# Patient Record
Sex: Male | Born: 1950 | Race: Black or African American | Hispanic: No | Smoking: Former smoker
Health system: Southern US, Community
[De-identification: ages and names within clinical notes are randomized; demographics above are authoritative.]

## PROBLEM LIST (undated history)

## (undated) DIAGNOSIS — I5040 Unspecified combined systolic (congestive) and diastolic (congestive) heart failure: Secondary | ICD-10-CM

## (undated) DIAGNOSIS — I472 Ventricular tachycardia: Secondary | ICD-10-CM

## (undated) DIAGNOSIS — J449 Chronic obstructive pulmonary disease, unspecified: Secondary | ICD-10-CM

## (undated) DIAGNOSIS — I4729 Other ventricular tachycardia: Secondary | ICD-10-CM

## (undated) DIAGNOSIS — Z8709 Personal history of other diseases of the respiratory system: Secondary | ICD-10-CM

## (undated) DIAGNOSIS — I420 Dilated cardiomyopathy: Secondary | ICD-10-CM

## (undated) DIAGNOSIS — C3490 Malignant neoplasm of unspecified part of unspecified bronchus or lung: Secondary | ICD-10-CM

## (undated) DIAGNOSIS — I5021 Acute systolic (congestive) heart failure: Secondary | ICD-10-CM

## (undated) DIAGNOSIS — E7800 Pure hypercholesterolemia, unspecified: Secondary | ICD-10-CM

## (undated) DIAGNOSIS — I2510 Atherosclerotic heart disease of native coronary artery without angina pectoris: Secondary | ICD-10-CM

## (undated) HISTORY — PX: HERNIA REPAIR: SHX51

## (undated) HISTORY — DX: Atherosclerotic heart disease of native coronary artery without angina pectoris: I25.10

## (undated) HISTORY — DX: Dilated cardiomyopathy: I42.0

## (undated) HISTORY — DX: Malignant neoplasm of unspecified part of unspecified bronchus or lung: C34.90

## (undated) HISTORY — DX: Acute systolic (congestive) heart failure: I50.21

## (undated) HISTORY — PX: OTHER SURGICAL HISTORY: SHX169

## (undated) HISTORY — PX: REPLANTATION THUMB: SUR1233

## (undated) HISTORY — DX: Ventricular tachycardia: I47.2

## (undated) HISTORY — DX: Personal history of other diseases of the respiratory system: Z87.09

## (undated) HISTORY — DX: Other ventricular tachycardia: I47.29

---

## 2016-03-29 DIAGNOSIS — C3490 Malignant neoplasm of unspecified part of unspecified bronchus or lung: Secondary | ICD-10-CM

## 2016-03-29 HISTORY — DX: Malignant neoplasm of unspecified part of unspecified bronchus or lung: C34.90

## 2017-11-13 ENCOUNTER — Emergency Department (HOSPITAL_COMMUNITY): Payer: Medicare HMO

## 2017-11-13 ENCOUNTER — Other Ambulatory Visit: Payer: Self-pay

## 2017-11-13 ENCOUNTER — Emergency Department (HOSPITAL_COMMUNITY)
Admission: EM | Admit: 2017-11-13 | Discharge: 2017-11-13 | Disposition: A | Discharge status: Home / Self Care | Payer: Medicare HMO | Attending: Emergency Medicine | Admitting: Emergency Medicine

## 2017-11-13 ENCOUNTER — Encounter (HOSPITAL_COMMUNITY): Payer: Self-pay | Admitting: *Deleted

## 2017-11-13 DIAGNOSIS — F17210 Nicotine dependence, cigarettes, uncomplicated: Secondary | ICD-10-CM | POA: Diagnosis not present

## 2017-11-13 DIAGNOSIS — Y9389 Activity, other specified: Secondary | ICD-10-CM | POA: Insufficient documentation

## 2017-11-13 DIAGNOSIS — Y929 Unspecified place or not applicable: Secondary | ICD-10-CM | POA: Insufficient documentation

## 2017-11-13 DIAGNOSIS — Y998 Other external cause status: Secondary | ICD-10-CM | POA: Diagnosis not present

## 2017-11-13 DIAGNOSIS — S4992XA Unspecified injury of left shoulder and upper arm, initial encounter: Secondary | ICD-10-CM | POA: Diagnosis present

## 2017-11-13 DIAGNOSIS — S42035A Nondisplaced fracture of lateral end of left clavicle, initial encounter for closed fracture: Secondary | ICD-10-CM | POA: Insufficient documentation

## 2017-11-13 DIAGNOSIS — W19XXXA Unspecified fall, initial encounter: Secondary | ICD-10-CM

## 2017-11-13 DIAGNOSIS — R55 Syncope and collapse: Secondary | ICD-10-CM | POA: Diagnosis not present

## 2017-11-13 MED ORDER — OXYCODONE-ACETAMINOPHEN 5-325 MG PO TABS
1.0000 | ORAL_TABLET | Freq: Once | ORAL | Status: AC
  Administered 2017-11-13: 1 via ORAL
  Filled 2017-11-13: qty 1

## 2017-11-13 MED ORDER — IBUPROFEN 600 MG PO TABS: 600.0000 mg | ORAL_TABLET | Freq: Four times a day (QID) | ORAL | 0 refills | Status: DC | PRN

## 2017-11-13 MED ORDER — HYDROCODONE-ACETAMINOPHEN 5-325 MG PO TABS: 1.0000 | ORAL_TABLET | ORAL | 0 refills | Status: DC | PRN

## 2017-11-13 NOTE — Discharge Instructions (Signed)
Follow up with your primary care physician for recheck in 1-2 weeks. Take ibuprofen for pain and Norco as needed for severe pain.

## 2017-11-13 NOTE — ED Notes (Signed)
Pt given [place] sandwich x2 and ginger ale
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
## 2017-11-13 NOTE — ED Notes (Signed)
E-signature not available, pt verbalized understanding of DC instructions and prescriptions 
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
## 2018-03-25 ENCOUNTER — Ambulatory Visit (HOSPITAL_COMMUNITY)
Admission: RE | Admit: 2018-03-25 | Discharge: 2018-03-25 | Disposition: A | Discharge status: Home / Self Care | Payer: Medicare HMO | Source: Ambulatory Visit | Attending: Internal Medicine | Admitting: Internal Medicine

## 2018-03-25 ENCOUNTER — Other Ambulatory Visit (HOSPITAL_COMMUNITY): Payer: Self-pay | Admitting: Internal Medicine

## 2018-03-25 DIAGNOSIS — C3491 Malignant neoplasm of unspecified part of right bronchus or lung: Secondary | ICD-10-CM

## 2018-04-01 ENCOUNTER — Other Ambulatory Visit: Payer: Self-pay | Admitting: Internal Medicine

## 2018-04-01 DIAGNOSIS — C3491 Malignant neoplasm of unspecified part of right bronchus or lung: Secondary | ICD-10-CM

## 2018-04-05 ENCOUNTER — Ambulatory Visit
Admission: RE | Admit: 2018-04-05 | Discharge: 2018-04-05 | Disposition: A | Discharge status: Home / Self Care | Payer: Medicare HMO | Source: Ambulatory Visit | Attending: Internal Medicine | Admitting: Internal Medicine

## 2018-04-05 DIAGNOSIS — C3491 Malignant neoplasm of unspecified part of right bronchus or lung: Secondary | ICD-10-CM

## 2018-04-05 MED ORDER — IOPAMIDOL (ISOVUE-300) INJECTION 61%
75.0000 mL | Freq: Once | INTRAVENOUS | Status: AC | PRN
  Administered 2018-04-05: 75 mL via INTRAVENOUS

## 2018-04-18 ENCOUNTER — Encounter: Payer: Self-pay | Admitting: Internal Medicine

## 2018-04-26 ENCOUNTER — Other Ambulatory Visit: Payer: Self-pay | Admitting: Medical Oncology

## 2018-04-26 DIAGNOSIS — R590 Localized enlarged lymph nodes: Secondary | ICD-10-CM

## 2018-04-29 ENCOUNTER — Inpatient Hospital Stay: Payer: Medicare HMO | Attending: Internal Medicine | Admitting: Internal Medicine

## 2018-04-29 ENCOUNTER — Encounter: Payer: Self-pay | Admitting: Internal Medicine

## 2018-04-29 ENCOUNTER — Encounter: Payer: Self-pay | Admitting: *Deleted

## 2018-04-29 ENCOUNTER — Inpatient Hospital Stay: Payer: Medicare HMO

## 2018-04-29 ENCOUNTER — Other Ambulatory Visit: Payer: Self-pay

## 2018-04-29 DIAGNOSIS — F17200 Nicotine dependence, unspecified, uncomplicated: Secondary | ICD-10-CM | POA: Insufficient documentation

## 2018-04-29 DIAGNOSIS — R918 Other nonspecific abnormal finding of lung field: Secondary | ICD-10-CM | POA: Insufficient documentation

## 2018-04-29 DIAGNOSIS — Z79899 Other long term (current) drug therapy: Secondary | ICD-10-CM | POA: Diagnosis not present

## 2018-04-29 DIAGNOSIS — C3490 Malignant neoplasm of unspecified part of unspecified bronchus or lung: Secondary | ICD-10-CM | POA: Insufficient documentation

## 2018-04-29 DIAGNOSIS — N400 Enlarged prostate without lower urinary tract symptoms: Secondary | ICD-10-CM | POA: Diagnosis not present

## 2018-04-29 DIAGNOSIS — Z923 Personal history of irradiation: Secondary | ICD-10-CM | POA: Diagnosis not present

## 2018-04-29 DIAGNOSIS — R590 Localized enlarged lymph nodes: Secondary | ICD-10-CM

## 2018-04-29 DIAGNOSIS — Z9221 Personal history of antineoplastic chemotherapy: Secondary | ICD-10-CM | POA: Insufficient documentation

## 2018-04-29 LAB — CMP (CANCER CENTER ONLY)
ALT: 13 U/L (ref 0–44)
AST: 18 U/L (ref 15–41)
Albumin: 3.7 g/dL (ref 3.5–5.0)
Alkaline Phosphatase: 80 U/L (ref 38–126)
Anion gap: 8 (ref 5–15)
BUN: 15 mg/dL (ref 8–23)
CO2: 30 mmol/L (ref 22–32)
Calcium: 9.5 mg/dL (ref 8.9–10.3)
Chloride: 106 mmol/L (ref 98–111)
Creatinine: 1.04 mg/dL (ref 0.61–1.24)
GFR, Est AFR Am: >60 mL/min (ref >60)
GFR, Estimated: >60 mL/min (ref >60)
Glucose, Bld: 102 mg/dL — ABNORMAL HIGH (ref 70–99)
Potassium: 4.3 mmol/L (ref 3.5–5.1)
Sodium: 144 mmol/L (ref 135–145)
Total Bilirubin: 0.7 mg/dL (ref 0.3–1.2)
Total Protein: 7.3 g/dL (ref 6.5–8.1)

## 2018-04-29 LAB — CBC WITH DIFFERENTIAL (CANCER CENTER ONLY)
Abs Immature Granulocytes: 0.03 10*3/uL (ref 0.00–0.07)
Basophils Absolute: 0.1 10*3/uL (ref 0.0–0.1)
Basophils Relative: 1 %
Eosinophils Absolute: 0.2 10*3/uL (ref 0.0–0.5)
Eosinophils Relative: 2 %
HCT: 46 % (ref 39.0–52.0)
Hemoglobin: 15.2 g/dL (ref 13.0–17.0)
Immature Granulocytes: 1 %
Lymphocytes Relative: 24 %
Lymphs Abs: 1.6 10*3/uL (ref 0.7–4.0)
MCH: 29.5 pg (ref 26.0–34.0)
MCHC: 33 g/dL (ref 30.0–36.0)
MCV: 89.1 fL (ref 80.0–100.0)
Monocytes Absolute: 0.5 10*3/uL (ref 0.1–1.0)
Monocytes Relative: 7 %
Neutro Abs: 4.2 10*3/uL (ref 1.7–7.7)
Neutrophils Relative %: 65 %
Platelet Count: 336 10*3/uL (ref 150–400)
RBC: 5.16 MIL/uL (ref 4.22–5.81)
RDW: 15.3 % (ref 11.5–15.5)
WBC Count: 6.5 10*3/uL (ref 4.0–10.5)
nRBC: 0 % (ref 0.0–0.2)

## 2018-04-29 NOTE — Progress Notes (Signed)
Oncology Nurse Navigator Documentation  Oncology Nurse Navigator Flowsheets 04/29/2018  Navigator Location [facility]  Navigator Encounter Type Clinic/MDC/Patient came to see Dr. Julien Nordmann for a new patient appt today.  He does not have any records with him but states he was treated with lung cancer in [place] at [facility]. I was able to get patient to sign a release of information form today.  I called referring office to get more information on patients past treatment.  They did not have any records.  I called [facility] and was unable to reach cancer center offices. I will update Dr. Julien Nordmann.   Treatment Phase Abnormal Scans  Barriers/Navigation Needs Education;Coordination of Care  Education Other  Interventions Coordination of Care;Education  Coordination of Care Other  Acuity Level 3  Time Spent with Patient 60

## 2018-04-29 NOTE — Progress Notes (Signed)
[facility] [phone]   [phone]  CONSULT NOTE  REFERRING PHYSICIAN: Dr. Mila Palmer  REASON FOR CONSULTATION:  67 years old African-American male with lung cancer.  HPI Jared Stevens is a 67 y.o. male with past medical history significant for hernia repair as well as or surgery and long history of smoking.  The patient mention that he was diagnosed with a stage III non-small cell lung cancer, adenocarcinoma in [place].  He had work-up including CT scan of the chest as well as PET scan and bronchoscopy that confirmed the diagnosis of a stage III non-small cell lung cancer.  He had MRI of the brain performed at that time that showed no evidence of metastatic disease to the brain.  Unfortunately the patient came with no records of his previous diagnosis and treatment.  He underwent a course of concurrent chemoradiation with weekly carboplatin and paclitaxel for a total of 35 fractions of radiation and 7 weeks of chemotherapy.  Repeat imaging studies after his treatment showed response to the treatment but the patient was not a surgical candidate for resection.  He did not have any further staging work-up since that time.  He moved to [facility] in October 2018.  The patient mentioned that he has a fall in June 2019.  Imaging studies including CT scan of the head as well as the cervical spine performed on 11/13/2017 showed no acute intracranial process and there was moderate left frontal scalp hematoma.  There was acute versus subacute left zygomatic arch fracture and old right zygomatic arch fracture and right medial orbital blowout fractures.  In October 2019 the patient was seen by primary care physician Dr. Marvel Plan.  Because of the previous history of lung cancer chest x-ray was performed on March 25, 2018 and that showed right infrahilar/lower lobe opacity with volume loss likely related to the patient is known lung cancer  with suspected posttreatment changes.  This was followed by CT scan of the chest on April 05, 2018 and that showed borderline mediastinal and right hilar lymphadenopathy with apparent posttreatment changes in the perihilar right lung.  There was 3.5 x 4.5 cm central masslike opacity in the right lung. The patient was referred to me today for evaluation and recommendation regarding his condition.  When seen today he continues to have numbness and aching pain in the chest, legs as well as ankles.  He also has shortness of breath with exertion and mild cough with no hemoptysis.  He lost around 30 pounds in the last year.  The patient denied having any headache or visual changes. Family history significant for mother died from aneurysm and father died from cancer at age 17. The patient is a widow and has 21 children.  Many of them live in [place] but one daughter lives in [place].  He is currently retired and used to work as Cabin crew.  He has a history of smoking 1 pack/day for around 53 years and unfortunately he continues to smoke.  The patient also has a history of alcohol and drug abuse in the past but not recently. HPI  No past medical history on file.  Past Surgical History:  Procedure Laterality Date  . Arm Surgery    . HERNIA REPAIR    . REPLANTATION THUMB      No family history on file.  Social History Social History   Tobacco Use  . Smoking status: Current Every Day Smoker  .  Smokeless tobacco: Never Used  Substance Use Topics  . Alcohol use: Not Currently  . Drug use: Not Currently    No Known Allergies  Current Outpatient Medications  Medication Sig Dispense Refill  . atorvastatin (LIPITOR) 20 MG tablet     . diclofenac sodium (VOLTAREN) 1 % GEL APP 4 GRAMS TOPICALLY AA QID FOR 30 DAYS  1  . HYDROcodone-acetaminophen (NORCO/VICODIN) 5-325 MG tablet Take 1 tablet by mouth every 4 (four) hours as needed. 8 tablet 0  . ibuprofen (ADVIL,MOTRIN) 600 MG tablet Take  1 tablet (600 mg total) by mouth every 6 (six) hours as needed. 30 tablet 0  . latanoprost (XALATAN) 0.005 % ophthalmic solution INT 1 GTT AEY BID  11  . naproxen (NAPROSYN) 500 MG tablet TK 1 T PO IN THE MORNING WF AND 1 T IN THE EVE WF  1  . SPIRIVA HANDIHALER 18 MCG inhalation capsule SPC  3  . VENTOLIN HFA 108 (90 Base) MCG/ACT inhaler INHALE 1 PUFF PO Q 6 H PRN. FOR 30 DAYS.  3  . WIXELA INHUB 250-50 MCG/DOSE AEPB      No current facility-administered medications for this visit.     Review of Systems  Constitutional: positive for fatigue and weight loss Eyes: negative Ears, nose, mouth, throat, and face: negative Respiratory: positive for cough and dyspnea on exertion Cardiovascular: negative Gastrointestinal: negative Genitourinary:negative Integument/breast: negative Hematologic/lymphatic: negative Musculoskeletal:positive for arthralgias Neurological: negative Behavioral/Psych: negative Endocrine: negative Allergic/Immunologic: negative  Physical Exam  TFT:DDUKG, healthy, no distress, well nourished and well developed SKIN: skin color, texture, turgor are normal, no rashes or significant lesions HEAD: Normocephalic, No masses, lesions, tenderness or abnormalities EYES: normal, PERRLA, Conjunctiva are pink and non-injected EARS: External ears normal, Canals clear OROPHARYNX:no exudate, no erythema and lips, buccal mucosa, and tongue normal  NECK: supple, no adenopathy, no JVD LYMPH:  no palpable lymphadenopathy, no hepatosplenomegaly LUNGS: clear to auscultation , and palpation HEART: regular rate & rhythm, no murmurs and no gallops ABDOMEN:abdomen soft, non-tender, normal bowel sounds and no masses or organomegaly BACK: Back symmetric, no curvature., No CVA tenderness EXTREMITIES:no joint deformities, effusion, or inflammation, no edema  NEURO: alert & oriented x 3 with fluent speech, no focal motor/sensory deficits  PERFORMANCE STATUS: ECOG 1  LABORATORY  DATA: Lab Results  Component Value Date   WBC 6.5 04/29/2018   HGB 15.2 04/29/2018   HCT 46.0 04/29/2018   MCV 89.1 04/29/2018   PLT 336 04/29/2018      Chemistry      Component Value Date/Time   NA 144 04/29/2018 1348   K 4.3 04/29/2018 1348   CL 106 04/29/2018 1348   CO2 30 04/29/2018 1348   BUN 15 04/29/2018 1348   CREATININE 1.04 04/29/2018 1348      Component Value Date/Time   CALCIUM 9.5 04/29/2018 1348   ALKPHOS 80 04/29/2018 1348   AST 18 04/29/2018 1348   ALT 13 04/29/2018 1348   BILITOT 0.7 04/29/2018 1348       RADIOGRAPHIC STUDIES: Ct Chest W Contrast  Result Date: 04/05/2018 CLINICAL DATA:  Cough and shortness of breath.  Lung cancer. EXAM: CT CHEST WITH CONTRAST TECHNIQUE: Multidetector CT imaging of the chest was performed during intravenous contrast administration. CONTRAST:  96mL ISOVUE-300 IOPAMIDOL (ISOVUE-300) INJECTION 61% COMPARISON:  Chest x-ray 03/25/2018. No previous CT imaging of the chest available. FINDINGS: Cardiovascular: Heart size within normal limits. No substantial pericardial effusion. There is abdominal aortic atherosclerosis without aneurysm. Mediastinum/Nodes: 9 mm short axis  AP window lymph node associated with small subcarinal lymph node. 12 mm short axis lymph node identified in the right hilum. Lungs/Pleura: There is abnormal soft tissue in the right hilum with an apparent 3.5 x 4.4 cm central right lung mass. Bronchiectasis with bandlike airspace disease in the right parahilar lung may be related to prior radiation fibrosis. Moderate right pleural effusion evident. Centrilobular emphysema noted. Upper Abdomen: 11 mm well-defined low-density lesion in the posterior right liver is likely a cyst. Other scattered tiny hypodensities in the liver parenchyma are too small to characterize. 17 mm low-density lesion in the interpolar right kidney is incompletely visualized but may be a cyst. Musculoskeletal: No worrisome lytic or sclerotic osseous  abnormality. IMPRESSION: 1. Borderline mediastinal and right hilar lymphadenopathy with apparent post treatment changes in the parahilar right lung. 3.5 x 4.5 cm central masslike opacity in the right lung. Direct comparison to prior imaging studies recommended as recurrent disease a concern. 2.  Emphysema. ([id number]) Electronically Signed   By: Misty Stanley M.D.   On: 04/05/2018 17:10    ASSESSMENT: This is a very pleasant 67 years old African-American male with history of a stage IIIa non-small cell lung cancer, adenocarcinoma status post a course of concurrent chemoradiation with weekly carboplatin and paclitaxel completed in early 2018.  The patient has been in observation since that time. Recent imaging studies showed right perihilar right lung treatment changes as well as borderline mediastinal and right hilar lymphadenopathy as well as central masslike opacity in the right lung.   PLAN: I had a lengthy discussion with the patient today about his current condition and treatment options. Unfortunately I do not have any records of his previous treatment and diagnosis from [place].  I will request his records to be available for [place] for future management of his condition. The current CT scan of the chest showed suspicious mass in the right lung as well as right hilar and mediastinal lymphadenopathy concerning for disease recurrence or residual posttreatment changes. I recommended for the patient to have a PET scan performed for further evaluation of this lesion and to rule out any disease recurrence. I will arrange for the patient to come back for follow-up visit in 3 weeks for evaluation and more detailed discussion of his treatment options based on the PET scan results and the previous records from [place]. For smoking cessation, I strongly encouraged the patient to quit smoking. The patient was advised to call immediately if he has any concerning symptoms in the interval. The patient  voices understanding of current disease status and treatment options and is in agreement with the current care plan.  All questions were answered. The patient knows to call the clinic with any problems, questions or concerns. We can certainly see the patient much sooner if necessary.  Thank you so much for allowing me to participate in the care of Jared Stevens. I will continue to follow up the patient with you and assist in his care.  I spent 40 minutes counseling the patient face to face. The total time spent in the appointment was 60 minutes.  Disclaimer: This note was dictated with voice recognition software. Similar sounding words can inadvertently be transcribed and may not be corrected upon review.   Eilleen Kempf April 29, 2018, 3:00 PM

## 2018-04-30 ENCOUNTER — Telehealth: Payer: Self-pay | Admitting: Internal Medicine

## 2018-04-30 NOTE — Telephone Encounter (Signed)
Scheduled appt per 12/2 los - pt is aware of appt date and time .

## 2018-05-06 ENCOUNTER — Encounter: Payer: Medicare HMO | Attending: Internal Medicine | Admitting: Dietician

## 2018-05-06 ENCOUNTER — Encounter: Payer: Self-pay | Admitting: Dietician

## 2018-05-06 DIAGNOSIS — Z713 Dietary counseling and surveillance: Secondary | ICD-10-CM | POA: Diagnosis present

## 2018-05-06 DIAGNOSIS — E46 Unspecified protein-calorie malnutrition: Secondary | ICD-10-CM | POA: Diagnosis not present

## 2018-05-06 DIAGNOSIS — Z681 Body mass index (BMI) 19 or less, adult: Secondary | ICD-10-CM | POA: Insufficient documentation

## 2018-05-06 DIAGNOSIS — R634 Abnormal weight loss: Secondary | ICD-10-CM | POA: Insufficient documentation

## 2018-05-06 NOTE — Patient Instructions (Addendum)
Drink 1-2 Ensure or Boost or other supplement per day [employer] mixed in while milk is the least expensive. You can put the shake in the blender with a banana if you wish. Add peanut butter to increase the calories.  Aim for 3 meals and 2 snacks daily  [facility] [employer] [facility]):  [phone]

## 2018-05-06 NOTE — Progress Notes (Signed)
Medical Nutrition Therapy:  Appt start time: 0810 end time:  0915.   Assessment:  Primary concerns today: Patient is here today alone.  His brother brought him but does not want to be part of the appointment.  He was referred for nutritional deficiency, abnormal weight loss and protein calorie malnutrition.  He was diagnosed with non-small cell lung cancer in [place] in November 2017 and underwent chemo and radiation.  He is to have a PET scan to determine his current status.  6 months ago he fell and broke his collar bone.  Weight hx: 146 lbs today- reports that he is eating well now but money for food is an issue at times.  He lost 15% of his UBW in the past year and has since regained about 9 lbs.  BMI currently 19. Lost from 160 to 137 lbs since moving to [place].  States that he wasn't eating. 168 lbs in [place]  160-165 lbs in normal weight. 210 lbs (2004-2006) is his highest adult weight but did not feel comfortable at this weight.  He lost with "stopping eating". He states that he did not lose weight with chemo and radiation.  Nutrition Focused Physical Exam performed: Mild wasting at the temporal region Severe muscle loss at the scapula and top of the shoulder. 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  Patient lives alone and moved from [place] 1 year ago to be closer to his daughter. He does his own shopping and cooking and does drive. His food stamps were cut and only receives $43 per month.  He is a retired [employer] and does some on the side now for extra money.  He has 9 children.  He is a widow.  Preferred Learning Style:   No preference indicated   Learning Readiness:   MEDICATIONS: see list   DIETARY INTAKE:  Usual eating pattern includes 3 meals and 1-2 snacks per day.  Everyday foods include.  Avoided foods include pork chops.    24-hr recall:  B ( AM): SKIPS OR sausage, eggs, pancakes OR oatmeal OR  raisin bran with whole milk  Snk ( AM): none L ( PM): 1 sandwich (ham, [place], with mayo), chips, fruit Snk ( PM): Hershey bar D ( PM): pinto or lima beans with [place] wings, necks, cornbread, greens or other vegetables Snk ( PM): occasional pie and whole milk Beverages: coffee with sugar, water, whole milk, gatorade, sweet tea  Usual physical activity: at work or walking  Estimated energy needs: 2100-2200 calories 75-85 g protein  Progress Towards Goal(s):  In progress.   Nutritional Diagnosis:  [place]-3.2 Unintentional weight loss As related to stress, chronic illness, food insecurity.  As evidenced by patient report and patient history.    Intervention:  Nutrition counseling/education on tips to increase calories and nutrition.  Discussed importance of a regular meal and snack schedule and supplements as needed.  Discussed food banks, local free meals and senior resources.  Provided supplement samples and coupons.  Drink 1-2 Ensure or Boost or other supplement per day [employer] mixed in while milk is the least expensive. You can put the shake in the blender with a banana if you wish. Add peanut butter to increase the calories. Aim for 3 meals and 2 snacks daily  Senior Resource Line [facility]):  [phone]  Teaching Method Utilized:  Auditory  Barriers to learning/adherence to lifestyle change: finances  Demonstrated degree of understanding via:  Teach Back   Monitoring/Evaluation:  Dietary intake, exercise, and body weight prn.

## 2018-05-10 ENCOUNTER — Telehealth: Payer: Self-pay | Admitting: *Deleted

## 2018-05-10 ENCOUNTER — Encounter: Payer: Self-pay | Admitting: *Deleted

## 2018-05-10 DIAGNOSIS — R590 Localized enlarged lymph nodes: Secondary | ICD-10-CM

## 2018-05-10 NOTE — Progress Notes (Signed)
I received a call back from [place] in [place] and was given a fax number to send record request and release of information.  Fax completed

## 2018-05-10 NOTE — Telephone Encounter (Signed)
Oncology Nurse Navigator Documentation  Oncology Nurse Navigator Flowsheets 05/10/2018  Navigator Location [facility]  Navigator Encounter Type Telephone/I followed up on Jared Stevens schedule and noted his PET scan is not scheduled yet.  I called patient to clarify and he is not aware of radiology calling him to schedule.  I called central scheduling and was able to get him an appt for PET before he sees Dr. Julien Nordmann on 05/27/18.  I needs help with transportation so I contacted the cancer centers transportation coordinator for help.    Telephone Outgoing Call  Treatment Phase Abnormal Scans  Barriers/Navigation Needs Education;Coordination of Care  Education Other  Interventions Coordination of Care;Education  Coordination of Care Appts  Education Method Verbal  Acuity Level 2  Time Spent with Patient 30

## 2018-05-10 NOTE — Progress Notes (Signed)
Oncology Nurse Navigator Documentation  Oncology Nurse Navigator Flowsheets 05/10/2018  Navigator Location [facility]  Navigator Encounter Type Other/I called [facility] in [place] to get recent medical records.  I was unable to reach anyone but did leave a vm message for them to call me with my name and phone number.   Treatment Phase Abnormal Scans  Barriers/Navigation Needs Coordination of Care  Interventions Coordination of Care  Coordination of Care Other  Acuity Level 1  Time Spent with Patient 15

## 2018-05-13 ENCOUNTER — Telehealth: Payer: Self-pay | Admitting: Internal Medicine

## 2018-05-13 NOTE — Telephone Encounter (Signed)
Set patient up with rides to his appointments here at the cancer center. Will have him sign a waiver on 12/26.

## 2018-05-23 ENCOUNTER — Inpatient Hospital Stay: Payer: Medicare HMO

## 2018-05-23 ENCOUNTER — Ambulatory Visit (HOSPITAL_COMMUNITY)
Admission: RE | Admit: 2018-05-23 | Discharge: 2018-05-23 | Disposition: A | Discharge status: Home / Self Care | Payer: Medicare HMO | Source: Ambulatory Visit | Attending: Internal Medicine | Admitting: Internal Medicine

## 2018-05-23 DIAGNOSIS — C3490 Malignant neoplasm of unspecified part of unspecified bronchus or lung: Secondary | ICD-10-CM

## 2018-05-23 DIAGNOSIS — R590 Localized enlarged lymph nodes: Secondary | ICD-10-CM

## 2018-05-23 LAB — CMP (CANCER CENTER ONLY)
ALT: 36 U/L (ref 0–44)
AST: 23 U/L (ref 15–41)
Albumin: 3.5 g/dL (ref 3.5–5.0)
Alkaline Phosphatase: 98 U/L (ref 38–126)
Anion gap: 8 (ref 5–15)
BUN: 17 mg/dL (ref 8–23)
CO2: 27 mmol/L (ref 22–32)
Calcium: 9.1 mg/dL (ref 8.9–10.3)
Chloride: 109 mmol/L (ref 98–111)
Creatinine: 1.03 mg/dL (ref 0.61–1.24)
GFR, Est AFR Am: >60 mL/min (ref >60)
GFR, Estimated: >60 mL/min (ref >60)
Glucose, Bld: 78 mg/dL (ref 70–99)
Potassium: 3.4 mmol/L — ABNORMAL LOW (ref 3.5–5.1)
Sodium: 144 mmol/L (ref 135–145)
Total Bilirubin: 0.3 mg/dL (ref 0.3–1.2)
Total Protein: 7 g/dL (ref 6.5–8.1)

## 2018-05-23 LAB — GLUCOSE, CAPILLARY: Glucose-Capillary: 78 mg/dL (ref 70–99)

## 2018-05-23 LAB — CBC WITH DIFFERENTIAL (CANCER CENTER ONLY)
Abs Immature Granulocytes: 0.02 10*3/uL (ref 0.00–0.07)
Basophils Absolute: 0.1 10*3/uL (ref 0.0–0.1)
Basophils Relative: 1 %
Eosinophils Absolute: 0.2 10*3/uL (ref 0.0–0.5)
Eosinophils Relative: 3 %
HCT: 45.4 % (ref 39.0–52.0)
Hemoglobin: 15.1 g/dL (ref 13.0–17.0)
Immature Granulocytes: 0 %
Lymphocytes Relative: 26 %
Lymphs Abs: 1.9 10*3/uL (ref 0.7–4.0)
MCH: 29.4 pg (ref 26.0–34.0)
MCHC: 33.3 g/dL (ref 30.0–36.0)
MCV: 88.5 fL (ref 80.0–100.0)
Monocytes Absolute: 0.6 10*3/uL (ref 0.1–1.0)
Monocytes Relative: 8 %
Neutro Abs: 4.6 10*3/uL (ref 1.7–7.7)
Neutrophils Relative %: 62 %
Platelet Count: 318 10*3/uL (ref 150–400)
RBC: 5.13 MIL/uL (ref 4.22–5.81)
RDW: 14.8 % (ref 11.5–15.5)
WBC Count: 7.4 10*3/uL (ref 4.0–10.5)
nRBC: 0 % (ref 0.0–0.2)

## 2018-05-23 MED ORDER — FLUDEOXYGLUCOSE F - 18 (FDG) INJECTION
7.2600 | Freq: Once | INTRAVENOUS | Status: AC
  Administered 2018-05-23: 7.26 via INTRAVENOUS

## 2018-05-27 ENCOUNTER — Encounter: Payer: Self-pay | Admitting: Internal Medicine

## 2018-05-27 ENCOUNTER — Inpatient Hospital Stay (HOSPITAL_BASED_OUTPATIENT_CLINIC_OR_DEPARTMENT_OTHER): Payer: Medicare HMO | Admitting: Internal Medicine

## 2018-05-27 ENCOUNTER — Telehealth: Payer: Self-pay | Admitting: Internal Medicine

## 2018-05-27 DIAGNOSIS — R918 Other nonspecific abnormal finding of lung field: Secondary | ICD-10-CM

## 2018-05-27 DIAGNOSIS — N400 Enlarged prostate without lower urinary tract symptoms: Secondary | ICD-10-CM | POA: Diagnosis not present

## 2018-05-27 DIAGNOSIS — C3490 Malignant neoplasm of unspecified part of unspecified bronchus or lung: Secondary | ICD-10-CM

## 2018-05-27 DIAGNOSIS — F17200 Nicotine dependence, unspecified, uncomplicated: Secondary | ICD-10-CM | POA: Diagnosis not present

## 2018-05-27 DIAGNOSIS — Z79899 Other long term (current) drug therapy: Secondary | ICD-10-CM

## 2018-05-27 DIAGNOSIS — Z9221 Personal history of antineoplastic chemotherapy: Secondary | ICD-10-CM

## 2018-05-27 DIAGNOSIS — Z923 Personal history of irradiation: Secondary | ICD-10-CM

## 2018-05-27 NOTE — Progress Notes (Signed)
[facility] [phone]   [phone]  OFFICE PROGRESS NOTE  Patient, No Pcp Per No address on file  DIAGNOSIS: Stage IIIA non-small cell lung cancer, adenocarcinoma   PRIOR THERAPY: status post a course of concurrent chemoradiation with weekly carboplatin and paclitaxel completed in early 2018.  The patient has been in observation since that time.  CURRENT THERAPY: Observation.  INTERVAL HISTORY: Jared Stevens 67 y.o. male returns to the clinic today for follow-up visit.  The patient is feeling fine today with no concerning complaints except for intermittent low back pain.  He denied having any chest pain but has shortness of breath with exertion with no cough or hemoptysis.  He has no recent weight loss or night sweats.  He has no nausea, vomiting, diarrhea or constipation.  He was found on previous CT scan of the chest to have suspicious lesion and the right lung.  I ordered a PET scan which was performed recently and the patient is here today for evaluation and discussion of his PET scan results and recommendation regarding his condition.  MEDICAL HISTORY: Past Medical History:  Diagnosis Date  . Lung cancer ([place]) 03/2016    ALLERGIES:  has No Known Allergies.  MEDICATIONS:  Current Outpatient Medications  Medication Sig Dispense Refill  . atorvastatin (LIPITOR) 20 MG tablet     . diclofenac sodium (VOLTAREN) 1 % GEL APP 4 GRAMS TOPICALLY AA QID FOR 30 DAYS  1  . HYDROcodone-acetaminophen (NORCO/VICODIN) 5-325 MG tablet Take 1 tablet by mouth every 4 (four) hours as needed. (Patient not taking: Reported on 05/06/2018) 8 tablet 0  . ibuprofen (ADVIL,MOTRIN) 600 MG tablet Take 1 tablet (600 mg total) by mouth every 6 (six) hours as needed. (Patient not taking: Reported on 05/06/2018) 30 tablet 0  . latanoprost (XALATAN) 0.005 % ophthalmic solution INT 1 GTT AEY BID  11  . naproxen (NAPROSYN) 500 MG tablet TK 1 T PO IN THE MORNING WF AND 1 T IN  THE EVE WF  1  . SPIRIVA HANDIHALER 18 MCG inhalation capsule SPC  3  . VENTOLIN HFA 108 (90 Base) MCG/ACT inhaler INHALE 1 PUFF PO Q 6 H PRN. FOR 30 DAYS.  3  . WIXELA INHUB 250-50 MCG/DOSE AEPB      No current facility-administered medications for this visit.     SURGICAL HISTORY:  Past Surgical History:  Procedure Laterality Date  . Arm Surgery    . HERNIA REPAIR    . REPLANTATION THUMB      REVIEW OF SYSTEMS:  A comprehensive review of systems was negative except for: Constitutional: positive for fatigue Respiratory: positive for dyspnea on exertion Musculoskeletal: positive for back pain   PHYSICAL EXAMINATION: General appearance: alert, cooperative, fatigued and no distress Head: Normocephalic, without obvious abnormality, atraumatic Neck: no adenopathy, no JVD, supple, symmetrical, trachea midline and thyroid not enlarged, symmetric, no tenderness/mass/nodules Lymph nodes: Cervical, supraclavicular, and axillary nodes normal. Resp: clear to auscultation bilaterally Back: symmetric, no curvature. ROM normal. No CVA tenderness. Cardio: regular rate and rhythm, S1, S2 normal, no murmur, click, rub or gallop GI: soft, non-tender; bowel sounds normal; no masses,  no organomegaly Extremities: extremities normal, atraumatic, no cyanosis or edema  ECOG PERFORMANCE STATUS: 1 - Symptomatic but completely ambulatory  Blood pressure 134/89, pulse (!) 101, temperature 97.7 F (36.5 C), temperature source Oral, resp. rate 17, height 6\' 1"  (1.854 m), weight 148 lb 8 oz (67.4 kg), SpO2 98 %.  LABORATORY  DATA: Lab Results  Component Value Date   WBC 7.4 05/23/2018   HGB 15.1 05/23/2018   HCT 45.4 05/23/2018   MCV 88.5 05/23/2018   PLT 318 05/23/2018      Chemistry      Component Value Date/Time   NA 144 05/23/2018 0822   K 3.4 (L) 05/23/2018 0822   CL 109 05/23/2018 0822   CO2 27 05/23/2018 0822   BUN 17 05/23/2018 0822   CREATININE 1.03 05/23/2018 0822      Component  Value Date/Time   CALCIUM 9.1 05/23/2018 0822   ALKPHOS 98 05/23/2018 0822   AST 23 05/23/2018 0822   ALT 36 05/23/2018 0822   BILITOT 0.3 05/23/2018 0822       RADIOGRAPHIC STUDIES: Nm Pet Image Restag (ps) Skull Base To Thigh  Result Date: 05/23/2018 CLINICAL DATA:  Subsequent treatment strategy for lung carcinoma. Non small cell carcinoma EXAM: NUCLEAR MEDICINE PET SKULL BASE TO THIGH TECHNIQUE: 7.26 mCi F-18 FDG was injected intravenously. Full-ring PET imaging was performed from the skull base to thigh after the radiotracer. CT data was obtained and used for attenuation correction and anatomic localization. Fasting blood glucose: 74 mg/dl COMPARISON:  04/05/2018 FINDINGS: Mediastinal blood pool activity: SUV max 1.56 NECK: No hypermetabolic lymph nodes in the neck. Incidental CT findings: none CHEST: Large volume of pleural fluid in the RIGHT hemithorax not changed from prior. There is atelectasis of the RIGHT middle lobe. There is rounded mass along the RIGHT mediastinal border measuring 4.2 by 3.5 cm. This lesion has peripheral high density and no significant metabolic activity most suggestive of rounded atelectasis. There are no hypermetabolic hilar lymph nodes. No hypermetabolic mediastinal lymph nodes. No hypermetabolic supraclavicular nodes Incidental CT findings: none ABDOMEN/PELVIS: There is diffuse hypermetabolic activity through the gastric fundus, body and antrum without focal lesion on the CT portion exam. Activity is intense with SUV max equal 7.7. No abnormal hypermetabolic activity within the liver, pancreas, adrenal glands, or spleen. No hypermetabolic lymph nodes in the abdomen or pelvis. Incidental CT findings: Prostate is enlarged SKELETON: No focal hypermetabolic activity to suggest skeletal metastasis. Incidental CT findings: none IMPRESSION: 1. No evidence lung cancer recurrence. 2. Rounded mass along the RIGHT mediastinum has absent metabolic activity and favored round  atelectasis. No metastatic mediastinal adenopathy. 3. Large layering RIGHT pleural effusion and RIGHT middle lobe atelectasis. 4. Diffuse hypermetabolic activity throughout the stomach without mass lesion on CT. Favor gastritis over malignancy. Electronically Signed   By: Suzy Bouchard M.D.   On: 05/23/2018 11:23    ASSESSMENT AND PLAN: This is a very pleasant 67 years old African-American male with history of a stage IIIa non-small cell lung cancer, adenocarcinoma status post a course of concurrent chemoradiation in [place] in 2017. The patient is currently on observation.  Recent CT scan of the chest showed borderline mediastinal and right hilar lymphadenopathy as well as right lung central masslike opacity. I ordered a PET scan for evaluation of this lesion.  I personally and independently reviewed the scan images and discussed the result with the patient today.  His a scan showed no evidence for lung cancer recurrence and there was rounded mass along the right mediastinum with absent metabolic activity favored round atelectasis.  There was no metastatic mediastinal lymphadenopathy. I recommended for the patient to continue on observation with repeat CT scan of the chest in 3 months for further evaluation of his disease. The patient was advised to call immediately if he has any concerning  symptoms in the interval. The patient voices understanding of current disease status and treatment options and is in agreement with the current care plan.  All questions were answered. The patient knows to call the clinic with any problems, questions or concerns. We can certainly see the patient much sooner if necessary.  I spent 10 minutes counseling the patient face to face. The total time spent in the appointment was 15 minutes.  Disclaimer: This note was dictated with voice recognition software. Similar sounding words can inadvertently be transcribed and may not be corrected upon review.

## 2018-05-27 NOTE — Telephone Encounter (Signed)
Gave patient avs report and appointments for March. Central radiology will call re scan.

## 2018-06-23 ENCOUNTER — Ambulatory Visit (HOSPITAL_COMMUNITY)
Admission: EM | Admit: 2018-06-23 | Discharge: 2018-06-23 | Disposition: A | Discharge status: Home / Self Care | Payer: Medicare HMO | Attending: Family Medicine | Admitting: Family Medicine

## 2018-06-23 ENCOUNTER — Other Ambulatory Visit: Payer: Self-pay

## 2018-06-23 ENCOUNTER — Encounter (HOSPITAL_COMMUNITY): Payer: Self-pay | Admitting: *Deleted

## 2018-06-23 ENCOUNTER — Ambulatory Visit (INDEPENDENT_AMBULATORY_CARE_PROVIDER_SITE_OTHER): Payer: Medicare HMO

## 2018-06-23 DIAGNOSIS — J441 Chronic obstructive pulmonary disease with (acute) exacerbation: Secondary | ICD-10-CM | POA: Diagnosis not present

## 2018-06-23 DIAGNOSIS — Z85118 Personal history of other malignant neoplasm of bronchus and lung: Secondary | ICD-10-CM | POA: Diagnosis not present

## 2018-06-23 HISTORY — DX: Pure hypercholesterolemia, unspecified: E78.00

## 2018-06-23 HISTORY — DX: Chronic obstructive pulmonary disease, unspecified: J44.9

## 2018-06-23 MED ORDER — IPRATROPIUM-ALBUTEROL 0.5-2.5 (3) MG/3ML IN SOLN
3.0000 mL | Freq: Once | RESPIRATORY_TRACT | Status: AC
  Administered 2018-06-23: 3 mL via RESPIRATORY_TRACT

## 2018-06-23 MED ORDER — PREDNISONE 10 MG PO TABS: 20.0000 mg | ORAL_TABLET | Freq: Every day | ORAL | 0 refills | Status: DC

## 2018-06-23 MED ORDER — ACETAMINOPHEN 325 MG PO TABS
ORAL_TABLET | ORAL | Status: AC
  Filled 2018-06-23: qty 2

## 2018-06-23 MED ORDER — IPRATROPIUM-ALBUTEROL 0.5-2.5 (3) MG/3ML IN SOLN
RESPIRATORY_TRACT | Status: AC
  Filled 2018-06-23: qty 3

## 2018-06-23 MED ORDER — DOXYCYCLINE HYCLATE 100 MG PO CAPS: 100.0000 mg | ORAL_CAPSULE | Freq: Two times a day (BID) | ORAL | 0 refills | Status: DC

## 2018-06-23 MED ORDER — ACETAMINOPHEN 325 MG PO TABS
650.0000 mg | ORAL_TABLET | Freq: Once | ORAL | Status: AC
  Administered 2018-06-23: 650 mg via ORAL

## 2018-06-23 NOTE — ED Triage Notes (Signed)
C/O cough, congestion, SOB with exertion x 3 days.  Unsure if fevers.  Hx COPD without O2 use.  Audible wheezing noted.

## 2018-06-23 NOTE — ED Notes (Signed)
Providers notified of pt.

## 2018-06-23 NOTE — ED Provider Notes (Signed)
[facility]    CSN: [id number] Arrival date & time: 06/23/18  1111     History   Chief Complaint Chief Complaint  Patient presents with  . Cough  . Nasal Congestion    HPI Jared Stevens is a 68 y.o. male.   Patient has a history of COPD as well as lung cancer.  Had been on albuterol as well as a lab.  Cough is productive of sputum is white.  Denies fever or chills.  Chief complaint is just increasing shortness of breath.  HPI  Past Medical History:  Diagnosis Date  . COPD (chronic obstructive pulmonary disease) ([place])   . Hypercholesteremia   . Lung cancer ([place]) 03/2016    There are no active problems to display for this patient.   Past Surgical History:  Procedure Laterality Date  . Arm Surgery    . HERNIA REPAIR    . REPLANTATION THUMB         Home Medications    Prior to Admission medications   Medication Sig Start Date End Date Taking? Authorizing Provider  atorvastatin (LIPITOR) 20 MG tablet  04/22/18  Yes [provider]  latanoprost (XALATAN) 0.005 % ophthalmic solution INT 1 GTT AEY BID 03/12/18  Yes [provider]  SPIRIVA HANDIHALER 18 MCG inhalation capsule [facility] 04/10/18  Yes [provider]  VENTOLIN HFA 108 (90 Base) MCG/ACT inhaler INHALE 1 PUFF PO Q 6 H PRN. FOR 30 DAYS. 04/11/18  Yes [provider]  diclofenac sodium (VOLTAREN) 1 % GEL APP 4 GRAMS TOPICALLY AA QID FOR 30 DAYS 04/01/18   [provider]  HYDROcodone-acetaminophen (NORCO/VICODIN) 5-325 MG tablet Take 1 tablet by mouth every 4 (four) hours as needed. Patient not taking: Reported on 05/06/2018 11/13/17   Charlann Lange, PA-C  ibuprofen (ADVIL,MOTRIN) 600 MG tablet Take 1 tablet (600 mg total) by mouth every 6 (six) hours as needed. Patient not taking: Reported on 05/06/2018 11/13/17   Charlann Lange, PA-C  naproxen (NAPROSYN) 500 MG tablet TK 1 T PO IN THE MORNING WF AND 1 T IN THE EVE WF 04/11/18   [provider]  Delfin Edis 250-50 MCG/DOSE AEPB  04/22/18   [provider]    Family History History reviewed. No pertinent family history.  Social History Social History   Tobacco Use  . Smoking status: Current Some Day Smoker  . Smokeless tobacco: Never Used  Substance Use Topics  . Alcohol use: Not Currently  . Drug use: Not Currently     Allergies   Patient has no known allergies.   Review of Systems Review of Systems  Constitutional: Negative.   Respiratory: Positive for cough and shortness of breath.      Physical Exam Triage Vital Signs ED Triage Vitals  Enc Vitals Group     BP 06/23/18 1205 134/88     Pulse Rate 06/23/18 1205 (!) 114     Resp 06/23/18 1205 (!) 22     Temp 06/23/18 1205 (!) 101.9 F (38.8 C)     Temp Source 06/23/18 1205 Temporal     SpO2 06/23/18 1205 91 %     Weight --      Height --      Head Circumference --      Peak Flow --      Pain Score 06/23/18 1206 8     Pain Loc --      Pain Edu? --      Excl. in [place]? --  No data found.  Updated Vital Signs BP 134/88   Pulse (!) 114   Temp (!) 101.9 F (38.8 C) (Temporal)   Resp (!) 22   SpO2 91%   Visual Acuity Right Eye Distance:   Left Eye Distance:   Bilateral Distance:    Right Eye Near:   Left Eye Near:    Bilateral Near:     Physical Exam Constitutional:      Appearance: Normal appearance. He is ill-appearing.  HENT:     Nose: Nose normal.     Mouth/Throat:     Pharynx: Oropharynx is clear.  Cardiovascular:     Rate and Rhythm: Normal rate and regular rhythm.  Pulmonary:     Effort: Pulmonary effort is normal.     Breath sounds: Wheezing and rhonchi present.  Abdominal:     General: Bowel sounds are normal.     Palpations: Abdomen is soft.  Neurological:     Mental Status: He is alert.      UC Treatments / Results  Labs (all labs ordered are listed, but only abnormal results are displayed) Labs Reviewed - No data to display  EKG None  Radiology No  results found.  Procedures Procedures (including critical care time)  Medications Ordered in UC Medications  acetaminophen (TYLENOL) tablet 650 mg (has no administration in time range)  ipratropium-albuterol (DUONEB) 0.5-2.5 (3) MG/3ML nebulizer solution 3 mL (has no administration in time range)    Initial Impression / Assessment and Plan / UC Course  I have reviewed the triage vital signs and the nursing notes.  Pertinent labs & imaging results that were available during my care of the patient were reviewed by me and considered in my medical decision making (see chart for details).     COPD exacerbation.  With fever was worried about pneumonia but x-ray fails to demonstrate.  We will go ahead and cover with antibiotic and steroids.  Discussed with patient possibility of hospitalization and he prefers not to go that route Final Clinical Impressions(s) / UC Diagnoses   Final diagnoses:  None   Discharge Instructions   None    ED Prescriptions    None     Controlled Substance Prescriptions [place] Controlled Substance Registry consulted? No   Wardell Honour, MD 06/23/18 1314

## 2018-08-20 ENCOUNTER — Other Ambulatory Visit: Payer: Self-pay

## 2018-08-20 ENCOUNTER — Ambulatory Visit (HOSPITAL_COMMUNITY)
Admission: EM | Admit: 2018-08-20 | Discharge: 2018-08-20 | Disposition: A | Discharge status: Home / Self Care | Payer: Medicare HMO | Attending: Family Medicine | Admitting: Family Medicine

## 2018-08-20 ENCOUNTER — Encounter (HOSPITAL_COMMUNITY): Payer: Self-pay

## 2018-08-20 DIAGNOSIS — T451X5A Adverse effect of antineoplastic and immunosuppressive drugs, initial encounter: Secondary | ICD-10-CM

## 2018-08-20 DIAGNOSIS — J441 Chronic obstructive pulmonary disease with (acute) exacerbation: Secondary | ICD-10-CM

## 2018-08-20 DIAGNOSIS — F17210 Nicotine dependence, cigarettes, uncomplicated: Secondary | ICD-10-CM | POA: Diagnosis not present

## 2018-08-20 DIAGNOSIS — G620 Drug-induced polyneuropathy: Secondary | ICD-10-CM

## 2018-08-20 DIAGNOSIS — Z9189 Other specified personal risk factors, not elsewhere classified: Secondary | ICD-10-CM

## 2018-08-20 MED ORDER — VARENICLINE TARTRATE 0.5 MG PO TABS: 0.5000 mg | ORAL_TABLET | Freq: Two times a day (BID) | ORAL | 3 refills | Status: DC

## 2018-08-20 MED ORDER — DEXAMETHASONE 1 MG/ML PO CONC
10.0000 mg | Freq: Once | ORAL | Status: AC
  Administered 2018-08-20: 10 mg via ORAL

## 2018-08-20 MED ORDER — AZITHROMYCIN 250 MG PO TABS
1000.0000 mg | ORAL_TABLET | Freq: Once | ORAL | Status: AC
  Administered 2018-08-20: 1000 mg via ORAL

## 2018-08-20 MED ORDER — AZITHROMYCIN 250 MG PO TABS
ORAL_TABLET | ORAL | Status: AC
  Filled 2018-08-20: qty 4

## 2018-08-20 MED ORDER — DEXAMETHASONE SODIUM PHOSPHATE 10 MG/ML IJ SOLN
INTRAMUSCULAR | Status: AC
  Filled 2018-08-20: qty 1

## 2018-08-20 NOTE — ED Provider Notes (Signed)
[place]    CSN: [id number] Arrival date & time: 08/20/18  1546     History   Chief Complaint Chief Complaint  Patient presents with  . Cough    HPI Jared Stevens is a 68 y.o. male.   68 year old man who is been seen here 2 months ago for an exacerbation of his COPD.  At that time he was treated with steroids and antibiotics.  Presents today with 4 days of progressive cough.  He called his primary care doctor who told him to take Mucinex and if not better, come to urgent care.  He has had no fever or sore throat.  He has had no chest pain.  The cough is productive and he is wheezing more than usual.  Patient has a history of adenocarcinoma of the right middle lobe.  This was diagnosed 3 years ago.  He also notes that he has numbness in both hands since he finished his chemotherapy and broke his collarbone last year.  Patient would like to quit smoking.  He is requesting a prescription for Chantix.      Past Medical History:  Diagnosis Date  . COPD (chronic obstructive pulmonary disease) ([place])   . Hypercholesteremia   . Lung cancer ([place]) 03/2016    There are no active problems to display for this patient.   Past Surgical History:  Procedure Laterality Date  . Arm Surgery    . HERNIA REPAIR    . REPLANTATION THUMB         Home Medications    Prior to Admission medications   Medication Sig Start Date End Date Taking? Authorizing Provider  atorvastatin (LIPITOR) 20 MG tablet  04/22/18   [provider]  diclofenac sodium (VOLTAREN) 1 % GEL APP 4 GRAMS TOPICALLY AA QID FOR 30 DAYS 04/01/18   [provider]  doxycycline (VIBRAMYCIN) 100 MG capsule Take 1 capsule (100 mg total) by mouth 2 (two) times daily. 06/23/18   Wardell Honour, MD  HYDROcodone-acetaminophen (NORCO/VICODIN) 5-325 MG tablet Take 1 tablet by mouth every 4 (four) hours as needed. Patient not taking: Reported on 05/06/2018 11/13/17   Charlann Lange, PA-C  ibuprofen  (ADVIL,MOTRIN) 600 MG tablet Take 1 tablet (600 mg total) by mouth every 6 (six) hours as needed. Patient not taking: Reported on 05/06/2018 11/13/17   Charlann Lange, PA-C  latanoprost (XALATAN) 0.005 % ophthalmic solution INT 1 GTT AEY BID 03/12/18   [provider]  naproxen (NAPROSYN) 500 MG tablet TK 1 T PO IN THE MORNING WF AND 1 T IN THE EVE WF 04/11/18   [provider]  predniSONE (DELTASONE) 10 MG tablet Take 2 tablets (20 mg total) by mouth daily. 06/23/18   Wardell Honour, MD  SPIRIVA HANDIHALER 18 MCG inhalation capsule [facility] 04/10/18   [provider]  varenicline (CHANTIX) 0.5 MG tablet Take 1 tablet (0.5 mg total) by mouth 2 (two) times daily. 08/20/18   Robyn Haber, MD  VENTOLIN HFA 108 (90 Base) MCG/ACT inhaler INHALE 1 PUFF PO Q 6 H PRN. FOR 30 DAYS. 04/11/18   [provider]  Delfin Edis 952-84 MCG/DOSE AEPB  04/22/18   [provider]    Family History History reviewed. No pertinent family history.  Social History Social History   Tobacco Use  . Smoking status: Current Some Day Smoker  . Smokeless tobacco: Never Used  Substance Use Topics  . Alcohol use: Not Currently  . Drug use: Not Currently  Allergies   Patient has no known allergies.   Review of Systems Review of Systems  Constitutional: Positive for appetite change, fatigue and unexpected weight change. Negative for activity change, chills, diaphoresis and fever.  HENT: Negative.   Respiratory: Positive for cough and wheezing.   Cardiovascular: Negative for chest pain.  Gastrointestinal: Negative.   Genitourinary: Negative.   Neurological: Positive for numbness.  All other systems reviewed and are negative.    Physical Exam Triage Vital Signs ED Triage Vitals  Enc Vitals Group     BP 08/20/18 1606 (!) 126/96     Pulse Rate 08/20/18 1606 (!) 109     Resp 08/20/18 1606 18     Temp 08/20/18 1606 97.7 F (36.5 C)     Temp Source 08/20/18  1606 Oral     SpO2 08/20/18 1606 100 %     Weight 08/20/18 1605 150 lb (68 kg)     Height --      Head Circumference --      Peak Flow --      Pain Score 08/20/18 1605 1     Pain Loc --      Pain Edu? --      Excl. in [place]? --    No data found.  Updated Vital Signs BP (!) 126/96 (BP Location: Left Arm)   Pulse (!) 109   Temp 97.7 F (36.5 C) (Oral)   Resp 18   Wt 68 kg   SpO2 100%   BMI 19.79 kg/m    Physical Exam Vitals signs and nursing note reviewed.  Constitutional:      Appearance: Normal appearance.  HENT:     Head: Normocephalic.     Right Ear: External ear normal.     Left Ear: External ear normal.     Nose: Nose normal.     Mouth/Throat:     Pharynx: Oropharynx is clear.     Comments: Almost completely edentulous Eyes:     Conjunctiva/sclera: Conjunctivae normal.  Neck:     Musculoskeletal: Normal range of motion and neck supple.  Cardiovascular:     Rate and Rhythm: Regular rhythm. Tachycardia present.     Pulses: Normal pulses.     Heart sounds: Normal heart sounds.  Pulmonary:     Effort: Pulmonary effort is normal.     Breath sounds: Wheezing and rales present.     Comments: Rales most notably on the right both anterior and posteriorly.  He has inspiratory and expiratory wheezing in the right anterior chest.  Breath sounds are diminished on the right anterior chest. Musculoskeletal: Normal range of motion.  Skin:    General: Skin is warm and dry.  Neurological:     General: No focal deficit present.     Mental Status: He is alert and oriented to person, place, and time.  Psychiatric:        Mood and Affect: Mood normal.      UC Treatments / Results  Labs (all labs ordered are listed, but only abnormal results are displayed) Labs Reviewed - No data to display  EKG None  Radiology No results found.  Procedures Procedures (including critical care time)  Medications Ordered in UC Medications  azithromycin (ZITHROMAX) tablet 1,000 mg  (has no administration in time range)  dexamethasone (DECADRON) 1 MG/ML solution 10 mg (has no administration in time range)    Initial Impression / Assessment and Plan / UC Course  I have reviewed the triage vital signs and  the nursing notes.  Pertinent labs & imaging results that were available during my care of the patient were reviewed by me and considered in my medical decision making (see chart for details).    Final Clinical Impressions(s) / UC Diagnoses   Final diagnoses:  COPD exacerbation ([place])  Continuous dependence on cigarette smoking  Poor dental hygiene  Peripheral neuropathy due to chemotherapy [facility])     Discharge Instructions     Try taking Vitamin B6 (pyridoxine) 50 mg three times a day for a month to help with the numbness.  I called in Chantix to help you quit smoking  Call your doctor if symptoms persist.    ED Prescriptions    Medication Sig Dispense Auth. Provider   varenicline (CHANTIX) 0.5 MG tablet Take 1 tablet (0.5 mg total) by mouth 2 (two) times daily. 60 tablet Robyn Haber, MD     Controlled Substance Prescriptions [place] Controlled Substance Registry consulted? Not Applicable   Robyn Haber, MD 08/20/18 [phone]

## 2018-08-20 NOTE — ED Triage Notes (Signed)
Travel none fever none Pt cc coughing x 4 days. Pt states she uses Simbcort.

## 2018-08-20 NOTE — ED Notes (Signed)
Patient verbalizes understanding of discharge instructions. Opportunity for questioning and answers were provided. Patient discharged from [facility] by RN.

## 2018-08-20 NOTE — Discharge Instructions (Addendum)
Try taking Vitamin B6 (pyridoxine) 50 mg three times a day for a month to help with the numbness.  I called in Chantix to help you quit smoking  Call your doctor if symptoms persist.

## 2018-08-23 ENCOUNTER — Other Ambulatory Visit: Payer: Self-pay

## 2018-08-23 ENCOUNTER — Other Ambulatory Visit: Payer: Medicare HMO

## 2018-08-23 ENCOUNTER — Ambulatory Visit (HOSPITAL_COMMUNITY)
Admission: RE | Admit: 2018-08-23 | Discharge: 2018-08-23 | Disposition: A | Discharge status: Home / Self Care | Payer: Medicare HMO | Source: Ambulatory Visit | Attending: Internal Medicine | Admitting: Internal Medicine

## 2018-08-23 ENCOUNTER — Inpatient Hospital Stay: Payer: Medicare HMO | Attending: Internal Medicine

## 2018-08-23 DIAGNOSIS — J90 Pleural effusion, not elsewhere classified: Secondary | ICD-10-CM | POA: Insufficient documentation

## 2018-08-23 DIAGNOSIS — C3490 Malignant neoplasm of unspecified part of unspecified bronchus or lung: Secondary | ICD-10-CM | POA: Diagnosis not present

## 2018-08-23 DIAGNOSIS — Z85118 Personal history of other malignant neoplasm of bronchus and lung: Secondary | ICD-10-CM | POA: Insufficient documentation

## 2018-08-23 LAB — CMP (CANCER CENTER ONLY)
ALT: 46 U/L — ABNORMAL HIGH (ref 0–44)
AST: 34 U/L (ref 15–41)
Albumin: 3.2 g/dL — ABNORMAL LOW (ref 3.5–5.0)
Alkaline Phosphatase: 95 U/L (ref 38–126)
Anion gap: 11 (ref 5–15)
BUN: 19 mg/dL (ref 8–23)
CO2: 27 mmol/L (ref 22–32)
Calcium: 9.2 mg/dL (ref 8.9–10.3)
Chloride: 107 mmol/L (ref 98–111)
Creatinine: 0.82 mg/dL (ref 0.61–1.24)
GFR, Est AFR Am: >60 mL/min (ref >60)
GFR, Estimated: >60 mL/min (ref >60)
Glucose, Bld: 94 mg/dL (ref 70–99)
Potassium: 4.2 mmol/L (ref 3.5–5.1)
Sodium: 145 mmol/L (ref 135–145)
Total Bilirubin: 0.4 mg/dL (ref 0.3–1.2)
Total Protein: 7 g/dL (ref 6.5–8.1)

## 2018-08-23 LAB — CBC WITH DIFFERENTIAL (CANCER CENTER ONLY)
Abs Immature Granulocytes: 0.03 10*3/uL (ref 0.00–0.07)
Basophils Absolute: 0.1 10*3/uL (ref 0.0–0.1)
Basophils Relative: 1 %
Eosinophils Absolute: 0.1 10*3/uL (ref 0.0–0.5)
Eosinophils Relative: 1 %
HCT: 42.7 % (ref 39.0–52.0)
Hemoglobin: 13.6 g/dL (ref 13.0–17.0)
Immature Granulocytes: 0 %
Lymphocytes Relative: 21 %
Lymphs Abs: 1.6 10*3/uL (ref 0.7–4.0)
MCH: 29 pg (ref 26.0–34.0)
MCHC: 31.9 g/dL (ref 30.0–36.0)
MCV: 91 fL (ref 80.0–100.0)
Monocytes Absolute: 0.5 10*3/uL (ref 0.1–1.0)
Monocytes Relative: 6 %
Neutro Abs: 5.3 10*3/uL (ref 1.7–7.7)
Neutrophils Relative %: 71 %
Platelet Count: 415 10*3/uL — ABNORMAL HIGH (ref 150–400)
RBC: 4.69 MIL/uL (ref 4.22–5.81)
RDW: 16.3 % — ABNORMAL HIGH (ref 11.5–15.5)
WBC Count: 7.5 10*3/uL (ref 4.0–10.5)
nRBC: 0 % (ref 0.0–0.2)

## 2018-08-23 MED ORDER — SODIUM CHLORIDE (PF) 0.9 % IJ SOLN
INTRAMUSCULAR | Status: AC
  Filled 2018-08-23: qty 50

## 2018-08-23 MED ORDER — IOHEXOL 300 MG/ML  SOLN
75.0000 mL | Freq: Once | INTRAMUSCULAR | Status: AC | PRN
  Administered 2018-08-23: 75 mL via INTRAVENOUS

## 2018-08-26 ENCOUNTER — Telehealth: Payer: Self-pay | Admitting: Medical Oncology

## 2018-08-26 ENCOUNTER — Inpatient Hospital Stay (HOSPITAL_BASED_OUTPATIENT_CLINIC_OR_DEPARTMENT_OTHER): Payer: Medicare HMO | Admitting: Internal Medicine

## 2018-08-26 DIAGNOSIS — C3490 Malignant neoplasm of unspecified part of unspecified bronchus or lung: Secondary | ICD-10-CM

## 2018-08-26 DIAGNOSIS — C3491 Malignant neoplasm of unspecified part of right bronchus or lung: Secondary | ICD-10-CM | POA: Insufficient documentation

## 2018-08-26 MED ORDER — DOXYCYCLINE HYCLATE 100 MG PO TABS: 100.0000 mg | ORAL_TABLET | Freq: Two times a day (BID) | ORAL | 0 refills | Status: DC

## 2018-08-26 NOTE — Telephone Encounter (Signed)
Apt changed to virtual visit

## 2018-08-26 NOTE — Progress Notes (Signed)
Virtual Visit via Telephone Note  I connected with Jared Stevens on 08/26/18 at  1:45 PM EDT by telephone and verified that I am speaking with the correct person using two identifiers.   I discussed the limitations, risks, security and privacy concerns of performing an evaluation and management service by telephone and the availability of in person appointments. I also discussed with the patient that there may be a patient responsible charge related to this service. The patient expressed understanding and agreed to proceed.  DIAGNOSIS: Stage IIIA non-small cell lung cancer, adenocarcinoma   PRIOR THERAPY: status post a course of concurrent chemoradiation with weekly carboplatin and paclitaxel completed in early 2018.  The patient has been in observation since that time.  CURRENT THERAPY: Observation.  History of Present Illness: Jared Stevens 68 y.o. male was evaluated by telephone virtual visit today for his routine follow-up visit.  The patient is feeling fine today with no concerning complaints except for shortness of breath increased with exertion.  He was seen recently at the emergency department and treated with a course of antibiotics and steroids.  He still have shortness of breath with cough productive of whitish sputum.  He denied having any fever or chills.  He has no nausea, vomiting, diarrhea or constipation.  He has no headache or visual changes.  He denied having any recent weight loss or night sweats.   MEDICAL HISTORY: Past Medical History:  Diagnosis Date  . COPD (chronic obstructive pulmonary disease) ([place])   . Hypercholesteremia   . Lung cancer ([place]) 03/2016    ALLERGIES:  has No Known Allergies.  MEDICATIONS:  Current Outpatient Medications  Medication Sig Dispense Refill  . atorvastatin (LIPITOR) 20 MG tablet     . diclofenac sodium (VOLTAREN) 1 % GEL APP 4 GRAMS TOPICALLY AA QID FOR 30 DAYS  1  . doxycycline (VIBRAMYCIN) 100 MG capsule Take 1 capsule (100 mg  total) by mouth 2 (two) times daily. 20 capsule 0  . HYDROcodone-acetaminophen (NORCO/VICODIN) 5-325 MG tablet Take 1 tablet by mouth every 4 (four) hours as needed. (Patient not taking: Reported on 05/06/2018) 8 tablet 0  . ibuprofen (ADVIL,MOTRIN) 600 MG tablet Take 1 tablet (600 mg total) by mouth every 6 (six) hours as needed. (Patient not taking: Reported on 05/06/2018) 30 tablet 0  . latanoprost (XALATAN) 0.005 % ophthalmic solution INT 1 GTT AEY BID  11  . naproxen (NAPROSYN) 500 MG tablet TK 1 T PO IN THE MORNING WF AND 1 T IN THE EVE WF  1  . predniSONE (DELTASONE) 10 MG tablet Take 2 tablets (20 mg total) by mouth daily. 15 tablet 0  . SPIRIVA HANDIHALER 18 MCG inhalation capsule SPC  3  . varenicline (CHANTIX) 0.5 MG tablet Take 1 tablet (0.5 mg total) by mouth 2 (two) times daily. 60 tablet 3  . VENTOLIN HFA 108 (90 Base) MCG/ACT inhaler INHALE 1 PUFF PO Q 6 H PRN. FOR 30 DAYS.  3  . WIXELA INHUB 250-50 MCG/DOSE AEPB      No current facility-administered medications for this visit.     SURGICAL HISTORY:  Past Surgical History:  Procedure Laterality Date  . Arm Surgery    . HERNIA REPAIR    . REPLANTATION THUMB      REVIEW OF SYSTEMS:  A comprehensive review of systems was negative except for: Constitutional: positive for fatigue Respiratory: positive for cough, dyspnea on exertion and sputum   LABORATORY DATA: Lab Results  Component Value Date  WBC 7.5 08/23/2018   HGB 13.6 08/23/2018   HCT 42.7 08/23/2018   MCV 91.0 08/23/2018   PLT 415 (H) 08/23/2018      Chemistry      Component Value Date/Time   NA 145 08/23/2018 1205   K 4.2 08/23/2018 1205   CL 107 08/23/2018 1205   CO2 27 08/23/2018 1205   BUN 19 08/23/2018 1205   CREATININE 0.82 08/23/2018 1205      Component Value Date/Time   CALCIUM 9.2 08/23/2018 1205   ALKPHOS 95 08/23/2018 1205   AST 34 08/23/2018 1205   ALT 46 (H) 08/23/2018 1205   BILITOT 0.4 08/23/2018 1205       RADIOGRAPHIC  STUDIES: Ct Chest W Contrast  Result Date: 08/23/2018 CLINICAL DATA:  Stage IIIA right lung adenocarcinoma status post chemotherapy and radiation therapy completed early 2018. Interval observation. Restaging. EXAM: CT CHEST WITH CONTRAST TECHNIQUE: Multidetector CT imaging of the chest was performed during intravenous contrast administration. CONTRAST:  4mL OMNIPAQUE IOHEXOL 300 MG/ML  SOLN COMPARISON:  05/23/2018 PET-CT.  04/05/2018 chest CT. FINDINGS: Cardiovascular: Top-normal heart size. No significant pericardial effusion/thickening. Atherosclerotic nonaneurysmal thoracic aorta. Normal caliber pulmonary arteries. No central pulmonary emboli. Mediastinum/Nodes: No discrete thyroid nodules. Unremarkable esophagus. No pathologically enlarged axillary, mediastinal or hilar lymph nodes. Lungs/Pleura: No pneumothorax. Large dependent right pleural effusion increased from 05/23/2018 PET-CT. No left pleural effusion. Moderate centrilobular emphysema. Nodular subpleural medial right upper lobe 1.1 cm focus of consolidation (series 5/image 95), stable since 04/05/2018 chest CT. Increased compressive atelectasis throughout the dependent right lung. Masslike sharply marginated right perihilar consolidation with associated volume loss and distortion, similar. The previously described lower-attenuation 4.4 x 3.5 cm right middle lobe masslike focus of consolidation measures 4.4 x 3.3 cm (series 2/image 109), not appreciably changed. New 3.3 x 2.1 cm centrally cavitary masslike focus of consolidation in the anterior left lower lobe (series 5/image 130). No additional significant pulmonary nodules. Upper abdomen: Scattered subcentimeter hypodense liver lesions, too small to characterize, not appreciably changed. Subcentimeter hypodense posterior upper left renal cortical lesion, too small to characterize, unchanged. Musculoskeletal: No aggressive appearing focal osseous lesions. Mild thoracic spondylosis. IMPRESSION: 1. New  centrally cavitary 3.3 cm masslike focus of consolidation in the anterior left lower lung lobe. Given the rapid appearance since 05/23/2018 PET-CT, cavitary infection is favored, although neoplasm is not excluded. Recommend attention on short-term follow-up chest CT in 1-2 months. 2. Large right pleural effusion, increased. 3. Masslike post treatment changes in the perihilar right lung are not appreciably changed. 4. No discrete thoracic adenopathy. Aortic Atherosclerosis (ICD10-I70.0) and Emphysema (ICD10-J43.9). Electronically Signed   By: Ilona Sorrel M.D.   On: 08/23/2018 16:10   Assessment and Plan: This is a very pleasant 68 years old African-American male with history of a stage IIIa non-small cell lung cancer, adenocarcinoma status post a course of concurrent chemoradiation in [place] in 2017. The patient is currently on observation and he is feeling fine except for the shortness of breath that started a week ago. Repeat CT scan of the chest performed recently showed new centrally cavitary masslike consolidation in the anterior left lower lobe suspicious for inflammatory process.  The patient also has increase in the size of the large right pleural effusion and no change in the perihilar right lung consolidation after his radiotherapy. I personally and independently reviewed the scan images and discussed the results with the patient today. I recommended for him to continue on observation with repeat CT scan of the  chest in 3 months for restaging of his disease. For the recent inflammatory process in the left lung, I gave the patient prescription for doxycycline 100 mg p.o. twice daily for 10 days. The patient was advised to call immediately if he has any concerning symptoms or worsening of his dyspnea.  Follow Up Instructions: Follow-up visit in 3 months with repeat lab and scan.   I discussed the assessment and treatment plan with the patient. The patient was provided an opportunity to ask  questions and all were answered. The patient agreed with the plan and demonstrated an understanding of the instructions.   The patient was advised to call back or seek an in-person evaluation if the symptoms worsen or if the condition fails to improve as anticipated.  I provided 15 minutes of non-face-to-face time during this encounter.   Eilleen Kempf, MD

## 2018-08-27 ENCOUNTER — Telehealth: Payer: Self-pay | Admitting: Internal Medicine

## 2018-08-27 NOTE — Telephone Encounter (Signed)
Scheduled appt per 3/30 sch message - sent reminder letter in the mail - central radiology to contact patient with ct appt.

## 2018-09-05 ENCOUNTER — Emergency Department (HOSPITAL_COMMUNITY): Payer: Medicare HMO

## 2018-09-05 ENCOUNTER — Emergency Department (HOSPITAL_COMMUNITY)
Admission: EM | Admit: 2018-09-05 | Discharge: 2018-09-05 | Disposition: A | Discharge status: Home / Self Care | Payer: Medicare HMO | Attending: Emergency Medicine | Admitting: Emergency Medicine

## 2018-09-05 ENCOUNTER — Other Ambulatory Visit: Payer: Self-pay

## 2018-09-05 DIAGNOSIS — J449 Chronic obstructive pulmonary disease, unspecified: Secondary | ICD-10-CM | POA: Diagnosis not present

## 2018-09-05 DIAGNOSIS — C3480 Malignant neoplasm of overlapping sites of unspecified bronchus and lung: Secondary | ICD-10-CM | POA: Diagnosis not present

## 2018-09-05 DIAGNOSIS — F17210 Nicotine dependence, cigarettes, uncomplicated: Secondary | ICD-10-CM | POA: Insufficient documentation

## 2018-09-05 DIAGNOSIS — Z79899 Other long term (current) drug therapy: Secondary | ICD-10-CM | POA: Insufficient documentation

## 2018-09-05 DIAGNOSIS — J90 Pleural effusion, not elsewhere classified: Secondary | ICD-10-CM | POA: Insufficient documentation

## 2018-09-05 DIAGNOSIS — R0602 Shortness of breath: Secondary | ICD-10-CM | POA: Diagnosis present

## 2018-09-05 LAB — CBC WITH DIFFERENTIAL/PLATELET
Abs Immature Granulocytes: 0.03 10*3/uL (ref 0.00–0.07)
Basophils Absolute: 0.1 10*3/uL (ref 0.0–0.1)
Basophils Relative: 1 %
Eosinophils Absolute: 0.1 10*3/uL (ref 0.0–0.5)
Eosinophils Relative: 1 %
HCT: 45.5 % (ref 39.0–52.0)
Hemoglobin: 14.7 g/dL (ref 13.0–17.0)
Immature Granulocytes: 0 %
Lymphocytes Relative: 17 %
Lymphs Abs: 1.5 10*3/uL (ref 0.7–4.0)
MCH: 29.1 pg (ref 26.0–34.0)
MCHC: 32.3 g/dL (ref 30.0–36.0)
MCV: 90.1 fL (ref 80.0–100.0)
Monocytes Absolute: 0.6 10*3/uL (ref 0.1–1.0)
Monocytes Relative: 6 %
Neutro Abs: 6.5 10*3/uL (ref 1.7–7.7)
Neutrophils Relative %: 75 %
Platelets: 372 10*3/uL (ref 150–400)
RBC: 5.05 MIL/uL (ref 4.22–5.81)
RDW: 17.1 % — ABNORMAL HIGH (ref 11.5–15.5)
WBC: 8.7 10*3/uL (ref 4.0–10.5)
nRBC: 0 % (ref 0.0–0.2)

## 2018-09-05 LAB — COMPREHENSIVE METABOLIC PANEL
ALT: 41 U/L (ref 0–44)
AST: 45 U/L — ABNORMAL HIGH (ref 15–41)
Albumin: 3.2 g/dL — ABNORMAL LOW (ref 3.5–5.0)
Alkaline Phosphatase: 83 U/L (ref 38–126)
Anion gap: 11 (ref 5–15)
BUN: 13 mg/dL (ref 8–23)
CO2: 25 mmol/L (ref 22–32)
Calcium: 9 mg/dL (ref 8.9–10.3)
Chloride: 105 mmol/L (ref 98–111)
Creatinine, Ser: 0.83 mg/dL (ref 0.61–1.24)
GFR calc Af Amer: >60 mL/min (ref >60)
GFR calc non Af Amer: >60 mL/min (ref >60)
Glucose, Bld: 93 mg/dL (ref 70–99)
Potassium: 4.2 mmol/L (ref 3.5–5.1)
Sodium: 141 mmol/L (ref 135–145)
Total Bilirubin: 0.6 mg/dL (ref 0.3–1.2)
Total Protein: 6.2 g/dL — ABNORMAL LOW (ref 6.5–8.1)

## 2018-09-05 LAB — TROPONIN I: Troponin I: <0.03 ng/mL (ref <0.03)

## 2018-09-05 LAB — URINALYSIS, ROUTINE W REFLEX MICROSCOPIC
Bilirubin Urine: NEGATIVE
Glucose, UA: NEGATIVE mg/dL
Hgb urine dipstick: NEGATIVE
Ketones, ur: NEGATIVE mg/dL
Leukocytes,Ua: NEGATIVE
Nitrite: NEGATIVE
Protein, ur: 30 mg/dL — AB
Specific Gravity, Urine: 1.023 (ref 1.005–1.030)
pH: 5 (ref 5.0–8.0)

## 2018-09-05 LAB — BODY FLUID CELL COUNT WITH DIFFERENTIAL
Eos, Fluid: 0 %
Lymphs, Fluid: 56 %
Monocyte-Macrophage-Serous Fluid: 8 % — ABNORMAL LOW (ref 50–90)
Neutrophil Count, Fluid: 36 % — ABNORMAL HIGH (ref 0–25)
Total Nucleated Cell Count, Fluid: 520 cu mm (ref 0–1000)

## 2018-09-05 LAB — LACTATE DEHYDROGENASE, PLEURAL OR PERITONEAL FLUID: LD, Fluid: 73 U/L — ABNORMAL HIGH (ref 3–23)

## 2018-09-05 LAB — BRAIN NATRIURETIC PEPTIDE: B Natriuretic Peptide: 1010.6 pg/mL — ABNORMAL HIGH (ref 0.0–100.0)

## 2018-09-05 LAB — GLUCOSE, PLEURAL OR PERITONEAL FLUID: Glucose, Fluid: 96 mg/dL

## 2018-09-05 LAB — LACTIC ACID, PLASMA: Lactic Acid, Venous: 1.4 mmol/L (ref 0.5–1.9)

## 2018-09-05 LAB — PROTEIN, PLEURAL OR PERITONEAL FLUID: Total protein, fluid: <3 g/dL

## 2018-09-05 LAB — LACTATE DEHYDROGENASE: LDH: 227 U/L — ABNORMAL HIGH (ref 98–192)

## 2018-09-05 MED ORDER — LIDOCAINE HCL (PF) 1 % IJ SOLN
5.0000 mL | Freq: Once | INTRAMUSCULAR | Status: AC
  Administered 2018-09-05: 17:00:00 5 mL
  Filled 2018-09-05: qty 5

## 2018-09-05 MED ORDER — IOPAMIDOL (ISOVUE-300) INJECTION 61%
75.0000 mL | Freq: Once | INTRAVENOUS | Status: AC | PRN
  Administered 2018-09-05: 75 mL via INTRAVENOUS

## 2018-09-05 NOTE — ED Notes (Signed)
Culture sent with UA 

## 2018-09-05 NOTE — ED Notes (Signed)
Patient verbalizes understanding of discharge instructions. Opportunity for questioning and answers were provided. Armband removed by staff, pt discharged from ED.  

## 2018-09-05 NOTE — ED Notes (Signed)
Pt tolerated procedure well. He is eating a snack.

## 2018-09-05 NOTE — ED Triage Notes (Signed)
Pt reports shortness of breath that started around 0100 this morning. Pt reports that his inhaler mediation was recently switched and he has been having trouble with his COPD since then. Per EMS pt was 95% on room air. Pt was assisted with 8 puffs of his MDI by EMS. Pt initially reports some chest tightness but that was relieved following the albuterol. Pt also reports increased leg swelling. Pt denying any pain at present time. Pt is 98% on room air. Pt denying contact with anyone who was sick or any fevers at home. Pt does report a productive cough.

## 2018-09-05 NOTE — Discharge Instructions (Addendum)
1.  Call your oncologist and your doctor to schedule a recheck as soon as possible. 2.  Return to the emergency department if you have worsening shortness of breath, fever, cough, sudden chest pain or other concerning symptoms.

## 2018-09-05 NOTE — ED Notes (Signed)
Items requested have been placed at bedside. Dr Johnney Killian at bedside.

## 2018-09-05 NOTE — ED Provider Notes (Signed)
[facility] EMERGENCY DEPARTMENT Provider Note   CSN: [id number] Arrival date & time: 09/05/18  1601    History   Chief Complaint Chief Complaint  Patient presents with   Shortness of Breath    HPI Jared Stevens is a 68 y.o. male.     HPI Reports no significant change since being treated with a azithromycin and prednisone at the end of March through urgent care.  Followed up with oncology Dr. Julien Nordmann 3\30 for a follow-up.  Patient's antibiotics extended by 10 days of doxycycline.  Patient reports his main concern is that he had a lesion in the left long on the CT scan and he does not feel comfortable waiting for the 3 months for repeat scanning that was planned on an outpatient basis.  He reports he still has the same cough although he does not feel particularly bad.  He reports he gets really short of breath pretty easily.  He reports at night sometimes he is huffing and puffing until he can catch his breath.  He however denies that it significantly worse by lying flat.  Reports he is also developed some swelling in his lower legs and feet over the past several days.  He has not had any fever at any point with this illness.  Denies myalgias.  He denies that he generally feels poorly.  He does however express significant concern about taking 10 days of antibiotics and then waiting 3 months to do further evaluation.  Patient is trying to quit smoking but does continue to smoke a small amount. Past Medical History:  Diagnosis Date   COPD (chronic obstructive pulmonary disease) ([place])    Hypercholesteremia    Lung cancer ([place]) 03/2016    Patient Active Problem List   Diagnosis Date Noted   Non-small cell lung cancer, right ([place]) 08/26/2018    Past Surgical History:  Procedure Laterality Date   Arm Surgery     HERNIA REPAIR     REPLANTATION THUMB          Home Medications    Prior to Admission medications   Medication Sig Start Date End Date Taking?  Authorizing Provider  atorvastatin (LIPITOR) 20 MG tablet  04/22/18  Yes [provider]  doxycycline (VIBRA-TABS) 100 MG tablet Take 1 tablet (100 mg total) by mouth 2 (two) times daily. 08/26/18  Yes Curt Bears, MD  ibuprofen (ADVIL,MOTRIN) 800 MG tablet Take 800 mg by mouth 3 (three) times daily as needed for pain. 07/19/18  Yes [provider]  latanoprost (XALATAN) 0.005 % ophthalmic solution Place 1 drop into both eyes 2 (two) times daily.  03/12/18  Yes [provider]  SYMBICORT 160-4.5 MCG/ACT inhaler Inhale 2 puffs into the lungs 2 (two) times daily. Morning & evening 08/21/18  Yes [provider]  VENTOLIN HFA 108 (90 Base) MCG/ACT inhaler Inhale 1 puff into the lungs every 6 (six) hours as needed for wheezing or shortness of breath.  04/11/18  Yes [provider]  HYDROcodone-acetaminophen (NORCO/VICODIN) 5-325 MG tablet Take 1 tablet by mouth every 4 (four) hours as needed. Patient not taking: Reported on 05/06/2018 11/13/17   Charlann Lange, PA-C  ibuprofen (ADVIL,MOTRIN) 600 MG tablet Take 1 tablet (600 mg total) by mouth every 6 (six) hours as needed. Patient not taking: Reported on 05/06/2018 11/13/17   Charlann Lange, PA-C  predniSONE (DELTASONE) 10 MG tablet Take 2 tablets (20 mg total) by mouth daily. Patient not taking: Reported on 09/05/2018 06/23/18  Wardell Honour, MD  varenicline (CHANTIX) 0.5 MG tablet Take 1 tablet (0.5 mg total) by mouth 2 (two) times daily. Patient not taking: Reported on 09/05/2018 08/20/18   Robyn Haber, MD    Family History No family history on file.  Social History Social History   Tobacco Use   Smoking status: Current Some Day Smoker   Smokeless tobacco: Never Used  Substance Use Topics   Alcohol use: Not Currently   Drug use: Not Currently     Allergies   Patient has no known allergies.   Review of Systems Review of Systems 10 Systems reviewed and are negative for acute change  except as noted in the HPI.   Physical Exam Updated Vital Signs BP (!) 115/94    Pulse (!) 102    Temp (!) 96.9 F (36.1 C) (Axillary)    Resp (!) 23    Ht 6' 1"  (1.854 m)    Wt 74.8 kg    SpO2 100%    BMI 21.77 kg/m   Physical Exam Constitutional:      Comments: Alert and nontoxic.  Mild tachypnea but no significant respiratory distress at rest.  Patient is thin.  HENT:     Head: Normocephalic and atraumatic.     Comments: Moderately poor dentition but no active facial swelling or dental pain.  Airway widely patent.    Nose: Nose normal.  Eyes:     Extraocular Movements: Extraocular movements intact.  Neck:     Musculoskeletal: Neck supple.  Cardiovascular:     Rate and Rhythm: Normal rate and regular rhythm.  Pulmonary:     Comments: Tachypnea.  Patient had cough paroxysmal to clear lungs when starting auscultation.  Once coughing abated, breath sounds on the right are very soft to the upper lung fields.  Left has a rub coarse expiratory wheeze in the mid and lower lung fields. Abdominal:     General: There is no distension.     Palpations: Abdomen is soft.     Tenderness: There is no abdominal tenderness. There is no guarding.  Musculoskeletal:     Comments: 2+ pitting edema bilateral feet and ankles.  Calves are soft and nontender.  Skin:    General: Skin is warm and dry.  Neurological:     General: No focal deficit present.     Mental Status: He is oriented to person, place, and time.     Coordination: Coordination normal.  Psychiatric:        Mood and Affect: Mood normal.      ED Treatments / Results  Labs (all labs ordered are listed, but only abnormal results are displayed) Labs Reviewed  COMPREHENSIVE METABOLIC PANEL - Abnormal; Notable for the following components:      Result Value   Total Protein 6.2 (*)    Albumin 3.2 (*)    AST 45 (*)    All other components within normal limits  BRAIN NATRIURETIC PEPTIDE - Abnormal; Notable for the following  components:   B Natriuretic Peptide 1,010.6 (*)    All other components within normal limits  CBC WITH DIFFERENTIAL/PLATELET - Abnormal; Notable for the following components:   RDW 17.1 (*)    All other components within normal limits  URINALYSIS, ROUTINE W REFLEX MICROSCOPIC - Abnormal; Notable for the following components:   Protein, ur 30 (*)    Bacteria, UA RARE (*)    All other components within normal limits  LACTATE DEHYDROGENASE - Abnormal; Notable for the following  components:   LDH 227 (*)    All other components within normal limits  CULTURE, BLOOD (ROUTINE X 2)  CULTURE, BLOOD (ROUTINE X 2)  BODY FLUID CULTURE  TROPONIN I  LACTIC ACID, PLASMA  LACTIC ACID, PLASMA  BODY FLUID CELL COUNT WITH DIFFERENTIAL  GLUCOSE, PLEURAL OR PERITONEAL FLUID  LACTATE DEHYDROGENASE, PLEURAL OR PERITONEAL FLUID  PROTEIN, PLEURAL OR PERITONEAL FLUID  CYTOLOGY - NON PAP    EKG EKG Interpretation  Date/Time:  Thursday September 05 2018 07:15:50 EDT Ventricular Rate:  104 PR Interval:    QRS Duration: 102 QT Interval:  373 QTC Calculation: 491 R Axis:   79 Text Interpretation:  Sinus tachycardia Probable left atrial enlargement LVH with secondary repolarization abnormality Borderline prolonged QT interval agree. no old, no STEMI Confirmed by Charlesetta Shanks [phone]) on 09/05/2018 7:22:31 AM   Radiology Ct Chest W Contrast  Result Date: 09/05/2018 CLINICAL DATA:  Shortness of breath EXAM: CT CHEST WITH CONTRAST TECHNIQUE: Multidetector CT imaging of the chest was performed during intravenous contrast administration. CONTRAST:  66m ISOVUE-300 IOPAMIDOL (ISOVUE-300) INJECTION 61% COMPARISON:  08/23/2018, PET-CT, 05/23/2018 FINDINGS: Cardiovascular: No significant vascular findings. Normal heart size. No pericardial effusion. Mediastinum/Nodes: No enlarged mediastinal, hilar, or axillary lymph nodes. Thyroid gland, trachea, and esophagus demonstrate no significant findings. Lungs/Pleura:  Redemonstrated moderate to large right pleural effusion, not significantly changed compared to prior examination. There is resolved cavitation and decreased size of a spiculated, masslike nodule of the anterior left lower lobe (series 3, image 127). No change in other masslike areas of consolidation, most conspicuous about the right hilum and in the anterior right lower lobe. Upper Abdomen: No acute abnormality. Musculoskeletal: No chest wall mass or suspicious bone lesions identified. IMPRESSION: Redemonstrated moderate to large right pleural effusion, not significantly changed compared to prior examination. There is resolved cavitation and decreased size of a spiculated, masslike nodule of the anterior left lower lobe (series 3, image 127). No change in other masslike areas of consolidation, most conspicuous about the right hilum and in the anterior right lower lobe. The appearance of these findings in general remains highly suspicious for malignancy although no PET avidity was appreciated on PET-CT dated 05/23/2018. No new airspace opacity or other findings to explain shortness of breath are noted on current examination. Electronically Signed   By: AEddie CandleM.D.   On: 09/05/2018 10:47   Dg Chest Port 1 View  Result Date: 09/05/2018 CLINICAL DATA:  Status post right thoracentesis. EXAM: PORTABLE CHEST 1 VIEW COMPARISON:  CT chest and single view of the chest earlier today. FINDINGS: Right pleural effusion is decreased after thoracentesis. No pneumothorax. Streaky atelectasis left lung base noted. Small cavitary lesion in the anterior left lower lobe is not visible on this study. Cardiac silhouette is partially obscured. No acute or focal bony abnormality. IMPRESSION: Decreased right effusion after thoracentesis. No new abnormality negative for pneumothorax. Electronically Signed   By: TInge RiseM.D.   On: 09/05/2018 15:47   Dg Chest Port 1 View  Result Date: 09/05/2018 CLINICAL DATA:   Cancer/pneumonia EXAM: PORTABLE CHEST 1 VIEW COMPARISON:  06/23/2018 radiograph.  Chest CT 08/23/2018 FINDINGS: Moderate to large right pleural effusion with opacified right base which is both collapsed/opacified-see prior CT. The left chest is radiographically clear. Mild cardiomegaly. IMPRESSION: Large right pleural effusion obscuring pulmonary collapse at the right base, see chest CT 08/23/2018 Electronically Signed   By: JMonte FantasiaM.D.   On: 09/05/2018 08:49    Procedures THORACENTESIS BEDSIDE  Date/Time: 09/05/2018 4:02 PM Performed by: Charlesetta Shanks, MD Authorized by: Charlesetta Shanks, MD   Consent:    Consent obtained:  Verbal and written   Consent given by:  Patient   Risks discussed:  Bleeding, infection, nerve damage, incomplete drainage, pneumothorax and pain   Alternatives discussed:  No treatment, delayed treatment, alternative treatment, observation and referral Anesthesia (see MAR for exact dosages):    Anesthesia method:  Local infiltration   Local anesthetic:  Lidocaine 1% w/o epi Procedure details:    Patient position:  Sitting   Location:  R midscapular line   Intercostal space:  6th   Puncture method:  Over-the-needle catheter   Ultrasound guidance: no     Indwelling catheter placed: no     Catheter size:  8 Fr   Number of attempts:  1   Drainage characteristics:  Clear Post-procedure details:    Chest x-ray performed: yes     Chest x-ray findings:  Pleural effusion improved   Patient tolerance of procedure:  Tolerated well, no immediate complications Comments:     Thoracentesis with thoracentesis kit.  Return of clear straw-colored fluid without difficulty.  Patient tolerated well.  Much improved post procedure.  750 cc volume returned.   (including critical care time)  Medications Ordered in ED Medications  lidocaine (PF) (XYLOCAINE) 1 % injection 5 mL (has no administration in time range)  iopamidol (ISOVUE-300) 61 % injection 75 mL (75 mLs  Intravenous Contrast Given 09/05/18 0945)     Initial Impression / Assessment and Plan / ED Course  I have reviewed the triage vital signs and the nursing notes.  Pertinent labs & imaging results that were available during my care of the patient were reviewed by me and considered in my medical decision making (see chart for details).  Clinical Course as of Sep 04 1600  Thu Sep 05, 2018  1601 Consult: Reviewed with Dr. Earlie Server.  Advises to proceed with thoracentesis and submit cytology specimen.   [MP]    Clinical Course User Index [MP] Charlesetta Shanks, MD      Presents with known non-small cell lung cancer.  Was recently treated with antibiotic course for a cavitary lesion.  That has improved per review of CT scan.  Patient however has a large right pleural effusion and reports he gets very short of breath with minimal activity and sometimes finds himself gasping at night.  He has not had any fevers he has not had any progressive worsening coughing or myalgia.  Reviewed with Dr. Julien Nordmann and proceeded with bedside thoracentesis.  Patient tolerated this very well and feels improved.  He has been alert and nontoxic throughout his stay in the emergency department.  Stable for discharge with follow-up with oncology and his family doctor.  Final Clinical Impressions(s) / ED Diagnoses   Final diagnoses:  Pleural effusion  Malignant neoplasm of overlapping sites of lung, unspecified laterality [facility])    ED Discharge Orders    None       Charlesetta Shanks, MD 09/05/18 1606

## 2018-09-05 NOTE — ED Notes (Signed)
Patient transported to CT 

## 2018-09-06 LAB — PATHOLOGIST SMEAR REVIEW

## 2018-09-09 ENCOUNTER — Telehealth: Payer: Self-pay | Admitting: Medical Oncology

## 2018-09-09 LAB — BODY FLUID CULTURE: Culture: NO GROWTH

## 2018-09-09 NOTE — Telephone Encounter (Signed)
Pt had thoracentesis over the weekend and is breathing better. He has 1 Doxycycline left.

## 2018-09-10 LAB — CULTURE, BLOOD (ROUTINE X 2)
Culture: NO GROWTH
Culture: NO GROWTH
Special Requests: ADEQUATE

## 2018-09-27 ENCOUNTER — Inpatient Hospital Stay (HOSPITAL_COMMUNITY): Payer: Medicare HMO

## 2018-09-27 ENCOUNTER — Emergency Department (HOSPITAL_COMMUNITY): Payer: Medicare HMO

## 2018-09-27 ENCOUNTER — Inpatient Hospital Stay (HOSPITAL_COMMUNITY)
Admission: EM | Admit: 2018-09-27 | Discharge: 2018-10-02 | DRG: 286 | Disposition: A | Discharge status: Home / Self Care | Payer: Medicare HMO | Attending: Internal Medicine | Admitting: Internal Medicine

## 2018-09-27 ENCOUNTER — Other Ambulatory Visit: Payer: Self-pay

## 2018-09-27 ENCOUNTER — Encounter (HOSPITAL_COMMUNITY): Payer: Self-pay | Admitting: *Deleted

## 2018-09-27 DIAGNOSIS — F1490 Cocaine use, unspecified, uncomplicated: Secondary | ICD-10-CM | POA: Diagnosis present

## 2018-09-27 DIAGNOSIS — F17210 Nicotine dependence, cigarettes, uncomplicated: Secondary | ICD-10-CM | POA: Diagnosis present

## 2018-09-27 DIAGNOSIS — Z923 Personal history of irradiation: Secondary | ICD-10-CM | POA: Diagnosis not present

## 2018-09-27 DIAGNOSIS — I361 Nonrheumatic tricuspid (valve) insufficiency: Secondary | ICD-10-CM

## 2018-09-27 DIAGNOSIS — I5023 Acute on chronic systolic (congestive) heart failure: Secondary | ICD-10-CM | POA: Diagnosis not present

## 2018-09-27 DIAGNOSIS — J438 Other emphysema: Secondary | ICD-10-CM | POA: Diagnosis not present

## 2018-09-27 DIAGNOSIS — I420 Dilated cardiomyopathy: Secondary | ICD-10-CM | POA: Diagnosis not present

## 2018-09-27 DIAGNOSIS — Z9221 Personal history of antineoplastic chemotherapy: Secondary | ICD-10-CM | POA: Diagnosis not present

## 2018-09-27 DIAGNOSIS — I639 Cerebral infarction, unspecified: Secondary | ICD-10-CM

## 2018-09-27 DIAGNOSIS — Z79890 Hormone replacement therapy (postmenopausal): Secondary | ICD-10-CM

## 2018-09-27 DIAGNOSIS — Z7952 Long term (current) use of systemic steroids: Secondary | ICD-10-CM | POA: Diagnosis not present

## 2018-09-27 DIAGNOSIS — I252 Old myocardial infarction: Secondary | ICD-10-CM | POA: Diagnosis not present

## 2018-09-27 DIAGNOSIS — K761 Chronic passive congestion of liver: Secondary | ICD-10-CM | POA: Diagnosis present

## 2018-09-27 DIAGNOSIS — J90 Pleural effusion, not elsewhere classified: Secondary | ICD-10-CM | POA: Diagnosis not present

## 2018-09-27 DIAGNOSIS — I5043 Acute on chronic combined systolic (congestive) and diastolic (congestive) heart failure: Secondary | ICD-10-CM | POA: Diagnosis not present

## 2018-09-27 DIAGNOSIS — E785 Hyperlipidemia, unspecified: Secondary | ICD-10-CM | POA: Diagnosis present

## 2018-09-27 DIAGNOSIS — I509 Heart failure, unspecified: Secondary | ICD-10-CM | POA: Diagnosis not present

## 2018-09-27 DIAGNOSIS — R0602 Shortness of breath: Secondary | ICD-10-CM

## 2018-09-27 DIAGNOSIS — C3490 Malignant neoplasm of unspecified part of unspecified bronchus or lung: Secondary | ICD-10-CM | POA: Diagnosis present

## 2018-09-27 DIAGNOSIS — R7989 Other specified abnormal findings of blood chemistry: Secondary | ICD-10-CM | POA: Diagnosis not present

## 2018-09-27 DIAGNOSIS — I340 Nonrheumatic mitral (valve) insufficiency: Secondary | ICD-10-CM

## 2018-09-27 DIAGNOSIS — J918 Pleural effusion in other conditions classified elsewhere: Secondary | ICD-10-CM | POA: Diagnosis present

## 2018-09-27 DIAGNOSIS — Z791 Long term (current) use of non-steroidal anti-inflammatories (NSAID): Secondary | ICD-10-CM | POA: Diagnosis not present

## 2018-09-27 DIAGNOSIS — F17209 Nicotine dependence, unspecified, with unspecified nicotine-induced disorders: Secondary | ICD-10-CM | POA: Diagnosis present

## 2018-09-27 DIAGNOSIS — E440 Moderate protein-calorie malnutrition: Secondary | ICD-10-CM | POA: Diagnosis present

## 2018-09-27 DIAGNOSIS — I4510 Unspecified right bundle-branch block: Secondary | ICD-10-CM | POA: Diagnosis present

## 2018-09-27 DIAGNOSIS — I2510 Atherosclerotic heart disease of native coronary artery without angina pectoris: Secondary | ICD-10-CM | POA: Diagnosis not present

## 2018-09-27 DIAGNOSIS — Z79899 Other long term (current) drug therapy: Secondary | ICD-10-CM | POA: Diagnosis not present

## 2018-09-27 DIAGNOSIS — J410 Simple chronic bronchitis: Secondary | ICD-10-CM | POA: Diagnosis not present

## 2018-09-27 DIAGNOSIS — J449 Chronic obstructive pulmonary disease, unspecified: Secondary | ICD-10-CM | POA: Diagnosis present

## 2018-09-27 DIAGNOSIS — Z720 Tobacco use: Secondary | ICD-10-CM | POA: Diagnosis present

## 2018-09-27 DIAGNOSIS — I5021 Acute systolic (congestive) heart failure: Secondary | ICD-10-CM | POA: Diagnosis present

## 2018-09-27 DIAGNOSIS — Z7951 Long term (current) use of inhaled steroids: Secondary | ICD-10-CM

## 2018-09-27 DIAGNOSIS — R931 Abnormal findings on diagnostic imaging of heart and coronary circulation: Secondary | ICD-10-CM | POA: Diagnosis not present

## 2018-09-27 DIAGNOSIS — Z9889 Other specified postprocedural states: Secondary | ICD-10-CM | POA: Diagnosis not present

## 2018-09-27 DIAGNOSIS — Z681 Body mass index (BMI) 19 or less, adult: Secondary | ICD-10-CM | POA: Diagnosis not present

## 2018-09-27 DIAGNOSIS — Z85118 Personal history of other malignant neoplasm of bronchus and lung: Secondary | ICD-10-CM

## 2018-09-27 DIAGNOSIS — J9621 Acute and chronic respiratory failure with hypoxia: Secondary | ICD-10-CM | POA: Diagnosis present

## 2018-09-27 DIAGNOSIS — J441 Chronic obstructive pulmonary disease with (acute) exacerbation: Secondary | ICD-10-CM | POA: Diagnosis present

## 2018-09-27 HISTORY — DX: Nicotine dependence, unspecified, with unspecified nicotine-induced disorders: F17.209

## 2018-09-27 HISTORY — PX: IR THORACENTESIS ASP PLEURAL SPACE W/IMG GUIDE: IMG5380

## 2018-09-27 HISTORY — DX: Heart failure, unspecified: I50.9

## 2018-09-27 HISTORY — DX: Cerebral infarction, unspecified: I63.9

## 2018-09-27 LAB — COMPREHENSIVE METABOLIC PANEL
ALT: 51 U/L — ABNORMAL HIGH (ref 0–44)
AST: 62 U/L — ABNORMAL HIGH (ref 15–41)
Albumin: 3.3 g/dL — ABNORMAL LOW (ref 3.5–5.0)
Alkaline Phosphatase: 79 U/L (ref 38–126)
Anion gap: 9 (ref 5–15)
BUN: 22 mg/dL (ref 8–23)
CO2: 23 mmol/L (ref 22–32)
Calcium: 8.9 mg/dL (ref 8.9–10.3)
Chloride: 110 mmol/L (ref 98–111)
Creatinine, Ser: 0.93 mg/dL (ref 0.61–1.24)
GFR calc Af Amer: >60 mL/min (ref >60)
GFR calc non Af Amer: >60 mL/min (ref >60)
Glucose, Bld: 93 mg/dL (ref 70–99)
Potassium: 4 mmol/L (ref 3.5–5.1)
Sodium: 142 mmol/L (ref 135–145)
Total Bilirubin: 0.6 mg/dL (ref 0.3–1.2)
Total Protein: 6.2 g/dL — ABNORMAL LOW (ref 6.5–8.1)

## 2018-09-27 LAB — CBC WITH DIFFERENTIAL/PLATELET
Abs Immature Granulocytes: 0.01 10*3/uL (ref 0.00–0.07)
Basophils Absolute: 0.1 10*3/uL (ref 0.0–0.1)
Basophils Relative: 1 %
Eosinophils Absolute: 0.1 10*3/uL (ref 0.0–0.5)
Eosinophils Relative: 1 %
HCT: 43 % (ref 39.0–52.0)
Hemoglobin: 14 g/dL (ref 13.0–17.0)
Immature Granulocytes: 0 %
Lymphocytes Relative: 23 %
Lymphs Abs: 1.7 10*3/uL (ref 0.7–4.0)
MCH: 29.1 pg (ref 26.0–34.0)
MCHC: 32.6 g/dL (ref 30.0–36.0)
MCV: 89.4 fL (ref 80.0–100.0)
Monocytes Absolute: 0.5 10*3/uL (ref 0.1–1.0)
Monocytes Relative: 6 %
Neutro Abs: 4.9 10*3/uL (ref 1.7–7.7)
Neutrophils Relative %: 69 %
Platelets: 320 10*3/uL (ref 150–400)
RBC: 4.81 MIL/uL (ref 4.22–5.81)
RDW: 16.5 % — ABNORMAL HIGH (ref 11.5–15.5)
WBC: 7.2 10*3/uL (ref 4.0–10.5)
nRBC: 0 % (ref 0.0–0.2)

## 2018-09-27 LAB — BODY FLUID CELL COUNT WITH DIFFERENTIAL
Lymphs, Fluid: 77 %
Monocyte-Macrophage-Serous Fluid: 15 % — ABNORMAL LOW (ref 50–90)
Neutrophil Count, Fluid: 8 % (ref 0–25)
Total Nucleated Cell Count, Fluid: 504 cu mm (ref 0–1000)

## 2018-09-27 LAB — GRAM STAIN

## 2018-09-27 LAB — ALBUMIN, PLEURAL OR PERITONEAL FLUID: Albumin, Fluid: 1.1 g/dL

## 2018-09-27 LAB — ECHOCARDIOGRAM LIMITED
Height: 73 in
Weight: 2372.8 oz

## 2018-09-27 LAB — LACTATE DEHYDROGENASE, PLEURAL OR PERITONEAL FLUID: LD, Fluid: 63 U/L — ABNORMAL HIGH (ref 3–23)

## 2018-09-27 LAB — GLUCOSE, PLEURAL OR PERITONEAL FLUID: Glucose, Fluid: 103 mg/dL

## 2018-09-27 LAB — BRAIN NATRIURETIC PEPTIDE: B Natriuretic Peptide: 1225.5 pg/mL — ABNORMAL HIGH (ref 0.0–100.0)

## 2018-09-27 LAB — PROTEIN, PLEURAL OR PERITONEAL FLUID: Total protein, fluid: <3 g/dL

## 2018-09-27 MED ORDER — LIDOCAINE HCL 1 % IJ SOLN
INTRAMUSCULAR | Status: DC | PRN
  Administered 2018-09-27: 10 mL

## 2018-09-27 MED ORDER — ONDANSETRON HCL 4 MG PO TABS: 4.0000 mg | ORAL_TABLET | Freq: Four times a day (QID) | ORAL | Status: DC | PRN

## 2018-09-27 MED ORDER — LIDOCAINE HCL 1 % IJ SOLN
INTRAMUSCULAR | Status: AC
  Filled 2018-09-27: qty 20

## 2018-09-27 MED ORDER — ALBUTEROL SULFATE (2.5 MG/3ML) 0.083% IN NEBU
2.5000 mg | INHALATION_SOLUTION | RESPIRATORY_TRACT | Status: DC | PRN
  Administered 2018-09-29: 2.5 mg via RESPIRATORY_TRACT
  Filled 2018-09-27: qty 3

## 2018-09-27 MED ORDER — FUROSEMIDE 10 MG/ML IJ SOLN
20.0000 mg | Freq: Two times a day (BID) | INTRAMUSCULAR | Status: DC
  Administered 2018-09-27 – 2018-10-01 (×8): 20 mg via INTRAVENOUS
  Filled 2018-09-27 (×10): qty 2

## 2018-09-27 MED ORDER — ACETAMINOPHEN 325 MG PO TABS: 650.0000 mg | ORAL_TABLET | Freq: Four times a day (QID) | ORAL | Status: DC | PRN

## 2018-09-27 MED ORDER — ONDANSETRON HCL 4 MG/2ML IJ SOLN: 4.0000 mg | Freq: Four times a day (QID) | INTRAMUSCULAR | Status: DC | PRN

## 2018-09-27 MED ORDER — IPRATROPIUM BROMIDE HFA 17 MCG/ACT IN AERS
2.0000 | INHALATION_SPRAY | Freq: Once | RESPIRATORY_TRACT | Status: AC
  Administered 2018-09-27: 2 via RESPIRATORY_TRACT
  Filled 2018-09-27 (×2): qty 12.9

## 2018-09-27 MED ORDER — LATANOPROST 0.005 % OP SOLN
1.0000 [drp] | Freq: Two times a day (BID) | OPHTHALMIC | Status: DC
  Administered 2018-09-27 – 2018-10-02 (×11): 1 [drp] via OPHTHALMIC
  Filled 2018-09-27: qty 2.5

## 2018-09-27 MED ORDER — HYDROCODONE-ACETAMINOPHEN 5-325 MG PO TABS: 1.0000 | ORAL_TABLET | ORAL | Status: DC | PRN

## 2018-09-27 MED ORDER — ACETAMINOPHEN 650 MG RE SUPP: 650.0000 mg | Freq: Four times a day (QID) | RECTAL | Status: DC | PRN

## 2018-09-27 MED ORDER — ALBUTEROL SULFATE HFA 108 (90 BASE) MCG/ACT IN AERS
6.0000 | INHALATION_SPRAY | Freq: Once | RESPIRATORY_TRACT | Status: AC
  Administered 2018-09-27: 6 via RESPIRATORY_TRACT
  Filled 2018-09-27: qty 6.7

## 2018-09-27 MED ORDER — FUROSEMIDE 10 MG/ML IJ SOLN
20.0000 mg | Freq: Once | INTRAMUSCULAR | Status: AC
  Administered 2018-09-27: 20 mg via INTRAVENOUS
  Filled 2018-09-27: qty 2

## 2018-09-27 MED ORDER — SODIUM CHLORIDE 0.9% FLUSH
3.0000 mL | Freq: Two times a day (BID) | INTRAVENOUS | Status: DC
  Administered 2018-09-27 – 2018-10-02 (×10): 3 mL via INTRAVENOUS

## 2018-09-27 MED ORDER — AMITRIPTYLINE HCL 50 MG PO TABS
50.0000 mg | ORAL_TABLET | Freq: Every day | ORAL | Status: DC
  Administered 2018-09-27 – 2018-10-01 (×5): 50 mg via ORAL
  Filled 2018-09-27 (×6): qty 1

## 2018-09-27 MED ORDER — ENOXAPARIN SODIUM 40 MG/0.4ML ~~LOC~~ SOLN
40.0000 mg | SUBCUTANEOUS | Status: DC
  Administered 2018-09-27 – 2018-10-01 (×5): 40 mg via SUBCUTANEOUS
  Filled 2018-09-27 (×5): qty 0.4

## 2018-09-27 MED ORDER — NICOTINE 21 MG/24HR TD PT24
21.0000 mg | MEDICATED_PATCH | Freq: Every day | TRANSDERMAL | Status: DC
  Administered 2018-09-27 – 2018-10-02 (×5): 21 mg via TRANSDERMAL
  Filled 2018-09-27 (×6): qty 1

## 2018-09-27 MED ORDER — ALBUTEROL SULFATE 2.5 MG/0.5ML IN NEBU: 1.0000 | INHALATION_SOLUTION | Freq: Four times a day (QID) | RESPIRATORY_TRACT | Status: DC | PRN

## 2018-09-27 MED ORDER — FUROSEMIDE 10 MG/ML IJ SOLN: 20.0000 mg | Freq: Once | INTRAMUSCULAR | Status: DC

## 2018-09-27 MED ORDER — AEROCHAMBER PLUS FLO-VU LARGE MISC
Status: AC
  Administered 2018-09-27: 1
  Filled 2018-09-27: qty 1

## 2018-09-27 MED ORDER — ATORVASTATIN CALCIUM 10 MG PO TABS
20.0000 mg | ORAL_TABLET | Freq: Every day | ORAL | Status: DC
  Administered 2018-09-27 – 2018-10-01 (×5): 20 mg via ORAL
  Filled 2018-09-27 (×5): qty 2

## 2018-09-27 MED ORDER — MOMETASONE FURO-FORMOTEROL FUM 200-5 MCG/ACT IN AERO
2.0000 | INHALATION_SPRAY | Freq: Two times a day (BID) | RESPIRATORY_TRACT | Status: DC
  Administered 2018-09-28 – 2018-10-02 (×8): 2 via RESPIRATORY_TRACT
  Filled 2018-09-27 (×2): qty 8.8

## 2018-09-27 NOTE — ED Notes (Signed)
Pt ambulated around nurses station. Oxygen saturation dropped from 96% to 90% on RA while ambulating. Pt complains of SOB. Pt oxygen went back to 96% after rest.

## 2018-09-27 NOTE — Plan of Care (Signed)
  Problem: Education: Goal: Ability to verbalize understanding of medication therapies will improve Outcome: Progressing   Problem: Activity: Goal: Capacity to carry out activities will improve Outcome: Progressing   Problem: Cardiac: Goal: Ability to achieve and maintain adequate cardiopulmonary perfusion will improve Outcome: Progressing   Problem: Nutrition: Goal: Adequate nutrition will be maintained Outcome: Progressing   Problem: Elimination: Goal: Will not experience complications related to bowel motility Outcome: Progressing   Problem: Safety: Goal: Ability to remain free from injury will improve Outcome: Progressing   Problem: Skin Integrity: Goal: Risk for impaired skin integrity will decrease Outcome: Progressing

## 2018-09-27 NOTE — Progress Notes (Signed)
  Echocardiogram 2D Echocardiogram has been performed.  Jared Stevens 09/27/2018, 2:24 PM

## 2018-09-27 NOTE — ED Triage Notes (Signed)
From home with EMS c/o SOB. Hx of lung CA and COPD. Mild edema in his legs with increased SOB. A/O x 4, ambulatory. + cough, 98.1,  98% RA, HR 110, R 24, 128/90. CBG 104.Took his own nebulizer prior to EMS arrival, lungs are clear.

## 2018-09-27 NOTE — Procedures (Signed)
PROCEDURE SUMMARY:  Successful US guided right thoracentesis. Yielded 1.7 L of clear yellow fluid. Pt tolerated procedure well. No immediate complications.  Specimen was sent for labs. CXR ordered.  EBL < 5 mL  Ascencion Dike PA-C 09/27/2018 10:03 AM

## 2018-09-27 NOTE — ED Notes (Signed)
ED Provider at bedside for discussion of plan of care.

## 2018-09-27 NOTE — H&P (Signed)
History and Physical    Jared Stevens WJX:[id number] DOB: 12/28/1950 DOA: 09/27/2018  Referring MD/NP/PA: Gala Romney, MD PCP: Patient, No Pcp Per  Patient coming from:  Home   Chief Complaint: Shortness of breath  I have personally briefly reviewed patient's old medical records in [place]   HPI: Jared Stevens is a 68 y.o. male with medical history significant of COPD not on oxygen, HLD, adenocarcinoma of the lung followed by Dr. Earlie Server s/p chemo and radiation; who presents with complaints of shortness of breath for last 2 days.  He was just seen on April 9th, for the same.  At that time he received a thoracentesis and was discharged home. Noted associated symptoms of lower extremity swelling, orthopnea, dyspnea on exertion, and worsening cough.  Denies having any chest pain, weight loss, fever, abdominal pain, nausea, vomiting, or diarrhea symptoms.  He notes that after the thoracentesis last time his lower extremity swelling resolved on its own.  He has continued to smoke intermittently stating a pack of cigarettes can last him up to 4 days.  He reports that he is trying to quit, but states that it is harder than said.  Patient states that his appetite has been good and his weight is up to approximately 148 pounds.   ED Course: Upon admission patient noted to be afebrile, pulse 52-108, respirations 20-29, blood pressures 120/92-149/96, initial O2 saturations as low as 87% on room air, and O2 saturations improving to 92% on 2 L nasal cannula oxygen.  Labs revealed CBC within normal limits except for elevated RDW, albumin 3.3, AST 62, ALT 51, and BNP 1225.  Chest x-ray revealing moderate right-sided pleural effusion loculated that has reaccumulated.  Patient was given 20 mg of Lasix therapy.  TRH consulted and accepted as inpatient to a telemetry bed.  Review of Systems  Constitutional: Negative for chills and fever.  HENT: Negative for congestion and ear discharge.   Eyes: Negative  for photophobia and pain.  Respiratory: Positive for cough, sputum production, shortness of breath and wheezing. Negative for hemoptysis.   Cardiovascular: Positive for leg swelling. Negative for chest pain.  Gastrointestinal: Negative for abdominal pain, nausea and vomiting.  Genitourinary: Negative for dysuria and hematuria.  Musculoskeletal: Negative for back pain and falls.  Skin: Negative for itching.  Neurological: Negative for focal weakness, loss of consciousness and weakness.  Psychiatric/Behavioral: Negative for memory loss and suicidal ideas.    Past Medical History:  Diagnosis Date   COPD (chronic obstructive pulmonary disease) ([place])    Hypercholesteremia    Lung cancer ([facility] [place]) 03/2016    Past Surgical History:  Procedure Laterality Date   Arm Surgery     HERNIA REPAIR     REPLANTATION THUMB       reports that he has been smoking. He has never used smokeless tobacco. He reports previous alcohol use. He reports previous drug use.  No Known Allergies  Family History  Problem Relation Age of Onset   Aneurysm Mother     Prior to Admission medications   Medication Sig Start Date End Date Taking? Authorizing Provider  Albuterol Sulfate 2.5 MG/0.5ML NEBU Take 1 each by nebulization every 6 (six) hours as needed (for wheezing).  09/17/18  Yes [provider]  amitriptyline (ELAVIL) 50 MG tablet Take 50 mg by mouth at bedtime. 09/17/18  Yes [provider]  atorvastatin (LIPITOR) 20 MG tablet Take 20 mg by mouth daily at 6 PM.  04/22/18  Yes [provider]  ibuprofen (ADVIL,MOTRIN) 800 MG tablet Take 800 mg by mouth 3 (three) times daily as needed for pain. 07/19/18  Yes [provider]  latanoprost (XALATAN) 0.005 % ophthalmic solution Place 1 drop into both eyes 2 (two) times daily.  03/12/18  Yes [provider]  SYMBICORT 160-4.5 MCG/ACT inhaler Inhale 2 puffs into the lungs 2 (two) times daily. Morning & evening 08/21/18   Yes [provider]  VENTOLIN HFA 108 (90 Base) MCG/ACT inhaler Inhale 1 puff into the lungs every 6 (six) hours as needed for wheezing or shortness of breath.  04/11/18  Yes [provider]  doxycycline (VIBRA-TABS) 100 MG tablet Take 1 tablet (100 mg total) by mouth 2 (two) times daily. Patient not taking: Reported on 09/27/2018 08/26/18   Curt Bears, MD  HYDROcodone-acetaminophen (NORCO/VICODIN) 5-325 MG tablet Take 1 tablet by mouth every 4 (four) hours as needed. Patient not taking: Reported on 05/06/2018 11/13/17   Charlann Lange, PA-C  ibuprofen (ADVIL,MOTRIN) 600 MG tablet Take 1 tablet (600 mg total) by mouth every 6 (six) hours as needed. Patient not taking: Reported on 05/06/2018 11/13/17   Charlann Lange, PA-C  predniSONE (DELTASONE) 10 MG tablet Take 2 tablets (20 mg total) by mouth daily. Patient not taking: Reported on 09/05/2018 06/23/18   Wardell Honour, MD  varenicline (CHANTIX) 0.5 MG tablet Take 1 tablet (0.5 mg total) by mouth 2 (two) times daily. Patient not taking: Reported on 09/05/2018 08/20/18   Robyn Haber, MD    Physical Exam:  Constitutional: Elderly appearing male in some mild respiratory discomfort Vitals:   09/27/18 0550 09/27/18 0600 09/27/18 0645 09/27/18 0649  BP: 132/88 (!) 127/96  (!) 133/97  Pulse: (!) 105 (!) 101  (!) 103  Resp: (!) 23 (!) 24  20  Temp:    97.7 F (36.5 C)  TempSrc:    Oral  SpO2: 93% 95%  99%  Weight:   67.3 kg   Height:   6\' 1"  (1.854 m)    Eyes: PERRL, lids and conjunctivae normal ENMT: Mucous membranes are dry. Posterior pharynx clear of any exudate or lesions. Poor dentition.  Neck: normal, supple, no masses, no thyromegaly Respiratory: Tachypneic with decreased aeration most notably on the right mid to lower lung field.  Mild expiratory wheeze appreciated.  On 2 L nasal cannula oxygen.  Able to talk in nearly complete sentences. Cardiovascular: Regular rate and rhythm, no murmurs / rubs / gallops.  Trace  lower extremity edema. 2+ pedal pulses. No carotid bruits.  Abdomen: no tenderness, no masses palpated. No hepatosplenomegaly. Bowel sounds positive.  Musculoskeletal: Positive clubbing. No joint deformity upper and lower extremities. Good ROM, no contractures. Normal muscle tone.  Skin: no rashes, lesions, ulcers. No induration  Neurologic: CN 2-12 grossly intact. Sensation intact, DTR normal. Strength 5/5 in all 4.  Psychiatric: Normal judgment and insight. Alert and oriented x 3. Normal mood.     Labs on Admission: I have personally reviewed following labs and imaging studies  CBC: Recent Labs  Lab 09/27/18 0102  WBC 7.2  NEUTROABS 4.9  HGB 14.0  HCT 43.0  MCV 89.4  PLT 841   Basic Metabolic Panel: Recent Labs  Lab 09/27/18 0102  NA 142  K 4.0  CL 110  CO2 23  GLUCOSE 93  BUN 22  CREATININE 0.93  CALCIUM 8.9   GFR: Estimated Creatinine Clearance: 73.4 mL/min (by C-G formula based on SCr of 0.93 mg/dL). Liver Function Tests: Recent Labs  Lab 09/27/18  0102  AST 62*  ALT 51*  ALKPHOS 79  BILITOT 0.6  PROT 6.2*  ALBUMIN 3.3*   No results for input(s): LIPASE, AMYLASE in the last 168 hours. No results for input(s): AMMONIA in the last 168 hours. Coagulation Profile: No results for input(s): INR, PROTIME in the last 168 hours. Cardiac Enzymes: No results for input(s): CKTOTAL, CKMB, CKMBINDEX, TROPONINI in the last 168 hours. BNP (last 3 results) No results for input(s): PROBNP in the last 8760 hours. HbA1C: No results for input(s): HGBA1C in the last 72 hours. CBG: No results for input(s): GLUCAP in the last 168 hours. Lipid Profile: No results for input(s): CHOL, HDL, LDLCALC, TRIG, CHOLHDL, LDLDIRECT in the last 72 hours. Thyroid Function Tests: No results for input(s): TSH, T4TOTAL, FREET4, T3FREE, THYROIDAB in the last 72 hours. Anemia Panel: No results for input(s): VITAMINB12, FOLATE, FERRITIN, TIBC, IRON, RETICCTPCT in the last 72 hours. Urine  analysis:    Component Value Date/Time   COLORURINE YELLOW 09/05/2018 0917   APPEARANCEUR CLEAR 09/05/2018 0917   LABSPEC 1.023 09/05/2018 0917   PHURINE 5.0 09/05/2018 0917   GLUCOSEU NEGATIVE 09/05/2018 0917   HGBUR NEGATIVE 09/05/2018 0917   BILIRUBINUR NEGATIVE 09/05/2018 Morning Sun 09/05/2018 0917   PROTEINUR 30 (A) 09/05/2018 0917   NITRITE NEGATIVE 09/05/2018 0917   LEUKOCYTESUR NEGATIVE 09/05/2018 0917   Sepsis Labs: No results found for this or any previous visit (from the past 240 hour(s)).   Radiological Exams on Admission: Dg Chest 2 View  Result Date: 09/27/2018 CLINICAL DATA:  68 year old male with shortness of breath, lung cancer. EXAM: CHEST - 2 VIEW COMPARISON:  09/05/2018 and earlier. FINDINGS: PA and lateral views of the chest. Mild re-accumulation of the right pleural effusion is suspected since 09/05/2018. Moderate size effusion which appears at least partially loculated. Continued architectural distortion in the right lower lung. No superimposed pneumothorax or pulmonary edema. Stable cardiac size and mediastinal contours. Visualized tracheal air column is within normal limits. Stable left lung. Stable visualized osseous structures. Negative visible bowel gas pattern. IMPRESSION: 1. Moderate-sized and at least partially loculated right pleural effusion with some reaccumulation suspected since 09/05/2018. 2. Otherwise stable chest. Electronically Signed   By: Genevie Ann M.D.   On: 09/27/2018 01:31    EKG: Independently reviewed.  Sinus tachycardia at 106 bpm  Assessment/Plan Acute respiratory failure with hypoxia secondary to recurrent right-sided pleural effusion: Patient presents with complaints of worsening shortness of breath and lower extremity swelling.  Chest x-ray showing moderate sized partially loculated right-sided pleural effusion with reaccumulation from 4/9 when patient had thoracentesis.  Fluid analysis was noted to be transudate of in nature  at that time. -Admit to a telemetry bed -Continuous pulse oximetry with nasal cannula oxygen as needed  -Wean oxygen when able -Ultrasound guided thoracentesis with fluid analysis   Elevated BNP, suspected congestive heart failure: Acute.  BNP elevated at 1225.5, and previously 1010.6 earlier this month.  Suspect symptoms likely related with congestive heart failure and fluid analysis least by protein shows transudate if cause of symptoms. -Strict intake and output -Daily weights -Check TSH in a.m. -Check echocardiogram -Lasix 20 mg twice daily   -Consider need of inpatient cardiology consult versus ambulatory referral  History of lung cancer: Patient with history of stage IIIa non-small cell lung cancer (adenocarcinoma).  Patient status post concurrent chemoradiation therapy weekly carboplatin and paclitaxel completed in 2018.  Currently in observation followed by Dr. Earlie Server. -Notify Dr. Julien Nordmann that the patient was  admitted into the hospital  COPD, without exacerbation: Patient without acute wheezing noted on physical exam. -Continue pharmacy substitution of Dulera for Symbicort -Albuterol nebs as needed for shortness of breath/wheezing  Hyperlipidemia -Continue atorvastatin  Tobacco abuse: Patient reports still smoking cigarettes, but reports a will of wanting to quit. -Nicotine patch offered -Counseled on the need of cessation of tobacco  DVT prophylaxis: Lovenox Code Status: Full Family Communication: No family present at bedside Disposition Plan: Possible discharge home in 2 to 3 days Consults called: None Admission status: Inpatient   Norval Morton MD [facility] Pager [phone]   If 7PM-7AM, please contact night-coverage [email or web address] Password [email or web address]  09/27/2018, 7:11 AM

## 2018-09-27 NOTE — ED Notes (Signed)
ED TO INPATIENT HANDOFF REPORT  ED Nurse Name and Phone #: Elfreda Blanchet [phone]  S Name/Age/Gender Jared Stevens 68 y.o. male Room/Bed: 029C/029C  Code Status   Code Status: Not on file  Home/SNF/Other Home Patient oriented to: self, place, time and situation Is this baseline? Yes   Triage Complete: Triage complete  Chief Complaint shob  Triage Note From home with EMS c/o SOB. Hx of lung CA and COPD. Mild edema in his legs with increased SOB. A/O x 4, ambulatory. + cough, 98.1,  98% RA, HR 110, R 24, 128/90. CBG 104.Took his own nebulizer prior to EMS arrival, lungs are clear.    Allergies No Known Allergies  Level of Care/Admitting Diagnosis ED Disposition    ED Disposition Condition Comment   Admit  Hospital Area: [facility] [[id number]]  Level of Care: Telemetry Cardiac [103]  Covid Evaluation: N/A  Diagnosis: CHF exacerbation [facility]) [[id number]]  Admitting Physician: Elwyn Reach [[id number]]  Attending Physician: Elwyn Reach [[id number]]  Estimated length of stay: past midnight tomorrow  Certification:: I certify this patient will need inpatient services for at least 2 midnights  PT Class (Do Not Modify): Inpatient [101]  PT Acc Code (Do Not Modify): Private [1]       B Medical/Surgery History Past Medical History:  Diagnosis Date  . COPD (chronic obstructive pulmonary disease) ([place])   . Hypercholesteremia   . Lung cancer ([place]) 03/2016   Past Surgical History:  Procedure Laterality Date  . Arm Surgery    . HERNIA REPAIR    . REPLANTATION THUMB       A IV Location/Drains/Wounds Patient Lines/Drains/Airways Status   Active Line/Drains/Airways    Name:   Placement date:   Placement time:   Site:   Days:   Peripheral IV 09/27/18 Right Forearm   09/27/18    0554    Forearm   less than 1          Intake/Output Last 24 hours No intake or output data in the 24 hours ending 09/27/18 0609  Labs/Imaging Results for orders placed or performed during  the hospital encounter of 09/27/18 (from the past 48 hour(s))  CBC with Differential/Platelet     Status: Abnormal   Collection Time: 09/27/18  1:02 AM  Result Value Ref Range   WBC 7.2 4.0 - 10.5 K/uL   RBC 4.81 4.22 - 5.81 MIL/uL   Hemoglobin 14.0 13.0 - 17.0 g/dL   HCT 43.0 39.0 - 52.0 %   MCV 89.4 80.0 - 100.0 fL   MCH 29.1 26.0 - 34.0 pg   MCHC 32.6 30.0 - 36.0 g/dL   RDW 16.5 (H) 11.5 - 15.5 %   Platelets 320 150 - 400 K/uL   nRBC 0.0 0.0 - 0.2 %   Neutrophils Relative % 69 %   Neutro Abs 4.9 1.7 - 7.7 K/uL   Lymphocytes Relative 23 %   Lymphs Abs 1.7 0.7 - 4.0 K/uL   Monocytes Relative 6 %   Monocytes Absolute 0.5 0.1 - 1.0 K/uL   Eosinophils Relative 1 %   Eosinophils Absolute 0.1 0.0 - 0.5 K/uL   Basophils Relative 1 %   Basophils Absolute 0.1 0.0 - 0.1 K/uL   Immature Granulocytes 0 %   Abs Immature Granulocytes 0.01 0.00 - 0.07 K/uL    Comment: Performed at [facility], [address], [place], [place] [address]  Comprehensive metabolic panel     Status: Abnormal   Collection Time:  09/27/18  1:02 AM  Result Value Ref Range   Sodium 142 135 - 145 mmol/L   Potassium 4.0 3.5 - 5.1 mmol/L   Chloride 110 98 - 111 mmol/L   CO2 23 22 - 32 mmol/L   Glucose, Bld 93 70 - 99 mg/dL   BUN 22 8 - 23 mg/dL   Creatinine, Ser 0.93 0.61 - 1.24 mg/dL   Calcium 8.9 8.9 - 10.3 mg/dL   Total Protein 6.2 (L) 6.5 - 8.1 g/dL   Albumin 3.3 (L) 3.5 - 5.0 g/dL   AST 62 (H) 15 - 41 U/L   ALT 51 (H) 0 - 44 U/L   Alkaline Phosphatase 79 38 - 126 U/L   Total Bilirubin 0.6 0.3 - 1.2 mg/dL   GFR calc non Af Amer >60 >60 mL/min   GFR calc Af Amer >60 >60 mL/min   Anion gap 9 5 - 15    Comment: Performed at [facility] [address] [place], [place] [address]  Brain natriuretic peptide     Status: Abnormal   Collection Time: 09/27/18  1:03 AM  Result Value Ref Range   B Natriuretic Peptide 1,225.5 (H) 0.0 - 100.0 pg/mL    Comment: Performed at [facility] [address] [place], [place] [address]   Dg Chest 2 View  Result Date: 09/27/2018 CLINICAL DATA:  68 year old male with shortness of breath, lung cancer. EXAM: CHEST - 2 VIEW COMPARISON:  09/05/2018 and earlier. FINDINGS: PA and lateral views of the chest. Mild re-accumulation of the right pleural effusion is suspected since 09/05/2018. Moderate size effusion which appears at least partially loculated. Continued architectural distortion in the right lower lung. No superimposed pneumothorax or pulmonary edema. Stable cardiac size and mediastinal contours. Visualized tracheal air column is within normal limits. Stable left lung. Stable visualized osseous structures. Negative visible bowel gas pattern. IMPRESSION: 1. Moderate-sized and at least partially loculated right pleural effusion with some reaccumulation suspected since 09/05/2018. 2. Otherwise stable chest. Electronically Signed   By: Genevie Ann M.D.   On: 09/27/2018 01:31    Pending Labs Unresulted Labs (From admission, onward)   None      Vitals/Pain Today's Vitals   09/27/18 0230 09/27/18 0300 09/27/18 0400 09/27/18 0550  BP: (!) 135/96 (!) 135/93 (!) 149/96 132/88  Pulse: (!) 102 (!) 105 89 (!) 105  Resp:  (!) 22 (!) 24 (!) 23  Temp:      TempSrc:      SpO2: 95% 91% 94% 93%  PainSc:        Isolation Precautions No active isolations  Medications Medications  albuterol (VENTOLIN HFA) 108 (90 Base) MCG/ACT inhaler 6 puff (6 puffs Inhalation Given 09/27/18 0136)  ipratropium (ATROVENT HFA) inhaler 2 puff (2 puffs Inhalation Given 09/27/18 0231)  AeroChamber Plus Flo-Vu Large MISC (1 each  Given 09/27/18 0136)  furosemide (LASIX) injection 20 mg (20 mg Intravenous Given 09/27/18 0554)    Mobility walks Low fall risk   Focused Assessments Pulmonary Assessment Handoff:  Lung sounds: Bilateral Breath Sounds: Diminished L Breath Sounds: Expiratory wheezes R Breath Sounds: Expiratory wheezes O2 Device: Room Air         R Recommendations: See Admitting Provider Note  Report given to:   Additional Notes: none

## 2018-09-27 NOTE — ED Notes (Signed)
Education on lasix given. Urinal at bedside.

## 2018-09-27 NOTE — ED Provider Notes (Signed)
[facility] Provider Note  CSN: [id number] Arrival date & time: 09/27/18 0023  Chief Complaint(s) Shortness of Breath  HPI Jared Stevens is a 68 y.o. male history of COPD not on home oxygen, non-small cell lung cancer complicated by right-sided large pleural effusion that required thoracentesis earlier in the month presents to the emergency department with several days of shortness of breath and 1 day of bilateral lower extremity edema.  Patient reports this is similar to his prior encounter for pleural effusion.  He is endorsing orthopnea dyspnea on exertion.  Denies any associated chest pain.  Denies any recent fevers or infections.  Endorses dry cough.  No abdominal pain, nausea, vomiting, diarrhea.  Denies any urinary symptoms.  The history is provided by the patient.    Past Medical History Past Medical History:  Diagnosis Date  . COPD (chronic obstructive pulmonary disease) ([place])   . Hypercholesteremia   . Lung cancer ([place]) 03/2016   Patient Active Problem List   Diagnosis Date Noted  . CHF exacerbation ([place]) 09/27/2018  . Non-small cell lung cancer, right ([place]) 08/26/2018   Home Medication(s) Prior to Admission medications   Medication Sig Start Date End Date Taking? Authorizing Provider  Albuterol Sulfate 2.5 MG/0.5ML NEBU Take 1 each by nebulization every 6 (six) hours as needed (for wheezing).  09/17/18  Yes [provider]  amitriptyline (ELAVIL) 50 MG tablet Take 50 mg by mouth at bedtime. 09/17/18  Yes [provider]  atorvastatin (LIPITOR) 20 MG tablet Take 20 mg by mouth daily at 6 PM.  04/22/18  Yes [provider]  ibuprofen (ADVIL,MOTRIN) 800 MG tablet Take 800 mg by mouth 3 (three) times daily as needed for pain. 07/19/18  Yes [provider]  latanoprost (XALATAN) 0.005 % ophthalmic solution Place 1 drop into both eyes 2 (two) times daily.  03/12/18  Yes [provider]  SYMBICORT 160-4.5 MCG/ACT  inhaler Inhale 2 puffs into the lungs 2 (two) times daily. Morning & evening 08/21/18  Yes [provider]  VENTOLIN HFA 108 (90 Base) MCG/ACT inhaler Inhale 1 puff into the lungs every 6 (six) hours as needed for wheezing or shortness of breath.  04/11/18  Yes [provider]  doxycycline (VIBRA-TABS) 100 MG tablet Take 1 tablet (100 mg total) by mouth 2 (two) times daily. Patient not taking: Reported on 09/27/2018 08/26/18   Curt Bears, MD  HYDROcodone-acetaminophen (NORCO/VICODIN) 5-325 MG tablet Take 1 tablet by mouth every 4 (four) hours as needed. Patient not taking: Reported on 05/06/2018 11/13/17   Charlann Lange, PA-C  ibuprofen (ADVIL,MOTRIN) 600 MG tablet Take 1 tablet (600 mg total) by mouth every 6 (six) hours as needed. Patient not taking: Reported on 05/06/2018 11/13/17   Charlann Lange, PA-C  predniSONE (DELTASONE) 10 MG tablet Take 2 tablets (20 mg total) by mouth daily. Patient not taking: Reported on 09/05/2018 06/23/18   Wardell Honour, MD  varenicline (CHANTIX) 0.5 MG tablet Take 1 tablet (0.5 mg total) by mouth 2 (two) times daily. Patient not taking: Reported on 09/05/2018 08/20/18   Robyn Haber, MD  Past Surgical History Past Surgical History:  Procedure Laterality Date  . Arm Surgery    . HERNIA REPAIR    . REPLANTATION THUMB     Family History No family history on file.  Social History Social History   Tobacco Use  . Smoking status: Current Some Day Smoker  . Smokeless tobacco: Never Used  Substance Use Topics  . Alcohol use: Not Currently  . Drug use: Not Currently   Allergies Patient has no known allergies.  Review of Systems Review of Systems All other systems are reviewed and are negative for acute change except as noted in the HPI  Physical Exam Vital Signs  I have reviewed the triage vital signs  BP (!) 133/97   Pulse (!) 103   Temp 97.7 F (36.5 C) (Oral)   Resp 20   Ht 6\' 1"  (1.854 m)   Wt 67.3 kg   SpO2 99%   BMI 19.57 kg/m   Physical Exam Vitals signs reviewed.  Constitutional:      General: He is not in acute distress.    Appearance: He is well-developed. He is not diaphoretic.  HENT:     Head: Normocephalic and atraumatic.     Nose: Nose normal.  Eyes:     General: No scleral icterus.       Right eye: No discharge.        Left eye: No discharge.     Conjunctiva/sclera: Conjunctivae normal.     Pupils: Pupils are equal, round, and reactive to light.  Neck:     Musculoskeletal: Normal range of motion and neck supple.  Cardiovascular:     Rate and Rhythm: Normal rate and regular rhythm.     Heart sounds: No murmur. No friction rub. No gallop.   Pulmonary:     Effort: Pulmonary effort is normal. No respiratory distress.     Breath sounds: No stridor. Examination of the right-lower field reveals decreased breath sounds. Decreased breath sounds and wheezing (diffuse bilateral inspiratory next Tory wheezes) present. No rales.  Abdominal:     General: There is no distension.     Palpations: Abdomen is soft.     Tenderness: There is no abdominal tenderness.  Musculoskeletal:        General: No tenderness.  Skin:    General: Skin is warm and dry.     Findings: No erythema or rash.  Neurological:     Mental Status: He is alert and oriented to person, place, and time.     ED Results and Treatments Labs (all labs ordered are listed, but only abnormal results are displayed) Labs Reviewed  CBC WITH DIFFERENTIAL/PLATELET - Abnormal; Notable for the following components:      Result Value   RDW 16.5 (*)    All other components within normal limits  BRAIN NATRIURETIC PEPTIDE - Abnormal; Notable for the following components:   B Natriuretic Peptide 1,225.5 (*)    All other components within normal limits  COMPREHENSIVE METABOLIC PANEL - Abnormal; Notable for the  following components:   Total Protein 6.2 (*)    Albumin 3.3 (*)    AST 62 (*)    ALT 51 (*)    All other components within normal limits  EKG  EKG Interpretation  Date/Time:  Friday Sep 27 2018 00:39:02 EDT Ventricular Rate:  106 PR Interval:    QRS Duration: 98 QT Interval:  359 QTC Calculation: 477 R Axis:   63 Text Interpretation:  Sinus tachycardia Nonspecific T abnormalities, lateral leads Borderline prolonged QT interval No significant change since last tracing Confirmed by Addison Lank [phone]) on 09/27/2018 1:55:54 AM      Radiology Dg Chest 2 View  Result Date: 09/27/2018 CLINICAL DATA:  68 year old male with shortness of breath, lung cancer. EXAM: CHEST - 2 VIEW COMPARISON:  09/05/2018 and earlier. FINDINGS: PA and lateral views of the chest. Mild re-accumulation of the right pleural effusion is suspected since 09/05/2018. Moderate size effusion which appears at least partially loculated. Continued architectural distortion in the right lower lung. No superimposed pneumothorax or pulmonary edema. Stable cardiac size and mediastinal contours. Visualized tracheal air column is within normal limits. Stable left lung. Stable visualized osseous structures. Negative visible bowel gas pattern. IMPRESSION: 1. Moderate-sized and at least partially loculated right pleural effusion with some reaccumulation suspected since 09/05/2018. 2. Otherwise stable chest. Electronically Signed   By: Genevie Ann M.D.   On: 09/27/2018 01:31   Pertinent labs & imaging results that were available during my care of the patient were reviewed by me and considered in my medical decision making (see chart for details).  Medications Ordered in ED Medications  furosemide (LASIX) injection 20 mg (has no administration in time range)  atorvastatin (LIPITOR) tablet 20 mg (has no administration in time  range)  latanoprost (XALATAN) 0.005 % ophthalmic solution 1 drop (has no administration in time range)  amitriptyline (ELAVIL) tablet 50 mg (has no administration in time range)  mometasone-formoterol (DULERA) 200-5 MCG/ACT inhaler 2 puff (has no administration in time range)  nicotine (NICODERM CQ - dosed in mg/24 hours) patch 21 mg (has no administration in time range)  enoxaparin (LOVENOX) injection 40 mg (has no administration in time range)  sodium chloride flush (NS) 0.9 % injection 3 mL (has no administration in time range)  ondansetron (ZOFRAN) tablet 4 mg (has no administration in time range)    Or  ondansetron (ZOFRAN) injection 4 mg (has no administration in time range)  acetaminophen (TYLENOL) tablet 650 mg (has no administration in time range)    Or  acetaminophen (TYLENOL) suppository 650 mg (has no administration in time range)  HYDROcodone-acetaminophen (NORCO/VICODIN) 5-325 MG per tablet 1 tablet (has no administration in time range)  albuterol (PROVENTIL) (2.5 MG/3ML) 0.083% nebulizer solution 2.5 mg (has no administration in time range)  albuterol (VENTOLIN HFA) 108 (90 Base) MCG/ACT inhaler 6 puff (6 puffs Inhalation Given 09/27/18 0136)  ipratropium (ATROVENT HFA) inhaler 2 puff (2 puffs Inhalation Given 09/27/18 0231)  AeroChamber Plus Flo-Vu Large MISC (1 each  Given 09/27/18 0136)  furosemide (LASIX) injection 20 mg (20 mg Intravenous Given 09/27/18 0554)  Procedures Procedures  (including critical care time)  Medical Decision Making / ED Course I have reviewed the nursing notes for this encounter and the patient's prior records (if available in EHR or on provided paperwork).    Patient is afebrile with stable vital signs, satting well on room air though has increased work of breathing with minimal activity.  Chest x-ray notable for right  pleural effusion with partial loculation.  No other focal opacities concerning for pneumonia.  EKG without acute ischemic changes.  BNP greater than 1200.  No leukocytosis or anemia.  No significant electrolyte derangements or renal insufficiency.  Patient was provided with albuterol and Atrovent which completely resolved his wheezing.  However patient still remained increased work of breathing with minimal activity.  Patient has not had an echocardiogram during his work-up.  Given the evidence of volume overload with pleural effusions and elevated BNP, patient was given Lasix for diuresing.  Case discussed with medicine for admission and continued work-up to assess for possible heart failure.  During admission patient can also have IR guided thoracentesis.  Final Clinical Impression(s) / ED Diagnoses Final diagnoses:  SOB (shortness of breath)  COPD exacerbation (HCC)  Pleural effusion      This chart was dictated using voice recognition software.  Despite best efforts to proofread,  errors can occur which can change the documentation meaning.   Fatima Blank, MD 09/27/18 [phone]

## 2018-09-27 NOTE — ED Notes (Signed)
Pt to imaging

## 2018-09-28 DIAGNOSIS — J90 Pleural effusion, not elsewhere classified: Secondary | ICD-10-CM

## 2018-09-28 DIAGNOSIS — J438 Other emphysema: Secondary | ICD-10-CM

## 2018-09-28 DIAGNOSIS — I5021 Acute systolic (congestive) heart failure: Secondary | ICD-10-CM

## 2018-09-28 LAB — CBC
HCT: 40.8 % (ref 39.0–52.0)
Hemoglobin: 13.6 g/dL (ref 13.0–17.0)
MCH: 29.3 pg (ref 26.0–34.0)
MCHC: 33.3 g/dL (ref 30.0–36.0)
MCV: 87.9 fL (ref 80.0–100.0)
Platelets: 298 10*3/uL (ref 150–400)
RBC: 4.64 MIL/uL (ref 4.22–5.81)
RDW: 16.2 % — ABNORMAL HIGH (ref 11.5–15.5)
WBC: 6.4 10*3/uL (ref 4.0–10.5)
nRBC: 0 % (ref 0.0–0.2)

## 2018-09-28 LAB — BASIC METABOLIC PANEL
Anion gap: 6 (ref 5–15)
BUN: 19 mg/dL (ref 8–23)
CO2: 27 mmol/L (ref 22–32)
Calcium: 8.5 mg/dL — ABNORMAL LOW (ref 8.9–10.3)
Chloride: 107 mmol/L (ref 98–111)
Creatinine, Ser: 0.85 mg/dL (ref 0.61–1.24)
GFR calc Af Amer: >60 mL/min (ref >60)
GFR calc non Af Amer: >60 mL/min (ref >60)
Glucose, Bld: 87 mg/dL (ref 70–99)
Potassium: 4 mmol/L (ref 3.5–5.1)
Sodium: 140 mmol/L (ref 135–145)

## 2018-09-28 LAB — TSH: TSH: 0.789 u[IU]/mL (ref 0.350–4.500)

## 2018-09-28 NOTE — Progress Notes (Signed)
PROGRESS NOTE  Jared Stevens ELF:[id number] DOB: 10-07-1950 DOA: 09/27/2018 PCP: Patient, No Pcp Per  HPI/Recap of past 24 hours:  Lower extremity edema resolved, breathing better after thoracentesis, reports cough has resolved Denies chest pain, no fever,   Assessment/Plan: Principal Problem:   CHF exacerbation (HCC) Active Problems:   History of lung cancer   Acute on chronic respiratory failure with hypoxia (HCC)   Elevated brain natriuretic peptide (BNP) level   COPD (chronic obstructive pulmonary disease) (HCC)   Hyperlipidemia   Tobacco abuse  New diagnosis of heart failure/acute hypoxic respiratory failure/recurrent right sided pleural effusion -Presented with sob, hypoxia and bilateral lower extremity edema ( he reports started to have edema a month ago) -Denies h/o chest pain -Echo lvef 15%, patient reports no prior cardiac history,  -currently on lasix, bp low normal , now able to tolerate betablocker -cardiology consulted  -H/o stage III lung cancer ( right) reports finished treatment with concurrent chemo/XRT in 2018 Follows with Dr Anibal Henderson Pleural fluids cytology negative on 4/9  COPD; stable, no wheezing  Reports intermittent cocaine use, last use a month ago Reports rarely drink alcohol Reports continue smoke but cutting down  Code Status: full ,confirmed with the patient  Family Communication: patient   Disposition Plan: not ready to discharge, needs cardiology clearance   Consultants:  IR  cardiology  Procedures: US guided right thoracentesis.  Yielded 1.7 L of clear yellow fluid.  Antibiotics:  none   Objective: BP 100/70 (BP Location: Left Arm)   Pulse 100   Temp 98.2 F (36.8 C) (Oral)   Resp 18   Ht 6\' 1"  (1.854 m)   Wt 64.8 kg   SpO2 98%   BMI 18.85 kg/m   Intake/Output Summary (Last 24 hours) at 09/28/2018 1508 Last data filed at 09/28/2018 1222 Gross per 24 hour  Intake 1060 ml  Output 3200 ml  Net -2140 ml   Filed  Weights   09/27/18 0645 09/28/18 0449  Weight: 67.3 kg 64.8 kg    Exam: Patient is examined daily including today on 09/28/2018, exams remain the same as of yesterday except that has changed    General:  Thin, poor dentition, NAD  Cardiovascular: RRR  Respiratory: diminished on the right, clear on the left  Abdomen: Soft/ND/NT, positive BS  Musculoskeletal: No Edema  Neuro: alert, oriented   Data Reviewed: Basic Metabolic Panel: Recent Labs  Lab 09/27/18 0102 09/28/18 0221  NA 142 140  K 4.0 4.0  CL 110 107  CO2 23 27  GLUCOSE 93 87  BUN 22 19  CREATININE 0.93 0.85  CALCIUM 8.9 8.5*   Liver Function Tests: Recent Labs  Lab 09/27/18 0102  AST 62*  ALT 51*  ALKPHOS 79  BILITOT 0.6  PROT 6.2*  ALBUMIN 3.3*   No results for input(s): LIPASE, AMYLASE in the last 168 hours. No results for input(s): AMMONIA in the last 168 hours. CBC: Recent Labs  Lab 09/27/18 0102 09/28/18 0221  WBC 7.2 6.4  NEUTROABS 4.9  --   HGB 14.0 13.6  HCT 43.0 40.8  MCV 89.4 87.9  PLT 320 298   Cardiac Enzymes:   No results for input(s): CKTOTAL, CKMB, CKMBINDEX, TROPONINI in the last 168 hours. BNP (last 3 results) Recent Labs    09/05/18 0804 09/27/18 0103  BNP 1,010.6* 1,225.5*    ProBNP (last 3 results) No results for input(s): PROBNP in the last 8760 hours.  CBG: No results for input(s): GLUCAP in the last  168 hours.  Recent Results (from the past 240 hour(s))  Gram stain     Status: None   Collection Time: 09/27/18  9:49 AM  Result Value Ref Range Status   Specimen Description PLEURAL RIGHT  Final   Special Requests NONE  Final   Gram Stain   Final    RARE WBC PRESENT, PREDOMINANTLY PMN NO ORGANISMS SEEN Performed at [facility], [address], [place], [place] [address]    Report Status 09/27/2018 FINAL  Final  Culture, body fluid-bottle     Status: None (Preliminary result)   Collection Time: 09/27/18  9:49 AM  Result Value Ref Range Status    Specimen Description PLEURAL RIGHT  Final   Special Requests NONE  Final   Culture   Final    NO GROWTH < 24 HOURS Performed at [facility], [place] [address] [place], [place] [address]    Report Status PENDING  Incomplete     Studies: No results found.  Scheduled Meds: . amitriptyline  50 mg Oral QHS  . atorvastatin  20 mg Oral q1800  . enoxaparin (LOVENOX) injection  40 mg Subcutaneous Q24H  . furosemide  20 mg Intravenous BID  . latanoprost  1 drop Both Eyes BID  . mometasone-formoterol  2 puff Inhalation BID  . nicotine  21 mg Transdermal Daily  . sodium chloride flush  3 mL Intravenous Q12H    Continuous Infusions:   Time spent: 42mins I have personally reviewed and interpreted on  09/28/2018 daily labs, tele strips, imagings as discussed above under date review session and assessment and plans.  I reviewed all nursing notes, pharmacy notes, consultant notes,  vitals, pertinent old records  I have discussed plan of care as described above with RN , patient  on 09/28/2018   Florencia Reasons MD, PhD  [facility] Pager [phone]. If 7PM-7AM, please contact night-coverage at [email or web address], password [facility] 09/28/2018, 3:08 PM  LOS: 1 day

## 2018-09-29 DIAGNOSIS — I509 Heart failure, unspecified: Secondary | ICD-10-CM

## 2018-09-29 LAB — COMPREHENSIVE METABOLIC PANEL
ALT: 35 U/L (ref 0–44)
AST: 31 U/L (ref 15–41)
Albumin: 2.7 g/dL — ABNORMAL LOW (ref 3.5–5.0)
Alkaline Phosphatase: 73 U/L (ref 38–126)
Anion gap: 8 (ref 5–15)
BUN: 14 mg/dL (ref 8–23)
CO2: 28 mmol/L (ref 22–32)
Calcium: 8.5 mg/dL — ABNORMAL LOW (ref 8.9–10.3)
Chloride: 106 mmol/L (ref 98–111)
Creatinine, Ser: 0.9 mg/dL (ref 0.61–1.24)
GFR calc Af Amer: >60 mL/min (ref >60)
GFR calc non Af Amer: >60 mL/min (ref >60)
Glucose, Bld: 97 mg/dL (ref 70–99)
Potassium: 3.7 mmol/L (ref 3.5–5.1)
Sodium: 142 mmol/L (ref 135–145)
Total Bilirubin: 0.6 mg/dL (ref 0.3–1.2)
Total Protein: 5.6 g/dL — ABNORMAL LOW (ref 6.5–8.1)

## 2018-09-29 LAB — MAGNESIUM: Magnesium: 1.9 mg/dL (ref 1.7–2.4)

## 2018-09-29 NOTE — Progress Notes (Signed)
PROGRESS NOTE  Jared Stevens WLN:[id number] DOB: 09-Mar-1951 DOA: 09/27/2018 PCP: Patient, No Pcp Per  HPI/Recap of past 24 hours:  3liters urine output last 24hrs, cr 0.9, bp low normal   Denies chest pain, no fever, no room air at rest, no edema, no cough   Assessment/Plan: Principal Problem:   CHF exacerbation (HCC) Active Problems:   History of lung cancer   Acute on chronic respiratory failure with hypoxia (HCC)   Elevated brain natriuretic peptide (BNP) level   COPD (chronic obstructive pulmonary disease) (HCC)   Hyperlipidemia   Tobacco abuse   Recurrent right pleural effusion   Acute systolic CHF (congestive heart failure) (HCC)  New diagnosis of heart failure/acute hypoxic respiratory failure/recurrent right sided pleural effusion -Presented with sob, hypoxia and bilateral lower extremity edema ( he reports started to have edema a month ago) -Denies h/o chest pain -Echo lvef 15%, patient reports no prior cardiac history,  -currently on lasix, bp low normal , now able to tolerate betablocker -cardiology consulted  Elevated lft Likely from liver congestion, resolved.  -H/o stage III lung cancer ( right) reports finished treatment with concurrent chemo/XRT in 2018 Follows with Dr Anibal Henderson Pleural fluids cytology negative on 4/9  COPD; stable, no wheezing  Reports intermittent cocaine use, last use a month ago Reports rarely drink alcohol Reports continue smoke but cutting down  Code Status: full ,confirmed with the patient  Family Communication: patient   Disposition Plan: not ready to discharge, needs cardiology clearance   Consultants:  IR  cardiology  Procedures: US guided right thoracentesis.  Yielded 1.7 L of clear yellow fluid.  Antibiotics:  none   Objective: BP 99/77 (BP Location: Left Arm)   Pulse 99   Temp 97.8 F (36.6 C) (Oral)   Resp 18   Ht 6\' 1"  (1.854 m)   Wt 62.5 kg   SpO2 97%   BMI 18.18 kg/m   Intake/Output Summary  (Last 24 hours) at 09/29/2018 0745 Last data filed at 09/29/2018 7408 Gross per 24 hour  Intake 720 ml  Output 3000 ml  Net -2280 ml   Filed Weights   09/27/18 0645 09/28/18 0449 09/29/18 0647  Weight: 67.3 kg 64.8 kg 62.5 kg    Exam: Patient is examined daily including today on 09/29/2018, exams remain the same as of yesterday except that has changed    General:  Thin, poor dentition, NAD  Cardiovascular: RRR  Respiratory: diminished on the right, clear on the left  Abdomen: Soft/ND/NT, positive BS  Musculoskeletal: No Edema  Neuro: alert, oriented , poor insight   Data Reviewed: Basic Metabolic Panel: Recent Labs  Lab 09/27/18 0102 09/28/18 0221 09/29/18 0449  NA 142 140 142  K 4.0 4.0 3.7  CL 110 107 106  CO2 23 27 28   GLUCOSE 93 87 97  BUN 22 19 14   CREATININE 0.93 0.85 0.90  CALCIUM 8.9 8.5* 8.5*  MG  --   --  1.9   Liver Function Tests: Recent Labs  Lab 09/27/18 0102 09/29/18 0449  AST 62* 31  ALT 51* 35  ALKPHOS 79 73  BILITOT 0.6 0.6  PROT 6.2* 5.6*  ALBUMIN 3.3* 2.7*   No results for input(s): LIPASE, AMYLASE in the last 168 hours. No results for input(s): AMMONIA in the last 168 hours. CBC: Recent Labs  Lab 09/27/18 0102 09/28/18 0221  WBC 7.2 6.4  NEUTROABS 4.9  --   HGB 14.0 13.6  HCT 43.0 40.8  MCV 89.4 87.9  PLT 320 298   Cardiac Enzymes:   No results for input(s): CKTOTAL, CKMB, CKMBINDEX, TROPONINI in the last 168 hours. BNP (last 3 results) Recent Labs    09/05/18 0804 09/27/18 0103  BNP 1,010.6* 1,225.5*    ProBNP (last 3 results) No results for input(s): PROBNP in the last 8760 hours.  CBG: No results for input(s): GLUCAP in the last 168 hours.  Recent Results (from the past 240 hour(s))  Gram stain     Status: None   Collection Time: 09/27/18  9:49 AM  Result Value Ref Range Status   Specimen Description PLEURAL RIGHT  Final   Special Requests NONE  Final   Gram Stain   Final    RARE WBC PRESENT, PREDOMINANTLY  PMN NO ORGANISMS SEEN Performed at [facility], [address], [place], [place] [address]    Report Status 09/27/2018 FINAL  Final  Culture, body fluid-bottle     Status: None (Preliminary result)   Collection Time: 09/27/18  9:49 AM  Result Value Ref Range Status   Specimen Description PLEURAL RIGHT  Final   Special Requests NONE  Final   Culture   Final    NO GROWTH < 24 HOURS Performed at [facility], [place] [address], [place],  [address]    Report Status PENDING  Incomplete     Studies: No results found.  Scheduled Meds: . amitriptyline  50 mg Oral QHS  . atorvastatin  20 mg Oral q1800  . enoxaparin (LOVENOX) injection  40 mg Subcutaneous Q24H  . furosemide  20 mg Intravenous BID  . latanoprost  1 drop Both Eyes BID  . mometasone-formoterol  2 puff Inhalation BID  . nicotine  21 mg Transdermal Daily  . sodium chloride flush  3 mL Intravenous Q12H    Continuous Infusions:   Time spent: 43mins I have personally reviewed and interpreted on  09/29/2018 daily labs, tele strips, imagings as discussed above under date review session and assessment and plans.  I reviewed all nursing notes, pharmacy notes, consultant notes,  vitals, pertinent old records  I have discussed plan of care as described above with RN , patient  on 09/29/2018   Florencia Reasons MD, PhD  [facility] Pager [phone]. If 7PM-7AM, please contact night-coverage at [email or web address], password [facility] 09/29/2018, 7:45 AM  LOS: 2 days

## 2018-09-29 NOTE — Consult Note (Signed)
Cardiology Consultation:   Patient ID: Jared Stevens MRN: [id number]; DOB: 05-24-1951  Admit date: 09/27/2018 Date of Consult: 09/29/2018  Primary Care Provider: Patient, No Pcp Per Primary Cardiologist: NEW   Patient Profile:   Jared Stevens is a 68 y.o. male with a hx of nonsmall cell lung CA who is being seen today for the evaluation of CHF at the request of Dr Erlinda Hong.  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 He was seen again in ED on 09/05/18 with SOB.  Still complained of cough, SOB.  Giving out easy Noted to have LE edema at that visit  Underwent thoracentesis of large R pleural effusion (750 cc straw colored fluid)   The pt was admitted 09/27/18  for complaints of SOB    Echo on 5/1//20   LVEF 15%  LV moderately dilated  RVEF severely reduced.  He is unaware of prior echos.  He denies prior MI or CV procedures.  On Admit, BNP 1225, CXR with R pleural effusion that was loculated.   He underwent US guided thoracenteisis with 1.7 L clear yellow fluid obtained Since admit he has received 20 mg IV lasix bid and has net negative 6.47 L   Note:  Chest CT from 09/05/18 listed below  Past Medical History:  Diagnosis Date   COPD (chronic obstructive pulmonary disease) ([place])    Hypercholesteremia    Lung cancer ([place]) 03/2016    Past Surgical History:  Procedure Laterality Date   Arm Surgery     HERNIA REPAIR     IR THORACENTESIS ASP PLEURAL SPACE W/IMG GUIDE  09/27/2018   REPLANTATION THUMB        Inpatient Medications: Scheduled Meds:  amitriptyline  50 mg Oral QHS   atorvastatin  20 mg Oral q1800   enoxaparin (LOVENOX) injection  40 mg Subcutaneous Q24H   furosemide  20 mg Intravenous BID    latanoprost  1 drop Both Eyes BID   mometasone-formoterol  2 puff Inhalation BID   nicotine  21 mg Transdermal Daily   sodium chloride flush  3 mL Intravenous Q12H   Continuous Infusions:  PRN Meds: acetaminophen **OR** acetaminophen, albuterol, HYDROcodone-acetaminophen, lidocaine, ondansetron **OR** ondansetron (ZOFRAN) IV  Allergies:   No Known Allergies  Social History:   Social History   Socioeconomic History   Marital status: Single    Spouse name: Not on file   Number of children: Not on file   Years of education: Not on file   Highest education level: Not on file  Occupational History   Not on file  Social Needs   Financial resource strain: Not on file   Food insecurity:    Worry: Not on file    Inability: Not on file   Transportation needs:    Medical: Not on file    Non-medical: Not on file  Tobacco Use   Smoking status: Current Some Day Smoker   Smokeless tobacco: Never Used  Substance and Sexual Activity   Alcohol use: Not Currently   Drug use: Not Currently   Sexual activity: Not on file  Lifestyle   Physical activity:    Days per week: Not on file    Minutes per session: Not on file   Stress: Not on file  Relationships   Social connections:    Talks on phone: Not on file    Gets together: Not on file    Attends religious service: Not on file    Active member of club or organization: Not on file    Attends meetings of clubs or organizations: Not on file    Relationship status: Not on file   Intimate partner violence:    Fear of current or ex partner: Not on file    Emotionally abused: Not on file    Physically abused: Not on file    Forced sexual activity: Not on file  Other Topics Concern   Not on file  Social History Narrative   Not on file    Family History:   Family History  Problem Relation Age of Onset   Aneurysm Mother      ROS:  Please see the history of present illness.  All other ROS reviewed and  negative.     Physical Exam/Data:   Vitals:   09/28/18 2029 09/28/18 2033 09/29/18 0646 09/29/18 0647  BP: 114/81  99/77   Pulse: (!) 103 (!) 103 99   Resp: 18 16 18    Temp: 98.1 F (36.7 C)  97.8 F (36.6 C)   TempSrc: Oral  Oral   SpO2: 97% 97% 97%   Weight:    62.5 kg  Height:        Intake/Output Summary (Last 24 hours) at 09/29/2018 0804 Last data filed at 09/29/2018 8338 Gross per 24 hour  Intake 720 ml  Output 3000 ml  Net -2280 ml   Last 3 Weights 09/29/2018 09/28/2018 09/27/2018  Weight (lbs) 137 lb 12.8 oz 142 lb 14.4 oz 148 lb 4.8 oz  Weight (kg) 62.506 kg 64.819 kg 67.268 kg     Exam limited due to COVID 19 social distancing  Body mass index is 18.18 kg/m.  General:  Thin, in no acute distress  HEENT: normal Lymph: no adenopathy Neck: no JVD Cardiac:  RRR Lungs:  Not tachypneic Ext: no edema Musculoskeletal:  No deformities, BUE and BLE strength normal and equal Skin: warm and dry  Neuro:  CNs 2-12 intact, no focal abnormalities noted Psych:  Normal affect, verbose, circumferential and requires frequent redirecting.  EKG:  The EKG was personally reviewed and demonstrates  5/1/2-: SInus tachycardia 106 bpm  INcomplete RBBB Telemetry:  Telemetry was personally reviewed and demonstrates:  Sinus rhythm  Relevant CV Studies: Echo  ECHOCARDIOGRAM REPORT       Patient Name:   Jared Stevens Date of Exam: 09/27/2018 Medical Rec #:  [id number]     Height:       73.0 in Accession #:    [id number]    Weight:       148.3 lb Date of Birth:  05/30/1950     BSA:          1.89 m Patient Age:    54 years      BP:           93/62 mmHg Patient Gender: M             HR:           100 bpm. Exam Location:  Inpatient    Procedure: Limited Echo  Indications:    Congestive Heart Failure 428.0/I50.9  History:        Patient has no prior history of Echocardiogram examinations.                 COPD.   Sonographer:    Clayton Lefort RDCS (AE) Referring Phys: [id number] [facility] A  SMITH    Sonographer Comments: Respiratory motion. IMPRESSIONS    1. The left ventricle has a visually estimated ejection fraction of 15%. The cavity size was moderately dilated. Left ventricular diastolic Doppler parameters are consistent with impaired relaxation.  2. The right ventricle has severely reduced systolic function. The cavity was normal. There is no increase in right ventricular wall thickness.  3. Trivial pericardial effusion is present.  4. The mitral valve is abnormal. Mild thickening of the mitral valve leaflet. There is mild mitral annular calcification present.  5. The aortic valve is tricuspid. Mild thickening of the aortic valve.  FINDINGS  Left Ventricle: The left ventricle has a visually estimated ejection fraction of 15. The cavity size was moderately dilated. There is no increase in left ventricular wall thickness. Left ventricular diastolic Doppler parameters are consistent with  impaired relaxation.  Right Ventricle: The right ventricle has severely reduced systolic function. The cavity was normal. There is no increase in right ventricular wall thickness.  Left Atrium: Left atrial size was normal in size.  Right Atrium: Right atrial size was normal in size. Right atrial pressure is estimated at 15 mmHg.  Interatrial Septum: No atrial level shunt detected by color flow Doppler.  Pericardium: Trivial pericardial effusion is present.  Mitral Valve: The mitral valve is abnormal. Mild thickening of the mitral valve leaflet. There is mild mitral annular calcification present. Mitral valve regurgitation is mild by color flow Doppler.  Tricuspid Valve: The tricuspid valve is normal in structure. Tricuspid valve regurgitation is mild by color flow Doppler.  Aortic Valve: The aortic valve is tricuspid Mild thickening of the aortic valve. Aortic valve regurgitation was not visualized by color flow Doppler.  Pulmonic Valve: The pulmonic valve was not well  visualized. Pulmonic valve regurgitation is not visualized by color flow Doppler.    +--------------+-------++  LEFT VENTRICLE           +--------------+-------++  PLAX 2D                  +--------------+-------++  LV EF:         15 %      +--------------+-------++  LVIDd:         5.60 cm   +--------------+-------++  LVIDs:         4.90 cm   +--------------+-------++  LV PW:         1.10 cm   +--------------+-------++  LV IVS:        1.10 cm   +--------------+-------++  LV SV:         41 ml     +--------------+-------++  LV SV Index:   22.09     +--------------+-------++                           +--------------+-------++   +------------------+---------++  LV Volumes (MOD)               +------------------+---------++  LV area d, A2C:    52.10 cm   +------------------+---------++  LV area d, A4C:    43.60 cm   +------------------+---------++  LV area s, A2C:    44.40 cm   +------------------+---------++  LV  area s, A4C:    40.00 cm   +------------------+---------++  LV major d, A2C:   10.00 cm    +------------------+---------++  LV major d, A4C:   10.10 cm    +------------------+---------++  LV major s, A2C:   9.17 cm     +------------------+---------++  LV major s, A4C:   10.30 cm    +------------------+---------++  LV vol d, MOD A2C: 225.0 ml    +------------------+---------++  LV vol d, MOD A4C: 155.0 ml    +------------------+---------++  LV vol s, MOD A2C: 174.0 ml    +------------------+---------++  LV vol s, MOD A4C: 130.0 ml    +------------------+---------++  LV SV MOD A2C:     51.0 ml     +------------------+---------++  LV SV MOD A4C:     155.0 ml    +------------------+---------++  LV SV MOD BP:      27.7 ml     +------------------+---------++  +-----------+-------++----------++  LEFT ATRIUM          Index        +-----------+-------++----------++  LA diam:    4.50 cm  2.37 cm/m   +-----------+-------++----------++      +-------------+-------++  AORTA                   +-------------+-------++  Ao Root diam: 2.80 cm   +-------------+-------++  +----------------+-----------++ +---------------+-----------++  MR Peak grad:    62.7 mmHg      TRICUSPID VALVE               +----------------+-----------++ +---------------+-----------++  MR Mean grad:    37.0 mmHg      TR Peak grad:   17.3 mmHg     +----------------+-----------++ +---------------+-----------++  MR Vmax:         396.00 cm/s    TR Vmax:        208.00 cm/s   +----------------+-----------++ +---------------+-----------++  MR Vmean:        283.0 cm/s    +----------------+-----------++  MR PISA:         1.01 cm      +----------------+-----------++  MR PISA Eff ROA: 9 mm         +----------------+-----------++  MR PISA Radius:  0.40 cm       +----------------+-----------++ +--------------+----------++  MV A velocity: 82.50 cm/s   +--------------+----------++    Dorris Carnes MD Electronically signed by Dorris Carnes MD Signature Date/Time: 09/27/2018/6:04:00 PM      Laboratory Data:  Chemistry Recent Labs  Lab 09/27/18 0102 09/28/18 0221 09/29/18 0449  NA 142 140 142  K 4.0 4.0 3.7  CL 110 107 106  CO2 23 27 28   GLUCOSE 93 87 97  BUN 22 19 14   CREATININE 0.93 0.85 0.90  CALCIUM 8.9 8.5* 8.5*  GFRNONAA >60 >60 >60  GFRAA >60 >60 >60  ANIONGAP 9 6 8     Recent Labs  Lab 09/27/18 0102 09/29/18 0449  PROT 6.2* 5.6*  ALBUMIN 3.3* 2.7*  AST 62* 31  ALT 51* 35  ALKPHOS 79 73  BILITOT 0.6 0.6   Hematology Recent Labs  Lab 09/27/18 0102 09/28/18 0221  WBC 7.2 6.4  RBC 4.81 4.64  HGB 14.0 13.6  HCT 43.0 40.8  MCV 89.4 87.9  MCH 29.1 29.3  MCHC 32.6 33.3  RDW 16.5* 16.2*  PLT 320 298   Cardiac EnzymesNo results for input(s): TROPONINI in the last 168 hours. No results for input(s): TROPIPOC in the last 168 hours.  BNP Recent Labs  Lab 09/27/18 0103  BNP 1,225.5*    DDimer No results for input(s): DDIMER in  the last 168 hours.  Radiology/Studies:  Dg Chest 1 View  Result Date: 09/27/2018 CLINICAL DATA:  Status post right thoracentesis. EXAM: CHEST  1 VIEW COMPARISON:  Radiographs of same day. FINDINGS: Stable cardiomegaly. Left lung is clear. No pneumothorax is noted. Stable moderate size loculated right pleural effusion is noted. Bony thorax is unremarkable. IMPRESSION: No pneumothorax is noted status post thoracentesis. Grossly stable moderate size loculated right pleural effusion is noted. Electronically Signed   By: Marijo Conception M.D.   On: 09/27/2018 10:18   Dg Chest 2 View  Result Date: 09/27/2018 CLINICAL DATA:  68 year old male with shortness of breath, lung cancer. EXAM: CHEST - 2 VIEW COMPARISON:  09/05/2018 and earlier. FINDINGS: PA and lateral views of the chest. Mild re-accumulation of the right pleural effusion is suspected since 09/05/2018. Moderate size effusion which appears at least partially loculated. Continued architectural distortion in the right lower lung. No superimposed pneumothorax or pulmonary edema. Stable cardiac size and mediastinal contours. Visualized tracheal air column is within normal limits. Stable left lung. Stable visualized osseous structures. Negative visible bowel gas pattern. IMPRESSION: 1. Moderate-sized and at least partially loculated right pleural effusion with some reaccumulation suspected since 09/05/2018. 2. Otherwise stable chest. Electronically Signed   By: Genevie Ann M.D.   On: 09/27/2018 01:31   Ir Thoracentesis Asp Pleural Space W/img Guide  Result Date: 09/27/2018 INDICATION: Shortness of breath. Large right pleural effusion. Request diagnostic and therapeutic thoracentesis. EXAM: ULTRASOUND GUIDED RIGHT THORACENTESIS MEDICATIONS: None. COMPLICATIONS: None immediate. PROCEDURE: An ultrasound guided thoracentesis was thoroughly discussed with the patient and questions answered. The benefits, risks, alternatives and complications were also discussed. The  patient understands and wishes to proceed with the procedure. Written consent was obtained. Ultrasound was performed to localize and mark an adequate pocket of fluid in the right chest. The area was then prepped and draped in the normal sterile fashion. 1% Lidocaine was used for local anesthesia. Under ultrasound guidance a 6 Fr Safe-T-Centesis catheter was introduced. Thoracentesis was performed. The catheter was removed and a dressing applied. FINDINGS: A total of approximately 1.7 L of clear yellow fluid was removed. Samples were sent to the laboratory as requested by the clinical team. IMPRESSION: Successful ultrasound guided right thoracentesis yielding 1.7 L of pleural fluid. Read by: Ascencion Dike PA-C Electronically Signed   By: Markus Daft M.D.   On: 09/27/2018 10:10   EXAM: CT CHEST WITH CONTRAST  TECHNIQUE: Multidetector CT imaging of the chest was performed during intravenous contrast administration.  CONTRAST:  107mL ISOVUE-300 IOPAMIDOL (ISOVUE-300) INJECTION 61%  COMPARISON:  08/23/2018, PET-CT, 05/23/2018  FINDINGS: Cardiovascular: No significant vascular findings. Normal heart size. No pericardial effusion.  Mediastinum/Nodes: No enlarged mediastinal, hilar, or axillary lymph nodes. Thyroid gland, trachea, and esophagus demonstrate no significant findings.  Lungs/Pleura: Redemonstrated moderate to large right pleural effusion, not significantly changed compared to prior examination. There is resolved cavitation and decreased size of a spiculated, masslike nodule of the anterior left lower lobe (series 3, image 127). No change in other masslike areas of consolidation, most conspicuous about the right hilum and in the anterior right lower lobe.  Upper Abdomen: No acute abnormality.  Musculoskeletal: No chest wall mass or suspicious bone lesions identified.  IMPRESSION: Redemonstrated moderate to large right pleural effusion, not significantly changed  compared to prior examination. There is resolved cavitation and decreased size of a spiculated, masslike nodule  of the anterior left lower lobe (series 3, image 127). No change in other masslike areas of consolidation, most conspicuous about the right hilum and in the anterior right lower lobe. The appearance of these findings in general remains highly suspicious for malignancy although no PET avidity was appreciated on PET-CT dated 05/23/2018. No new airspace opacity or other findings to explain shortness of breath are noted on current examination.   Electronically Signed   By: Eddie Candle M.D.   On: 09/05/2018 10:47   Assessment and Plan:   1. Acute CHF The patient is found to have EF of 15% of unclear etiology.  We have discussed at length today.  He reports that his only symptoms are due to recurrence pleural effusion.  He reports that after thoracentesis that he has been asymptomatic previously.  He denies ischemic symptoms. I have offered cath to further evaluate the etiology of his CHF as well as additional testing.  At this time, he is uncertain as to whether or not he would want additional CV testing.  He states "I was fine until yall started experimenting on me".  I have spent over 30 minutes trying to discuss options however he is resistant to further discussions at this time.  I would advise cath as well as medicine optimization for his CHF.  At this time, he declines.   I will have cardiology discuss with him further tomorrow.   For questions or updates, please contact [place] Please consult [email or web address] for contact info under   Thompson Grayer MD, [place] 09/29/2018 2:27 PM

## 2018-09-30 DIAGNOSIS — Z9221 Personal history of antineoplastic chemotherapy: Secondary | ICD-10-CM

## 2018-09-30 DIAGNOSIS — Z9889 Other specified postprocedural states: Secondary | ICD-10-CM

## 2018-09-30 DIAGNOSIS — Z923 Personal history of irradiation: Secondary | ICD-10-CM

## 2018-09-30 DIAGNOSIS — I5043 Acute on chronic combined systolic (congestive) and diastolic (congestive) heart failure: Secondary | ICD-10-CM

## 2018-09-30 DIAGNOSIS — I5023 Acute on chronic systolic (congestive) heart failure: Secondary | ICD-10-CM

## 2018-09-30 DIAGNOSIS — R931 Abnormal findings on diagnostic imaging of heart and coronary circulation: Secondary | ICD-10-CM

## 2018-09-30 LAB — PATHOLOGIST SMEAR REVIEW

## 2018-09-30 LAB — BASIC METABOLIC PANEL
Anion gap: 8 (ref 5–15)
BUN: 15 mg/dL (ref 8–23)
CO2: 29 mmol/L (ref 22–32)
Calcium: 8.9 mg/dL (ref 8.9–10.3)
Chloride: 104 mmol/L (ref 98–111)
Creatinine, Ser: 0.97 mg/dL (ref 0.61–1.24)
GFR calc Af Amer: >60 mL/min (ref >60)
GFR calc non Af Amer: >60 mL/min (ref >60)
Glucose, Bld: 91 mg/dL (ref 70–99)
Potassium: 3.7 mmol/L (ref 3.5–5.1)
Sodium: 141 mmol/L (ref 135–145)

## 2018-09-30 LAB — PH, BODY FLUID: pH, Body Fluid: 7.8

## 2018-09-30 MED ORDER — SODIUM CHLORIDE 0.9% FLUSH: 3.0000 mL | Freq: Two times a day (BID) | INTRAVENOUS | Status: DC

## 2018-09-30 MED ORDER — SODIUM CHLORIDE 0.9 % IV SOLN
INTRAVENOUS | Status: DC
  Administered 2018-10-01: 06:00:00 via INTRAVENOUS

## 2018-09-30 MED ORDER — CARVEDILOL 3.125 MG PO TABS
3.1250 mg | ORAL_TABLET | Freq: Two times a day (BID) | ORAL | Status: DC
  Administered 2018-09-30 – 2018-10-02 (×5): 3.125 mg via ORAL
  Filled 2018-09-30 (×5): qty 1

## 2018-09-30 MED ORDER — SODIUM CHLORIDE 0.9% FLUSH: 3.0000 mL | INTRAVENOUS | Status: DC | PRN

## 2018-09-30 MED ORDER — SODIUM CHLORIDE 0.9 % IV SOLN
250.0000 mL | INTRAVENOUS | Status: DC | PRN
  Administered 2018-10-01: 125 mL via INTRAVENOUS

## 2018-09-30 MED ORDER — ASPIRIN 81 MG PO CHEW
81.0000 mg | CHEWABLE_TABLET | ORAL | Status: AC
  Administered 2018-10-01: 81 mg via ORAL
  Filled 2018-09-30: qty 1

## 2018-09-30 NOTE — Evaluation (Addendum)
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 Pt able to perform gait, stairs and all transfers on RA with SPO2 >92% throughout and no DOE. Pt without significant strength, balance or mobility deficits without current need for therapy services. Will sign off with pt aware and agreeable.  SpO2 92-95% on RA HR 48-64    Follow Up Recommendations No PT follow up    Equipment Recommendations  None recommended by PT    Recommendations for Other Services       Precautions / Restrictions Precautions Precautions: None      Mobility  Bed Mobility Overal bed mobility: Independent                Transfers Overall transfer level: Independent                  Ambulation/Gait Ambulation/Gait assistance: Independent Gait Distance (Feet): 500 Feet Assistive device: None Gait Pattern/deviations: Step-through pattern;Wide base of support   Gait velocity interpretation: >4.37 ft/sec, indicative of normal walking speed General Gait Details: pt with steady gait with slightly altered stance at times but able to perform head turns and change of speed during gait without LOB  Stairs Stairs: Yes Stairs assistance: Independent Stair Management: Alternating pattern;Forwards;No rails Number of Stairs: 11 General stair comments: pt with great use of stairs without rail with SPO2 >92% throughout  Wheelchair Mobility    Modified Rankin (Stroke Patients Only)       Balance Overall balance assessment: Independent                                           Pertinent  Vitals/Pain Pain Assessment: No/denies pain    Home Living Family/patient expects to be discharged to:: Private residence Living Arrangements: Alone Available Help at Discharge: Family;Available PRN/intermittently Type of Home: Apartment Home Access: Stairs to enter   Entrance Stairs-Number of Steps: flight Home Layout: One level Home Equipment: None      Prior Function Level of Independence: Independent               Hand Dominance        Extremity/Trunk Assessment   Upper Extremity Assessment Upper Extremity Assessment: Overall WFL for tasks assessed    Lower Extremity Assessment Lower Extremity Assessment: Overall WFL for tasks assessed    Cervical / Trunk Assessment Cervical / Trunk Assessment: Normal  Communication   Communication: No difficulties  Cognition Arousal/Alertness: Awake/alert Behavior During Therapy: WFL for tasks assessed/performed Overall Cognitive Status: Within Functional Limits for tasks assessed                                        General Comments      Exercises     Assessment/Plan    PT Assessment Patent does not need any further PT services  PT Problem List         PT Treatment Interventions      PT  Goals (Current goals can be found in the Care Plan section)  Acute Rehab PT Goals Patient Stated Goal: return home PT Goal Formulation: All assessment and education complete, DC therapy    Frequency     Barriers to discharge        Co-evaluation               AM-PAC PT "6 Clicks" Mobility  Outcome Measure Help needed turning from your back to your side while in a flat bed without using bedrails?: None Help needed moving from lying on your back to sitting on the side of a flat bed without using bedrails?: None Help needed moving to and from a bed to a chair (including a wheelchair)?: None Help needed standing up from a chair using your arms (e.g., wheelchair or bedside chair)?: None Help  needed to walk in hospital room?: None Help needed climbing 3-5 steps with a railing? : None 6 Click Score: 24    End of Session Equipment Utilized During Treatment: Gait belt Activity Tolerance: Patient tolerated treatment well Patient left: in chair;with call bell/phone within reach Nurse Communication: Mobility status PT Visit Diagnosis: Other abnormalities of gait and mobility (R26.89)    Time: 6063-0160 PT Time Calculation (min) (ACUTE ONLY): 19 min   Charges:   PT Evaluation $PT Eval Low Complexity: [place], PT Acute Rehabilitation Services Pager: [phone] Office: [place] 09/30/2018, 1:17 PM

## 2018-09-30 NOTE — Progress Notes (Signed)
Progress Note  Patient Name: Jared Stevens Date of Encounter: 09/30/2018  Primary Cardiologist:  Allred  Subjective   68 yo with lung cancer, found to have EF 15%.  Hx of cocaine use - last used a month ago , Continues to smoke cigarettes.   He is net negative  9.2 liters so far this admission ( diuresis and thoracentesis )   Inpatient Medications    Scheduled Meds: . amitriptyline  50 mg Oral QHS  . atorvastatin  20 mg Oral q1800  . enoxaparin (LOVENOX) injection  40 mg Subcutaneous Q24H  . furosemide  20 mg Intravenous BID  . latanoprost  1 drop Both Eyes BID  . mometasone-formoterol  2 puff Inhalation BID  . nicotine  21 mg Transdermal Daily  . sodium chloride flush  3 mL Intravenous Q12H   Continuous Infusions:  PRN Meds: acetaminophen **OR** acetaminophen, albuterol, HYDROcodone-acetaminophen, lidocaine, ondansetron **OR** ondansetron (ZOFRAN) IV   Vital Signs    Vitals:   09/30/18 0624 09/30/18 0624 09/30/18 0837 09/30/18 0910  BP:  112/80  106/74  Pulse:  (!) 105 (!) 111 93  Resp:  18 18   Temp:  98.3 F (36.8 C)    TempSrc:  Oral    SpO2:  99% 98%   Weight: 60.7 kg     Height:        Intake/Output Summary (Last 24 hours) at 09/30/2018 1110 Last data filed at 09/30/2018 1006 Gross per 24 hour  Intake 1080 ml  Output 3100 ml  Net -2020 ml   Last 3 Weights 09/30/2018 09/29/2018 09/28/2018  Weight (lbs) 133 lb 12.8 oz 137 lb 12.8 oz 142 lb 14.4 oz  Weight (kg) 60.691 kg 62.506 kg 64.819 kg      Telemetry    Sinus tach,   , HR 108  - Personally Reviewed  ECG     sinus tach  - Personally Reviewed  Physical Exam   GEN: chronically ill appearing ,  Poor dentition Neck: No JVD Cardiac: RRR,  Soft systolic murmur   Respiratory: Clear to auscultation bilaterally. GI: Soft, nontender, non-distended  MS: No edema;  Very thin Neuro:  Nonfocal  Psych: Normal affect   Labs    Chemistry Recent Labs  Lab 09/27/18 0102 09/28/18 0221 09/29/18 0449  09/30/18 0531  NA 142 140 142 141  K 4.0 4.0 3.7 3.7  CL 110 107 106 104  CO2 23 27 28 29   GLUCOSE 93 87 97 91  BUN 22 19 14 15   CREATININE 0.93 0.85 0.90 0.97  CALCIUM 8.9 8.5* 8.5* 8.9  PROT 6.2*  --  5.6*  --   ALBUMIN 3.3*  --  2.7*  --   AST 62*  --  31  --   ALT 51*  --  35  --   ALKPHOS 79  --  73  --   BILITOT 0.6  --  0.6  --   GFRNONAA >60 >60 >60 >60  GFRAA >60 >60 >60 >60  ANIONGAP 9 6 8 8      Hematology Recent Labs  Lab 09/27/18 0102 09/28/18 0221  WBC 7.2 6.4  RBC 4.81 4.64  HGB 14.0 13.6  HCT 43.0 40.8  MCV 89.4 87.9  MCH 29.1 29.3  MCHC 32.6 33.3  RDW 16.5* 16.2*  PLT 320 298    Cardiac EnzymesNo results for input(s): TROPONINI in the last 168 hours. No results for input(s): TROPIPOC in the last 168 hours.   BNP Recent Labs  Lab 09/27/18 0103  BNP 1,225.5*     DDimer No results for input(s): DDIMER in the last 168 hours.   Radiology    No results found.  Cardiac Studies      Patient Profile     68 y.o. male with newly discovered systolic CHF   Assessment & Plan    1.   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  Grade 1 diastolic CHF  Also has severe RV dysfunction   He agrees to have a R and L heart cath tomorrow  Scheduled with Dr. Avie Arenas discussed the risks, benefits, options. He understands and agrees to proceed.    Will start coreg 3.125 BID.   I doubt he would tolerate Entresto currently .    2.  Lung cancer :  Discussed with the oncology NP.   No signs of recurrent lung cancer at this point .         For questions or updates, please contact [place] Please consult [email or web address] for contact info under        Signed, Mertie Moores, MD  09/30/2018, 11:10 AM

## 2018-09-30 NOTE — Progress Notes (Signed)
HEMATOLOGY-ONCOLOGY PROGRESS NOTE  SUBJECTIVE: Jared Stevens is a 68 year old male who is followed by Dr. Julien Nordmann for stage IIIa non-small cell lung cancer, adenocarcinoma.  The patient has been on observation since early 2018.  The patient has been admitted to the hospital for recurrent pleural effusion.  He initially underwent a thoracentesis in the emergency room on 09/05/2018.  Fluid cytology was negative for malignancy.  He presented to the emergency room again on 09/27/2018 with increasing shortness of breath.  He had another thoracentesis performed that day and 1.7 L of fluid was removed.  Work-up in the emergency room was significant for a BNP of 1225.  He was started on Lasix.  He was admitted for further evaluation.  The patient had an echocardiogram performed during his hospitalization which showed an ejection fraction of only 15%.  Cardiology has been consulted and they recommend performing catheterization.  The patient was initially resistant to this but is now agreeable.  When seen today, the patient reports that his breathing has improved.  He denies any chest discomfort.  Reports that he has a good appetite and has not lost any weight recently.  He offers no other complaints today.  REVIEW OF SYSTEMS:   Constitutional: Denies fevers, chills or abnormal weight loss Eyes: Denies blurriness of vision Ears, nose, mouth, throat, and face: Denies mucositis or sore throat Respiratory: Reports shortness of breath which has improved since thoracentesis on 09/27/2018. Cardiovascular: Denies palpitation, chest discomfort Gastrointestinal:  Denies nausea, heartburn or change in bowel habits Skin: Denies abnormal skin rashes Lymphatics: Denies new lymphadenopathy or easy bruising Neurological:Denies numbness, tingling or new weaknesses Behavioral/Psych: Mood is stable, no new changes  Extremities: No lower extremity edema All other systems were reviewed with the patient and are negative.  I have  reviewed the past medical history, past surgical history, social history and family history with the patient and they are unchanged from previous note.   PHYSICAL EXAMINATION: ECOG PERFORMANCE STATUS: 1 - Symptomatic but completely ambulatory  Vitals:   09/30/18 0837 09/30/18 0910  BP:  106/74  Pulse: (!) 111 93  Resp: 18   Temp:    SpO2: 98%    Filed Weights   09/28/18 0449 09/29/18 0647 09/30/18 0624  Weight: 142 lb 14.4 oz (64.8 kg) 137 lb 12.8 oz (62.5 kg) 133 lb 12.8 oz (60.7 kg)    Intake/Output from previous day: 05/03 0701 - 05/04 0700 In: 960 [P.O.:960] Out: 3350 [Urine:3350]  GENERAL:alert, no distress and comfortable SKIN: skin color, texture, turgor are normal, no rashes or significant lesions EYES: normal, Conjunctiva are pink and non-injected, sclera clear OROPHARYNX:no exudate, no erythema and lips, buccal mucosa, and tongue normal  NECK: supple, thyroid normal size, non-tender, without nodularity LYMPH:  no palpable lymphadenopathy in the cervical, axillary or inguinal LUNGS: clear to auscultation and percussion with normal breathing effort HEART: regular rate & rhythm and no murmurs and no lower extremity edema ABDOMEN:abdomen soft, non-tender and normal bowel sounds Musculoskeletal:no cyanosis of digits and no clubbing  NEURO: alert & oriented x 3 with fluent speech, no focal motor/sensory deficits  LABORATORY DATA:  I have reviewed the data as listed CMP Latest Ref Rng & Units 09/30/2018 09/29/2018 09/28/2018  Glucose 70 - 99 mg/dL 91 97 87  BUN 8 - 23 mg/dL 15 14 19   Creatinine 0.61 - 1.24 mg/dL 0.97 0.90 0.85  Sodium 135 - 145 mmol/L 141 142 140  Potassium 3.5 - 5.1 mmol/L 3.7 3.7 4.0  Chloride 98 - 111  mmol/L 104 106 107  CO2 22 - 32 mmol/L 29 28 27   Calcium 8.9 - 10.3 mg/dL 8.9 8.5(L) 8.5(L)  Total Protein 6.5 - 8.1 g/dL - 5.6(L) -  Total Bilirubin 0.3 - 1.2 mg/dL - 0.6 -  Alkaline Phos 38 - 126 U/L - 73 -  AST 15 - 41 U/L - 31 -  ALT 0 - 44 U/L -  35 -    Lab Results  Component Value Date   WBC 6.4 09/28/2018   HGB 13.6 09/28/2018   HCT 40.8 09/28/2018   MCV 87.9 09/28/2018   PLT 298 09/28/2018   NEUTROABS 4.9 09/27/2018    Dg Chest 1 View  Result Date: 09/27/2018 CLINICAL DATA:  Status post right thoracentesis. EXAM: CHEST  1 VIEW COMPARISON:  Radiographs of same day. FINDINGS: Stable cardiomegaly. Left lung is clear. No pneumothorax is noted. Stable moderate size loculated right pleural effusion is noted. Bony thorax is unremarkable. IMPRESSION: No pneumothorax is noted status post thoracentesis. Grossly stable moderate size loculated right pleural effusion is noted. Electronically Signed   By: Marijo Conception M.D.   On: 09/27/2018 10:18   Dg Chest 2 View  Result Date: 09/27/2018 CLINICAL DATA:  68 year old male with shortness of breath, lung cancer. EXAM: CHEST - 2 VIEW COMPARISON:  09/05/2018 and earlier. FINDINGS: PA and lateral views of the chest. Mild re-accumulation of the right pleural effusion is suspected since 09/05/2018. Moderate size effusion which appears at least partially loculated. Continued architectural distortion in the right lower lung. No superimposed pneumothorax or pulmonary edema. Stable cardiac size and mediastinal contours. Visualized tracheal air column is within normal limits. Stable left lung. Stable visualized osseous structures. Negative visible bowel gas pattern. IMPRESSION: 1. Moderate-sized and at least partially loculated right pleural effusion with some reaccumulation suspected since 09/05/2018. 2. Otherwise stable chest. Electronically Signed   By: Genevie Ann M.D.   On: 09/27/2018 01:31   Ct Chest W Contrast  Result Date: 09/05/2018 CLINICAL DATA:  Shortness of breath EXAM: CT CHEST WITH CONTRAST TECHNIQUE: Multidetector CT imaging of the chest was performed during intravenous contrast administration. CONTRAST:  19mL ISOVUE-300 IOPAMIDOL (ISOVUE-300) INJECTION 61% COMPARISON:  08/23/2018, PET-CT,  05/23/2018 FINDINGS: Cardiovascular: No significant vascular findings. Normal heart size. No pericardial effusion. Mediastinum/Nodes: No enlarged mediastinal, hilar, or axillary lymph nodes. Thyroid gland, trachea, and esophagus demonstrate no significant findings. Lungs/Pleura: Redemonstrated moderate to large right pleural effusion, not significantly changed compared to prior examination. There is resolved cavitation and decreased size of a spiculated, masslike nodule of the anterior left lower lobe (series 3, image 127). No change in other masslike areas of consolidation, most conspicuous about the right hilum and in the anterior right lower lobe. Upper Abdomen: No acute abnormality. Musculoskeletal: No chest wall mass or suspicious bone lesions identified. IMPRESSION: Redemonstrated moderate to large right pleural effusion, not significantly changed compared to prior examination. There is resolved cavitation and decreased size of a spiculated, masslike nodule of the anterior left lower lobe (series 3, image 127). No change in other masslike areas of consolidation, most conspicuous about the right hilum and in the anterior right lower lobe. The appearance of these findings in general remains highly suspicious for malignancy although no PET avidity was appreciated on PET-CT dated 05/23/2018. No new airspace opacity or other findings to explain shortness of breath are noted on current examination. Electronically Signed   By: Eddie Candle M.D.   On: 09/05/2018 10:47   Dg Chest [facility]  Result Date: 09/05/2018 CLINICAL DATA:  Status post right thoracentesis. EXAM: PORTABLE CHEST 1 VIEW COMPARISON:  CT chest and single view of the chest earlier today. FINDINGS: Right pleural effusion is decreased after thoracentesis. No pneumothorax. Streaky atelectasis left lung base noted. Small cavitary lesion in the anterior left lower lobe is not visible on this study. Cardiac silhouette is partially obscured. No acute or  focal bony abnormality. IMPRESSION: Decreased right effusion after thoracentesis. No new abnormality negative for pneumothorax. Electronically Signed   By: Inge Rise M.D.   On: 09/05/2018 15:47   Dg Chest Port 1 View  Result Date: 09/05/2018 CLINICAL DATA:  Cancer/pneumonia EXAM: PORTABLE CHEST 1 VIEW COMPARISON:  06/23/2018 radiograph.  Chest CT 08/23/2018 FINDINGS: Moderate to large right pleural effusion with opacified right base which is both collapsed/opacified-see prior CT. The left chest is radiographically clear. Mild cardiomegaly. IMPRESSION: Large right pleural effusion obscuring pulmonary collapse at the right base, see chest CT 08/23/2018 Electronically Signed   By: Monte Fantasia M.D.   On: 09/05/2018 08:49   Ir Thoracentesis Asp Pleural Space W/img Guide  Result Date: 09/27/2018 INDICATION: Shortness of breath. Large right pleural effusion. Request diagnostic and therapeutic thoracentesis. EXAM: ULTRASOUND GUIDED RIGHT THORACENTESIS MEDICATIONS: None. COMPLICATIONS: None immediate. PROCEDURE: An ultrasound guided thoracentesis was thoroughly discussed with the patient and questions answered. The benefits, risks, alternatives and complications were also discussed. The patient understands and wishes to proceed with the procedure. Written consent was obtained. Ultrasound was performed to localize and mark an adequate pocket of fluid in the right chest. The area was then prepped and draped in the normal sterile fashion. 1% Lidocaine was used for local anesthesia. Under ultrasound guidance a 6 Fr Safe-T-Centesis catheter was introduced. Thoracentesis was performed. The catheter was removed and a dressing applied. FINDINGS: A total of approximately 1.7 L of clear yellow fluid was removed. Samples were sent to the laboratory as requested by the clinical team. IMPRESSION: Successful ultrasound guided right thoracentesis yielding 1.7 L of pleural fluid. Read by: Ascencion Dike PA-C Electronically  Signed   By: Markus Daft M.D.   On: 09/27/2018 10:10    ASSESSMENT AND PLAN: This is a very pleasant 68 year old African-American male with history of a stage IIIa non-small cell lung cancer, adenocarcinoma status post a course of concurrent chemoradiation in [place] in 2017. The patient is currently on observation.  He has developed increasing shortness of breath and has undergone thoracentesis x2 over the past month.  Cytology from 09/05/2018 was reviewed and did not show any evidence of malignant cells.  Cytology from 09/27/2018 is currently pending.  Currently, the patient does not have any evidence of definitive cancer recurrence.  He has left ventricular ejection fraction of 15%.  Cardiology has seen the patient and recommends heart cath tomorrow. The patient was initially resistant to this but is now agreeable.  We will follow-up on cytology from pleural fluid from 09/27/2018.  If negative, he will keep his previously scheduled follow-up with Dr. Julien Nordmann in early July 2020.  If the cytology shows evidence of malignancy, we will get him back for follow-up sooner.   LOS: 3 days   Mikey Bussing, DNP, AGPCNP-BC, AOCNP 09/30/18

## 2018-09-30 NOTE — Progress Notes (Signed)
PROGRESS NOTE  Jared Stevens ESP:[id number] DOB: 02-28-1951 DOA: 09/27/2018 PCP: Patient, No Pcp Per  HPI/Recap of past 24 hours:  3.3liters urine output last 24hrs, cr 0.9, bp low normal  Denies chest pain, no fever, no room air at rest, no edema, no cough  He is agreable to cardiac cath   Assessment/Plan: Principal Problem:   CHF exacerbation (HCC) Active Problems:   History of lung cancer   Acute on chronic respiratory failure with hypoxia (HCC)   Elevated brain natriuretic peptide (BNP) level   COPD (chronic obstructive pulmonary disease) (HCC)   Hyperlipidemia   Tobacco abuse   Recurrent right pleural effusion   Acute systolic CHF (congestive heart failure) (HCC)  New diagnosis of heart failure/acute hypoxic respiratory failure/recurrent right sided pleural effusion -Presented with sob, hypoxia and bilateral lower extremity edema ( he reports started to have edema a month ago) -Denies h/o chest pain -Echo lvef 15%, patient reports no prior cardiac history,  -currently on lasix, bp low normal , now able to tolerate betablocker -cardiology consulted  Elevated lft Likely from liver congestion, resolved.  -H/o stage III lung cancer ( right) reports finished treatment with concurrent chemo/XRT in 2018 Follows with Dr Anibal Henderson Pleural fluids cytology negative on 4/9  COPD; stable, no wheezing  Reports intermittent cocaine use, last use a month ago Reports rarely drink alcohol Reports continue smoke but cutting down  Code Status: full ,confirmed with the patient  Family Communication: patient   Disposition Plan: not ready to discharge, needs cardiology clearance   Consultants:  IR  Cardiology  oncology  Procedures: US guided right thoracentesis.  Yielded 1.7 L of clear yellow fluid.  Antibiotics:  none   Objective: BP 108/80 (BP Location: Left Arm)   Pulse (!) 56   Temp 99.9 F (37.7 C) (Oral)   Resp 18   Ht 6\' 1"  (1.854 m)   Wt 60.7 kg    SpO2 98%   BMI 17.65 kg/m   Intake/Output Summary (Last 24 hours) at 09/30/2018 1259 Last data filed at 09/30/2018 1159 Gross per 24 hour  Intake 1268 ml  Output 2950 ml  Net -1682 ml   Filed Weights   09/28/18 0449 09/29/18 0647 09/30/18 0624  Weight: 64.8 kg 62.5 kg 60.7 kg    Exam: Patient is examined daily including today on 09/30/2018, exams remain the same as of yesterday except that has changed    General:  Thin, poor dentition, NAD  Cardiovascular: RRR  Respiratory: diminished on the right, clear on the left  Abdomen: Soft/ND/NT, positive BS  Musculoskeletal: No Edema  Neuro: alert, oriented , poor insight   Data Reviewed: Basic Metabolic Panel: Recent Labs  Lab 09/27/18 0102 09/28/18 0221 09/29/18 0449 09/30/18 0531  NA 142 140 142 141  K 4.0 4.0 3.7 3.7  CL 110 107 106 104  CO2 23 27 28 29   GLUCOSE 93 87 97 91  BUN 22 19 14 15   CREATININE 0.93 0.85 0.90 0.97  CALCIUM 8.9 8.5* 8.5* 8.9  MG  --   --  1.9  --    Liver Function Tests: Recent Labs  Lab 09/27/18 0102 09/29/18 0449  AST 62* 31  ALT 51* 35  ALKPHOS 79 73  BILITOT 0.6 0.6  PROT 6.2* 5.6*  ALBUMIN 3.3* 2.7*   No results for input(s): LIPASE, AMYLASE in the last 168 hours. No results for input(s): AMMONIA in the last 168 hours. CBC: Recent Labs  Lab 09/27/18 0102 09/28/18 0221  WBC 7.2 6.4  NEUTROABS 4.9  --   HGB 14.0 13.6  HCT 43.0 40.8  MCV 89.4 87.9  PLT 320 298   Cardiac Enzymes:   No results for input(s): CKTOTAL, CKMB, CKMBINDEX, TROPONINI in the last 168 hours. BNP (last 3 results) Recent Labs    09/05/18 0804 09/27/18 0103  BNP 1,010.6* 1,225.5*    ProBNP (last 3 results) No results for input(s): PROBNP in the last 8760 hours.  CBG: No results for input(s): GLUCAP in the last 168 hours.  Recent Results (from the past 240 hour(s))  Gram stain     Status: None   Collection Time: 09/27/18  9:49 AM  Result Value Ref Range Status   Specimen Description  PLEURAL RIGHT  Final   Special Requests NONE  Final   Gram Stain   Final    RARE WBC PRESENT, PREDOMINANTLY PMN NO ORGANISMS SEEN Performed at [facility], [address], [place], [place] [address]    Report Status 09/27/2018 FINAL  Final  Culture, body fluid-bottle     Status: None (Preliminary result)   Collection Time: 09/27/18  9:49 AM  Result Value Ref Range Status   Specimen Description PLEURAL RIGHT  Final   Special Requests NONE  Final   Culture   Final    NO GROWTH 3 DAYS Performed at [facility] [address] [place],  [address]    Report Status PENDING  Incomplete     Studies: No results found.  Scheduled Meds: . amitriptyline  50 mg Oral QHS  . atorvastatin  20 mg Oral q1800  . carvedilol  3.125 mg Oral BID WC  . enoxaparin (LOVENOX) injection  40 mg Subcutaneous Q24H  . furosemide  20 mg Intravenous BID  . latanoprost  1 drop Both Eyes BID  . mometasone-formoterol  2 puff Inhalation BID  . nicotine  21 mg Transdermal Daily  . sodium chloride flush  3 mL Intravenous Q12H    Continuous Infusions:   Time spent: 72mins I have personally reviewed and interpreted on  09/30/2018 daily labs, tele strips, imagings as discussed above under date review session and assessment and plans.  I reviewed all nursing notes, pharmacy notes, consultant notes,  vitals, pertinent old records  I have discussed plan of care as described above with RN , patient  on 09/30/2018   Florencia Reasons MD, PhD  [facility] Pager [phone]. If 7PM-7AM, please contact night-coverage at [email or web address], password [email or web address] 09/30/2018, 12:59 PM  LOS: 3 days

## 2018-09-30 NOTE — H&P (View-Only) (Signed)
Progress Note  Patient Name: Jared Stevens Date of Encounter: 09/30/2018  Primary Cardiologist:  Allred  Subjective   68 yo with lung cancer, found to have EF 15%.  Hx of cocaine use - last used a month ago , Continues to smoke cigarettes.   He is net negative  9.2 liters so far this admission ( diuresis and thoracentesis )   Inpatient Medications    Scheduled Meds: . amitriptyline  50 mg Oral QHS  . atorvastatin  20 mg Oral q1800  . enoxaparin (LOVENOX) injection  40 mg Subcutaneous Q24H  . furosemide  20 mg Intravenous BID  . latanoprost  1 drop Both Eyes BID  . mometasone-formoterol  2 puff Inhalation BID  . nicotine  21 mg Transdermal Daily  . sodium chloride flush  3 mL Intravenous Q12H   Continuous Infusions:  PRN Meds: acetaminophen **OR** acetaminophen, albuterol, HYDROcodone-acetaminophen, lidocaine, ondansetron **OR** ondansetron (ZOFRAN) IV   Vital Signs    Vitals:   09/30/18 0624 09/30/18 0624 09/30/18 0837 09/30/18 0910  BP:  112/80  106/74  Pulse:  (!) 105 (!) 111 93  Resp:  18 18   Temp:  98.3 F (36.8 C)    TempSrc:  Oral    SpO2:  99% 98%   Weight: 60.7 kg     Height:        Intake/Output Summary (Last 24 hours) at 09/30/2018 1110 Last data filed at 09/30/2018 1006 Gross per 24 hour  Intake 1080 ml  Output 3100 ml  Net -2020 ml   Last 3 Weights 09/30/2018 09/29/2018 09/28/2018  Weight (lbs) 133 lb 12.8 oz 137 lb 12.8 oz 142 lb 14.4 oz  Weight (kg) 60.691 kg 62.506 kg 64.819 kg      Telemetry    Sinus tach,   , HR 108  - Personally Reviewed  ECG     sinus tach  - Personally Reviewed  Physical Exam   GEN: chronically ill appearing ,  Poor dentition Neck: No JVD Cardiac: RRR,  Soft systolic murmur   Respiratory: Clear to auscultation bilaterally. GI: Soft, nontender, non-distended  MS: No edema;  Very thin Neuro:  Nonfocal  Psych: Normal affect   Labs    Chemistry Recent Labs  Lab 09/27/18 0102 09/28/18 0221 09/29/18 0449  09/30/18 0531  NA 142 140 142 141  K 4.0 4.0 3.7 3.7  CL 110 107 106 104  CO2 23 27 28 29   GLUCOSE 93 87 97 91  BUN 22 19 14 15   CREATININE 0.93 0.85 0.90 0.97  CALCIUM 8.9 8.5* 8.5* 8.9  PROT 6.2*  --  5.6*  --   ALBUMIN 3.3*  --  2.7*  --   AST 62*  --  31  --   ALT 51*  --  35  --   ALKPHOS 79  --  73  --   BILITOT 0.6  --  0.6  --   GFRNONAA >60 >60 >60 >60  GFRAA >60 >60 >60 >60  ANIONGAP 9 6 8 8      Hematology Recent Labs  Lab 09/27/18 0102 09/28/18 0221  WBC 7.2 6.4  RBC 4.81 4.64  HGB 14.0 13.6  HCT 43.0 40.8  MCV 89.4 87.9  MCH 29.1 29.3  MCHC 32.6 33.3  RDW 16.5* 16.2*  PLT 320 298    Cardiac EnzymesNo results for input(s): TROPONINI in the last 168 hours. No results for input(s): TROPIPOC in the last 168 hours.   BNP Recent Labs  Lab 09/27/18 0103  BNP 1,225.5*     DDimer No results for input(s): DDIMER in the last 168 hours.   Radiology    No results found.  Cardiac Studies      Patient Profile     68 y.o. male with newly discovered systolic CHF   Assessment & Plan    1.   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  Grade 1 diastolic CHF  Also has severe RV dysfunction   He agrees to have a R and L heart cath tomorrow  Scheduled with Dr. Avie Arenas discussed the risks, benefits, options. He understands and agrees to proceed.    Will start coreg 3.125 BID.   I doubt he would tolerate Entresto currently .    2.  Lung cancer :  Discussed with the oncology NP.   No signs of recurrent lung cancer at this point .         For questions or updates, please contact [place] Please consult [email or web address] for contact info under        Signed, Mertie Moores, MD  09/30/2018, 11:10 AM

## 2018-10-01 ENCOUNTER — Encounter (HOSPITAL_COMMUNITY): Payer: Self-pay | Admitting: Cardiovascular Disease

## 2018-10-01 ENCOUNTER — Encounter (HOSPITAL_COMMUNITY)
Admission: EM | Disposition: A | Discharge status: Home / Self Care | Payer: Self-pay | Source: Home / Self Care | Attending: Internal Medicine

## 2018-10-01 DIAGNOSIS — I420 Dilated cardiomyopathy: Secondary | ICD-10-CM

## 2018-10-01 DIAGNOSIS — I2510 Atherosclerotic heart disease of native coronary artery without angina pectoris: Secondary | ICD-10-CM

## 2018-10-01 HISTORY — PX: RIGHT/LEFT HEART CATH AND CORONARY ANGIOGRAPHY: CATH118266

## 2018-10-01 LAB — POCT I-STAT EG7
Acid-Base Excess: 5 mmol/L — ABNORMAL HIGH (ref 0.0–2.0)
Bicarbonate: 32.1 mmol/L — ABNORMAL HIGH (ref 20.0–28.0)
Calcium, Ion: 1.24 mmol/L (ref 1.15–1.40)
HCT: 48 % (ref 39.0–52.0)
Hemoglobin: 16.3 g/dL (ref 13.0–17.0)
O2 Saturation: 70 %
Potassium: 4.3 mmol/L (ref 3.5–5.1)
Sodium: 141 mmol/L (ref 135–145)
TCO2: 34 mmol/L — ABNORMAL HIGH (ref 22–32)
pCO2, Ven: 54.9 mmHg (ref 44.0–60.0)
pH, Ven: 7.375 (ref 7.250–7.430)
pO2, Ven: 39 mmHg (ref 32.0–45.0)

## 2018-10-01 LAB — POCT I-STAT 7, (LYTES, BLD GAS, ICA,H+H)
Acid-Base Excess: 5 mmol/L — ABNORMAL HIGH (ref 0.0–2.0)
Acid-base deficit: 1 mmol/L (ref 0.0–2.0)
Bicarbonate: 31.8 mmol/L — ABNORMAL HIGH (ref 20.0–28.0)
Bicarbonate: 26.7 mmol/L (ref 20.0–28.0)
Calcium, Ion: 1.24 mmol/L (ref 1.15–1.40)
Calcium, Ion: 1.16 mmol/L (ref 1.15–1.40)
HCT: 48 % (ref 39.0–52.0)
HCT: 45 % (ref 39.0–52.0)
Hemoglobin: 16.3 g/dL (ref 13.0–17.0)
Hemoglobin: 15.3 g/dL (ref 13.0–17.0)
O2 Saturation: 72 %
O2 Saturation: 99 %
Potassium: 4.3 mmol/L (ref 3.5–5.1)
Potassium: 3.9 mmol/L (ref 3.5–5.1)
Sodium: 141 mmol/L (ref 135–145)
Sodium: 127 mmol/L — ABNORMAL LOW (ref 135–145)
TCO2: 33 mmol/L — ABNORMAL HIGH (ref 22–32)
TCO2: 28 mmol/L (ref 22–32)
pCO2 arterial: 54.9 mmHg — ABNORMAL HIGH (ref 32.0–48.0)
pCO2 arterial: 57.3 mmHg — ABNORMAL HIGH (ref 32.0–48.0)
pH, Arterial: 7.371 (ref 7.350–7.450)
pH, Arterial: 7.277 — ABNORMAL LOW (ref 7.350–7.450)
pO2, Arterial: 40 mmHg — CL (ref 83.0–108.0)
pO2, Arterial: 155 mmHg — ABNORMAL HIGH (ref 83.0–108.0)

## 2018-10-01 LAB — BASIC METABOLIC PANEL
Anion gap: 10 (ref 5–15)
BUN: 14 mg/dL (ref 8–23)
CO2: 29 mmol/L (ref 22–32)
Calcium: 8.9 mg/dL (ref 8.9–10.3)
Chloride: 101 mmol/L (ref 98–111)
Creatinine, Ser: 0.93 mg/dL (ref 0.61–1.24)
GFR calc Af Amer: >60 mL/min (ref >60)
GFR calc non Af Amer: >60 mL/min (ref >60)
Glucose, Bld: 98 mg/dL (ref 70–99)
Potassium: 3.9 mmol/L (ref 3.5–5.1)
Sodium: 140 mmol/L (ref 135–145)

## 2018-10-01 LAB — CBC
HCT: 49 % (ref 39.0–52.0)
Hemoglobin: 16 g/dL (ref 13.0–17.0)
MCH: 29 pg (ref 26.0–34.0)
MCHC: 32.7 g/dL (ref 30.0–36.0)
MCV: 88.9 fL (ref 80.0–100.0)
Platelets: 350 10*3/uL (ref 150–400)
RBC: 5.51 MIL/uL (ref 4.22–5.81)
RDW: 16 % — ABNORMAL HIGH (ref 11.5–15.5)
WBC: 6.5 10*3/uL (ref 4.0–10.5)
nRBC: 0 % (ref 0.0–0.2)

## 2018-10-01 LAB — MAGNESIUM: Magnesium: 2 mg/dL (ref 1.7–2.4)

## 2018-10-01 LAB — CREATININE, SERUM
Creatinine, Ser: 0.97 mg/dL (ref 0.61–1.24)
GFR calc Af Amer: >60 mL/min (ref >60)
GFR calc non Af Amer: >60 mL/min (ref >60)

## 2018-10-01 SURGERY — RIGHT/LEFT HEART CATH AND CORONARY ANGIOGRAPHY
Anesthesia: LOCAL

## 2018-10-01 MED ORDER — LIDOCAINE HCL (PF) 1 % IJ SOLN
INTRAMUSCULAR | Status: DC | PRN
  Administered 2018-10-01 (×2): 2 mL
  Administered 2018-10-01: 15 mL

## 2018-10-01 MED ORDER — ONDANSETRON HCL 4 MG/2ML IJ SOLN: 4.0000 mg | Freq: Four times a day (QID) | INTRAMUSCULAR | Status: DC | PRN

## 2018-10-01 MED ORDER — FENTANYL CITRATE (PF) 100 MCG/2ML IJ SOLN
INTRAMUSCULAR | Status: AC
  Filled 2018-10-01: qty 2

## 2018-10-01 MED ORDER — HEPARIN SODIUM (PORCINE) 1000 UNIT/ML IJ SOLN
INTRAMUSCULAR | Status: AC
  Filled 2018-10-01: qty 1

## 2018-10-01 MED ORDER — MIDAZOLAM HCL 2 MG/2ML IJ SOLN
INTRAMUSCULAR | Status: DC | PRN
  Administered 2018-10-01: 1 mg via INTRAVENOUS

## 2018-10-01 MED ORDER — ACETAMINOPHEN 325 MG PO TABS: 650.0000 mg | ORAL_TABLET | ORAL | Status: DC | PRN

## 2018-10-01 MED ORDER — VERAPAMIL HCL 2.5 MG/ML IV SOLN
INTRAVENOUS | Status: AC
  Filled 2018-10-01: qty 2

## 2018-10-01 MED ORDER — HYDRALAZINE HCL 20 MG/ML IJ SOLN: 10.0000 mg | INTRAMUSCULAR | Status: AC | PRN

## 2018-10-01 MED ORDER — SODIUM CHLORIDE 0.9% FLUSH: 3.0000 mL | Freq: Two times a day (BID) | INTRAVENOUS | Status: DC

## 2018-10-01 MED ORDER — HEPARIN (PORCINE) IN NACL 1000-0.9 UT/500ML-% IV SOLN
INTRAVENOUS | Status: DC | PRN
  Administered 2018-10-01: 500 mL

## 2018-10-01 MED ORDER — SODIUM CHLORIDE 0.9 % IV SOLN: 250.0000 mL | INTRAVENOUS | Status: DC | PRN

## 2018-10-01 MED ORDER — IOHEXOL 350 MG/ML SOLN
INTRAVENOUS | Status: DC | PRN
  Administered 2018-10-01: 70 mL via INTRA_ARTERIAL

## 2018-10-01 MED ORDER — SODIUM CHLORIDE 0.9% FLUSH: 3.0000 mL | INTRAVENOUS | Status: DC | PRN

## 2018-10-01 MED ORDER — LIDOCAINE HCL (PF) 1 % IJ SOLN
INTRAMUSCULAR | Status: AC
  Filled 2018-10-01: qty 30

## 2018-10-01 MED ORDER — LABETALOL HCL 5 MG/ML IV SOLN: 10.0000 mg | INTRAVENOUS | Status: AC | PRN

## 2018-10-01 MED ORDER — SODIUM CHLORIDE 0.9 % IV SOLN: INTRAVENOUS | Status: AC

## 2018-10-01 MED ORDER — FENTANYL CITRATE (PF) 100 MCG/2ML IJ SOLN
INTRAMUSCULAR | Status: DC | PRN
  Administered 2018-10-01: 25 ug via INTRAVENOUS

## 2018-10-01 MED ORDER — ASPIRIN 81 MG PO CHEW
81.0000 mg | CHEWABLE_TABLET | Freq: Every day | ORAL | Status: DC
  Administered 2018-10-02: 81 mg via ORAL
  Filled 2018-10-01: qty 1

## 2018-10-01 MED ORDER — MIDAZOLAM HCL 2 MG/2ML IJ SOLN
INTRAMUSCULAR | Status: AC
  Filled 2018-10-01: qty 2

## 2018-10-01 MED ORDER — DIAZEPAM 5 MG PO TABS: 5.0000 mg | ORAL_TABLET | ORAL | Status: DC | PRN

## 2018-10-01 SURGICAL SUPPLY — 16 items
CATH BALLN WEDGE 5F 110CM (CATHETERS) ×2 IMPLANT
CATH INFINITI 5FR JL5 (CATHETERS) ×2 IMPLANT
CATH INFINITI 5FR MULTPACK ANG (CATHETERS) ×2 IMPLANT
COVER DOME SNAP 22 D (MISCELLANEOUS) ×2 IMPLANT
GLIDESHEATH SLEND SS 6F .021 (SHEATH) ×2 IMPLANT
GUIDEWIRE INQWIRE 1.5J.035X260 (WIRE) ×1 IMPLANT
INQWIRE 1.5J .035X260CM (WIRE) ×2
KIT HEART LEFT (KITS) ×2 IMPLANT
PACK CARDIAC CATHETERIZATION (CUSTOM PROCEDURE TRAY) ×2 IMPLANT
SHEATH GLIDE SLENDER 4/5FR (SHEATH) ×2 IMPLANT
SHEATH PINNACLE 5F 10CM (SHEATH) ×2 IMPLANT
SHEATH PROBE COVER 6X72 (BAG) ×2 IMPLANT
TRANSDUCER W/STOPCOCK (MISCELLANEOUS) ×2 IMPLANT
TUBING CIL FLEX 10 FLL-RA (TUBING) ×2 IMPLANT
WIRE EMERALD 3MM-J .025X260CM (WIRE) ×2 IMPLANT
WIRE EMERALD 3MM-J .035X150CM (WIRE) ×2 IMPLANT

## 2018-10-01 NOTE — Progress Notes (Signed)
    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

## 2018-10-01 NOTE — Progress Notes (Signed)
PROGRESS NOTE  Eldred Sooy FUX:[id number] DOB: [birth year] DOA: 09/27/2018 PCP: Patient, No Pcp Per  HPI/Recap of past 24 hours:  3.5liters urine output last 24hrs, cr 0.9, bp low normal  Denies chest pain, no fever, no room air at rest, no edema, no cough  He returned from cardiac cath   Assessment/Plan: Principal Problem:   CHF exacerbation (HCC) Active Problems:   History of lung cancer   Acute on chronic respiratory failure with hypoxia (HCC)   Elevated brain natriuretic peptide (BNP) level   COPD (chronic obstructive pulmonary disease) (HCC)   Hyperlipidemia   Tobacco abuse   Recurrent right pleural effusion   Acute systolic CHF (congestive heart failure) (HCC)  New diagnosis of heart failure/acute hypoxic respiratory failure/recurrent right sided pleural effusion -Presented with sob, hypoxia and bilateral lower extremity edema ( he reports started to have edema a month ago) -Denies h/o chest pain -Echo lvef 15%, patient reports no prior cardiac history,  -currently on lasix, bp low normal , now able to tolerate betablocker -cardiology consulted  Elevated lft Likely from liver congestion, resolved.  -H/o stage III lung cancer ( right) reports finished treatment with concurrent chemo/XRT in 2018 Follows with Dr Anibal Henderson Pleural fluids cytology negative on 4/9  COPD; stable, no wheezing  Reports intermittent cocaine use, last use a month ago Reports rarely drink alcohol Reports continue smoke but cutting down  Code Status: full ,confirmed with the patient  Family Communication: patient   Disposition Plan: not ready to discharge, needs cardiology clearance   Consultants:  IR  Cardiology  oncology  Procedures: US guided right thoracentesis.  Yielded 1.7 L of clear yellow fluid.  Antibiotics:  none   Objective: BP 95/74   Pulse (!) 107   Temp 97.7 F (36.5 C) (Oral)   Resp 16   Ht 6\' 1"  (1.854 m)   Wt 60.7 kg Comment: scale c  SpO2 98%    BMI 17.67 kg/m   Intake/Output Summary (Last 24 hours) at 10/01/2018 1632 Last data filed at 10/01/2018 1255 Gross per 24 hour  Intake 243 ml  Output 2900 ml  Net -2657 ml   Filed Weights   09/29/18 0647 09/30/18 0624 10/01/18 0525  Weight: 62.5 kg 60.7 kg 60.7 kg    Exam: Patient is examined daily including today on 10/01/2018, exams remain the same as of yesterday except that has changed    General:  Thin, poor dentition, NAD  Cardiovascular: RRR  Respiratory: diminished on the right, clear on the left  Abdomen: Soft/ND/NT, positive BS  Musculoskeletal: No Edema  Neuro: alert, oriented ,  Data Reviewed: Basic Metabolic Panel: Recent Labs  Lab 09/27/18 0102 09/28/18 0221 09/29/18 0449 09/30/18 0531 10/01/18 0400 10/01/18 0821 10/01/18 0822 10/01/18 0902 10/01/18 1048  NA 142 140 142 141 140 141 141 127*  --   K 4.0 4.0 3.7 3.7 3.9 4.3 4.3 3.9  --   CL 110 107 106 104 101  --   --   --   --   CO2 23 27 28 29 29   --   --   --   --   GLUCOSE 93 87 97 91 98  --   --   --   --   BUN 22 19 14 15 14   --   --   --   --   CREATININE 0.93 0.85 0.90 0.97 0.93  --   --   --  0.97  CALCIUM 8.9 8.5* 8.5* 8.9 8.9  --   --   --   --  MG  --   --  1.9  --  2.0  --   --   --   --    Liver Function Tests: Recent Labs  Lab 09/27/18 0102 09/29/18 0449  AST 62* 31  ALT 51* 35  ALKPHOS 79 73  BILITOT 0.6 0.6  PROT 6.2* 5.6*  ALBUMIN 3.3* 2.7*   No results for input(s): LIPASE, AMYLASE in the last 168 hours. No results for input(s): AMMONIA in the last 168 hours. CBC: Recent Labs  Lab 09/27/18 0102 09/28/18 0221 10/01/18 0821 10/01/18 0822 10/01/18 0902 10/01/18 1048  WBC 7.2 6.4  --   --   --  6.5  NEUTROABS 4.9  --   --   --   --   --   HGB 14.0 13.6 16.3 16.3 15.3 16.0  HCT 43.0 40.8 48.0 48.0 45.0 49.0  MCV 89.4 87.9  --   --   --  88.9  PLT 320 298  --   --   --  350   Cardiac Enzymes:   No results for input(s): CKTOTAL, CKMB, CKMBINDEX, TROPONINI in the  last 168 hours. BNP (last 3 results) Recent Labs    09/05/18 0804 09/27/18 0103  BNP 1,010.6* 1,225.5*    ProBNP (last 3 results) No results for input(s): PROBNP in the last 8760 hours.  CBG: No results for input(s): GLUCAP in the last 168 hours.  Recent Results (from the past 240 hour(s))  Gram stain     Status: None   Collection Time: 09/27/18  9:49 AM  Result Value Ref Range Status   Specimen Description PLEURAL RIGHT  Final   Special Requests NONE  Final   Gram Stain   Final    RARE WBC PRESENT, PREDOMINANTLY PMN NO ORGANISMS SEEN Performed at [facility], [address] [place], [place] [address]    Report Status 09/27/2018 FINAL  Final  Culture, body fluid-bottle     Status: None (Preliminary result)   Collection Time: 09/27/18  9:49 AM  Result Value Ref Range Status   Specimen Description PLEURAL RIGHT  Final   Special Requests NONE  Final   Culture   Final    NO GROWTH 4 DAYS Performed at [facility] [address], [place], [place] [address]    Report Status PENDING  Incomplete     Studies: No results found.  Scheduled Meds: . amitriptyline  50 mg Oral QHS  . [START ON 10/02/2018] aspirin  81 mg Oral Daily  . atorvastatin  20 mg Oral q1800  . carvedilol  3.125 mg Oral BID WC  . enoxaparin (LOVENOX) injection  40 mg Subcutaneous Q24H  . latanoprost  1 drop Both Eyes BID  . mometasone-formoterol  2 puff Inhalation BID  . nicotine  21 mg Transdermal Daily  . sodium chloride flush  3 mL Intravenous Q12H  . sodium chloride flush  3 mL Intravenous Q12H    Continuous Infusions: . sodium chloride       Time spent: 32mins I have personally reviewed and interpreted on  10/01/2018 daily labs, tele strips, imagings as discussed above under date review session and assessment and plans.  I reviewed all nursing notes, pharmacy notes, consultant notes,  vitals, pertinent old records  I have discussed plan of care as described above with RN ,  patient  on 10/01/2018   Florencia Reasons MD, PhD  [facility] Pager [phone]. If 7PM-7AM, please contact night-coverage at [email or web address], password [email or web address]  10/01/2018, 4:32 PM  LOS: 4 days

## 2018-10-01 NOTE — Interval H&P Note (Signed)
Cath Lab Visit (complete for each Cath Lab visit)  Clinical Evaluation Leading to the Procedure:   ACS: No.  Non-ACS:    Anginal Classification: CCS II  Anti-ischemic medical therapy: No Therapy  Non-Invasive Test Results: No non-invasive testing performed  Prior CABG: No previous CABG      History and Physical Interval Note:  10/01/2018 7:53 AM  Jared Stevens  has presented today for surgery, with the diagnosis of heart failure.  The various methods of treatment have been discussed with the patient and family. After consideration of risks, benefits and other options for treatment, the patient has consented to  Procedure(s): RIGHT/LEFT HEART CATH AND CORONARY ANGIOGRAPHY (N/A) as a surgical intervention.  The patient's history has been reviewed, patient examined, no change in status, stable for surgery.  I have reviewed the patient's chart and labs.  Questions were answered to the patient's satisfaction.     Shelva Majestic

## 2018-10-01 NOTE — Progress Notes (Signed)
Site area: Right groin a 5 french arterial sheath was removed By  Sandy Salaam RN   Site Prior to Removal:  Level 0  Pressure Applied For 20 MINUTES    Bedrest Beginning at 0950am  Manual:   Yes.    Patient Status During Pull:  stable  Post Pull Groin Site:  Level 0  Post Pull Instructions Given:  Yes.    Post Pull Pulses Present:  Yes.    Dressing Applied:  Yes.    Comments:  BP 94/70- 88/84

## 2018-10-02 DIAGNOSIS — J410 Simple chronic bronchitis: Secondary | ICD-10-CM

## 2018-10-02 LAB — CBC
HCT: 47 % (ref 39.0–52.0)
Hemoglobin: 15.5 g/dL (ref 13.0–17.0)
MCH: 29.1 pg (ref 26.0–34.0)
MCHC: 33 g/dL (ref 30.0–36.0)
MCV: 88.2 fL (ref 80.0–100.0)
Platelets: 339 10*3/uL (ref 150–400)
RBC: 5.33 MIL/uL (ref 4.22–5.81)
RDW: 15.8 % — ABNORMAL HIGH (ref 11.5–15.5)
WBC: 6.3 10*3/uL (ref 4.0–10.5)
nRBC: 0 % (ref 0.0–0.2)

## 2018-10-02 LAB — BASIC METABOLIC PANEL
Anion gap: 11 (ref 5–15)
BUN: 15 mg/dL (ref 8–23)
CO2: 30 mmol/L (ref 22–32)
Calcium: 8.9 mg/dL (ref 8.9–10.3)
Chloride: 100 mmol/L (ref 98–111)
Creatinine, Ser: 0.97 mg/dL (ref 0.61–1.24)
GFR calc Af Amer: >60 mL/min (ref >60)
GFR calc non Af Amer: >60 mL/min (ref >60)
Glucose, Bld: 93 mg/dL (ref 70–99)
Potassium: 4.1 mmol/L (ref 3.5–5.1)
Sodium: 141 mmol/L (ref 135–145)

## 2018-10-02 LAB — CULTURE, BODY FLUID W GRAM STAIN -BOTTLE: Culture: NO GROWTH

## 2018-10-02 MED ORDER — DIAZEPAM 5 MG PO TABS: 5.0000 mg | ORAL_TABLET | ORAL | 0 refills | Status: DC | PRN

## 2018-10-02 MED ORDER — ASPIRIN 81 MG PO CHEW: 81.0000 mg | CHEWABLE_TABLET | Freq: Every day | ORAL | 3 refills | Status: DC

## 2018-10-02 MED ORDER — CARVEDILOL 3.125 MG PO TABS: 3.1250 mg | ORAL_TABLET | Freq: Two times a day (BID) | ORAL | 3 refills | Status: DC

## 2018-10-02 MED ORDER — TRAMADOL HCL 50 MG PO TABS: 50.0000 mg | ORAL_TABLET | Freq: Three times a day (TID) | ORAL | 0 refills | Status: DC | PRN

## 2018-10-02 MED ORDER — MOMETASONE FURO-FORMOTEROL FUM 200-5 MCG/ACT IN AERO: 2.0000 | INHALATION_SPRAY | Freq: Two times a day (BID) | RESPIRATORY_TRACT | 3 refills | Status: DC

## 2018-10-02 MED FILL — Verapamil HCl IV Soln 2.5 MG/ML: INTRAVENOUS | Qty: 2 | Status: AC

## 2018-10-02 NOTE — Progress Notes (Signed)
[facility]                                                                              Patient Demographics  Jared Stevens, is a 68 y.o. male, DOB - [birth year], RKY:[id number]  Admit date - 09/27/2018   Admitting Physician Norval Morton, MD  Outpatient Primary MD for the patient is Patient, No Pcp Per  Outpatient specialists:   LOS - 5  days   Medical records reviewed and are as summarized below:    Chief Complaint  Patient presents with   Shortness of Breath       Brief summary   68 year old male with history of COPD not on O2, adenocarcinoma of the lung followed by Dr. Julien Nordmann status post chemo and radiation, hyperlipidemia presented with shortness of breath for 2 days prior to admission.  He was seen on April 9 for the same.  Patient received thoracentesis and was discharged home.  He also reported lower extremity swelling, orthopnea, DOE, worsening cough.  He has continued to smoke intermittently, 1 pack of cigarettes lasting 4 days.  Patient was found to have hypoxia with sats 87% on room air, chest x-ray showed moderate right-sided pleural effusion.  Patient was admitted for further work-up   Assessment & Plan    Principal Problem: Acute respiratory failure with hypoxia, new diagnosis of acute systolic CHF ([place]) -Presented with dyspnea, hypoxia, bilateral lower extremity edema, no chest pain -2D echo 5/1 showed EF of 15%, severely reduced right ventricular function. 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  EF 15% -Lasix currently held, patient symptoms have improved with lower extremity swelling and shortness of breath. -Started on Coreg, aspirin, statin -Cardiology recommended LifeVest and ICD evaluation  Active Problems: Right-sided pleural effusion - Recurrent, patient had thoracentesis done on 09/27/2018 -1.7 L removed, currently no shortness of breath May benefit from Pleurx  catheter in future if recurrent right-sided pleural effusion    History of lung cancer stage III, right -Follows Dr. Julien Nordmann, reports finished chemo/XRT in 2018  COPD -Currently stable, no wheezing, recommended to quit smoking   Code Status: Full code DVT Prophylaxis:  Lovenox  Family Communication: Discussed in detail with the patient, all imaging results, lab results explained to the patient    Disposition Plan: Awaiting cardiology recommendations regarding LifeVest and ICD.  Will need LifeVest fitting prior to discharge.  Time Spent in minutes 35 minutes  Procedures:  Ultrasound-guided right thoracentesis  Consultants:   IR Cardiology Oncology  Antimicrobials:   Anti-infectives (From admission, onward)   None          Medications  Scheduled Meds:  amitriptyline  50 mg Oral QHS   aspirin  81 mg Oral Daily   atorvastatin  20 mg Oral q1800   carvedilol  3.125 mg Oral BID WC   enoxaparin (LOVENOX) injection  40 mg Subcutaneous Q24H   latanoprost  1 drop Both Eyes BID   mometasone-formoterol  2 puff Inhalation BID   nicotine  21 mg Transdermal Daily   sodium chloride flush  3 mL Intravenous Q12H   sodium chloride  flush  3 mL Intravenous Q12H   Continuous Infusions:  sodium chloride     PRN Meds:.sodium chloride, acetaminophen, albuterol, diazepam, HYDROcodone-acetaminophen, lidocaine, ondansetron **OR** ondansetron (ZOFRAN) IV, ondansetron (ZOFRAN) IV, sodium chloride flush      Subjective:   Jared Stevens was seen and examined today.  Overall feeling better, shortness of breath is improving.  Awaiting LifeVest.  No chest pain no fevers or chills. Patient denies dizziness,  abdominal pain, N/V/D/C, new weakness, numbess, tingling. No acute events overnight.    Objective:   Vitals:   10/02/18 0552 10/02/18 0552 10/02/18 0824 10/02/18 0850  BP:  110/82 94/83   Pulse:  (!) 104 88   Resp:  18    Temp:  98.2 F (36.8 C)    TempSrc:  Oral      SpO2:  100% 95% 93%  Weight: 60.6 kg     Height:        Intake/Output Summary (Last 24 hours) at 10/02/2018 1206 Last data filed at 10/02/2018 0840 Gross per 24 hour  Intake 323 ml  Output 1600 ml  Net -1277 ml     Wt Readings from Last 3 Encounters:  10/02/18 60.6 kg  09/05/18 74.8 kg  08/20/18 68 kg     Exam  General: Alert and oriented x 3, NAD  Eyes:   HEENT:    Cardiovascular: S1 S2 auscultated, Regular rate and rhythm.  Respiratory: Slightly diminished on the right, clear on the left  Gastrointestinal: Soft, nontender, nondistended, + bowel sounds  Ext: no pedal edema bilaterally  Neuro: No new deficits  Musculoskeletal: No digital cyanosis, clubbing  Skin: No rashes  Psych: Normal affect and demeanor, alert and oriented x3    Data Reviewed:  I have personally reviewed following labs and imaging studies  Micro Results Recent Results (from the past 240 hour(s))  Gram stain     Status: None   Collection Time: 09/27/18  9:49 AM  Result Value Ref Range Status   Specimen Description PLEURAL RIGHT  Final   Special Requests NONE  Final   Gram Stain   Final    RARE WBC PRESENT, PREDOMINANTLY PMN NO ORGANISMS SEEN Performed at [facility], [address], [place], [place] [address]    Report Status 09/27/2018 FINAL  Final  Culture, body fluid-bottle     Status: None   Collection Time: 09/27/18  9:49 AM  Result Value Ref Range Status   Specimen Description PLEURAL RIGHT  Final   Special Requests NONE  Final   Culture   Final    NO GROWTH 5 DAYS Performed at [facility] [address], [place], [place] [address]    Report Status 10/02/2018 FINAL  Final    Radiology Reports Dg Chest 1 View  Result Date: 09/27/2018 CLINICAL DATA:  Status post right thoracentesis. EXAM: CHEST  1 VIEW COMPARISON:  Radiographs of same day. FINDINGS: Stable cardiomegaly. Left lung is clear. No pneumothorax is noted. Stable moderate size loculated right  pleural effusion is noted. Bony thorax is unremarkable. IMPRESSION: No pneumothorax is noted status post thoracentesis. Grossly stable moderate size loculated right pleural effusion is noted. Electronically Signed   By: Marijo Conception M.D.   On: 09/27/2018 10:18   Dg Chest 2 View  Result Date: 09/27/2018 CLINICAL DATA:  68 year old male with shortness of breath, lung cancer. EXAM: CHEST - 2 VIEW COMPARISON:  09/05/2018 and earlier. FINDINGS: PA and lateral views of the chest. Mild re-accumulation of  the right pleural effusion is suspected since 09/05/2018. Moderate size effusion which appears at least partially loculated. Continued architectural distortion in the right lower lung. No superimposed pneumothorax or pulmonary edema. Stable cardiac size and mediastinal contours. Visualized tracheal air column is within normal limits. Stable left lung. Stable visualized osseous structures. Negative visible bowel gas pattern. IMPRESSION: 1. Moderate-sized and at least partially loculated right pleural effusion with some reaccumulation suspected since 09/05/2018. 2. Otherwise stable chest. Electronically Signed   By: Genevie Ann M.D.   On: 09/27/2018 01:31   Ct Chest W Contrast  Result Date: 09/05/2018 CLINICAL DATA:  Shortness of breath EXAM: CT CHEST WITH CONTRAST TECHNIQUE: Multidetector CT imaging of the chest was performed during intravenous contrast administration. CONTRAST:  48mL ISOVUE-300 IOPAMIDOL (ISOVUE-300) INJECTION 61% COMPARISON:  08/23/2018, PET-CT, 05/23/2018 FINDINGS: Cardiovascular: No significant vascular findings. Normal heart size. No pericardial effusion. Mediastinum/Nodes: No enlarged mediastinal, hilar, or axillary lymph nodes. Thyroid gland, trachea, and esophagus demonstrate no significant findings. Lungs/Pleura: Redemonstrated moderate to large right pleural effusion, not significantly changed compared to prior examination. There is resolved cavitation and decreased size of a spiculated,  masslike nodule of the anterior left lower lobe (series 3, image 127). No change in other masslike areas of consolidation, most conspicuous about the right hilum and in the anterior right lower lobe. Upper Abdomen: No acute abnormality. Musculoskeletal: No chest wall mass or suspicious bone lesions identified. IMPRESSION: Redemonstrated moderate to large right pleural effusion, not significantly changed compared to prior examination. There is resolved cavitation and decreased size of a spiculated, masslike nodule of the anterior left lower lobe (series 3, image 127). No change in other masslike areas of consolidation, most conspicuous about the right hilum and in the anterior right lower lobe. The appearance of these findings in general remains highly suspicious for malignancy although no PET avidity was appreciated on PET-CT dated 05/23/2018. No new airspace opacity or other findings to explain shortness of breath are noted on current examination. Electronically Signed   By: Eddie Candle M.D.   On: 09/05/2018 10:47   Dg Chest Port 1 View  Result Date: 09/05/2018 CLINICAL DATA:  Status post right thoracentesis. EXAM: PORTABLE CHEST 1 VIEW COMPARISON:  CT chest and single view of the chest earlier today. FINDINGS: Right pleural effusion is decreased after thoracentesis. No pneumothorax. Streaky atelectasis left lung base noted. Small cavitary lesion in the anterior left lower lobe is not visible on this study. Cardiac silhouette is partially obscured. No acute or focal bony abnormality. IMPRESSION: Decreased right effusion after thoracentesis. No new abnormality negative for pneumothorax. Electronically Signed   By: Inge Rise M.D.   On: 09/05/2018 15:47   Dg Chest Port 1 View  Result Date: 09/05/2018 CLINICAL DATA:  Cancer/pneumonia EXAM: PORTABLE CHEST 1 VIEW COMPARISON:  06/23/2018 radiograph.  Chest CT 08/23/2018 FINDINGS: Moderate to large right pleural effusion with opacified right base which is  both collapsed/opacified-see prior CT. The left chest is radiographically clear. Mild cardiomegaly. IMPRESSION: Large right pleural effusion obscuring pulmonary collapse at the right base, see chest CT 08/23/2018 Electronically Signed   By: Monte Fantasia M.D.   On: 09/05/2018 08:49   Ir Thoracentesis Asp Pleural Space W/img Guide  Result Date: 09/27/2018 INDICATION: Shortness of breath. Large right pleural effusion. Request diagnostic and therapeutic thoracentesis. EXAM: ULTRASOUND GUIDED RIGHT THORACENTESIS MEDICATIONS: None. COMPLICATIONS: None immediate. PROCEDURE: An ultrasound guided thoracentesis was thoroughly discussed with the patient and questions answered. The benefits, risks, alternatives and complications were also discussed.  The patient understands and wishes to proceed with the procedure. Written consent was obtained. Ultrasound was performed to localize and mark an adequate pocket of fluid in the right chest. The area was then prepped and draped in the normal sterile fashion. 1% Lidocaine was used for local anesthesia. Under ultrasound guidance a 6 Fr Safe-T-Centesis catheter was introduced. Thoracentesis was performed. The catheter was removed and a dressing applied. FINDINGS: A total of approximately 1.7 L of clear yellow fluid was removed. Samples were sent to the laboratory as requested by the clinical team. IMPRESSION: Successful ultrasound guided right thoracentesis yielding 1.7 L of pleural fluid. Read by: Ascencion Dike PA-C Electronically Signed   By: Markus Daft M.D.   On: 09/27/2018 10:10    Lab Data:  CBC: Recent Labs  Lab 09/27/18 0102 09/28/18 0221 10/01/18 5366 10/01/18 4403 10/01/18 0902 10/01/18 1048 10/02/18 0509  WBC 7.2 6.4  --   --   --  6.5 6.3  NEUTROABS 4.9  --   --   --   --   --   --   HGB 14.0 13.6 16.3 16.3 15.3 16.0 15.5  HCT 43.0 40.8 48.0 48.0 45.0 49.0 47.0  MCV 89.4 87.9  --   --   --  88.9 88.2  PLT 320 298  --   --   --  350 474   Basic  Metabolic Panel: Recent Labs  Lab 09/28/18 0221 09/29/18 0449 09/30/18 0531 10/01/18 0400 10/01/18 0821 10/01/18 0822 10/01/18 0902 10/01/18 1048 10/02/18 0509  NA 140 142 141 140 141 141 127*  --  141  K 4.0 3.7 3.7 3.9 4.3 4.3 3.9  --  4.1  CL 107 106 104 101  --   --   --   --  100  CO2 27 28 29 29   --   --   --   --  30  GLUCOSE 87 97 91 98  --   --   --   --  93  BUN 19 14 15 14   --   --   --   --  15  CREATININE 0.85 0.90 0.97 0.93  --   --   --  0.97 0.97  CALCIUM 8.5* 8.5* 8.9 8.9  --   --   --   --  8.9  MG  --  1.9  --  2.0  --   --   --   --   --    GFR: Estimated Creatinine Clearance: 63.3 mL/min (by C-G formula based on SCr of 0.97 mg/dL). Liver Function Tests: Recent Labs  Lab 09/27/18 0102 09/29/18 0449  AST 62* 31  ALT 51* 35  ALKPHOS 79 73  BILITOT 0.6 0.6  PROT 6.2* 5.6*  ALBUMIN 3.3* 2.7*   No results for input(s): LIPASE, AMYLASE in the last 168 hours. No results for input(s): AMMONIA in the last 168 hours. Coagulation Profile: No results for input(s): INR, PROTIME in the last 168 hours. Cardiac Enzymes: No results for input(s): CKTOTAL, CKMB, CKMBINDEX, TROPONINI in the last 168 hours. BNP (last 3 results) No results for input(s): PROBNP in the last 8760 hours. HbA1C: No results for input(s): HGBA1C in the last 72 hours. CBG: No results for input(s): GLUCAP in the last 168 hours. Lipid Profile: No results for input(s): CHOL, HDL, LDLCALC, TRIG, CHOLHDL, LDLDIRECT in the last 72 hours. Thyroid Function Tests: No results for input(s): TSH, T4TOTAL, FREET4, T3FREE, THYROIDAB in the last 72 hours. Anemia  Panel: No results for input(s): VITAMINB12, FOLATE, FERRITIN, TIBC, IRON, RETICCTPCT in the last 72 hours. Urine analysis:    Component Value Date/Time   COLORURINE YELLOW 09/05/2018 [place] 09/05/2018 0917   LABSPEC 1.023 09/05/2018 0917   PHURINE 5.0 09/05/2018 0917   GLUCOSEU NEGATIVE 09/05/2018 0917   HGBUR NEGATIVE  09/05/2018 [place] NEGATIVE 09/05/2018 [place] 09/05/2018 0917   PROTEINUR 30 (A) 09/05/2018 0917   NITRITE NEGATIVE 09/05/2018 0917   LEUKOCYTESUR NEGATIVE 09/05/2018 0917     Harman Langhans M.D. [facility] 10/02/2018, 12:06 PM  Pager: [phone] Between 7am to 7pm - call Pager - [phone]  After 7pm go to [email or web address] - password [email or web address]  Call night coverage person covering after 7pm

## 2018-10-02 NOTE — Progress Notes (Signed)
Progress Note  Patient Name: Jared Stevens Date of Encounter: 10/02/2018  Primary Cardiologist: Mertie Moores, MD - new  Subjective   68 year old gentleman who was admitted with heart failure. Heart catheterization revealed only an occlusion of his right coronary artery but nonobstructive disease in his LAD and circumflex artery.  He has systolic dysfunction appears to be nonischemic based on his heart catheterization.  There is been some discussion about placing a LifeVest on him.  I talked the case over with Dr. Rayann Heman.  In patients with nonischemic cardiomyopathy.  There is very little benefit in using a LifeVest.  These are much more effective in ischemic cardiomyopathies or following a large myocardial infarction.  Inpatient Medications    Scheduled Meds: . amitriptyline  50 mg Oral QHS  . aspirin  81 mg Oral Daily  . atorvastatin  20 mg Oral q1800  . carvedilol  3.125 mg Oral BID WC  . enoxaparin (LOVENOX) injection  40 mg Subcutaneous Q24H  . latanoprost  1 drop Both Eyes BID  . mometasone-formoterol  2 puff Inhalation BID  . nicotine  21 mg Transdermal Daily  . sodium chloride flush  3 mL Intravenous Q12H  . sodium chloride flush  3 mL Intravenous Q12H   Continuous Infusions: . sodium chloride     PRN Meds: sodium chloride, acetaminophen, albuterol, diazepam, HYDROcodone-acetaminophen, lidocaine, ondansetron **OR** ondansetron (ZOFRAN) IV, ondansetron (ZOFRAN) IV, sodium chloride flush   Vital Signs    Vitals:   10/01/18 1941 10/01/18 2034 10/02/18 0552 10/02/18 0552  BP: 111/71   110/82  Pulse: (!) 108   (!) 104  Resp:    18  Temp: 98.3 F (36.8 C)   98.2 F (36.8 C)  TempSrc: Oral   Oral  SpO2: 99% 96%  100%  Weight:   60.6 kg   Height:        Intake/Output Summary (Last 24 hours) at 10/02/2018 0759 Last data filed at 10/02/2018 0551 Gross per 24 hour  Intake 323 ml  Output 2000 ml  Net -1677 ml   Last 3 Weights 10/02/2018 10/01/2018 09/30/2018  Weight  (lbs) 133 lb 9.6 oz 133 lb 14.4 oz 133 lb 12.8 oz  Weight (kg) 60.601 kg 60.737 kg 60.691 kg      Telemetry      NSR  - Personally Reviewed  ECG     NSR  - Personally Reviewed  Physical Exam   GEN:  Middle-aged gentleman, no acute distress.  Very poor dentition. Neck: No JVD Cardiac: RRR, soft systolic murmur Respiratory: Clear to auscultation bilaterally. GI: Soft, nontender, non-distended  MS: No edema; No deformity. Neuro:  Nonfocal  Psych: Normal affect   Labs    Chemistry Recent Labs  Lab 09/27/18 0102  09/29/18 0449 09/30/18 0531 10/01/18 0400  10/01/18 0822 10/01/18 0902 10/01/18 1048 10/02/18 0509  NA 142   < > 142 141 140   < > 141 127*  --  141  K 4.0   < > 3.7 3.7 3.9   < > 4.3 3.9  --  4.1  CL 110   < > 106 104 101  --   --   --   --  100  CO2 23   < > 28 29 29   --   --   --   --  30  GLUCOSE 93   < > 97 91 98  --   --   --   --  93  BUN 22   < >  14 15 14   --   --   --   --  15  CREATININE 0.93   < > 0.90 0.97 0.93  --   --   --  0.97 0.97  CALCIUM 8.9   < > 8.5* 8.9 8.9  --   --   --   --  8.9  PROT 6.2*  --  5.6*  --   --   --   --   --   --   --   ALBUMIN 3.3*  --  2.7*  --   --   --   --   --   --   --   AST 62*  --  31  --   --   --   --   --   --   --   ALT 51*  --  35  --   --   --   --   --   --   --   ALKPHOS 79  --  73  --   --   --   --   --   --   --   BILITOT 0.6  --  0.6  --   --   --   --   --   --   --   GFRNONAA >60   < > >60 >60 >60  --   --   --  >60 >60  GFRAA >60   < > >60 >60 >60  --   --   --  >60 >60  ANIONGAP 9   < > 8 8 10   --   --   --   --  11   < > = values in this interval not displayed.     Hematology Recent Labs  Lab 09/28/18 0221  10/01/18 0902 10/01/18 1048 10/02/18 0509  WBC 6.4  --   --  6.5 6.3  RBC 4.64  --   --  5.51 5.33  HGB 13.6   < > 15.3 16.0 15.5  HCT 40.8   < > 45.0 49.0 47.0  MCV 87.9  --   --  88.9 88.2  MCH 29.3  --   --  29.0 29.1  MCHC 33.3  --   --  32.7 33.0  RDW 16.2*  --   --   16.0* 15.8*  PLT 298  --   --  350 339   < > = values in this interval not displayed.    Cardiac EnzymesNo results for input(s): TROPONINI in the last 168 hours. No results for input(s): TROPIPOC in the last 168 hours.   BNP Recent Labs  Lab 09/27/18 0103  BNP 1,225.5*     DDimer No results for input(s): DDIMER in the last 168 hours.   Radiology    No results found.  Cardiac Studies   Right and left heart cath 10/01/18:  Prox RCA to Dist RCA lesion is 100% stenosed.  LV end diastolic pressure is low.  There is severe left ventricular systolic dysfunction.   There is dilated left ventricle with severe LV dysfunction with EF estimate less than 15%. Low right heart pressure secondary to recent significant diuresis.  Single-vessel coronary obstructive disease with total proximal occlusion of the RCA with evidence for left to right distal collateralization. Normal large LAD and left circumflex vessels with collateralization to the distal RCA.  RECOMMENDATION: Medical therapy for severe combined LV systolic and diastolic function with potential  ultimate initiation of Entresto, aldosterone blockade, diuretics as blood pressure allow.  Consider of evaluation for LifeVest and subsequent ICD candidacy.   Echo 09/27/18: 1. The left ventricle has a visually estimated ejection fraction of 15%. The cavity size was moderately dilated. Left ventricular diastolic Doppler parameters are consistent with impaired relaxation.  2. The right ventricle has severely reduced systolic function. The cavity was normal. There is no increase in right ventricular wall thickness.  3. Trivial pericardial effusion is present.  4. The mitral valve is abnormal. Mild thickening of the mitral valve leaflet. There is mild mitral annular calcification present.  5. The aortic valve is tricuspid. Mild thickening of the aortic valve.  Patient Profile     68 y.o. male with hx of everyday smoker, cocaine use, COPD,  HLD, lung cancer, and newly diagnosed systolic heart failure - EF 15%.  Assessment & Plan    1. Acute onset systolic and diastolic heart failure-  EF 15% - BNP on admission 1225 - right and left heart cath yesterday revealed 1-vessel disease with total occlusion of the RCA, but left to right collaterals - has been diuresing on 20 mg IV lasix BID, no lasix ordered for today - he is overall net negative 12.5 L with 2L urine output yesterday - weight is 133 lbs from 148 lbs on admission - underwent right thoracentesis with 1.7L extracted (09/27/18) - he is breathing better today - medical therapy: coreg     2. Hypotension - somewhat improved with gentle fluids - renal function normal - may not tolerate high doses of lasix at this time   3. CAD - heart cath with occluded RCA with collaterals, no intervention - ASA and lipitor 20 mg   4. HLD - obtain lipid profile - on lipitor 20 mg   5. Lung cancer - per oncology, no recurrent cancer at this time   6. Cocaine, tobacco use - encouraged cessation        For questions or updates, please contact [facility] Please consult [email or web address] for contact info under        Signed, Ledora Bottcher, PA  10/02/2018, 7:59 AM    Attending Note:   The patient was seen and examined.  Agree with assessment and plan as noted above.  Changes made to the above note as needed.  Patient seen and independently examined with Doreene Adas , PA .   We discussed all aspects of the encounter. I agree with the assessment and plan as stated above.  1.   Non ischemic CM :   EF 15 % ,   No significant CAD to explain this . ? Cocaine, ? Chemotherapy, ? HTN, , ? ETOH. Seems to be feeling better I've discussed the case with Dr. Rayann Heman and he confims that the benefit of a Life Vest in patients with a nonischemic CM is very minimal  Ive encouraged him to avoid cocaine   HOme on coreg.   Will hold lasix.   He had a low EDP at time of cath  He  needs to avoid salty foods.  He will need to weigh daily  We may need to start some lasix as OP      I have spent a total of 40 minutes with patient reviewing hospital  notes , telemetry, EKGs, labs and examining patient as well as establishing an assessment and plan that was discussed with the patient. > 50% of time was spent in direct patient care.  Thayer Headings, Brooke Bonito., MD, [facility] 10/02/2018, 3:43 PM [address]  [place] [phone]

## 2018-10-02 NOTE — Discharge Summary (Signed)
Physician Discharge Summary   Patient ID: Jared Stevens MRN: [id number] DOB/AGE: 1951/02/21 68 y.o.  Admit date: 09/27/2018 Discharge date: 10/02/2018  Primary Care Physician:  Patient, No Pcp Per   Recommendations for Outpatient Follow-up:  1. Follow up with cardiology in 1-2 weeks 2. If recurrent pleural effusion, may benefit from Pleurx catheter in future  Home Health: None Equipment/Devices: None  Discharge Condition: stable* CODE STATUS: FULL  Diet recommendation: Heart healthy diet   Discharge Diagnoses:    . Acute on chronic respiratory failure with hypoxia (HCC) Acute systolic CHF Right-sided pleural effusion, recurrent . History of lung cancer stage III, right  . COPD (chronic obstructive pulmonary disease) ([place]) . Hyperlipidemia . Tobacco abuse   Consults: Cardiology IR    Allergies:  No Known Allergies   DISCHARGE MEDICATIONS: Allergies as of 10/02/2018   No Known Allergies     Medication List    STOP taking these medications   doxycycline 100 MG tablet Commonly known as:  VIBRA-TABS   HYDROcodone-acetaminophen 5-325 MG tablet Commonly known as:  NORCO/VICODIN   ibuprofen 600 MG tablet Commonly known as:  ADVIL   ibuprofen 800 MG tablet Commonly known as:  ADVIL   predniSONE 10 MG tablet Commonly known as:  DELTASONE   Symbicort 160-4.5 MCG/ACT inhaler Generic drug:  budesonide-formoterol Replaced by:  mometasone-formoterol 200-5 MCG/ACT Aero   varenicline 0.5 MG tablet Commonly known as:  Chantix     TAKE these medications   amitriptyline 50 MG tablet Commonly known as:  ELAVIL Take 50 mg by mouth at bedtime.   aspirin 81 MG chewable tablet Chew 1 tablet (81 mg total) by mouth daily. Start taking on:  Oct 03, 2018   atorvastatin 20 MG tablet Commonly known as:  LIPITOR Take 20 mg by mouth daily at 6 PM.   carvedilol 3.125 MG tablet Commonly known as:  COREG Take 1 tablet (3.125 mg total) by mouth 2 (two) times daily with a  meal.   latanoprost 0.005 % ophthalmic solution Commonly known as:  XALATAN Place 1 drop into both eyes 2 (two) times daily.   mometasone-formoterol 200-5 MCG/ACT Aero Commonly known as:  DULERA Inhale 2 puffs into the lungs 2 (two) times daily. Replaces:  Symbicort 160-4.5 MCG/ACT inhaler   traMADol 50 MG tablet Commonly known as:  Ultram Take 1 tablet (50 mg total) by mouth every 8 (eight) hours as needed.   Ventolin HFA 108 (90 Base) MCG/ACT inhaler Generic drug:  albuterol Inhale 1 puff into the lungs every 6 (six) hours as needed for wheezing or shortness of breath.   Albuterol Sulfate 2.5 MG/0.5ML Nebu Take 1 each by nebulization every 6 (six) hours as needed (for wheezing).        Brief H and P: For complete details please refer to admission H and P, but in brief 68 year old male with history of COPD not on O2, adenocarcinoma of the lung followed by Dr. Julien Nordmann status post chemo and radiation, hyperlipidemia presented with shortness of breath for 2 days prior to admission.  He was seen on April 9 for the same.  Patient received thoracentesis and was discharged home.  He also reported lower extremity swelling, orthopnea, DOE, worsening cough.  He has continued to smoke intermittently, 1 pack of cigarettes lasting 4 days.  Patient was found to have hypoxia with sats 87% on room air, chest x-ray showed moderate right-sided pleural effusion.  Patient was admitted for further work-up   Hospital Course:  Acute respiratory failure with  hypoxia, new diagnosis of acute systolic CHF ([place]) -Presented with dyspnea, hypoxia, bilateral lower extremity edema, no chest pain -2D echo 5/1 showed EF of 15%, severely reduced right ventricular function. 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  EF 15% -Lasix currently held, patient symptoms have improved with lower extremity swelling and shortness of breath. 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  Dr. Acie Fredrickson discussed the case with Dr. Rayann Heman, EP cardiology and confirmed that the benefit of a LifeVest in patients with nonischemic cardiomyopathy is very minimal.  Patient was encouraged not to use cocaine. -Per Dr. Acie Fredrickson, hold off on Lasix and recommended avoid salty foods.   Right-sided pleural effusion - Recurrent, patient had thoracentesis done on 09/27/2018 -1.7 L removed, currently no shortness of breath May benefit from Pleurx catheter in future if recurrent right-sided pleural effusion    History of lung cancer stage III, right -Follows Dr. Julien Nordmann, reports finished chemo/XRT in 2018  COPD -Currently stable, no wheezing, recommended to quit smoking   Day of Discharge S: Doing well, no complaints, no chest pain or shortness of breath  BP (!) 91/57 (BP Location: Left Arm)   Pulse (!) 103   Temp (!) 97.4 F (36.3 C) (Oral)   Resp 20   Ht 6\' 1"  (1.854 m)   Wt 60.6 kg Comment: scale c  SpO2 99%   BMI 17.63 kg/m   Physical Exam: General: Alert and awake oriented x3 not in any acute distress. HEENT: anicteric sclera, pupils reactive to light and accommodation CVS: S1-S2 clear no murmur rubs or gallops Chest: clear to auscultation bilaterally, no wheezing rales or rhonchi Abdomen: soft nontender, nondistended, normal bowel sounds Extremities: no cyanosis, clubbing or edema noted bilaterally Neuro: Cranial nerves II-XII intact, no focal neurological deficits   The results of significant diagnostics from this hospitalization (including imaging, microbiology, ancillary and laboratory) are listed below for reference.      Procedures/Studies:  Dg Chest 1 View  Result Date: 09/27/2018 CLINICAL DATA:  Status post right thoracentesis. EXAM: CHEST  1 VIEW COMPARISON:  Radiographs of same day. FINDINGS: Stable cardiomegaly. Left lung is clear. No  pneumothorax is noted. Stable moderate size loculated right pleural effusion is noted. Bony thorax is unremarkable. IMPRESSION: No pneumothorax is noted status post thoracentesis. Grossly stable moderate size loculated right pleural effusion is noted. Electronically Signed   By: Marijo Conception M.D.   On: 09/27/2018 10:18   Dg Chest 2 View  Result Date: 09/27/2018 CLINICAL DATA:  68 year old male with shortness of breath, lung cancer. EXAM: CHEST - 2 VIEW COMPARISON:  09/05/2018 and earlier. FINDINGS: PA and lateral views of the chest. Mild re-accumulation of the right pleural effusion is suspected since 09/05/2018. Moderate size effusion which appears at least partially loculated. Continued architectural distortion in the right lower lung. No superimposed pneumothorax or pulmonary edema. Stable cardiac size and mediastinal contours. Visualized tracheal air column is within normal limits. Stable left lung. Stable visualized osseous structures. Negative visible bowel gas pattern. IMPRESSION: 1. Moderate-sized and at least partially loculated right pleural effusion with some reaccumulation suspected since 09/05/2018. 2. Otherwise stable chest. Electronically Signed   By: Genevie Ann M.D.   On: 09/27/2018 01:31   Ct Chest W Contrast  Result Date: 09/05/2018 CLINICAL DATA:  Shortness of breath EXAM: CT CHEST WITH CONTRAST TECHNIQUE: Multidetector CT imaging of the chest was performed during  intravenous contrast administration. CONTRAST:  36mL ISOVUE-300 IOPAMIDOL (ISOVUE-300) INJECTION 61% COMPARISON:  08/23/2018, PET-CT, 05/23/2018 FINDINGS: Cardiovascular: No significant vascular findings. Normal heart size. No pericardial effusion. Mediastinum/Nodes: No enlarged mediastinal, hilar, or axillary lymph nodes. Thyroid gland, trachea, and esophagus demonstrate no significant findings. Lungs/Pleura: Redemonstrated moderate to large right pleural effusion, not significantly changed compared to prior examination. There is  resolved cavitation and decreased size of a spiculated, masslike nodule of the anterior left lower lobe (series 3, image 127). No change in other masslike areas of consolidation, most conspicuous about the right hilum and in the anterior right lower lobe. Upper Abdomen: No acute abnormality. Musculoskeletal: No chest wall mass or suspicious bone lesions identified. IMPRESSION: Redemonstrated moderate to large right pleural effusion, not significantly changed compared to prior examination. There is resolved cavitation and decreased size of a spiculated, masslike nodule of the anterior left lower lobe (series 3, image 127). No change in other masslike areas of consolidation, most conspicuous about the right hilum and in the anterior right lower lobe. The appearance of these findings in general remains highly suspicious for malignancy although no PET avidity was appreciated on PET-CT dated 05/23/2018. No new airspace opacity or other findings to explain shortness of breath are noted on current examination. Electronically Signed   By: Eddie Candle M.D.   On: 09/05/2018 10:47   Dg Chest Port 1 View  Result Date: 09/05/2018 CLINICAL DATA:  Status post right thoracentesis. EXAM: PORTABLE CHEST 1 VIEW COMPARISON:  CT chest and single view of the chest earlier today. FINDINGS: Right pleural effusion is decreased after thoracentesis. No pneumothorax. Streaky atelectasis left lung base noted. Small cavitary lesion in the anterior left lower lobe is not visible on this study. Cardiac silhouette is partially obscured. No acute or focal bony abnormality. IMPRESSION: Decreased right effusion after thoracentesis. No new abnormality negative for pneumothorax. Electronically Signed   By: Inge Rise M.D.   On: 09/05/2018 15:47   Dg Chest Port 1 View  Result Date: 09/05/2018 CLINICAL DATA:  Cancer/pneumonia EXAM: PORTABLE CHEST 1 VIEW COMPARISON:  06/23/2018 radiograph.  Chest CT 08/23/2018 FINDINGS: Moderate to large right  pleural effusion with opacified right base which is both collapsed/opacified-see prior CT. The left chest is radiographically clear. Mild cardiomegaly. IMPRESSION: Large right pleural effusion obscuring pulmonary collapse at the right base, see chest CT 08/23/2018 Electronically Signed   By: Monte Fantasia M.D.   On: 09/05/2018 08:49   Ir Thoracentesis Asp Pleural Space W/img Guide  Result Date: 09/27/2018 INDICATION: Shortness of breath. Large right pleural effusion. Request diagnostic and therapeutic thoracentesis. EXAM: ULTRASOUND GUIDED RIGHT THORACENTESIS MEDICATIONS: None. COMPLICATIONS: None immediate. PROCEDURE: An ultrasound guided thoracentesis was thoroughly discussed with the patient and questions answered. The benefits, risks, alternatives and complications were also discussed. The patient understands and wishes to proceed with the procedure. Written consent was obtained. Ultrasound was performed to localize and mark an adequate pocket of fluid in the right chest. The area was then prepped and draped in the normal sterile fashion. 1% Lidocaine was used for local anesthesia. Under ultrasound guidance a 6 Fr Safe-T-Centesis catheter was introduced. Thoracentesis was performed. The catheter was removed and a dressing applied. FINDINGS: A total of approximately 1.7 L of clear yellow fluid was removed. Samples were sent to the laboratory as requested by the clinical team. IMPRESSION: Successful ultrasound guided right thoracentesis yielding 1.7 L of pleural fluid. Read by: Ascencion Dike PA-C Electronically Signed   By: Scherrie Gerlach.D.  On: 09/27/2018 10:10       LAB RESULTS: Basic Metabolic Panel: Recent Labs  Lab 10/01/18 0400  10/01/18 0902 10/01/18 1048 10/02/18 0509  NA 140   < > 127*  --  141  K 3.9   < > 3.9  --  4.1  CL 101  --   --   --  100  CO2 29  --   --   --  30  GLUCOSE 98  --   --   --  93  BUN 14  --   --   --  15  CREATININE 0.93  --   --  0.97 0.97  CALCIUM 8.9  --    --   --  8.9  MG 2.0  --   --   --   --    < > = values in this interval not displayed.   Liver Function Tests: Recent Labs  Lab 09/27/18 0102 09/29/18 0449  AST 62* 31  ALT 51* 35  ALKPHOS 79 73  BILITOT 0.6 0.6  PROT 6.2* 5.6*  ALBUMIN 3.3* 2.7*   No results for input(s): LIPASE, AMYLASE in the last 168 hours. No results for input(s): AMMONIA in the last 168 hours. CBC: Recent Labs  Lab 09/27/18 0102  10/01/18 1048 10/02/18 0509  WBC 7.2   < > 6.5 6.3  NEUTROABS 4.9  --   --   --   HGB 14.0   < > 16.0 15.5  HCT 43.0   < > 49.0 47.0  MCV 89.4   < > 88.9 88.2  PLT 320   < > 350 339   < > = values in this interval not displayed.   Cardiac Enzymes: No results for input(s): CKTOTAL, CKMB, CKMBINDEX, TROPONINI in the last 168 hours. BNP: Invalid input(s): POCBNP CBG: No results for input(s): GLUCAP in the last 168 hours.    Disposition and Follow-up: Discharge Instructions    (HEART FAILURE PATIENTS) Call MD:  Anytime you have any of the following symptoms: 1) 3 pound weight gain in 24 hours or 5 pounds in 1 week 2) shortness of breath, with or without a dry hacking cough 3) swelling in the hands, feet or stomach 4) if you have to sleep on extra pillows at night in order to breathe.   Complete by:  As directed    Diet - low sodium heart healthy   Complete by:  As directed    Increase activity slowly   Complete by:  As directed        DISPOSITION: Home   DISCHARGE FOLLOW-UP Follow-up Information    Nahser, Wonda Cheng, MD. Schedule an appointment as soon as possible for a visit in 2 week(s).   Specialty:  Cardiology Why:  or tele health visit  Contact information: [address] [place] [address] [phone]            Time coordinating discharge:  35 minutes  Signed:   Estill Cotta M.D. [facility] 10/02/2018, 3:54 PM

## 2018-10-14 ENCOUNTER — Telehealth: Payer: Self-pay | Admitting: Cardiovascular Disease

## 2018-10-14 NOTE — Telephone Encounter (Signed)
I reviewed this patient's request with Dr. Acie Fredrickson. He states the patient needs to start with a virtual visit and it is going to be important for him to have a way to measure his BP going forward. I called and spoke with patient who agrees to a virtual visit with Dr. Acie Fredrickson on 5/26. He states he does not have a BP cuff and cannot afford to get one. I advised that I will try to reach out to management to determine if there is a way to get a cuff for him. He agrees to virtual visit via [email or web address] and thanked me for the call.    YOUR CARDIOLOGY TEAM HAS ARRANGED FOR AN E-VISIT FOR YOUR APPOINTMENT - PLEASE REVIEW IMPORTANT INFORMATION BELOW SEVERAL DAYS PRIOR TO YOUR APPOINTMENT  Due to the recent COVID-19 pandemic, we are transitioning in-person office visits to tele-medicine visits in an effort to decrease unnecessary exposure to our patients, their families, and staff. These visits are billed to your insurance just like a normal visit is. We also encourage you to sign up for MyChart if you have not already done so. You will need a smartphone if possible. For patients that do not have this, we can still complete the visit using a regular telephone but do prefer a smartphone to enable video when possible. You may have a family member that lives with you that can help. If possible, we also ask that you have a blood pressure cuff and scale at home to measure your blood pressure, heart rate and weight prior to your scheduled appointment. Patients with clinical needs that need an in-person evaluation and testing will still be able to come to the office if absolutely necessary. If you have any questions, feel free to call our office.     YOUR PROVIDER WILL BE USING THE FOLLOWING PLATFORM TO COMPLETE YOUR VISIT: Staff: [email or web address]   . IF USING DOXIMITY or [email or web address] - The staff will give you instructions on receiving your link to join the meeting the day of your visit.    2-3 DAYS BEFORE YOUR APPOINTMENT  You will  receive a telephone call from one of our [place] team members - your caller ID may say "Unknown caller." If this is a video visit, we will walk you through how to get the video launched on your phone. We will remind you check your blood pressure, heart rate and weight prior to your scheduled appointment. If you have an Apple Watch or Kardia, please upload any pertinent ECG strips the day before or morning of your appointment to [facility]. Our staff will also make sure you have reviewed the consent and agree to move forward with your scheduled tele-health visit.    THE DAY OF YOUR APPOINTMENT  Approximately 15 minutes prior to your scheduled appointment, you will receive a telephone call from one of [place] team - your caller ID may say "Unknown caller."  Our staff will confirm medications, vital signs for the day and any symptoms you may be experiencing. Please have this information available prior to the time of visit start. It may also be helpful for you to have a pad of paper and pen handy for any instructions given during your visit. They will also walk you through joining the smartphone meeting if this is a video visit.   CONSENT FOR TELE-HEALTH VISIT - PLEASE REVIEW  I hereby voluntarily request, consent and authorize [facility] and its employed or contracted physicians, Engineer, materials, nurse practitioners or other licensed health  care professionals (the Practitioner), to provide me with telemedicine health care services (the "Services") as deemed necessary by the treating Practitioner. I acknowledge and consent to receive the Services by the Practitioner via telemedicine. I understand that the telemedicine visit will involve communicating with the Practitioner through live audiovisual communication technology and the disclosure of certain medical information by electronic transmission. I acknowledge that I have been given the opportunity to request an in-person assessment or other  available alternative prior to the telemedicine visit and am voluntarily participating in the telemedicine visit.  I understand that I have the right to withhold or withdraw my consent to the use of telemedicine in the course of my care at any time, without affecting my right to future care or treatment, and that the Practitioner or I may terminate the telemedicine visit at any time. I understand that I have the right to inspect all information obtained and/or recorded in the course of the telemedicine visit and may receive copies of available information for a reasonable fee.  I understand that some of the potential risks of receiving the Services via telemedicine include:  Marland Kitchen Delay or interruption in medical evaluation due to technological equipment failure or disruption; . Information transmitted may not be sufficient (e.g. poor resolution of images) to allow for appropriate medical decision making by the Practitioner; and/or  . In rare instances, security protocols could fail, causing a breach of personal health information.  Furthermore, I acknowledge that it is my responsibility to provide information about my medical history, conditions and care that is complete and accurate to the best of my ability. I acknowledge that Practitioner's advice, recommendations, and/or decision may be based on factors not within their control, such as incomplete or inaccurate data provided by me or distortions of diagnostic images or specimens that may result from electronic transmissions. I understand that the practice of medicine is not an exact science and that Practitioner makes no warranties or guarantees regarding treatment outcomes. I acknowledge that I will receive a copy of this consent concurrently upon execution via email to the email address I last provided but may also request a printed copy by calling the office of [place].    I understand that my insurance will be billed for this visit.   I have  read or had this consent read to me. . I understand the contents of this consent, which adequately explains the benefits and risks of the Services being provided via telemedicine.  . I have been provided ample opportunity to ask questions regarding this consent and the Services and have had my questions answered to my satisfaction. . I give my informed consent for the services to be provided through the use of telemedicine in my medical care  By participating in this telemedicine visit I agree to the above.

## 2018-10-14 NOTE — Telephone Encounter (Signed)
Patient had procedure done on 10/01/18 and was told to make an appt to see Dr Acie Fredrickson within two weeks. I offered him a virtual appt on 10/22/18 and he said he needs to be seen in person. Please advise

## 2018-10-15 ENCOUNTER — Telehealth: Payer: Self-pay | Admitting: Licensed Clinical Social Worker

## 2018-10-15 NOTE — Telephone Encounter (Signed)
A BP cuff has been mailed to the patient's home by our Education officer, museum.

## 2018-10-15 NOTE — Telephone Encounter (Signed)
CSW referred to assist patient with obtaining a BP cuff. CSW contacted patient to inform cuff will be delivered to home next week. Patient grateful for support and assistance. CSW available as needed. Raquel Sarna, [place], [place]

## 2018-10-21 ENCOUNTER — Encounter (HOSPITAL_COMMUNITY): Payer: Self-pay | Admitting: Physician Assistant

## 2018-10-21 ENCOUNTER — Emergency Department (HOSPITAL_COMMUNITY): Payer: Medicare HMO | Admitting: Anesthesiology

## 2018-10-21 ENCOUNTER — Inpatient Hospital Stay (HOSPITAL_COMMUNITY)
Admission: EM | Admit: 2018-10-21 | Discharge: 2018-10-29 | DRG: 023 | Disposition: A | Discharge status: Skilled Nursing Facility | Payer: Medicare HMO | Attending: Neurology | Admitting: Neurology

## 2018-10-21 ENCOUNTER — Emergency Department (HOSPITAL_COMMUNITY): Payer: Medicare HMO

## 2018-10-21 ENCOUNTER — Other Ambulatory Visit: Payer: Self-pay

## 2018-10-21 ENCOUNTER — Inpatient Hospital Stay (HOSPITAL_COMMUNITY): Payer: Medicare HMO

## 2018-10-21 ENCOUNTER — Encounter (HOSPITAL_COMMUNITY)
Admission: EM | Disposition: A | Discharge status: Skilled Nursing Facility | Payer: Self-pay | Source: Home / Self Care | Attending: Neurology

## 2018-10-21 DIAGNOSIS — R32 Unspecified urinary incontinence: Secondary | ICD-10-CM | POA: Diagnosis present

## 2018-10-21 DIAGNOSIS — I255 Ischemic cardiomyopathy: Secondary | ICD-10-CM | POA: Diagnosis present

## 2018-10-21 DIAGNOSIS — T451X5A Adverse effect of antineoplastic and immunosuppressive drugs, initial encounter: Secondary | ICD-10-CM | POA: Diagnosis present

## 2018-10-21 DIAGNOSIS — Z7982 Long term (current) use of aspirin: Secondary | ICD-10-CM

## 2018-10-21 DIAGNOSIS — F17200 Nicotine dependence, unspecified, uncomplicated: Secondary | ICD-10-CM | POA: Diagnosis present

## 2018-10-21 DIAGNOSIS — J9690 Respiratory failure, unspecified, unspecified whether with hypoxia or hypercapnia: Secondary | ICD-10-CM

## 2018-10-21 DIAGNOSIS — G8194 Hemiplegia, unspecified affecting left nondominant side: Secondary | ICD-10-CM | POA: Diagnosis present

## 2018-10-21 DIAGNOSIS — I63311 Cerebral infarction due to thrombosis of right middle cerebral artery: Secondary | ICD-10-CM

## 2018-10-21 DIAGNOSIS — R4182 Altered mental status, unspecified: Secondary | ICD-10-CM | POA: Diagnosis present

## 2018-10-21 DIAGNOSIS — I9589 Other hypotension: Secondary | ICD-10-CM | POA: Diagnosis not present

## 2018-10-21 DIAGNOSIS — Z85118 Personal history of other malignant neoplasm of bronchus and lung: Secondary | ICD-10-CM | POA: Diagnosis not present

## 2018-10-21 DIAGNOSIS — F1410 Cocaine abuse, uncomplicated: Secondary | ICD-10-CM | POA: Diagnosis present

## 2018-10-21 DIAGNOSIS — R4702 Dysphasia: Secondary | ICD-10-CM | POA: Diagnosis present

## 2018-10-21 DIAGNOSIS — Z79899 Other long term (current) drug therapy: Secondary | ICD-10-CM

## 2018-10-21 DIAGNOSIS — I6601 Occlusion and stenosis of right middle cerebral artery: Secondary | ICD-10-CM | POA: Diagnosis not present

## 2018-10-21 DIAGNOSIS — E7800 Pure hypercholesterolemia, unspecified: Secondary | ICD-10-CM | POA: Diagnosis present

## 2018-10-21 DIAGNOSIS — I63411 Cerebral infarction due to embolism of right middle cerebral artery: Secondary | ICD-10-CM | POA: Diagnosis present

## 2018-10-21 DIAGNOSIS — I2510 Atherosclerotic heart disease of native coronary artery without angina pectoris: Secondary | ICD-10-CM | POA: Diagnosis present

## 2018-10-21 DIAGNOSIS — I959 Hypotension, unspecified: Secondary | ICD-10-CM | POA: Diagnosis present

## 2018-10-21 DIAGNOSIS — I639 Cerebral infarction, unspecified: Secondary | ICD-10-CM

## 2018-10-21 DIAGNOSIS — R29810 Facial weakness: Secondary | ICD-10-CM | POA: Diagnosis present

## 2018-10-21 DIAGNOSIS — Z1159 Encounter for screening for other viral diseases: Secondary | ICD-10-CM | POA: Diagnosis not present

## 2018-10-21 DIAGNOSIS — I428 Other cardiomyopathies: Secondary | ICD-10-CM | POA: Diagnosis present

## 2018-10-21 DIAGNOSIS — E785 Hyperlipidemia, unspecified: Secondary | ICD-10-CM | POA: Diagnosis present

## 2018-10-21 DIAGNOSIS — R0689 Other abnormalities of breathing: Secondary | ICD-10-CM | POA: Diagnosis not present

## 2018-10-21 DIAGNOSIS — Z0189 Encounter for other specified special examinations: Secondary | ICD-10-CM

## 2018-10-21 DIAGNOSIS — I5022 Chronic systolic (congestive) heart failure: Secondary | ICD-10-CM | POA: Diagnosis not present

## 2018-10-21 DIAGNOSIS — R471 Dysarthria and anarthria: Secondary | ICD-10-CM | POA: Diagnosis present

## 2018-10-21 DIAGNOSIS — I63511 Cerebral infarction due to unspecified occlusion or stenosis of right middle cerebral artery: Secondary | ICD-10-CM | POA: Diagnosis not present

## 2018-10-21 DIAGNOSIS — J9600 Acute respiratory failure, unspecified whether with hypoxia or hypercapnia: Secondary | ICD-10-CM | POA: Diagnosis present

## 2018-10-21 DIAGNOSIS — R414 Neurologic neglect syndrome: Secondary | ICD-10-CM | POA: Diagnosis present

## 2018-10-21 DIAGNOSIS — I5041 Acute combined systolic (congestive) and diastolic (congestive) heart failure: Secondary | ICD-10-CM | POA: Diagnosis not present

## 2018-10-21 DIAGNOSIS — I724 Aneurysm of artery of lower extremity: Secondary | ICD-10-CM | POA: Diagnosis not present

## 2018-10-21 DIAGNOSIS — Z923 Personal history of irradiation: Secondary | ICD-10-CM

## 2018-10-21 DIAGNOSIS — I5042 Chronic combined systolic (congestive) and diastolic (congestive) heart failure: Secondary | ICD-10-CM | POA: Diagnosis present

## 2018-10-21 DIAGNOSIS — Z79891 Long term (current) use of opiate analgesic: Secondary | ICD-10-CM

## 2018-10-21 DIAGNOSIS — E876 Hypokalemia: Secondary | ICD-10-CM | POA: Diagnosis not present

## 2018-10-21 DIAGNOSIS — I5023 Acute on chronic systolic (congestive) heart failure: Secondary | ICD-10-CM

## 2018-10-21 DIAGNOSIS — I472 Ventricular tachycardia: Secondary | ICD-10-CM | POA: Diagnosis present

## 2018-10-21 DIAGNOSIS — I6930 Unspecified sequelae of cerebral infarction: Secondary | ICD-10-CM | POA: Diagnosis present

## 2018-10-21 DIAGNOSIS — I110 Hypertensive heart disease with heart failure: Secondary | ICD-10-CM | POA: Diagnosis present

## 2018-10-21 DIAGNOSIS — J90 Pleural effusion, not elsewhere classified: Secondary | ICD-10-CM | POA: Diagnosis not present

## 2018-10-21 DIAGNOSIS — I5021 Acute systolic (congestive) heart failure: Secondary | ICD-10-CM | POA: Diagnosis not present

## 2018-10-21 DIAGNOSIS — J449 Chronic obstructive pulmonary disease, unspecified: Secondary | ICD-10-CM | POA: Diagnosis present

## 2018-10-21 HISTORY — PX: RADIOLOGY WITH ANESTHESIA: SHX6223

## 2018-10-21 HISTORY — PX: IR ANGIO VERTEBRAL SEL SUBCLAVIAN INNOMINATE UNI R MOD SED: IMG5365

## 2018-10-21 HISTORY — PX: IR ANGIO INTRA EXTRACRAN SEL COM CAROTID INNOMINATE UNI L MOD SED: IMG5358

## 2018-10-21 HISTORY — PX: IR CT HEAD LTD: IMG2386

## 2018-10-21 HISTORY — DX: Unspecified combined systolic (congestive) and diastolic (congestive) heart failure: I50.40

## 2018-10-21 HISTORY — PX: IR PERCUTANEOUS ART THROMBECTOMY/INFUSION INTRACRANIAL INC DIAG ANGIO: IMG6087

## 2018-10-21 LAB — URINALYSIS, ROUTINE W REFLEX MICROSCOPIC
Bilirubin Urine: NEGATIVE
Glucose, UA: NEGATIVE mg/dL
Ketones, ur: NEGATIVE mg/dL
Leukocytes,Ua: NEGATIVE
Nitrite: NEGATIVE
Protein, ur: NEGATIVE mg/dL
Specific Gravity, Urine: 1.018 (ref 1.005–1.030)
pH: 6 (ref 5.0–8.0)

## 2018-10-21 LAB — DIFFERENTIAL
Abs Immature Granulocytes: 0.01 10*3/uL (ref 0.00–0.07)
Basophils Absolute: 0 10*3/uL (ref 0.0–0.1)
Basophils Relative: 1 %
Eosinophils Absolute: 0.1 10*3/uL (ref 0.0–0.5)
Eosinophils Relative: 2 %
Immature Granulocytes: 0 %
Lymphocytes Relative: 28 %
Lymphs Abs: 1.7 10*3/uL (ref 0.7–4.0)
Monocytes Absolute: 0.5 10*3/uL (ref 0.1–1.0)
Monocytes Relative: 8 %
Neutro Abs: 3.8 10*3/uL (ref 1.7–7.7)
Neutrophils Relative %: 61 %

## 2018-10-21 LAB — RAPID URINE DRUG SCREEN, HOSP PERFORMED
Amphetamines: NOT DETECTED
Barbiturates: NOT DETECTED
Benzodiazepines: POSITIVE — AB
Cocaine: NOT DETECTED
Opiates: NOT DETECTED
Tetrahydrocannabinol: NOT DETECTED

## 2018-10-21 LAB — COMPREHENSIVE METABOLIC PANEL
ALT: 25 U/L (ref 0–44)
AST: 23 U/L (ref 15–41)
Albumin: 3.1 g/dL — ABNORMAL LOW (ref 3.5–5.0)
Alkaline Phosphatase: 73 U/L (ref 38–126)
Anion gap: 10 (ref 5–15)
BUN: 12 mg/dL (ref 8–23)
CO2: 27 mmol/L (ref 22–32)
Calcium: 9.1 mg/dL (ref 8.9–10.3)
Chloride: 106 mmol/L (ref 98–111)
Creatinine, Ser: 1.03 mg/dL (ref 0.61–1.24)
GFR calc Af Amer: >60 mL/min (ref >60)
GFR calc non Af Amer: >60 mL/min (ref >60)
Glucose, Bld: 81 mg/dL (ref 70–99)
Potassium: 3.9 mmol/L (ref 3.5–5.1)
Sodium: 143 mmol/L (ref 135–145)
Total Bilirubin: 0.6 mg/dL (ref 0.3–1.2)
Total Protein: 6.5 g/dL (ref 6.5–8.1)

## 2018-10-21 LAB — POCT I-STAT 7, (LYTES, BLD GAS, ICA,H+H)
Acid-Base Excess: 1 mmol/L (ref 0.0–2.0)
Bicarbonate: 24.7 mmol/L (ref 20.0–28.0)
Calcium, Ion: 1.18 mmol/L (ref 1.15–1.40)
HCT: 40 % (ref 39.0–52.0)
Hemoglobin: 13.6 g/dL (ref 13.0–17.0)
O2 Saturation: 100 %
Patient temperature: 97.6
Potassium: 3.6 mmol/L (ref 3.5–5.1)
Sodium: 139 mmol/L (ref 135–145)
TCO2: 26 mmol/L (ref 22–32)
pCO2 arterial: 36.5 mmHg (ref 32.0–48.0)
pH, Arterial: 7.437 (ref 7.350–7.450)
pO2, Arterial: 217 mmHg — ABNORMAL HIGH (ref 83.0–108.0)

## 2018-10-21 LAB — CBG MONITORING, ED: Glucose-Capillary: 78 mg/dL (ref 70–99)

## 2018-10-21 LAB — CBC
HCT: 44.5 % (ref 39.0–52.0)
Hemoglobin: 14.7 g/dL (ref 13.0–17.0)
MCH: 29.2 pg (ref 26.0–34.0)
MCHC: 33 g/dL (ref 30.0–36.0)
MCV: 88.5 fL (ref 80.0–100.0)
Platelets: 333 10*3/uL (ref 150–400)
RBC: 5.03 MIL/uL (ref 4.22–5.81)
RDW: 15.4 % (ref 11.5–15.5)
WBC: 6.1 10*3/uL (ref 4.0–10.5)
nRBC: 0 % (ref 0.0–0.2)

## 2018-10-21 LAB — I-STAT CHEM 8, ED
BUN: 13 mg/dL (ref 8–23)
Calcium, Ion: 1.13 mmol/L — ABNORMAL LOW (ref 1.15–1.40)
Chloride: 106 mmol/L (ref 98–111)
Creatinine, Ser: 0.9 mg/dL (ref 0.61–1.24)
Glucose, Bld: 77 mg/dL (ref 70–99)
HCT: 46 % (ref 39.0–52.0)
Hemoglobin: 15.6 g/dL (ref 13.0–17.0)
Potassium: 3.9 mmol/L (ref 3.5–5.1)
Sodium: 143 mmol/L (ref 135–145)
TCO2: 26 mmol/L (ref 22–32)

## 2018-10-21 LAB — TROPONIN I: Troponin I: 0.11 ng/mL (ref <0.03)

## 2018-10-21 LAB — APTT: aPTT: 36 seconds (ref 24–36)

## 2018-10-21 LAB — SARS CORONAVIRUS 2 BY RT PCR (HOSPITAL ORDER, PERFORMED IN ~~LOC~~ HOSPITAL LAB): SARS Coronavirus 2: NEGATIVE

## 2018-10-21 LAB — PROTIME-INR
INR: 1.2 (ref 0.8–1.2)
Prothrombin Time: 15.1 seconds (ref 11.4–15.2)

## 2018-10-21 LAB — GLUCOSE, CAPILLARY: Glucose-Capillary: 73 mg/dL (ref 70–99)

## 2018-10-21 LAB — ETHANOL: Alcohol, Ethyl (B): <10 mg/dL (ref <10)

## 2018-10-21 SURGERY — RADIOLOGY WITH ANESTHESIA
Anesthesia: General

## 2018-10-21 MED ORDER — CHLORHEXIDINE GLUCONATE 0.12% ORAL RINSE (MEDLINE KIT)
15.0000 mL | Freq: Two times a day (BID) | OROMUCOSAL | Status: DC
  Administered 2018-10-21 – 2018-10-23 (×4): 15 mL via OROMUCOSAL

## 2018-10-21 MED ORDER — LIDOCAINE HCL 1 % IJ SOLN
INTRAMUSCULAR | Status: AC
  Filled 2018-10-21: qty 20

## 2018-10-21 MED ORDER — MIDAZOLAM HCL 5 MG/5ML IJ SOLN
INTRAMUSCULAR | Status: DC | PRN
  Administered 2018-10-21 (×2): 1 mg via INTRAVENOUS

## 2018-10-21 MED ORDER — SODIUM CHLORIDE 0.9 % IV SOLN
INTRAVENOUS | Status: DC
  Administered 2018-10-22 – 2018-10-25 (×2): via INTRAVENOUS

## 2018-10-21 MED ORDER — CARVEDILOL 3.125 MG PO TABS: 3.1250 mg | ORAL_TABLET | Freq: Two times a day (BID) | ORAL | Status: DC

## 2018-10-21 MED ORDER — SODIUM CHLORIDE 0.9 % IV SOLN: INTRAVENOUS | Status: DC

## 2018-10-21 MED ORDER — ACETAMINOPHEN 650 MG RE SUPP: 650.0000 mg | RECTAL | Status: DC | PRN

## 2018-10-21 MED ORDER — ATORVASTATIN CALCIUM 10 MG PO TABS: 20.0000 mg | ORAL_TABLET | Freq: Every day | ORAL | Status: DC

## 2018-10-21 MED ORDER — SODIUM CHLORIDE 0.9 % IV SOLN
INTRAVENOUS | Status: DC | PRN
  Administered 2018-10-21: 19:00:00 via INTRAVENOUS

## 2018-10-21 MED ORDER — SODIUM CHLORIDE 0.9 % IV SOLN
INTRAVENOUS | Status: DC | PRN
  Administered 2018-10-21: 40 ug/min via INTRAVENOUS

## 2018-10-21 MED ORDER — ACETAMINOPHEN 325 MG PO TABS
650.0000 mg | ORAL_TABLET | ORAL | Status: DC | PRN
  Administered 2018-10-23: 650 mg via ORAL
  Filled 2018-10-21: qty 2

## 2018-10-21 MED ORDER — CLOPIDOGREL BISULFATE 300 MG PO TABS
ORAL_TABLET | ORAL | Status: AC
  Filled 2018-10-21: qty 1

## 2018-10-21 MED ORDER — TICAGRELOR 90 MG PO TABS
ORAL_TABLET | ORAL | Status: AC
  Filled 2018-10-21: qty 2

## 2018-10-21 MED ORDER — FENTANYL CITRATE (PF) 100 MCG/2ML IJ SOLN
INTRAMUSCULAR | Status: AC
  Filled 2018-10-21: qty 2

## 2018-10-21 MED ORDER — PANTOPRAZOLE SODIUM 40 MG IV SOLR
40.0000 mg | Freq: Every day | INTRAVENOUS | Status: DC
  Administered 2018-10-21 – 2018-10-22 (×2): 40 mg via INTRAVENOUS
  Filled 2018-10-21 (×2): qty 40

## 2018-10-21 MED ORDER — STROKE: EARLY STAGES OF RECOVERY BOOK
Freq: Once | Status: AC
  Administered 2018-10-21: 23:00:00
  Filled 2018-10-21: qty 1

## 2018-10-21 MED ORDER — CLEVIDIPINE BUTYRATE 0.5 MG/ML IV EMUL
0.0000 mg/h | INTRAVENOUS | Status: AC
  Administered 2018-10-21: 1 mg/h via INTRAVENOUS
  Filled 2018-10-21: qty 50

## 2018-10-21 MED ORDER — FENTANYL CITRATE (PF) 100 MCG/2ML IJ SOLN
25.0000 ug | INTRAMUSCULAR | Status: DC | PRN
  Administered 2018-10-22 (×2): 25 ug via INTRAVENOUS
  Filled 2018-10-21 (×2): qty 2

## 2018-10-21 MED ORDER — ATORVASTATIN CALCIUM 10 MG PO TABS
20.0000 mg | ORAL_TABLET | Freq: Every day | ORAL | Status: DC
  Administered 2018-10-23 – 2018-10-28 (×6): 20 mg
  Filled 2018-10-21 (×6): qty 2

## 2018-10-21 MED ORDER — ROCURONIUM BROMIDE 50 MG/5ML IV SOLN
INTRAVENOUS | Status: DC | PRN
  Administered 2018-10-21: 60 mg via INTRAVENOUS

## 2018-10-21 MED ORDER — POTASSIUM CHLORIDE 20 MEQ/15ML (10%) PO SOLN
40.0000 meq | Freq: Once | ORAL | Status: AC
  Administered 2018-10-21: 40 meq
  Filled 2018-10-21: qty 30

## 2018-10-21 MED ORDER — PROPOFOL 500 MG/50ML IV EMUL
INTRAVENOUS | Status: DC | PRN
  Administered 2018-10-21: 40 ug/kg/min via INTRAVENOUS

## 2018-10-21 MED ORDER — MIDAZOLAM HCL 2 MG/2ML IJ SOLN: 1.0000 mg | INTRAMUSCULAR | Status: DC | PRN

## 2018-10-21 MED ORDER — IOHEXOL 350 MG/ML SOLN
100.0000 mL | Freq: Once | INTRAVENOUS | Status: AC | PRN
  Administered 2018-10-21: 100 mL via INTRAVENOUS

## 2018-10-21 MED ORDER — EPTIFIBATIDE 20 MG/10ML IV SOLN
INTRAVENOUS | Status: AC
  Filled 2018-10-21: qty 10

## 2018-10-21 MED ORDER — NITROGLYCERIN 1 MG/10 ML FOR IR/CATH LAB
INTRA_ARTERIAL | Status: AC
  Filled 2018-10-21: qty 10

## 2018-10-21 MED ORDER — IOHEXOL 300 MG/ML  SOLN
150.0000 mL | Freq: Once | INTRAMUSCULAR | Status: AC | PRN
  Administered 2018-10-21: 60 mL via INTRAVENOUS

## 2018-10-21 MED ORDER — CEFAZOLIN SODIUM-DEXTROSE 2-3 GM-%(50ML) IV SOLR
INTRAVENOUS | Status: DC | PRN
  Administered 2018-10-21: 2 g via INTRAVENOUS

## 2018-10-21 MED ORDER — ASPIRIN 325 MG PO TABS
ORAL_TABLET | ORAL | Status: AC
  Filled 2018-10-21: qty 1

## 2018-10-21 MED ORDER — ETOMIDATE 2 MG/ML IV SOLN
INTRAVENOUS | Status: DC | PRN
  Administered 2018-10-21: 20 mg via INTRAVENOUS

## 2018-10-21 MED ORDER — ALTEPLASE (STROKE) FULL DOSE INFUSION
0.9000 mg/kg | Freq: Once | INTRAVENOUS | Status: AC
  Administered 2018-10-21: 54.3 mg via INTRAVENOUS
  Filled 2018-10-21: qty 100

## 2018-10-21 MED ORDER — LABETALOL HCL 5 MG/ML IV SOLN: 20.0000 mg | Freq: Once | INTRAVENOUS | Status: DC

## 2018-10-21 MED ORDER — FENTANYL CITRATE (PF) 100 MCG/2ML IJ SOLN
25.0000 ug | INTRAMUSCULAR | Status: DC | PRN
  Administered 2018-10-22: 25 ug via INTRAVENOUS
  Filled 2018-10-21: qty 2

## 2018-10-21 MED ORDER — FENTANYL CITRATE (PF) 100 MCG/2ML IJ SOLN
INTRAMUSCULAR | Status: AC
  Administered 2018-10-21: 100 ug
  Filled 2018-10-21: qty 2

## 2018-10-21 MED ORDER — FUROSEMIDE 10 MG/ML IJ SOLN
40.0000 mg | Freq: Once | INTRAMUSCULAR | Status: AC
  Administered 2018-10-21: 40 mg via INTRAVENOUS
  Filled 2018-10-21: qty 4

## 2018-10-21 MED ORDER — FENTANYL CITRATE (PF) 100 MCG/2ML IJ SOLN: 50.0000 ug | INTRAMUSCULAR | Status: DC | PRN

## 2018-10-21 MED ORDER — PROPOFOL 1000 MG/100ML IV EMUL
0.0000 ug/kg/min | INTRAVENOUS | Status: DC
  Administered 2018-10-21: 10 ug/kg/min via INTRAVENOUS
  Administered 2018-10-22: 15 ug/kg/min via INTRAVENOUS
  Administered 2018-10-23: 20 ug/kg/min via INTRAVENOUS
  Filled 2018-10-21 (×3): qty 100

## 2018-10-21 MED ORDER — ACETAMINOPHEN 160 MG/5ML PO SOLN: 650.0000 mg | ORAL | Status: DC | PRN

## 2018-10-21 MED ORDER — PHENYLEPHRINE HCL-NACL 10-0.9 MG/250ML-% IV SOLN
INTRAVENOUS | Status: AC
  Filled 2018-10-21: qty 250

## 2018-10-21 MED ORDER — ORAL CARE MOUTH RINSE
15.0000 mL | OROMUCOSAL | Status: DC
  Administered 2018-10-21 – 2018-10-23 (×16): 15 mL via OROMUCOSAL

## 2018-10-21 MED ORDER — LATANOPROST 0.005 % OP SOLN
1.0000 [drp] | Freq: Two times a day (BID) | OPHTHALMIC | Status: DC
  Administered 2018-10-22 – 2018-10-29 (×15): 1 [drp] via OPHTHALMIC
  Filled 2018-10-21 (×2): qty 2.5

## 2018-10-21 MED ORDER — CALCIUM GLUCONATE-NACL 1-0.675 GM/50ML-% IV SOLN
1.0000 g | Freq: Once | INTRAVENOUS | Status: AC
  Administered 2018-10-21: 1000 mg via INTRAVENOUS
  Filled 2018-10-21: qty 50

## 2018-10-21 MED ORDER — MIDAZOLAM HCL 2 MG/2ML IJ SOLN
INTRAMUSCULAR | Status: AC
  Administered 2018-10-21: 2 mg
  Filled 2018-10-21: qty 2

## 2018-10-21 MED ORDER — FENTANYL CITRATE (PF) 250 MCG/5ML IJ SOLN
INTRAMUSCULAR | Status: DC | PRN
  Administered 2018-10-21 (×2): 50 ug via INTRAVENOUS

## 2018-10-21 MED ORDER — ALBUTEROL SULFATE (2.5 MG/3ML) 0.083% IN NEBU: 2.5000 mL | INHALATION_SOLUTION | Freq: Four times a day (QID) | RESPIRATORY_TRACT | Status: DC | PRN

## 2018-10-21 MED ORDER — SODIUM CHLORIDE (PF) 0.9 % IJ SOLN
INTRAVENOUS | Status: DC | PRN
  Administered 2018-10-21 (×3): 25 ug via INTRA_ARTERIAL

## 2018-10-21 MED ORDER — AMITRIPTYLINE HCL 25 MG PO TABS: 50.0000 mg | ORAL_TABLET | Freq: Every day | ORAL | Status: DC

## 2018-10-21 MED ORDER — ACETAMINOPHEN 325 MG PO TABS: 650.0000 mg | ORAL_TABLET | ORAL | Status: DC | PRN

## 2018-10-21 MED ORDER — CLEVIDIPINE BUTYRATE 0.5 MG/ML IV EMUL: 0.0000 mg/h | INTRAVENOUS | Status: DC

## 2018-10-21 MED ORDER — SENNOSIDES-DOCUSATE SODIUM 8.6-50 MG PO TABS
1.0000 | ORAL_TABLET | Freq: Every evening | ORAL | Status: DC | PRN
  Administered 2018-10-25: 1 via ORAL
  Filled 2018-10-21: qty 1

## 2018-10-21 MED ORDER — ROCURONIUM BROMIDE 100 MG/10ML IV SOLN
INTRAVENOUS | Status: DC | PRN
  Administered 2018-10-21 (×2): 50 mg via INTRAVENOUS
  Administered 2018-10-21: 30 mg via INTRAVENOUS

## 2018-10-21 MED ORDER — POTASSIUM CHLORIDE 20 MEQ/15ML (10%) PO SOLN
20.0000 meq | Freq: Two times a day (BID) | ORAL | Status: DC
  Administered 2018-10-22 (×2): 20 meq
  Filled 2018-10-21 (×2): qty 15

## 2018-10-21 MED ORDER — FUROSEMIDE 10 MG/ML IJ SOLN
20.0000 mg | Freq: Two times a day (BID) | INTRAMUSCULAR | Status: DC
  Administered 2018-10-22 – 2018-10-27 (×11): 20 mg via INTRAVENOUS
  Filled 2018-10-21 (×11): qty 2

## 2018-10-21 MED ORDER — TIROFIBAN HCL IN NACL 5-0.9 MG/100ML-% IV SOLN
INTRAVENOUS | Status: AC
  Filled 2018-10-21: qty 100

## 2018-10-21 MED ORDER — SODIUM CHLORIDE 0.9 % IV SOLN
50.0000 mL | Freq: Once | INTRAVENOUS | Status: AC
  Administered 2018-10-21: 50 mL via INTRAVENOUS

## 2018-10-21 MED ORDER — EPHEDRINE SULFATE 50 MG/ML IJ SOLN
INTRAMUSCULAR | Status: DC | PRN
  Administered 2018-10-21: 5 mg via INTRAVENOUS

## 2018-10-21 MED ORDER — ETOMIDATE 2 MG/ML IV SOLN: 20.0000 mg | Freq: Once | INTRAVENOUS | Status: DC

## 2018-10-21 MED ORDER — MIDAZOLAM HCL 2 MG/2ML IJ SOLN
INTRAMUSCULAR | Status: AC
  Filled 2018-10-21: qty 2

## 2018-10-21 MED ORDER — CHLORHEXIDINE GLUCONATE CLOTH 2 % EX PADS
6.0000 | MEDICATED_PAD | Freq: Every day | CUTANEOUS | Status: DC
  Administered 2018-10-21: 6 via TOPICAL

## 2018-10-21 MED ORDER — ROCURONIUM BROMIDE 50 MG/5ML IV SOLN
1.0000 mg/kg | Freq: Once | INTRAVENOUS | Status: DC
  Filled 2018-10-21: qty 6.03

## 2018-10-21 MED ORDER — MIDAZOLAM HCL 2 MG/2ML IJ SOLN
1.0000 mg | INTRAMUSCULAR | Status: DC | PRN
  Administered 2018-10-22 – 2018-10-23 (×7): 1 mg via INTRAVENOUS
  Filled 2018-10-21 (×7): qty 2

## 2018-10-21 NOTE — Progress Notes (Deleted)
{Choose 1 Note Type (Telehealth Visit or Telephone Visit):[phone]}   Date:  10/21/2018   ID:  Jared Stevens, DOB 13-May-1951, MRN [id number]  Patient Location: Home Provider Location: Office  PCP:  Patient, No Pcp Per Cardiologist:  Mertie Moores, MD  Electrophysiologist:  None   Evaluation Performed:  Follow-Up Visit  Chief Complaint:  CHF   History of Present Illness:    Jared Stevens is a 68 y.o. male with recent hospitalization for CJF  Cath revealed occlusion of RCA ,  He has systolic dysfunction thought to be nonischemic based on his heart cath   The patient {does/does not:200015} have symptoms concerning for COVID-19 infection (fever, chills, cough, or new shortness of breath).    Past Medical History:  Diagnosis Date  . COPD (chronic obstructive pulmonary disease) ([place])   . Hypercholesteremia   . Lung cancer ([place]) 03/2016   Past Surgical History:  Procedure Laterality Date  . Arm Surgery    . HERNIA REPAIR    . IR THORACENTESIS ASP PLEURAL SPACE W/IMG GUIDE  09/27/2018  . REPLANTATION THUMB    . RIGHT/LEFT HEART CATH AND CORONARY ANGIOGRAPHY N/A 10/01/2018   Procedure: RIGHT/LEFT HEART CATH AND CORONARY ANGIOGRAPHY;  Surgeon: Troy Sine, MD;  Location: [facility];  Service: Cardiovascular;  Laterality: N/A;     No outpatient medications have been marked as taking for the 10/22/18 encounter (Appointment) with Nahser, Wonda Cheng, MD.     Allergies:   Patient has no known allergies.   Social History   Tobacco Use  . Smoking status: Current Some Day Smoker  . Smokeless tobacco: Never Used  Substance Use Topics  . Alcohol use: Not Currently  . Drug use: Not Currently     Family Hx: The patient's family history includes Aneurysm in his mother.  ROS:   Please see the history of present illness.    *** All other systems reviewed and are negative.   Prior CV studies:   The following studies were reviewed today:  ***  Labs/Other Tests and  Data Reviewed:    EKG:  {EKG/Telemetry Strips [phone]}  Recent Labs: 09/27/2018: B Natriuretic Peptide 1,225.5 09/28/2018: TSH 0.789 10/01/2018: Magnesium 2.0 10/21/2018: ALT 25; BUN 13; Creatinine, Ser 0.90; Hemoglobin 15.6; Platelets 333; Potassium 3.9; Sodium 143   Recent Lipid Panel No results found for: CHOL, TRIG, HDL, CHOLHDL, LDLCALC, LDLDIRECT  Wt Readings from Last 3 Encounters:  10/21/18 133 lb (60.3 kg)  10/02/18 133 lb 9.6 oz (60.6 kg)  09/05/18 165 lb (74.8 kg)     Objective:    Vital Signs:  There were no vitals taken for this visit.   {HeartCare Virtual Exam (Optional):[phone]::"VITAL SIGNS:  reviewed"}  ASSESSMENT & PLAN:    1. ***  COVID-19 Education: The signs and symptoms of COVID-19 were discussed with the patient and how to seek care for testing (follow up with PCP or arrange E-visit).  ***The importance of social distancing was discussed today.  Time:   Today, I have spent *** minutes with the patient with telehealth technology discussing the above problems.     Medication Adjustments/Labs and Tests Ordered: Current medicines are reviewed at length with the patient today.  Concerns regarding medicines are outlined above.   Tests Ordered: No orders of the defined types were placed in this encounter.   Medication Changes: No orders of the defined types were placed in this encounter.   Disposition:  Follow up {follow up:15908}  Signed, Mertie Moores, MD  10/21/2018  8:56 PM    [facility]

## 2018-10-21 NOTE — Transfer of Care (Signed)
Immediate Anesthesia Transfer of Care Note  Patient: Jared Stevens  Procedure(s) Performed: RADIOLOGY WITH ANESTHESIA (N/A )  Patient Location: [facility]  Anesthesia Type:General  Level of Consciousness: sedated and Patient remains intubated per anesthesia plan  Airway & Oxygen Therapy: Patient remains intubated per anesthesia plan and Patient placed on Ventilator (see vital sign flow sheet for setting)  Post-op Assessment: Report given to RN and Post -op Vital signs reviewed and stable  Post vital signs: Reviewed and stable  Last Vitals:  Vitals Value Taken Time  BP 129/88 10/21/2018  9:50 PM  Temp 36.4 C 10/21/2018  9:47 PM  Pulse 98 10/21/2018  9:47 PM  Resp 15 10/21/2018  9:57 PM  SpO2 100 % 10/21/2018  9:47 PM  Vitals shown include unvalidated device data.  Last Pain:  Vitals:   10/21/18 2147  TempSrc: Oral         Complications: No apparent anesthesia complications

## 2018-10-21 NOTE — Procedures (Signed)
Bilateral common carotid arteriograms and RT VA arteriogram followed by revascularization of occluded Rt MCA M 1 seg with x 2 passes with the 85mm x 44mm embotrap retriever  device achieving a TICI 2b revascularization.

## 2018-10-21 NOTE — Progress Notes (Signed)
Patient ID: Jared Stevens, male   DOB: 20-Mar-1951, 68 y.o.   MRN: [id number] INR. [place] 20. New onset confusion,neglect ,dysarthria and lt facial weakness. MRSS 0  CT brain NO ICH ASPECTS 10. CTA occluded Rt MCA distal M1  CTP  Core of 9ml with reversible tissue of 158 ml  Endovascular option D/W patients daughter and sister. Procedure ,reasons risks of ICH of 10 %,worsening neuro deficit,death inability to revascularize and  vascular injury all discussed.They both understood and gave permission to proceed. S.Tyshan Enderle MD.

## 2018-10-21 NOTE — ED Notes (Signed)
Upon assessment, pt flaccid on L side, garbled speech, unable to answer any questions appropriately, only following command to squeeze right hand. MD Regenia Skeeter aware and coming to bedside.

## 2018-10-21 NOTE — H&P (Addendum)
Admission H&P    Chief Complaint: Acute onset of left sided weakness and left hemineglect  HPI: Jared Stevens is an 68 y.o. male presenting with acute onset of left sided weakness with hemineglect, left facial droop, confusion and dysphasia. He has no prior history of stroke, ICH or seizure. Ambulates and is normally functioning at baseline.Family heard him fall in the bathroom and found him between the toilet and the wall, unable to get up and incontinent of urine.   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    Recent TTE from 09/27/18 revealed estimated left ventricular ejection fraction of 15%. The cavity size was moderately dilated. Left ventricular diastolic Doppler parameters are consistent with impaired relaxation. The right ventricle had severely reduced systolic function. The cavity was normal. There is no increase in right ventricular wall thickness. Trivial pericardial effusion was present.   Past Medical History:  Diagnosis Date  . COPD (chronic obstructive pulmonary disease) ([place])   . Hypercholesteremia   . Lung cancer ([place]) 03/2016    Past Surgical History:  Procedure Laterality Date  . Arm Surgery    . HERNIA REPAIR    . IR THORACENTESIS ASP PLEURAL SPACE W/IMG GUIDE  09/27/2018  . REPLANTATION THUMB    . RIGHT/LEFT HEART CATH AND CORONARY ANGIOGRAPHY N/A 10/01/2018   Procedure: RIGHT/LEFT HEART CATH AND CORONARY ANGIOGRAPHY;  Surgeon: Troy Sine, MD;  Location: [facility];  Service: Cardiovascular;  Laterality: N/A;    Family History  Problem Relation Age of Onset  . Aneurysm Mother    Social History:  reports that he has been smoking. He has never used smokeless tobacco. He reports previous alcohol use. He reports previous drug use.  Allergies: No Known Allergies  (Not in a hospital admission)   ROS: Unable to obtain due to AMS  Physical Examination: Blood pressure (!) 107/92, pulse 94, temperature 98  F (36.7 C), temperature source Rectal, resp. rate 15, weight 60.3 kg, SpO2 98 %.  HEENT-  [place]. No brusing or lacerations to head.  Lungs - Respirations unlabored Extremities - No edema. Decreased muscle bulk x 4.   Neurologic Examination: Mental Status: Awake with decreased level of consciousness. Severe dysarthria noted. Garbled, unintelligible responses to all questions. Will follow only simple motor commands.  Cranial Nerves: II:  No blink to threat on the left. Right pupil 1.5 mm and difficult to assess reactivity. Left pupil 2.5 mm and reactive.  III,IV, VI: No ptosis. Rightward gaze deviation. Cannot track past midline to the left. No nystagmus. Unable to perform oculocephalic due to C-collar  V,VII: Left facial droop. Reacts to brow-ridge pressure bilaterally  VIII: hearing intact to voice IX,X: Unable to assess pharynx XI: Head preferentially rotated to the right. XII: Did not protrude tongue Motor/Sensory: RUE and RLE 5/5 to command and noxious.  LUE 0/5 LLE no movement to command and drops to bed without resistance to gravity, but withdraws with 2-3/5 strength to plantar stimulation Deep Tendon Reflexes:  No definite asymmetry of upper extremity reflexes or patellar reflexes. Right toe downgoing, left toe upgoing Cerebellar: Not following commands for assessment Gait: Unable to assess  Results for orders placed or performed during the hospital encounter of 10/21/18 (from the past 48 hour(s))  CBG monitoring, ED     Status: None   Collection Time: 10/21/18  5:26 PM  Result Value Ref Range   Glucose-Capillary 78 70 - 99 mg/dL  Protime-INR  Status: None   Collection Time: 10/21/18  5:29 PM  Result Value Ref Range   Prothrombin Time 15.1 11.4 - 15.2 seconds   INR 1.2 0.8 - 1.2    Comment: (NOTE) INR goal varies based on device and disease states. Performed at [facility], [place] [address], [place], [place] [address]   APTT     Status: None   Collection Time:  10/21/18  5:29 PM  Result Value Ref Range   aPTT 36 24 - 36 seconds    Comment: Performed at [facility] [address] [place], [place] [address]  CBC     Status: None   Collection Time: 10/21/18  5:29 PM  Result Value Ref Range   WBC 6.1 4.0 - 10.5 K/uL   RBC 5.03 4.22 - 5.81 MIL/uL   Hemoglobin 14.7 13.0 - 17.0 g/dL   HCT 44.5 39.0 - 52.0 %   MCV 88.5 80.0 - 100.0 fL   MCH 29.2 26.0 - 34.0 pg   MCHC 33.0 30.0 - 36.0 g/dL   RDW 15.4 11.5 - 15.5 %   Platelets 333 150 - 400 K/uL   nRBC 0.0 0.0 - 0.2 %    Comment: Performed at [facility], [place] [address], [place], [place] [address]  Differential     Status: None   Collection Time: 10/21/18  5:29 PM  Result Value Ref Range   Neutrophils Relative % 61 %   Neutro Abs 3.8 1.7 - 7.7 K/uL   Lymphocytes Relative 28 %   Lymphs Abs 1.7 0.7 - 4.0 K/uL   Monocytes Relative 8 %   Monocytes Absolute 0.5 0.1 - 1.0 K/uL   Eosinophils Relative 2 %   Eosinophils Absolute 0.1 0.0 - 0.5 K/uL   Basophils Relative 1 %   Basophils Absolute 0.0 0.0 - 0.1 K/uL   Immature Granulocytes 0 %   Abs Immature Granulocytes 0.01 0.00 - 0.07 K/uL    Comment: Performed at [facility] [address], [place], [place] [address]  I-stat chem 8, ED [facility] and [facility] only)     Status: Abnormal   Collection Time: 10/21/18  5:37 PM  Result Value Ref Range   Sodium 143 135 - 145 mmol/L   Potassium 3.9 3.5 - 5.1 mmol/L   Chloride 106 98 - 111 mmol/L   BUN 13 8 - 23 mg/dL   Creatinine, Ser 0.90 0.61 - 1.24 mg/dL   Glucose, Bld 77 70 - 99 mg/dL   Calcium, Ion 1.13 (L) 1.15 - 1.40 mmol/L   TCO2 26 22 - 32 mmol/L   Hemoglobin 15.6 13.0 - 17.0 g/dL   HCT 46.0 39.0 - 52.0 %   Ct Head Code Stroke Wo Contrast  Result Date: 10/21/2018 CLINICAL DATA:  Code stroke.  Stroke.  Left-sided weakness aphasia EXAM: CT HEAD WITHOUT CONTRAST TECHNIQUE: Contiguous axial images were obtained from the base of the skull through the vertex without intravenous contrast.  COMPARISON:  CT head 11/13/2017 FINDINGS: Brain: Generalized atrophy. Chronic ischemic changes in the white matter. Chronic infarct right anterior frontal lobe. No evidence of acute infarct, hemorrhage, mass Vascular: Hyperdense right MCA compatible with acute thrombus. This appears to be in the distal right M1 and M2 segments Skull: Negative Sinuses/Orbits: Chronic fracture right medial orbit. Chronic fracture left zygomatic arch. Cyst in the right maxillary sinus. No orbital mass lesion. Other: None ASPECTS ([place] Stroke Program Early CT Score) - Ganglionic level infarction (caudate, lentiform  nuclei, internal capsule, insula, M1-M3 cortex): 7 - Supraganglionic infarction (M4-M6 cortex): 3 Total score (0-10 with 10 being normal): 10 IMPRESSION: 1. Atrophy and chronic ischemic change. No acute infarct or hemorrhage 2. Hyperdense right MCA compatible with acute thrombus. 3. ASPECTS is 10 4. These results were called by telephone at the time of interpretation on 10/21/2018 at 5:48 pm to Dr. Cheral Marker , who verbally acknowledged these results. Electronically Signed   By: Franchot Gallo M.D.   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 Neurological exam consistent with large region of right MCA territory ischemia versus infarct. CT head negative for hemorrhage. Dense right MCA sign is noted.  2. CTA head and neck reveals right M1 and M2 occlusion with acute thrombus in the distal right M1 segment extending into the M2 branches bilaterally. This is occlusive with minimal collateral flow and poor opacification of right MCA branches. 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 3. Pertinent negatives on CTA: No other significant intracranial stenosis or occlusion. Carotid bifurcation widely patent bilaterally. Both vertebral arteries patent. 4. Stroke Risk Factors - Hypercholesterolemia and history of cancer (adenocarcinoma treated  with chemotherapy and radiotherapy in 2018, now in observation status with Dr. Earlie Server).  5. Covid-19 test has been ordered by EDP.  6. COPD. On inhalers at home for this.   Plan: 1. After comprehensive review of possible contraindications, she has no absolute contraindications to tPA administration. Patient is a tPA candidate. Discussed extensively the risks/benefits of tPA treatment vs. no treatment with his daughter, including risks of hemorrhage and death with tPA administration versus worse overall outcomes on average in patients within tPA time window who are not administered tPA. The patient's aphasia precludes meaningful medical decision making on his part at this time. Overall benefits of tPA regarding long-term prognosis are felt to outweigh risks. Family expressed understanding and wish to proceed with tPA. Telephone consent witnessed by SRT nurse.  2. The patient is an endovascular candidate. Preliminary consent obtained from the patient's daughter, Stormy Card, who can be contacted at [phone]. A second daughter, Cyncime, can be contacted at [phone].  3. Will admit to the Neurology service following VIR.  4. Post-VIR/Post-tPA orders to include BP management and frequent neuro checks. No antiplatelet medications or anticoagulants for at least 24 hours following tPA. Can start if repeat CT at 24 hours is negative for hemorrhage. DVT prophylaxis with SCDs.  5. Will need full stroke work up to include MRI brain and cardiac telemetry.  6. PT consult, OT consult, Speech consult 7. HgbA1c, fasting lipid panel 8. Recent TTE revealed EF of 15%. Given this result, the most likely etiology for the patient's stroke is felt to be cardioembolic. May be a candidate for long-term anticoagulation.  9. One of her home bronchodilators contains formoterol, which per a recent JAMA article has been associated with stroke and MI. This medication has been discontinued.   60 minutes spent in the emergent  neurological evaluation and management of this critically ill acute stroke patient   Electronically signed: Dr. Kerney Elbe 10/21/2018, 6:07 PM

## 2018-10-21 NOTE — Anesthesia Postprocedure Evaluation (Signed)
Anesthesia Post Note  Patient: Jared Stevens  Procedure(s) Performed: RADIOLOGY WITH ANESTHESIA (N/A )     Patient location during evaluation: SICU Anesthesia Type: General Level of consciousness: sedated Pain management: pain level controlled Vital Signs Assessment: post-procedure vital signs reviewed and stable Respiratory status: patient remains intubated per anesthesia plan Cardiovascular status: stable Postop Assessment: no apparent nausea or vomiting Anesthetic complications: no    Last Vitals:  Vitals:   10/21/18 2147 10/21/18 2200  BP: 129/88 (!) 133/119  Pulse: 98   Resp: 18 15  Temp: 36.4 C   SpO2: 100%     Last Pain:  Vitals:   10/21/18 2147  TempSrc: Oral                 Jared Stevens

## 2018-10-21 NOTE — Progress Notes (Signed)
PHARMACIST CODE STROKE RESPONSE  Notified to mix tPA at 1750 by Dr. Cheral Marker Delivered tPA to RN at 1756 Bolus started at 1804  tPA dose = 5.4mg  bolus over 1 minute followed by 48.9mg  for a total dose of 54.3mg  over 1 hour  Issues/delays encountered (if applicable): IV access   Jared Stevens, PharmD PGY2 Cardiology Pharmacy Resident Please check AMION for all Pharmacist numbers by unit 10/21/2018 6:11 PM

## 2018-10-21 NOTE — Progress Notes (Signed)
Bed control called, she states she was not aware that the pt was out of ED. Advised we will need a bed on 4[facility] neuro ICU. Bed control states she will work on this and call me back at [facility].

## 2018-10-21 NOTE — ED Notes (Signed)
ED Provider at bedside. 

## 2018-10-21 NOTE — ED Notes (Signed)
Bloodwork sent to main lab at [facility]

## 2018-10-21 NOTE — ED Provider Notes (Signed)
[facility] EMERGENCY DEPARTMENT Provider Note   CSN: [id number] Arrival date & time: 10/21/18  1704  LEVEL 5 CAVEAT - ALTERED MENTAL STATUS   History   Chief Complaint Chief Complaint  Patient presents with   Loss of Consciousness   Altered Mental Status    HPI Jared Stevens is a 68 y.o. male.     HPI  68 year old male presents after being found in the bathroom.  Last known normal around 4:00.  The patient reportedly went to the bathroom and family heard him fall and he was incontinent and altered.  History is otherwise very limited.  Past Medical History:  Diagnosis Date   COPD (chronic obstructive pulmonary disease) ([place])    Hypercholesteremia    Lung cancer ([place]) 03/2016    Patient Active Problem List   Diagnosis Date Noted   Recurrent right pleural effusion    Acute systolic CHF (congestive heart failure) ([place])    CHF exacerbation ([place]) 09/27/2018   History of lung cancer 09/27/2018   Acute on chronic respiratory failure with hypoxia ([place]) 09/27/2018   Elevated brain natriuretic peptide (BNP) level 09/27/2018   COPD (chronic obstructive pulmonary disease) ([place]) 09/27/2018   Hyperlipidemia 09/27/2018   Tobacco abuse 09/27/2018   Non-small cell lung cancer, right ([place]) 08/26/2018    Past Surgical History:  Procedure Laterality Date   Arm Surgery     HERNIA REPAIR     IR THORACENTESIS ASP PLEURAL SPACE W/IMG GUIDE  09/27/2018   REPLANTATION THUMB     RIGHT/LEFT HEART CATH AND CORONARY ANGIOGRAPHY N/A 10/01/2018   Procedure: RIGHT/LEFT HEART CATH AND CORONARY ANGIOGRAPHY;  Surgeon: Troy Sine, MD;  Location: [facility];  Service: Cardiovascular;  Laterality: N/A;        Home Medications    Prior to Admission medications   Medication Sig Start Date End Date Taking? Authorizing Provider  Albuterol Sulfate 2.5 MG/0.5ML NEBU Take 1 each by nebulization every 6 (six) hours as needed (for wheezing).  09/17/18    [provider]  amitriptyline (ELAVIL) 50 MG tablet Take 50 mg by mouth at bedtime. 09/17/18   [provider]  aspirin 81 MG chewable tablet Chew 1 tablet (81 mg total) by mouth daily. 10/03/18   Rai, Vernelle Emerald, MD  atorvastatin (LIPITOR) 20 MG tablet Take 20 mg by mouth daily at 6 PM.  04/22/18   [provider]  carvedilol (COREG) 3.125 MG tablet Take 1 tablet (3.125 mg total) by mouth 2 (two) times daily with a meal. 10/02/18   Rai, Ripudeep K, MD  latanoprost (XALATAN) 0.005 % ophthalmic solution Place 1 drop into both eyes 2 (two) times daily.  03/12/18   [provider]  mometasone-formoterol (DULERA) 200-5 MCG/ACT AERO Inhale 2 puffs into the lungs 2 (two) times daily. 10/02/18   Rai, Vernelle Emerald, MD  traMADol (ULTRAM) 50 MG tablet Take 1 tablet (50 mg total) by mouth every 8 (eight) hours as needed. 10/02/18 10/02/19  Rai, Ripudeep K, MD  VENTOLIN HFA 108 (90 Base) MCG/ACT inhaler Inhale 1 puff into the lungs every 6 (six) hours as needed for wheezing or shortness of breath.  04/11/18   [provider]    Family History Family History  Problem Relation Age of Onset   Aneurysm Mother     Social History Social History   Tobacco Use   Smoking status: Current Some Day Smoker   Smokeless tobacco: Never Used  Substance Use Topics   Alcohol  use: Not Currently   Drug use: Not Currently     Allergies   Patient has no known allergies.   Review of Systems Review of Systems  Unable to perform ROS: Mental status change     Physical Exam Updated Vital Signs BP 121/68    Pulse 98    Temp 98 F (36.7 C) (Rectal)    Resp (!) 28    SpO2 98%   Physical Exam Vitals signs and nursing note reviewed.  Constitutional:      Appearance: He is well-developed.  HENT:     Head: Normocephalic and atraumatic.     Right Ear: External ear normal.     Left Ear: External ear normal.     Nose: Nose normal.  Eyes:     General:        Right eye: No  discharge.        Left eye: No discharge.     Comments: Eyes are deviated to right, has left sided neglect  Neck:     Musculoskeletal: Neck supple.  Cardiovascular:     Rate and Rhythm: Normal rate and regular rhythm.     Heart sounds: Normal heart sounds.  Pulmonary:     Effort: Pulmonary effort is normal.     Breath sounds: Normal breath sounds.  Abdominal:     Palpations: Abdomen is soft.     Tenderness: There is no abdominal tenderness.  Skin:    General: Skin is warm and dry.  Neurological:     Mental Status: He is alert. He is disoriented.     Comments: Awake, alert, knows self, repeats address but doesn't answer questions well. Maybe mild left facial droop. LUE, LLE very weak, flaccid and neglect RUE, RLE 5/5  Psychiatric:        Mood and Affect: Mood is not anxious.      ED Treatments / Results  Labs (all labs ordered are listed, but only abnormal results are displayed) Labs Reviewed  COMPREHENSIVE METABOLIC PANEL - Abnormal; Notable for the following components:      Result Value   Albumin 3.1 (*)    All other components within normal limits  RAPID URINE DRUG SCREEN, HOSP PERFORMED - Abnormal; Notable for the following components:   Benzodiazepines POSITIVE (*)    All other components within normal limits  URINALYSIS, ROUTINE W REFLEX MICROSCOPIC - Abnormal; Notable for the following components:   Hgb urine dipstick SMALL (*)    Bacteria, UA RARE (*)    All other components within normal limits  TROPONIN I - Abnormal; Notable for the following components:   Troponin I 0.11 (*)    All other components within normal limits  I-STAT CHEM 8, ED - Abnormal; Notable for the following components:   Calcium, Ion 1.13 (*)    All other components within normal limits  POCT I-STAT 7, (LYTES, BLD GAS, ICA,H+H) - Abnormal; Notable for the following components:   pO2, Arterial 217.0 (*)    All other components within normal limits  SARS CORONAVIRUS 2 (HOSPITAL ORDER,  [facility])  MRSA PCR SCREENING  ETHANOL  PROTIME-INR  APTT  CBC  DIFFERENTIAL  HIV ANTIBODY (ROUTINE TESTING W REFLEX)  HEMOGLOBIN A1C  LIPID PANEL  CBC WITH DIFFERENTIAL/PLATELET  BASIC METABOLIC PANEL  TROPONIN I  TROPONIN I  TRIGLYCERIDES  BLOOD GAS, ARTERIAL  MAGNESIUM  PHOSPHORUS  CBG MONITORING, ED    EKG EKG Interpretation  Date/Time:  Monday Oct 21 2018  17:15:21 EDT Ventricular Rate:  99 PR Interval:    QRS Duration: 110 QT Interval:  394 QTC Calculation: 506 R Axis:   64 Text Interpretation:  Sinus rhythm Left ventricular hypertrophy Nonspecific T abnormalities, lateral leads Prolonged QT interval Confirmed by Sherwood Gambler [phone]) on 10/21/2018 5:18:42 PM   Radiology Ct Code Stroke Cta Head W/wo Contrast  Result Date: 10/21/2018 CLINICAL DATA:  Stroke.  Left-sided weakness. EXAM: CT ANGIOGRAPHY HEAD AND NECK CT PERFUSION BRAIN TECHNIQUE: Multidetector CT imaging of the head and neck was performed using the standard protocol during bolus administration of intravenous contrast. Multiplanar CT image reconstructions and MIPs were obtained to evaluate the vascular anatomy. Carotid stenosis measurements (when applicable) are obtained utilizing NASCET criteria, using the distal internal carotid diameter as the denominator. Multiphase CT imaging of the brain was performed following IV bolus contrast injection. Subsequent parametric perfusion maps were calculated using RAPID software. CONTRAST:  130m OMNIPAQUE IOHEXOL 350 MG/ML SOLN COMPARISON:  CT head 10/21/2018 FINDINGS: CTA NECK FINDINGS Aortic arch: Standard branching. Proximal great vessels widely patent. Decreased attenuation descending thoracic aorta likely due to beam attenuation from a large right pleural effusion. Ascending aorta normal in caliber and opacification. Right carotid system: Right carotid system widely patent without stenosis. Mild atherosclerotic calcification right internal  carotid artery below the skull base. Carotid bifurcation normal Left carotid system: Left carotid bifurcation widely patent without stenosis. No significant atherosclerotic disease. Vertebral arteries: Both vertebral arteries widely patent to the basilar without stenosis. Skeleton: Cervical spine spondylosis.  No acute bony abnormality Other neck: Negative for mass or adenopathy in the neck. Upper chest: Large right pleural effusion. Patient has a history of lung cancer. Review of the MIP images confirms the above findings CTA HEAD FINDINGS Anterior circulation: Cavernous carotid widely patent bilaterally with mild atherosclerotic calcification. Right M1 segment patent proximally. There is thrombus in the distal right M1 segment. There is occlusive thrombus in right M2 branches with poor collateral flow. Minimal opacification of right middle cerebral artery branches. Azygos anterior cerebral artery which supplies both pericallosal arteries without stenosis. Left middle cerebral artery widely patent. Posterior circulation: Both vertebral arteries patent to the basilar. PICA patent bilaterally. Basilar widely patent. Superior cerebellar and posterior cerebral arteries patent bilaterally. Fetal origin right posterior cerebral artery. Venous sinuses: Minimal venous contrast due to arterial phase scanning Anatomic variants: None Delayed phase: Not performed Review of the MIP images confirms the above findings CT Brain Perfusion Findings: ASPECTS: 10 CBF (<30%) Volume: 478mPerfusion (Tmax>6.0s) volume: 20453mismatch Volume: 158m47mfarction Location:Right MCA territory involving the frontotemporal and parietal lobe. IMPRESSION: 1. Acute thrombus distal right M1 segment extending into the M2 branches bilaterally. This is occlusive with minimal collateral flow and poor opacification of right MCA branches. 2. No other significant intracranial stenosis or occlusion 3. Carotid bifurcation widely patent bilaterally. Both  vertebral arteries patent. 4. 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 5. These results were called by telephone at the time of interpretation on 10/21/2018 at 6:17 pm to Dr. ERICKerney Elbeho verbally acknowledged these results. Electronically Signed   By: CharFranchot Gallo.   On: 10/21/2018 18:19   Ct Code Stroke Cta Neck W/wo Contrast  Result Date: 10/21/2018 CLINICAL DATA:  Stroke.  Left-sided weakness. EXAM: CT ANGIOGRAPHY HEAD AND NECK CT PERFUSION BRAIN TECHNIQUE: Multidetector CT imaging of the head and neck was performed using the standard protocol during bolus administration of intravenous contrast. Multiplanar CT  image reconstructions and MIPs were obtained to evaluate the vascular anatomy. Carotid stenosis measurements (when applicable) are obtained utilizing NASCET criteria, using the distal internal carotid diameter as the denominator. Multiphase CT imaging of the brain was performed following IV bolus contrast injection. Subsequent parametric perfusion maps were calculated using RAPID software. CONTRAST:  150m OMNIPAQUE IOHEXOL 350 MG/ML SOLN COMPARISON:  CT head 10/21/2018 FINDINGS: CTA NECK FINDINGS Aortic arch: Standard branching. Proximal great vessels widely patent. Decreased attenuation descending thoracic aorta likely due to beam attenuation from a large right pleural effusion. Ascending aorta normal in caliber and opacification. Right carotid system: Right carotid system widely patent without stenosis. Mild atherosclerotic calcification right internal carotid artery below the skull base. Carotid bifurcation normal Left carotid system: Left carotid bifurcation widely patent without stenosis. No significant atherosclerotic disease. Vertebral arteries: Both vertebral arteries widely patent to the basilar without stenosis. Skeleton: Cervical spine spondylosis.  No acute bony abnormality Other neck: Negative for mass or adenopathy in the neck. Upper  chest: Large right pleural effusion. Patient has a history of lung cancer. Review of the MIP images confirms the above findings CTA HEAD FINDINGS Anterior circulation: Cavernous carotid widely patent bilaterally with mild atherosclerotic calcification. Right M1 segment patent proximally. There is thrombus in the distal right M1 segment. There is occlusive thrombus in right M2 branches with poor collateral flow. Minimal opacification of right middle cerebral artery branches. Azygos anterior cerebral artery which supplies both pericallosal arteries without stenosis. Left middle cerebral artery widely patent. Posterior circulation: Both vertebral arteries patent to the basilar. PICA patent bilaterally. Basilar widely patent. Superior cerebellar and posterior cerebral arteries patent bilaterally. Fetal origin right posterior cerebral artery. Venous sinuses: Minimal venous contrast due to arterial phase scanning Anatomic variants: None Delayed phase: Not performed Review of the MIP images confirms the above findings CT Brain Perfusion Findings: ASPECTS: 10 CBF (<30%) Volume: 473mPerfusion (Tmax>6.0s) volume: 20475mismatch Volume: 158m20mfarction Location:Right MCA territory involving the frontotemporal and parietal lobe. IMPRESSION: 1. Acute thrombus distal right M1 segment extending into the M2 branches bilaterally. This is occlusive with minimal collateral flow and poor opacification of right MCA branches. 2. No other significant intracranial stenosis or occlusion 3. Carotid bifurcation widely patent bilaterally. Both vertebral arteries patent. 4. 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 5. These results were called by telephone at the time of interpretation on 10/21/2018 at 6:17 pm to Dr. ERICKerney Elbeho verbally acknowledged these results. Electronically Signed   By: CharFranchot Gallo.   On: 10/21/2018 18:19   Ct Code Stroke Cta Cerebral Perfusion W/wo  Contrast  Result Date: 10/21/2018 CLINICAL DATA:  Stroke.  Left-sided weakness. EXAM: CT ANGIOGRAPHY HEAD AND NECK CT PERFUSION BRAIN TECHNIQUE: Multidetector CT imaging of the head and neck was performed using the standard protocol during bolus administration of intravenous contrast. Multiplanar CT image reconstructions and MIPs were obtained to evaluate the vascular anatomy. Carotid stenosis measurements (when applicable) are obtained utilizing NASCET criteria, using the distal internal carotid diameter as the denominator. Multiphase CT imaging of the brain was performed following IV bolus contrast injection. Subsequent parametric perfusion maps were calculated using RAPID software. CONTRAST:  100mL56mIPAQUE IOHEXOL 350 MG/ML SOLN COMPARISON:  CT head 10/21/2018 FINDINGS: CTA NECK FINDINGS Aortic arch: Standard branching. Proximal great vessels widely patent. Decreased attenuation descending thoracic aorta likely due to beam attenuation from a large right pleural effusion. Ascending aorta normal in caliber and opacification. Right carotid  system: Right carotid system widely patent without stenosis. Mild atherosclerotic calcification right internal carotid artery below the skull base. Carotid bifurcation normal Left carotid system: Left carotid bifurcation widely patent without stenosis. No significant atherosclerotic disease. Vertebral arteries: Both vertebral arteries widely patent to the basilar without stenosis. Skeleton: Cervical spine spondylosis.  No acute bony abnormality Other neck: Negative for mass or adenopathy in the neck. Upper chest: Large right pleural effusion. Patient has a history of lung cancer. Review of the MIP images confirms the above findings CTA HEAD FINDINGS Anterior circulation: Cavernous carotid widely patent bilaterally with mild atherosclerotic calcification. Right M1 segment patent proximally. There is thrombus in the distal right M1 segment. There is occlusive thrombus in right  M2 branches with poor collateral flow. Minimal opacification of right middle cerebral artery branches. Azygos anterior cerebral artery which supplies both pericallosal arteries without stenosis. Left middle cerebral artery widely patent. Posterior circulation: Both vertebral arteries patent to the basilar. PICA patent bilaterally. Basilar widely patent. Superior cerebellar and posterior cerebral arteries patent bilaterally. Fetal origin right posterior cerebral artery. Venous sinuses: Minimal venous contrast due to arterial phase scanning Anatomic variants: None Delayed phase: Not performed Review of the MIP images confirms the above findings CT Brain Perfusion Findings: ASPECTS: 10 CBF (<30%) Volume: 1m Perfusion (Tmax>6.0s) volume: 2050mMismatch Volume: 15825mnfarction Location:Right MCA territory involving the frontotemporal and parietal lobe. IMPRESSION: 1. Acute thrombus distal right M1 segment extending into the M2 branches bilaterally. This is occlusive with minimal collateral flow and poor opacification of right MCA branches. 2. No other significant intracranial stenosis or occlusion 3. Carotid bifurcation widely patent bilaterally. Both vertebral arteries patent. 4. 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 5. These results were called by telephone at the time of interpretation on 10/21/2018 at 6:17 pm to Dr. ERIKerney Elbewho verbally acknowledged these results. Electronically Signed   By: ChaFranchot GalloD.   On: 10/21/2018 18:19   Ct Head Code Stroke Wo Contrast  Result Date: 10/21/2018 CLINICAL DATA:  Code stroke.  Stroke.  Left-sided weakness aphasia EXAM: CT HEAD WITHOUT CONTRAST TECHNIQUE: Contiguous axial images were obtained from the base of the skull through the vertex without intravenous contrast. COMPARISON:  CT head 11/13/2017 FINDINGS: Brain: Generalized atrophy. Chronic ischemic changes in the white matter. Chronic infarct right anterior  frontal lobe. No evidence of acute infarct, hemorrhage, mass Vascular: Hyperdense right MCA compatible with acute thrombus. This appears to be in the distal right M1 and M2 segments Skull: Negative Sinuses/Orbits: Chronic fracture right medial orbit. Chronic fracture left zygomatic arch. Cyst in the right maxillary sinus. No orbital mass lesion. Other: None ASPECTS ([facility] Program Early CT Score) - Ganglionic level infarction (caudate, lentiform nuclei, internal capsule, insula, M1-M3 cortex): 7 - Supraganglionic infarction (M4-M6 cortex): 3 Total score (0-10 with 10 being normal): 10 IMPRESSION: 1. Atrophy and chronic ischemic change. No acute infarct or hemorrhage 2. Hyperdense right MCA compatible with acute thrombus. 3. ASPECTS is 10 4. These results were called by telephone at the time of interpretation on 10/21/2018 at 5:48 pm to Dr. LinCheral Markerwho verbally acknowledged these results. Electronically Signed   By: ChaFranchot GalloD.   On: 10/21/2018 17:49    Procedures .Critical Care Performed by: GolSherwood GamblerD Authorized by: GolSherwood GamblerD   Critical care provider statement:    Critical care time (minutes):  45   Critical care time was exclusive of:  Separately  billable procedures and treating other patients   Critical care was necessary to treat or prevent imminent or life-threatening deterioration of the following conditions:  CNS failure or compromise and respiratory failure   Critical care was time spent personally by me on the following activities:  Discussions with consultants, evaluation of patient's response to treatment, examination of patient, obtaining history from patient or surrogate, ordering and performing treatments and interventions, ordering and review of laboratory studies, ordering and review of radiographic studies, pulse oximetry, re-evaluation of patient's condition, review of old charts and ventilator management   (including critical care  time)  Medications Ordered in ED Medications  etomidate (AMIDATE) injection 20 mg (has no administration in time range)  rocuronium (ZEMURON) injection 60.3 mg (has no administration in time range)  midazolam (VERSED) injection 1 mg (has no administration in time range)  etomidate (AMIDATE) injection (20 mg Intravenous Given 10/21/18 1827)  rocuronium (ZEMURON) injection (60 mg Intravenous Given 10/21/18 1828)  nitroGLYCERIN 100 mcg/mL intra-arterial injection (has no administration in time range)  nitroGLYCERIN 25 mcg in sodium chloride (PF) 0.9 % 59.71 mL (25 mcg/mL) syringe (25 mcg Intra-arterial Given 10/21/18 2015)  senna-docusate (Senokot-S) tablet 1 tablet (has no administration in time range)  pantoprazole (PROTONIX) injection 40 mg (40 mg Intravenous Given 10/21/18 2221)  labetalol (NORMODYNE) injection 20 mg (has no administration in time range)  albuterol (PROVENTIL) (2.5 MG/3ML) 0.083% nebulizer solution 2.5 mL (has no administration in time range)  latanoprost (XALATAN) 0.005 % ophthalmic solution 1 drop (has no administration in time range)  acetaminophen (TYLENOL) tablet 650 mg (has no administration in time range)    Or  acetaminophen (TYLENOL) solution 650 mg (has no administration in time range)    Or  acetaminophen (TYLENOL) suppository 650 mg (has no administration in time range)  0.9 %  sodium chloride infusion (has no administration in time range)  clevidipine (CLEVIPREX) infusion 0.5 mg/mL (1 mg/hr Intravenous New Bag/Given 10/21/18 2204)  Chlorhexidine Gluconate Cloth 2 % PADS 6 each (6 each Topical Given 10/21/18 2238)  chlorhexidine gluconate (MEDLINE KIT) (PERIDEX) 0.12 % solution 15 mL (15 mLs Mouth Rinse Given 10/21/18 2216)  MEDLINE mouth rinse (has no administration in time range)  calcium gluconate 1 g/ 50 mL sodium chloride IVPB (1,000 mg Intravenous New Bag/Given 10/21/18 2225)  atorvastatin (LIPITOR) tablet 20 mg (has no administration in time range)  propofol  (DIPRIVAN) 1000 MG/100ML infusion (has no administration in time range)  fentaNYL (SUBLIMAZE) injection 25 mcg (has no administration in time range)  fentaNYL (SUBLIMAZE) injection 25 mcg (has no administration in time range)  furosemide (LASIX) injection 20 mg (has no administration in time range)  potassium chloride 20 MEQ/15ML (10%) solution 40 mEq (has no administration in time range)  potassium chloride 20 MEQ/15ML (10%) solution 20 mEq (has no administration in time range)  alteplase (ACTIVASE) 1 mg/mL infusion 54.3 mg (0 mg/kg  60.3 kg Intravenous Stopped 10/21/18 1904)    Followed by  0.9 %  sodium chloride infusion (50 mLs Intravenous New Bag/Given 10/21/18 1915)  iohexol (OMNIPAQUE) 350 MG/ML injection 100 mL (100 mLs Intravenous Contrast Given 10/21/18 1754)  midazolam (VERSED) 2 MG/2ML injection (2 mg  Given 10/21/18 1835)  fentaNYL (SUBLIMAZE) 100 MCG/2ML injection (100 mcg  Given 10/21/18 1835)  fentaNYL (SUBLIMAZE) 100 MCG/2ML injection (  Override pull for Anesthesia 10/21/18 1931)  midazolam (VERSED) 2 MG/2ML injection (  Override pull for Anesthesia 10/21/18 1927)   stroke: mapping our early stages of recovery book (  Does not apply Given 10/21/18 2258)  iohexol (OMNIPAQUE) 300 MG/ML solution 150 mL (60 mLs Intravenous Contrast Given 10/21/18 2118)  iohexol (OMNIPAQUE) 300 MG/ML solution 150 mL (60 mLs Intravenous Contrast Given 10/21/18 2117)  furosemide (LASIX) injection 40 mg (40 mg Intravenous Given 10/21/18 2221)     Initial Impression / Assessment and Plan / ED Course  I have reviewed the triage vital signs and the nursing notes.  Pertinent labs & imaging results that were available during my care of the patient were reviewed by me and considered in my medical decision making (see chart for details).        Shortly after seeing patient, code stroke called after quick discussion with Dr. Cheral Marker.  Initially there was concern for may be postictal Todd's paralysis given the  urinary incontinence and fall.  However with the acute change, is possible he fell from the stroke.  Looking at his CT head and angiography, this appears to be an acute MCA infarct.  He was given TPA.  He was intubated for the interventional radiology procedure.  Please see Dr. Barbera Setters intubation note.  Taken to IR.  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

## 2018-10-21 NOTE — Progress Notes (Signed)
Patient ID: Jared Stevens, male   DOB: 17-Feb-1951, 68 y.o.   MRN: [id number] INR post procedure Ct brain reveals No gross ICH,mass effect or shift. ?focal hyperdensity in the Rt sylvian fissure cotrast stain +/- small focal sah. RT groin 75F sheath  To arterial flush. Distal pulses 1+ DPs and PTs bilaterally unchanged. S.Perris Conwell MD

## 2018-10-21 NOTE — Consult Note (Signed)
NAME:  Jared Stevens, MRN:  [id number], DOB:  [birth year], LOS: 0 ADMISSION DATE:  10/21/2018, CONSULTATION DATE:  10/21/18 REFERRING MD:  Jared Stevens  CHIEF COMPLAINT:  Left sided weakness   Brief History   Jared Stevens is a 68 y.o. male who was admitted 5/25 with R MCA infarct s/p tPA administration and VIR revascularization. Post op, transferred to ICU where he remained on the ventilator.  History of present illness   Pt is encephelopathic; therefore, this HPI is obtained from chart review. Jared Stevens is a 68 y.o. male who has a PMH including but not limited to stage III NSCLC (adenocarcinoma) s/p chemo / XRT 2018 and followed by Dr. Earlie Stevens (see "past medical history" for rest).  He presented to [facility] 5/25 with left sided weakness and hemineglect.  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  He was deemed to be a tPA candidate which was administered at [place].  He was then taken to VIR for revascularization of occluded RMCA.  Post operatively, he was transferred to the ICU and PCCM was asked to see in consultation for vent management.  Past Medical History  Stage III NSCLC (adenocarcinoma) s/p chemoradiation 2018, COPD, HLD.  Significant Hospital Events   5/25 > admit.  Consults:  PCCM. IR.  Procedures:  ETT 5/25 >  R fem sheath 5/25 >   Significant Diagnostic Tests:  CT Head 5/25 > hyperdense R MCA, aspects 10. CTA / CTP head 5/25 > R M1 thrombus extending into M2 branches, R MCA infarct with 36ml core infarct and 148ml penumbra. CT head 5/26 >  MRI brain 5/26 >  Echo 5/26 >   Micro Data:  SARS CoV 2 5/25 > negative.  Antimicrobials:  None.   Interim history/subjective:  On vent, comfortable.  Objective:  Blood pressure 94/82, pulse 96, temperature 98 F (36.7 C), resp. rate 15, height 6\' 1"  (1.854 m), weight 60.3 kg, SpO2 100 %.    Vent Mode: PRVC FiO2 (%):  [40 %] 40 % Set Rate:  [15 bmp] 15 bmp Vt Set:  [640 mL] 640 mL PEEP:  [5  cmH20] 5 cmH20 Plateau Pressure:  [16 cmH20] 16 cmH20   Intake/Output Summary (Last 24 hours) at 10/21/2018 2137 Last data filed at 10/21/2018 2103 Gross per 24 hour  Intake 554.3 ml  Output 1000 ml  Net -445.7 ml   Filed Weights   10/21/18 1730  Weight: 60.3 kg    Examination: General: Chronically ill appearing male, appears older than stated age, in NAD. Neuro: Sedated, unresponsive. HEENT: [place]/AT. Sclerae anicteric.  ETT in place. Cardiovascular: RRR, no M/R/G.  Lungs: Respirations even and unlabored.  CTA though diminished right base. Abdomen: BS x 4, soft, NT/ND.  Musculoskeletal: No gross deformities, no edema.  Skin: Intact, warm, no rashes.  Assessment & Plan:   R MCA occlusion - s/p VIR revascularization 5/25. - Post op management per IR. - Stroke workup / management per neuro. - Continue cleviprex for goal SBP 120 - 140 per neuro.  Respiratory insufficiency - due to inability to protect the airway in the setting of above. Hx COPD per report (no PFT's in Epic). - Full vent support. - Assess ABG. - Daily weaning as able. - Daily SBT. - Bronchial hygiene. - Follow CXR.  Elevated troponin - EKG normal. - Trend troponins.  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 - Hold preadmission ASA until 24 hrs after tPA. - Continue preadmission lipitor. - Primary team to please consult cardiology.  Hx stage III NSCLC (adenocarcinoma) s/p chemo / XRT in 2018 (followed by Dr. Earlie Stevens). - Outpatient f/u with oncology.  Hx right pleural effusion now with recurrence - s/p thoracentesis 09/27/18 with 1.7L clear yellow fluid removed.  Unable to determine etiology as no serum LDH sent at the time; though, favor transudative. - Thora restricted at this time given tPA administration at [place].  - Lasix 40mg  x 1 now then 20mg  BID starting in  AM (responded well to this dose on last admit). - Consider thora 5/27  if no improvement  Hypocalcemia. - 1g Ca gluconate.  Best Practice:  Diet: NPO. Pain/Anxiety/Delirium protocol (if indicated): Propofol gtt / Fentnanyl PRN / Midazolam PRN.  RASS goal 0 to -1. VAP protocol (if indicated): In place. DVT prophylaxis: SCD's. GI prophylaxis: Pantoprazole. Glucose control: None. Mobility: Bedrest. Code Status: Full. Family Communication: None available. Disposition: ICU.  Labs   CBC: Recent Labs  Lab 10/21/18 1729 10/21/18 1737  WBC 6.1  --   NEUTROABS 3.8  --   HGB 14.7 15.6  HCT 44.5 46.0  MCV 88.5  --   PLT 333  --    Basic Metabolic Panel: Recent Labs  Lab 10/21/18 1729 10/21/18 1737  NA 143 143  K 3.9 3.9  CL 106 106  CO2 27  --   GLUCOSE 81 77  BUN 12 13  CREATININE 1.03 0.90  CALCIUM 9.1  --    GFR: Estimated Creatinine Clearance: 67.9 mL/min (by C-G formula based on SCr of 0.9 mg/dL). Recent Labs  Lab 10/21/18 1729  WBC 6.1   Liver Function Tests: Recent Labs  Lab 10/21/18 1729  AST 23  ALT 25  ALKPHOS 73  BILITOT 0.6  PROT 6.5  ALBUMIN 3.1*   No results for input(s): LIPASE, AMYLASE in the last 168 hours. No results for input(s): AMMONIA in the last 168 hours. ABG    Component Value Date/Time   PHART 7.277 (L) 10/01/2018 0902   PCO2ART 57.3 (H) 10/01/2018 0902   PO2ART 155.0 (H) 10/01/2018 0902   HCO3 26.7 10/01/2018 0902   TCO2 26 10/21/2018 1737   ACIDBASEDEF 1.0 10/01/2018 0902   O2SAT 99.0 10/01/2018 0902    Coagulation Profile: Recent Labs  Lab 10/21/18 1729  INR 1.2   Cardiac Enzymes: Recent Labs  Lab 10/21/18 1729  TROPONINI 0.11*   HbA1C: No results found for: HGBA1C CBG: Recent Labs  Lab 10/21/18 1726  GLUCAP 78    Review of Systems:   Unable to obtain as pt is encephalopathic.  Past medical history  He,  has a past medical history of COPD (chronic obstructive pulmonary disease) ([place]),  Hypercholesteremia, and Lung cancer ([place]) (03/2016).   Surgical History    Past Surgical History:  Procedure Laterality Date  . Arm Surgery    . HERNIA REPAIR    . IR THORACENTESIS ASP PLEURAL SPACE W/IMG GUIDE  09/27/2018  . REPLANTATION THUMB    . RIGHT/LEFT HEART CATH AND CORONARY ANGIOGRAPHY N/A 10/01/2018   Procedure: RIGHT/LEFT HEART CATH AND CORONARY ANGIOGRAPHY;  Surgeon: Troy Sine, MD;  Location: [place] [facility];  Service: Cardiovascular;  Laterality: N/A;     Social History   reports that he has been smoking. He has never used smokeless tobacco. He reports previous alcohol use. He reports previous drug use.  Family history   His family history includes Aneurysm in his mother.   Allergies No Known Allergies   Home meds  Prior to Admission medications   Medication Sig Start Date End Date Taking? Authorizing Provider  Albuterol Sulfate 2.5 MG/0.5ML NEBU Take 1 each by nebulization every 6 (six) hours as needed (for wheezing).  09/17/18   [provider]  amitriptyline (ELAVIL) 50 MG tablet Take 50 mg by mouth at bedtime. 09/17/18   [provider]  aspirin 81 MG chewable tablet Chew 1 tablet (81 mg total) by mouth daily. 10/03/18   Rai, Vernelle Emerald, MD  atorvastatin (LIPITOR) 20 MG tablet Take 20 mg by mouth daily at 6 PM.  04/22/18   [provider]  carvedilol (COREG) 3.125 MG tablet Take 1 tablet (3.125 mg total) by mouth 2 (two) times daily with a meal. 10/02/18   Rai, Ripudeep K, MD  latanoprost (XALATAN) 0.005 % ophthalmic solution Place 1 drop into both eyes 2 (two) times daily.  03/12/18   [provider]  traMADol (ULTRAM) 50 MG tablet Take 1 tablet (50 mg total) by mouth every 8 (eight) hours as needed. 10/02/18 10/02/19  Rai, Ripudeep K, MD  VENTOLIN HFA 108 (90 Base) MCG/ACT inhaler Inhale 1 puff into the lungs every 6 (six) hours as needed for wheezing or shortness of breath.  04/11/18   [provider]    Critical care  time: 40 min.    Montey Hora, [facility] Pager: [phone].  If no answer, [phone] 10/21/2018, 9:37 PM

## 2018-10-21 NOTE — ED Provider Notes (Signed)
Procedure Name: Intubation Date/Time: 10/21/2018 6:36 PM Performed by: Doneta Public, MD Pre-anesthesia Checklist: Patient identified, Suction available, Timeout performed and Patient being monitored Oxygen Delivery Method: Non-rebreather mask Preoxygenation: Pre-oxygenation with 100% oxygen Induction Type: Rapid sequence Laryngoscope Size: Mac and 4 Grade View: Grade I Tube size: 7.5 mm Number of attempts: 1 Airway Equipment and Method: Rigid stylet and Video-laryngoscopy Secured at: 24 cm Tube secured with: ETT holder         Doneta Public, MD 10/21/18 Jared Stevens    Sherwood Gambler, MD 10/23/18 1258

## 2018-10-21 NOTE — Anesthesia Preprocedure Evaluation (Addendum)
Anesthesia Evaluation  Patient identified by MRN, date of birth, ID band Patient unresponsive    Reviewed: Allergy & Precautions, Patient's Chart, lab work & pertinent test results  Airway Mallampati: Intubated       Dental   Pulmonary COPD, Recent URI , Current Smoker,  Lung Ca   Pulmonary exam normal        Cardiovascular hypertension, + CAD and +CHF   Rhythm:Regular Rate:Tachycardia   1. The left ventricle has a visually estimated ejection fraction of 15%. The cavity size was moderately dilated. Left ventricular diastolic Doppler parameters are consistent with impaired relaxation.  2. The right ventricle has severely reduced systolic function. The cavity was normal. There is no increase in right ventricular wall thickness.  3. Trivial pericardial effusion is present.  4. The mitral valve is abnormal. Mild thickening of the mitral valve leaflet. There is mild mitral annular calcification present.  5. The aortic valve is tricuspid. Mild thickening of the aortic valve.    Neuro/Psych CVA    GI/Hepatic negative GI ROS, Neg liver ROS,   Endo/Other  negative endocrine ROS  Renal/GU negative Renal ROS     Musculoskeletal negative musculoskeletal ROS (+)   Abdominal   Peds  Hematology negative hematology ROS (+)   Anesthesia Other Findings Day of surgery medications reviewed with the patient.  Reproductive/Obstetrics                            Anesthesia Physical Anesthesia Plan  ASA: IV and emergent  Anesthesia Plan: General   Post-op Pain Management:    Induction:   PONV Risk Score and Plan: 1 and Ondansetron  Airway Management Planned: Oral ETT  Additional Equipment:   Intra-op Plan:   Post-operative Plan: Post-operative intubation/ventilation  Informed Consent: I have reviewed the patients History and Physical, chart, labs and discussed the procedure including the risks,  benefits and alternatives for the proposed anesthesia with the patient or authorized representative who has indicated his/her understanding and acceptance.       Plan Discussed with: CRNA and Anesthesiologist  Anesthesia Plan Comments:        Anesthesia Quick Evaluation

## 2018-10-21 NOTE — ED Triage Notes (Addendum)
Per EMS called out by family at home for unconscious episode/AMS. Pt was normal, then went into bathroom and collapsed in the bathroom at 1620. Now pt A/O x 2, garbled speech and not oriented to event, not following most commands, had episode of incontinence. No hx of seizure. Pt tachypnic.

## 2018-10-22 ENCOUNTER — Telehealth: Payer: Medicare HMO | Admitting: Cardiovascular Disease

## 2018-10-22 ENCOUNTER — Inpatient Hospital Stay (HOSPITAL_COMMUNITY): Payer: Medicare HMO

## 2018-10-22 ENCOUNTER — Inpatient Hospital Stay: Payer: Self-pay

## 2018-10-22 ENCOUNTER — Encounter (HOSPITAL_COMMUNITY): Payer: Self-pay | Admitting: Interventional Radiology

## 2018-10-22 DIAGNOSIS — J90 Pleural effusion, not elsewhere classified: Secondary | ICD-10-CM

## 2018-10-22 DIAGNOSIS — J9600 Acute respiratory failure, unspecified whether with hypoxia or hypercapnia: Secondary | ICD-10-CM

## 2018-10-22 DIAGNOSIS — I639 Cerebral infarction, unspecified: Secondary | ICD-10-CM

## 2018-10-22 LAB — PHOSPHORUS: Phosphorus: 3.2 mg/dL (ref 2.5–4.6)

## 2018-10-22 LAB — CBC WITH DIFFERENTIAL/PLATELET
Abs Immature Granulocytes: 0.03 10*3/uL (ref 0.00–0.07)
Basophils Absolute: 0 10*3/uL (ref 0.0–0.1)
Basophils Relative: 0 %
Eosinophils Absolute: 0.1 10*3/uL (ref 0.0–0.5)
Eosinophils Relative: 1 %
HCT: 42.4 % (ref 39.0–52.0)
Hemoglobin: 14.5 g/dL (ref 13.0–17.0)
Immature Granulocytes: 0 %
Lymphocytes Relative: 15 %
Lymphs Abs: 1.4 10*3/uL (ref 0.7–4.0)
MCH: 29.5 pg (ref 26.0–34.0)
MCHC: 34.2 g/dL (ref 30.0–36.0)
MCV: 86.2 fL (ref 80.0–100.0)
Monocytes Absolute: 0.6 10*3/uL (ref 0.1–1.0)
Monocytes Relative: 7 %
Neutro Abs: 7 10*3/uL (ref 1.7–7.7)
Neutrophils Relative %: 77 %
Platelets: 352 10*3/uL (ref 150–400)
RBC: 4.92 MIL/uL (ref 4.22–5.81)
RDW: 15.2 % (ref 11.5–15.5)
WBC: 9.1 10*3/uL (ref 4.0–10.5)
nRBC: 0 % (ref 0.0–0.2)

## 2018-10-22 LAB — HIV ANTIBODY (ROUTINE TESTING W REFLEX): HIV Screen 4th Generation wRfx: NONREACTIVE

## 2018-10-22 LAB — BASIC METABOLIC PANEL
Anion gap: 13 (ref 5–15)
BUN: 12 mg/dL (ref 8–23)
CO2: 22 mmol/L (ref 22–32)
Calcium: 9 mg/dL (ref 8.9–10.3)
Chloride: 108 mmol/L (ref 98–111)
Creatinine, Ser: 0.97 mg/dL (ref 0.61–1.24)
GFR calc Af Amer: >60 mL/min (ref >60)
GFR calc non Af Amer: >60 mL/min (ref >60)
Glucose, Bld: 92 mg/dL (ref 70–99)
Potassium: 3.6 mmol/L (ref 3.5–5.1)
Sodium: 143 mmol/L (ref 135–145)

## 2018-10-22 LAB — TROPONIN I
Troponin I: 0.15 ng/mL (ref <0.03)
Troponin I: 0.18 ng/mL (ref <0.03)
Troponin I: 0.11 ng/mL (ref <0.03)

## 2018-10-22 LAB — LIPID PANEL
Cholesterol: 162 mg/dL (ref 0–200)
HDL: 51 mg/dL (ref >40)
LDL Cholesterol: 94 mg/dL (ref 0–99)
Total CHOL/HDL Ratio: 3.2 RATIO
Triglycerides: 85 mg/dL (ref <150)
VLDL: 17 mg/dL (ref 0–40)

## 2018-10-22 LAB — HEMOGLOBIN A1C
Hgb A1c MFr Bld: 5.7 % — ABNORMAL HIGH (ref 4.8–5.6)
Mean Plasma Glucose: 116.89 mg/dL

## 2018-10-22 LAB — TRIGLYCERIDES: Triglycerides: 81 mg/dL (ref <150)

## 2018-10-22 LAB — GLUCOSE, CAPILLARY: Glucose-Capillary: 93 mg/dL (ref 70–99)

## 2018-10-22 LAB — MAGNESIUM: Magnesium: 1.7 mg/dL (ref 1.7–2.4)

## 2018-10-22 LAB — MRSA PCR SCREENING: MRSA by PCR: NEGATIVE

## 2018-10-22 MED ORDER — SODIUM CHLORIDE 0.9% FLUSH
10.0000 mL | INTRAVENOUS | Status: DC | PRN
  Administered 2018-10-25: 10 mL
  Administered 2018-10-28: 20 mL
  Filled 2018-10-22 (×2): qty 40

## 2018-10-22 MED ORDER — SODIUM CHLORIDE 0.9 % IV BOLUS
500.0000 mL | Freq: Once | INTRAVENOUS | Status: AC
  Administered 2018-10-22: 500 mL via INTRAVENOUS

## 2018-10-22 MED ORDER — PHENYLEPHRINE HCL-NACL 10-0.9 MG/250ML-% IV SOLN
0.0000 ug/min | INTRAVENOUS | Status: DC
  Administered 2018-10-22: 120 ug/min via INTRAVENOUS
  Administered 2018-10-22 (×2): 75 ug/min via INTRAVENOUS
  Administered 2018-10-22: 140 ug/min via INTRAVENOUS
  Administered 2018-10-22: 150 ug/min via INTRAVENOUS
  Administered 2018-10-22: 10 ug/min via INTRAVENOUS
  Administered 2018-10-22: 125 ug/min via INTRAVENOUS
  Administered 2018-10-22: 130 ug/min via INTRAVENOUS
  Administered 2018-10-22 (×2): 125 ug/min via INTRAVENOUS
  Administered 2018-10-22: 130 ug/min via INTRAVENOUS
  Filled 2018-10-22 (×2): qty 250
  Filled 2018-10-22: qty 500
  Filled 2018-10-22 (×3): qty 250
  Filled 2018-10-22: qty 500
  Filled 2018-10-22 (×2): qty 250
  Filled 2018-10-22: qty 500

## 2018-10-22 MED ORDER — SODIUM CHLORIDE 0.9% FLUSH
10.0000 mL | Freq: Two times a day (BID) | INTRAVENOUS | Status: DC
  Administered 2018-10-22: 20 mL
  Administered 2018-10-23: 10 mL
  Administered 2018-10-23: 20 mL
  Administered 2018-10-24 – 2018-10-29 (×11): 10 mL

## 2018-10-22 MED ORDER — ASPIRIN 300 MG RE SUPP: 300.0000 mg | Freq: Every day | RECTAL | Status: DC

## 2018-10-22 MED ORDER — CHLORHEXIDINE GLUCONATE CLOTH 2 % EX PADS
6.0000 | MEDICATED_PAD | Freq: Every day | CUTANEOUS | Status: DC
  Administered 2018-10-23 – 2018-10-28 (×3): 6 via TOPICAL

## 2018-10-22 MED ORDER — PHENYLEPHRINE HCL-NACL 40-0.9 MG/250ML-% IV SOLN
0.0000 ug/min | INTRAVENOUS | Status: DC
  Administered 2018-10-22: 20 ug/min via INTRAVENOUS
  Administered 2018-10-23: 100 ug/min via INTRAVENOUS
  Administered 2018-10-23: 160 ug/min via INTRAVENOUS
  Filled 2018-10-22 (×3): qty 250

## 2018-10-22 MED ORDER — ASPIRIN 300 MG RE SUPP
300.0000 mg | Freq: Every day | RECTAL | Status: DC
  Administered 2018-10-22 – 2018-10-23 (×2): 300 mg via RECTAL
  Filled 2018-10-22 (×2): qty 1

## 2018-10-22 NOTE — Progress Notes (Signed)
Pt intubated and under the care of anesthesia, unable to assess NIH

## 2018-10-22 NOTE — Progress Notes (Signed)
Patient ID: Leveon Pelzer, male   DOB: Jun 10, 1950, 68 y.o.   MRN: [id number]   IR procedure 5/25 pm: Bilateral common carotid arteriograms and RT VA arteriogram followed by revascularization of occluded Rt MCA M 1 seg with x 2 passes with the 27mm x 41mm embotrap retriever  device achieving a TICI 2b revascularization.  Spoke to [employer] Still intubated Moving all 4s purposefully  Rt groin sheath pulled today No issues- no hematoma Rt foot 2+ pulses VSS  Will follow Will inform Dr Estanislado Pandy of status

## 2018-10-22 NOTE — Progress Notes (Signed)
[place] Progress Note Patient Name: Jared Stevens DOB: July 02, 1950 MRN: [id number]   Date of Service  10/22/2018  HPI/Events of Note  Hypotension - Currently on a Phenylephrine IV infusion. Request to make infusion quad strength.   eICU Interventions  Will make Phenylephrine IV infusion quad strength.      Intervention Category Major Interventions: Hypotension - evaluation and management  Patterson Hollenbaugh Eugene 10/22/2018, 7:24 PM

## 2018-10-22 NOTE — Progress Notes (Signed)
Tuesday 10/22/18  7Fr femoral arterial sheath removed at 1135 am.  Multiple kinks noted in sheath, however, sheath aspirated and flushed well.  Manual pressure using hemostasis pad for 25 min. Hemostasis obtained at 1200pm.  tegaderm dressing applied.  RN site reviewed with [facility]. Distal pulses intact.  No hematoma.

## 2018-10-22 NOTE — Progress Notes (Signed)
Phone and bedside report given to 4N RN

## 2018-10-22 NOTE — Progress Notes (Signed)
0935- Patient noted to be sitting up while throwing up. Patient laid back immediately, groin site assessed, bleeding noted, pressure applied. Pressure applied until 0949. Site remains a level 0, blood pressure remains stable. Will continue to monitor. Lianne Bushy RN BSN.

## 2018-10-22 NOTE — Progress Notes (Signed)
Peripherally Inserted Central Catheter/Midline Placement  The IV Nurse has discussed with the patient and/or persons authorized to consent for the patient, the purpose of this procedure and the potential benefits and risks involved with this procedure.  The benefits include less needle sticks, lab draws from the catheter, and the patient may be discharged home with the catheter. Risks include, but not limited to, infection, bleeding, blood clot (thrombus formation), and puncture of an artery; nerve damage and irregular heartbeat and possibility to perform a PICC exchange if needed/ordered by physician.  Alternatives to this procedure were also discussed.  Bard Power PICC patient education guide, fact sheet on infection prevention and patient information card has been provided to patient /or left at bedside.    PICC/Midline Placement Documentation  PICC Double Lumen 76/72/09 PICC Left Basilic 43 cm 0 cm (Active)  Indication for Insertion or Continuance of Line Vasoactive infusions 10/22/2018  7:03 PM  Exposed Catheter (cm) 0 cm 10/22/2018  7:03 PM  Site Assessment Clean;Dry;Intact 10/22/2018  7:03 PM  Lumen #1 Status Flushed;Blood return noted;Saline locked 10/22/2018  7:03 PM  Lumen #2 Status Flushed;Blood return noted;Saline locked 10/22/2018  7:03 PM  Dressing Type Transparent 10/22/2018  7:03 PM  Dressing Status Clean;Intact;Dry;Antimicrobial disc in place 10/22/2018  7:03 PM  Dressing Change Due 10/29/18 10/22/2018  7:03 PM       Enos Fling 10/22/2018, 7:06 PM

## 2018-10-22 NOTE — Progress Notes (Signed)
OT Cancellation Note  Patient Details Name: Jared Stevens MRN: [id number] DOB: 1950/10/13   Cancelled Treatment:    Reason Eval/Treat Not Completed: Medical issues which prohibited therapy(intubated and sedated) OT to check back at a more appropriate time.   Richelle Ito, OTR/L  Acute Rehabilitation Services Pager: [phone] Office: [phone] .  10/22/2018, 9:02 AM

## 2018-10-22 NOTE — Progress Notes (Signed)
Unable to assess NIH as pt is intubated and under the care of anesthesia

## 2018-10-22 NOTE — Progress Notes (Signed)
PT Cancellation Note  Patient Details Name: Reegan Bouffard MRN: [id number] DOB: 1951-03-11   Cancelled Treatment:    Reason Eval/Treat Not Completed: Patient not medically ready Pt intubated and sedated. Will follow.   Marguarite Arbour A Juni Glaab 10/22/2018, 8:39 AM Wray Kearns, PT, DPT Acute Rehabilitation Services Pager [phone] Office [phone]

## 2018-10-22 NOTE — Progress Notes (Signed)
SLP Cancellation Note  Patient Details Name: Jared Stevens MRN: [id number] DOB: 10/13/1950   Cancelled treatment:       Reason Eval/Treat Not Completed: Patient not medically ready;Medical issues which prohibited therapy(Pt is currently intubated. SLP will follow up. )  Gorje Iyer I. Hardin Negus, [place], [place] Office number [phone] Pager [phone]  Horton Marshall 10/22/2018, 7:54 AM

## 2018-10-22 NOTE — Progress Notes (Signed)
Assisted tele visit to patient with daughter.  Margues Filippini, Cindra Presume, RN

## 2018-10-22 NOTE — Progress Notes (Signed)
Stroke Team Progress Note  SUBJECTIVE The patient presented on 10/21/2018 evening with right M1 occlusion and left hemiplegia left neglect and gaze deviation and was treated with IV TPA followed by successful mechanical thrombectomy.  He remains sedated and intubated but is now able to move the left side.  Blood pressure has been adequately controlled.  He is following commands   OBJECTIVE Most recent Vital Signs: Temp: 100.4 F (38 C) (05/26 0800) Temp Source: Axillary (05/26 0800) BP: 119/87 (05/26 1030) Pulse Rate: 51 (05/26 1030) Respiratory Rate: 15 O2 Saturdation: 96%  CBG (last 3)  Recent Labs    10/21/18 1726 10/21/18 2344 10/22/18 0431  GLUCAP 78 73 93       Studies:  CTA head and neck reveals right M1 and M2 occlusion with acute thrombus in the distal right M1 segment extending into the M2 branches bilaterally. This is occlusive with minimal collateral flow and poor opacification of right MCA branches. 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 46 mL  MRI pending   ECHO 09/27/18 revealed estimated left ventricular ejection fraction of 15%. The cavity size was moderately dilated. Left ventricular diastolic Doppler parameters are consistent with impaired relaxation. The right ventricle had severely reduced systolic function. The cavity was normal. There is no increase in right ventricular wall thickness. Trivial pericardial effusion was present. LDL 94 mg percent HbA1c 5.7  Physical Exam:    Frail middle-aged African-American male who is sedated and intubated and not in distress. . Afebrile. Head is nontraumatic. Neck is supple without bruit.    Cardiac exam no murmur or gallop. Lungs are clear to auscultation. Distal pulses are well felt. Neurological Exam :  Patient is intubated and sedated but can be easily aroused.  He has right gaze preference and he can look to the left past midline but not all the way.  He blinks to threat on the right but not consistently on the left.   Pupils are equal reactive.  Fundi could not be visualized.  He has left lower facial weakness.  Tongue midline.  He has left hemiparesis but is able to move left upper and lower extremity with gravity eliminated.  He has purposeful antigravity movements on the right side.  Response to pain on either side but more on the right than the left.  Right plantars downgoing left is equivocal. ASSESSMENT Jared Stevens is a 68 y.o. 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   Recommend discontinue arterial groin sheath and when patient can be mobilized wean off ventilatory support team when ready.  Maintain strict control of blood pressure as per post TPA and interventional stroke protocol.  Check MRI scan of the brain later today.  Discussed with Dr. Elsworth Soho pulmonary critical care medicine.  I also spoke to the patient's daughter over the phone and gave her an update about his condition and answered questions.  Treatment of his adenocarcinoma lung as per pulmonary critical care  team. This patient is critically ill and at significant risk of neurological worsening, death and care requires constant monitoring of vital signs, hemodynamics,respiratory and cardiac monitoring, extensive review of multiple databases, frequent neurological assessment, discussion with family, other specialists and  medical decision making of high complexity.I have made any additions or clarifications directly to the above note.This critical care time does not reflect procedure time, or teaching time or supervisory time of PA/NP/Med Resident etc but could involve care discussion time.  I spent 40 minutes of neurocritical care time  in the care of  this patient.       Antony Contras, MD Medical Director [facility] Pager: [phone] 10/22/2018 1:08 PM

## 2018-10-22 NOTE — Progress Notes (Signed)
NAME:  Arber Wiemers, MRN:  [id number], DOB:  03-29-1951, LOS: 1 ADMISSION DATE:  10/21/2018, CONSULTATION DATE:  10/21/18 REFERRING MD:  Estanislado Pandy  CHIEF COMPLAINT:  Left sided weakness   Brief History   Michaelanthony Kempton is a 68 y.o. male who was admitted 5/25 with R MCA infarct s/p tPA administration and VIR revascularization. Post op, transferred to ICU where he remained on the ventilator.  History of present illness   Pt is encephelopathic; therefore, this HPI is obtained from chart review. Chukwudi Ewen is a 68 y.o. male who has a PMH including but not limited to stage III NSCLC (adenocarcinoma) s/p chemo / XRT 2018 and followed by Dr. Earlie Server   He presented to [facility] 5/25 with left sided weakness and hemineglect.  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  He was deemed to be a tPA candidate which was administered at [place].  He was then taken to VIR for revascularization of occluded RMCA.  Post operatively, he was transferred to the ICU and PCCM was asked to see in consultation for vent management.  Past Medical History  Stage III NSCLC (adenocarcinoma) s/p chemoradiation 2018, COPD, HLD. Chronic right effusion with negative cytology 5/4 Ischemic cardiomyopathy with EF 15%  Significant Hospital Events   5/25 > admit.  Consults:  PCCM. IR.  Procedures:  ETT 5/25 >  R fem sheath 5/25 >   Significant Diagnostic Tests:  CT Head 5/25 > hyperdense R MCA, aspects 10. CTA / CTP head 5/25 > R M1 thrombus extending into M2 branches, R MCA infarct with 11ml core infarct and 127ml penumbra. CT head 5/26 >  MRI brain 5/26 >  Echo 5/26 >   Micro Data:  SARS CoV 2 5/25 > negative.  Antimicrobials:  None.   Interim history/subjective:    Critically ill, intubated Agitation with breakthrough on Precedex drip Mild bleeding from right groin sheath Low-grade febrile Good urine output   Objective:  Blood pressure (!) 129/109, pulse (!) 107, temperature  (!) 100.4 F (38 C), temperature source Axillary, resp. rate 17, height 6\' 1"  (1.854 m), weight 60.3 kg, SpO2 97 %.    Vent Mode: PRVC FiO2 (%):  [40 %] 40 % Set Rate:  [15 bmp] 15 bmp Vt Set:  [640 mL] 640 mL PEEP:  [5 cmH20] 5 cmH20 Plateau Pressure:  [16 cmH20-21 cmH20] 16 cmH20   Intake/Output Summary (Last 24 hours) at 10/22/2018 0956 Last data filed at 10/22/2018 0900 Gross per 24 hour  Intake 2166.36 ml  Output 3325 ml  Net -1158.64 ml   Filed Weights   10/21/18 1730  Weight: 60.3 kg    Examination: General: Chronically ill appearing male, appears older than stated age, in NAD. Neuro: Sedated, unresponsive. HEENT: [place]/AT. Sclerae anicteric.  ETT in place. Cardiovascular: RRR, no M/R/G.  Lungs: Decreased breath sounds right, no accessory muscle use Abdomen: BS x 4, soft, NT/ND.  Musculoskeletal: No gross deformities, no edema.  Skin: Intact, warm, no rashes. Pulses good Mild bleeding right groin sheath  X-ray personally reviewed which shows chronic right effusion  Assessment & Plan:   R MCA occlusion - s/p VIR revascularization 5/25. - Post op management per IR. - Stroke workup / management per neuro. -Now on phenylephrine to maintain systolic blood pressure 1 20-1 40 -Propofol for sedation, goal RA SS -1  Acute respiratory insufficiency - due to inability to protect the airway in the setting of above. Hx COPD per report (no  PFT's in Epic). -  Daily SBT -defer extubation for 24 hours wait for removal of sheath and repeat imaging. - Bronchial hygiene.   Elevated troponin - EKG normal. 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 - Hold preadmission ASA until 24 hrs after tPA. - Continue preadmission lipitor.   Hx stage III NSCLC (adenocarcinoma) s/p chemo / XRT in 2018 (followed by Dr. Earlie Server). - Outpatient f/u  with oncology.  Hx right pleural effusion now with recurrence - s/p thoracentesis 09/27/18 with 1.7L clear yellow fluid removed.  Transudative fluid. - Lasix 20mg  BID starting in AM (responded well to this dose ) - Consider thora 5/27  if no improvement    Best Practice:  Diet: NPO. Pain/Anxiety/Delirium protocol (if indicated): Propofol gtt / Fentnanyl PRN / Midazolam PRN.  RASS goal 0 to -1. VAP protocol (if indicated): In place. DVT prophylaxis: SCD's. GI prophylaxis: Pantoprazole. Glucose control: None. Mobility: Bedrest. Code Status: Full. Family Communication: per stroke service Disposition: ICU.    The patient is critically ill with multiple organ systems failure and requires high complexity decision making for assessment and support, frequent evaluation and titration of therapies, application of advanced monitoring technologies and extensive interpretation of multiple databases. Critical Care Time devoted to patient care services described in this note independent of APP/resident  time is 35 minutes.   Kara Mead MD. Shade Flood. [place] [facility] Pager [phone] If no response call [phone]    10/22/2018, 9:56 AM

## 2018-10-22 NOTE — Progress Notes (Signed)
Unable to assess NIH as pt is intubated and under the care of anesthesia at this time

## 2018-10-22 NOTE — Progress Notes (Signed)
Dr. Leonie Man at bedside with patients blood pressure below parameters. Unable to increase pressor r/t line access. Paged IV team for time of placement of PICC with no call back. Order for 500 cc bolus given. Will carry out. Lianne Bushy RN BSN.

## 2018-10-22 NOTE — Progress Notes (Signed)
eLink Physician-Brief Progress Note Patient Name: Jared Stevens DOB: 01/14/1951 MRN: [id number]   Date of Service  10/22/2018  HPI/Events of Note  Hypotension - SBP = 105-110. Goal SBP = 120-140.  eICU Interventions  Will order: 1. Phenylephrine IV infusion. Titrate to SBP = 120-140.     Intervention Category Major Interventions: Hypotension - evaluation and management  Anniece Bleiler Eugene 10/22/2018, 12:01 AM

## 2018-10-23 ENCOUNTER — Inpatient Hospital Stay (HOSPITAL_COMMUNITY): Payer: Medicare HMO

## 2018-10-23 ENCOUNTER — Encounter (HOSPITAL_COMMUNITY): Payer: Self-pay | Admitting: Interventional Radiology

## 2018-10-23 DIAGNOSIS — R0689 Other abnormalities of breathing: Secondary | ICD-10-CM

## 2018-10-23 DIAGNOSIS — I6601 Occlusion and stenosis of right middle cerebral artery: Secondary | ICD-10-CM

## 2018-10-23 LAB — BASIC METABOLIC PANEL
Anion gap: 10 (ref 5–15)
BUN: 13 mg/dL (ref 8–23)
CO2: 24 mmol/L (ref 22–32)
Calcium: 8.5 mg/dL — ABNORMAL LOW (ref 8.9–10.3)
Chloride: 111 mmol/L (ref 98–111)
Creatinine, Ser: 1.09 mg/dL (ref 0.61–1.24)
GFR calc Af Amer: >60 mL/min (ref >60)
GFR calc non Af Amer: >60 mL/min (ref >60)
Glucose, Bld: 83 mg/dL (ref 70–99)
Potassium: 3.4 mmol/L — ABNORMAL LOW (ref 3.5–5.1)
Sodium: 145 mmol/L (ref 135–145)

## 2018-10-23 LAB — CBC
HCT: 42.8 % (ref 39.0–52.0)
Hemoglobin: 14.5 g/dL (ref 13.0–17.0)
MCH: 29.5 pg (ref 26.0–34.0)
MCHC: 33.9 g/dL (ref 30.0–36.0)
MCV: 87.2 fL (ref 80.0–100.0)
Platelets: 325 10*3/uL (ref 150–400)
RBC: 4.91 MIL/uL (ref 4.22–5.81)
RDW: 15.4 % (ref 11.5–15.5)
WBC: 9.7 10*3/uL (ref 4.0–10.5)
nRBC: 0 % (ref 0.0–0.2)

## 2018-10-23 LAB — TRIGLYCERIDES: Triglycerides: 118 mg/dL (ref <150)

## 2018-10-23 LAB — MAGNESIUM: Magnesium: 1.7 mg/dL (ref 1.7–2.4)

## 2018-10-23 MED ORDER — PANTOPRAZOLE SODIUM 40 MG PO PACK: 40.0000 mg | PACK | Freq: Every day | ORAL | Status: DC

## 2018-10-23 MED ORDER — CLEVIDIPINE BUTYRATE 0.5 MG/ML IV EMUL: 0.0000 mg/h | INTRAVENOUS | Status: DC

## 2018-10-23 MED ORDER — POTASSIUM CHLORIDE 20 MEQ/15ML (10%) PO SOLN
40.0000 meq | Freq: Every day | ORAL | Status: DC
  Administered 2018-10-23: 40 meq
  Filled 2018-10-23: qty 30

## 2018-10-23 MED ORDER — PANTOPRAZOLE SODIUM 40 MG PO TBEC
40.0000 mg | DELAYED_RELEASE_TABLET | Freq: Every day | ORAL | Status: DC
  Administered 2018-10-23 – 2018-10-28 (×6): 40 mg via ORAL
  Filled 2018-10-23 (×6): qty 1

## 2018-10-23 NOTE — Progress Notes (Signed)
SLP Cancellation Note  Patient Details Name: Jared Stevens MRN: [id number] DOB: [birth year]   Cancelled treatment:       Reason Eval/Treat Not Completed: (Pt working with PT/OT) will provide assessment at next available time   [name] Rutherford Nail 10/23/2018, 4:02 PM

## 2018-10-23 NOTE — Progress Notes (Signed)
Stroke Team Progress Note  SUBJECTIVE    Jared Stevens remains  intubated and critically ill but is more alert today but is now able to move the left side.  Blood pressure has been adequately controlled.  Jared Stevens is following commands.  MRI scan of the brain done yesterday shows patchy right MCA infarct.   OBJECTIVE Most recent Vital Signs: Temp: 97.8 F (36.6 C) (05/27 1141) Temp Source: Axillary (05/27 1141) BP: 118/70 (05/27 1500) Pulse Rate: 105 (05/27 1500) Respiratory Rate: 17 O2 Saturdation: 95%  CBG (last 3)  Recent Labs    10/21/18 1726 10/21/18 2344 10/22/18 0431  GLUCAP 78 73 93       Studies:  CTA head and neck reveals right M1 and M2 occlusion with acute thrombus in the distal right M1 segment extending into the M2 branches bilaterally. This is occlusive with minimal collateral flow and poor opacification of right MCA branches. 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 46 mL  MRI pending   ECHO 09/27/18 revealed estimated left ventricular ejection fraction of 15%. The cavity size was moderately dilated. Left ventricular diastolic Doppler parameters are consistent with impaired relaxation. The right ventricle had severely reduced systolic function. The cavity was normal. There is no increase in right ventricular wall thickness. Trivial pericardial effusion was present. LDL 94 mg percent HbA1c 5.7 MRI scan of the brain :  Acute patchy right MCA infarct involving right temporal lobe, right insula, basal ganglia and lateral frontal lobe.  Old encephalomalacia in anterior frontal lobe likely from posttraumatic etiology Physical Exam:    Frail middle-aged African-American male who is   intubated and not in distress. . Afebrile. Head is nontraumatic. Neck is supple without bruit.    Cardiac exam no murmur or gallop. Lungs are clear to auscultation. Distal pulses are well felt. Neurological Exam : Patient is intubated .Jared Stevens is drowsy  but can be easily aroused.  Jared Stevens has right gaze preference and  Jared Stevens can look to the left past midline but not all the way.  Jared Stevens blinks to threat on the right but not consistently on the left.  Pupils are equal reactive.  Fundi could not be visualized.  Jared Stevens has left lower facial weakness.  Tongue midline.  Jared Stevens has left hemiparesis but is able to move left upper and lower extremity with gravity eliminated.  Jared Stevens has purposeful antigravity movements on the right side.  Response to pain on either side but more on the right than the left.  Right plantars downgoing left is equivocal. ASSESSMENT Jared Stevens is a 68 y.o. 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  Etiology of cardiomyopathy most likely chemotherapy related from recent treatment for adenocarcinoma lung    Hospital day # 2  TREATMENT/PLAN    Recommend wean off phenylephrine and change blood pressure goals to systolic below 675 maintain strict control of blood pressure as per post TPA and interventional stroke protocol.     Discussed with Dr. Elsworth Soho pulmonary critical care medicine.  I also spoke to the patient's daughter over the phone and gave her an update about his condition and answered questions.  Treatment of his adenocarcinoma lung as per pulmonary critical care team. This patient is critically ill and at significant risk of neurological worsening, death and care requires constant monitoring of vital signs, hemodynamics,respiratory and cardiac monitoring, extensive  review of multiple databases, frequent neurological assessment, discussion with family, other specialists and medical decision making of high complexity.I have made any additions or clarifications directly to the above note.This critical care time does not reflect procedure time, or teaching time or supervisory time of PA/NP/Med Resident etc but could  involve care discussion time.  I spent 30 minutes of neurocritical care time  in the care of  this patient.       Antony Contras, MD Medical Director [facility] Pager: [phone] 10/23/2018 3:52 PM

## 2018-10-23 NOTE — Progress Notes (Signed)
Rehab Admissions Coordinator Note:  Patient was screened by Cleatrice Burke for appropriateness for an Inpatient Acute Rehab Consult per PT and OT recs s/p extubation. At this time, we are recommending Inpatient Rehab consult.  Cleatrice Burke RN MSN 10/23/2018, 6:29 PM  I can be reached at [phone].

## 2018-10-23 NOTE — Procedures (Signed)
Extubation Procedure Note  Patient Details:   Name: Jared Stevens DOB: 1950/12/14 MRN: [id number]   Airway Documentation:    Vent end date: 10/23/18 Vent end time: 1104   Evaluation  O2 sats: stable throughout Complications: No apparent complications Patient did tolerate procedure well. Bilateral Breath Sounds: Diminished  Patient able to speak  Yes  Rudene Re 10/23/2018, 11:05 AM

## 2018-10-23 NOTE — Progress Notes (Signed)
Patient ID: Jared Stevens, male   DOB: [birth year], 68 y.o.   MRN: [id number]    Called by RN to evaluate Rt groin of pt When she removed groin site bandage-- noted small lump at arteriogram site  Small 1x1 cm palpable lump in groin site NT No bleeding No hematoma Lump moves with palpation Rt foot 1+ pulses  Discussed with Dr Estanislado Pandy  Will get [place] to be sure no pseudoanerysm

## 2018-10-23 NOTE — Progress Notes (Signed)
[place] PA to notify of small lump at right groin site. Noted when surgical bandage was removed.

## 2018-10-23 NOTE — Progress Notes (Signed)
OT Cancellation Note  Patient Details Name: Ramona Slinger MRN: [id number] DOB: 11-Feb-1951   Cancelled Treatment:    Reason Eval/Treat Not Completed: Active bedrest order;Patient not medically ready, possible extubation this morning. Will follow and initiate OT eval as medically appropriate and able.    Delight Stare, OT Acute Rehabilitation Services Pager [phone] Office [phone]   Delight Stare 10/23/2018, 10:48 AM

## 2018-10-23 NOTE — Progress Notes (Signed)
Referring Physician(s): Dr Royal Hawthorn  Supervising Physician: Luanne Bras  Patient Status:  Jared Stevens - In-pt  Chief Complaint:  IR procedure 5/25 pm: Bilateral common carotid arteriograms and RT VA arteriogram followed by revascularization of occluded Rt MCA M 1 seg with x 2 passes with the 24mm x 13mm embotrap retriever device achieving a TICI 2b revascularization.  Subjective:  No real change in status   Allergies: Patient has no known allergies.  Medications: Prior to Admission medications   Medication Sig Start Date End Date Taking? Authorizing Provider  Albuterol Sulfate 2.5 MG/0.5ML NEBU Take 1 each by nebulization every 6 (six) hours as needed (for wheezing).  09/17/18  Yes [provider]  amitriptyline (ELAVIL) 50 MG tablet Take 50 mg by mouth at bedtime. 09/17/18  Yes [provider]  aspirin 81 MG chewable tablet Chew 1 tablet (81 mg total) by mouth daily. 10/03/18  Yes Rai, Ripudeep K, MD  carvedilol (COREG) 3.125 MG tablet Take 1 tablet (3.125 mg total) by mouth 2 (two) times daily with a meal. 10/02/18  Yes Rai, Ripudeep K, MD  diazepam (VALIUM) 5 MG tablet Take 5 mg by mouth every 4 (four) hours as needed for pain. Back pain 10/02/18  Yes [provider]  ibuprofen (ADVIL) 800 MG tablet Take 800 mg by mouth 3 (three) times daily as needed (pain).  10/13/18  Yes [provider]  ipratropium (ATROVENT HFA) 17 MCG/ACT inhaler    Yes [provider]  latanoprost (XALATAN) 0.005 % ophthalmic solution Place 1 drop into both eyes 2 (two) times daily.  03/12/18  Yes [provider]  mometasone (ASMANEX) 220 MCG/INH inhaler    Yes [provider]  traMADol (ULTRAM) 50 MG tablet Take 1 tablet (50 mg total) by mouth every 8 (eight) hours as needed. 10/02/18 10/02/19 Yes Rai, Ripudeep K, MD  VENTOLIN HFA 108 (90 Base) MCG/ACT inhaler Inhale 1 puff into the lungs every 6 (six) hours as needed for wheezing or shortness of  breath.  04/11/18  Yes [provider]     Vital Signs: BP (!) 122/91    Pulse 95    Temp 98 F (36.7 C) (Axillary)    Resp 15    Ht 6\' 1"  (1.854 m)    Wt 133 lb (60.3 kg)    SpO2 100%    BMI 17.55 kg/m   Physical Exam Vitals signs reviewed.  Neurological:     Comments: Moving all extremities - not to command Rt foot 1+ pulses   Rt groin site is clean and dry No bleeding No hematoma   Imaging: Ct Code Stroke Cta Head W/wo Contrast  Result Date: 10/21/2018 CLINICAL DATA:  Stroke.  Left-sided weakness. EXAM: CT ANGIOGRAPHY HEAD AND NECK CT PERFUSION BRAIN TECHNIQUE: Multidetector CT imaging of the head and neck was performed using the standard protocol during bolus administration of intravenous contrast. Multiplanar CT image reconstructions and MIPs were obtained to evaluate the vascular anatomy. Carotid stenosis measurements (when applicable) are obtained utilizing NASCET criteria, using the distal internal carotid diameter as the denominator. Multiphase CT imaging of the brain was performed following IV bolus contrast injection. Subsequent parametric perfusion maps were calculated using RAPID software. CONTRAST:  134mL OMNIPAQUE IOHEXOL 350 MG/ML SOLN COMPARISON:  CT head 10/21/2018 FINDINGS: CTA NECK FINDINGS Aortic arch: Standard branching. Proximal great vessels widely patent. Decreased attenuation descending thoracic aorta likely due to beam attenuation from a large right pleural effusion. Ascending aorta normal in caliber and  opacification. Right carotid system: Right carotid system widely patent without stenosis. Mild atherosclerotic calcification right internal carotid artery below the skull base. Carotid bifurcation normal Left carotid system: Left carotid bifurcation widely patent without stenosis. No significant atherosclerotic disease. Vertebral arteries: Both vertebral arteries widely patent to the basilar without stenosis. Skeleton: Cervical spine spondylosis.  No acute  bony abnormality Other neck: Negative for mass or adenopathy in the neck. Upper chest: Large right pleural effusion. Patient has a history of lung cancer. Review of the MIP images confirms the above findings CTA HEAD FINDINGS Anterior circulation: Cavernous carotid widely patent bilaterally with mild atherosclerotic calcification. Right M1 segment patent proximally. There is thrombus in the distal right M1 segment. There is occlusive thrombus in right M2 branches with poor collateral flow. Minimal opacification of right middle cerebral artery branches. Azygos anterior cerebral artery which supplies both pericallosal arteries without stenosis. Left middle cerebral artery widely patent. Posterior circulation: Both vertebral arteries patent to the basilar. PICA patent bilaterally. Basilar widely patent. Superior cerebellar and posterior cerebral arteries patent bilaterally. Fetal origin right posterior cerebral artery. Venous sinuses: Minimal venous contrast due to arterial phase scanning Anatomic variants: None Delayed phase: Not performed Review of the MIP images confirms the above findings CT Brain Perfusion Findings: ASPECTS: 10 CBF (<30%) Volume: 44mL Perfusion (Tmax>6.0s) volume: 268mL Mismatch Volume: 127mL Infarction Location:Right MCA territory involving the frontotemporal and parietal lobe. IMPRESSION: 1. Acute thrombus distal right M1 segment extending into the M2 branches bilaterally. This is occlusive with minimal collateral flow and poor opacification of right MCA branches. 2. No other significant intracranial stenosis or occlusion 3. Carotid bifurcation widely patent bilaterally. Both vertebral arteries patent. 4. 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 5. These results were called by telephone at the time of interpretation on 10/21/2018 at 6:17 pm to Dr. Kerney Elbe , who verbally acknowledged these results. Electronically Signed   By: Franchot Gallo M.D.    On: 10/21/2018 18:19   Ct Head Wo Contrast  Result Date: 10/22/2018 CLINICAL DATA:  Follow-up stroke EXAM: CT HEAD WITHOUT CONTRAST TECHNIQUE: Contiguous axial images were obtained from the base of the skull through the vertex without intravenous contrast. COMPARISON:  10/21/2018 FINDINGS: Brain: No evidence of acute infarction, hemorrhage, hydrocephalus, extra-axial collection or mass lesion/mass effect. Periventricular white matter hypodensity. Vascular: No hyperdense vessel or unexpected calcification. Skull: Normal. Negative for fracture or focal lesion. Sinuses/Orbits: No acute finding. Other: None. IMPRESSION: No acute intracranial pathology. No CT evidence of acute stroke or hemorrhage. Small-vessel white matter disease. Electronically Signed   By: Eddie Candle M.D.   On: 10/22/2018 16:08   Ct Code Stroke Cta Neck W/wo Contrast  Result Date: 10/21/2018 CLINICAL DATA:  Stroke.  Left-sided weakness. EXAM: CT ANGIOGRAPHY HEAD AND NECK CT PERFUSION BRAIN TECHNIQUE: Multidetector CT imaging of the head and neck was performed using the standard protocol during bolus administration of intravenous contrast. Multiplanar CT image reconstructions and MIPs were obtained to evaluate the vascular anatomy. Carotid stenosis measurements (when applicable) are obtained utilizing NASCET criteria, using the distal internal carotid diameter as the denominator. Multiphase CT imaging of the brain was performed following IV bolus contrast injection. Subsequent parametric perfusion maps were calculated using RAPID software. CONTRAST:  13mL OMNIPAQUE IOHEXOL 350 MG/ML SOLN COMPARISON:  CT head 10/21/2018 FINDINGS: CTA NECK FINDINGS Aortic arch: Standard branching. Proximal great vessels widely patent. Decreased attenuation descending thoracic aorta likely due to beam attenuation from a large right  pleural effusion. Ascending aorta normal in caliber and opacification. Right carotid system: Right carotid system widely patent  without stenosis. Mild atherosclerotic calcification right internal carotid artery below the skull base. Carotid bifurcation normal Left carotid system: Left carotid bifurcation widely patent without stenosis. No significant atherosclerotic disease. Vertebral arteries: Both vertebral arteries widely patent to the basilar without stenosis. Skeleton: Cervical spine spondylosis.  No acute bony abnormality Other neck: Negative for mass or adenopathy in the neck. Upper chest: Large right pleural effusion. Patient has a history of lung cancer. Review of the MIP images confirms the above findings CTA HEAD FINDINGS Anterior circulation: Cavernous carotid widely patent bilaterally with mild atherosclerotic calcification. Right M1 segment patent proximally. There is thrombus in the distal right M1 segment. There is occlusive thrombus in right M2 branches with poor collateral flow. Minimal opacification of right middle cerebral artery branches. Azygos anterior cerebral artery which supplies both pericallosal arteries without stenosis. Left middle cerebral artery widely patent. Posterior circulation: Both vertebral arteries patent to the basilar. PICA patent bilaterally. Basilar widely patent. Superior cerebellar and posterior cerebral arteries patent bilaterally. Fetal origin right posterior cerebral artery. Venous sinuses: Minimal venous contrast due to arterial phase scanning Anatomic variants: None Delayed phase: Not performed Review of the MIP images confirms the above findings CT Brain Perfusion Findings: ASPECTS: 10 CBF (<30%) Volume: 32mL Perfusion (Tmax>6.0s) volume: 261mL Mismatch Volume: 115mL Infarction Location:Right MCA territory involving the frontotemporal and parietal lobe. IMPRESSION: 1. Acute thrombus distal right M1 segment extending into the M2 branches bilaterally. This is occlusive with minimal collateral flow and poor opacification of right MCA branches. 2. No other significant intracranial stenosis or  occlusion 3. Carotid bifurcation widely patent bilaterally. Both vertebral arteries patent. 4. 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 5. These results were called by telephone at the time of interpretation on 10/21/2018 at 6:17 pm to Dr. Kerney Elbe , who verbally acknowledged these results. Electronically Signed   By: Franchot Gallo M.D.   On: 10/21/2018 18:19   Mr Brain Wo Contrast  Result Date: 10/23/2018 CLINICAL DATA:  Stroke follow-up.  Left-sided weakness EXAM: MRI HEAD WITHOUT CONTRAST TECHNIQUE: Multiplanar, multiecho pulse sequences of the brain and surrounding structures were obtained without intravenous contrast. COMPARISON:  Head CT from yesterday FINDINGS: Brain: Acute infarction in the superficial right temporal lobe, right insula, basal ganglia, and lateral frontal lobe. No acute infarcts seen in a separate distribution. No hemorrhagic conversion or significant mass effect. Mild or moderate chronic microvascular ischemic change seen in the cerebral white matter. Anterior right frontal lobe the encephalomalacia that may be posttraumatic given location and orbital findings. Vascular: Major flow voids are preserved. Skull and upper cervical spine: Negative for marrow lesion. Sinuses/Orbits: Chronic sinusitis with mild mucosal thickening. Remote medial orbital blowout fracture on the right. Retention cysts in the right maxillary sinus. IMPRESSION: 1. 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 2. Anterior right frontal lobe encephalomalacia, potentially posttraumatic given old facial fractures. Electronically Signed   By: Monte Fantasia M.D.   On: 10/23/2018 06:38   Ct Code Stroke Cta Cerebral Perfusion W/wo Contrast  Result Date: 10/21/2018 CLINICAL DATA:  Stroke.  Left-sided weakness. EXAM: CT ANGIOGRAPHY HEAD AND NECK CT PERFUSION BRAIN TECHNIQUE: Multidetector CT imaging of the head and neck was  performed using the standard protocol during bolus administration of intravenous contrast. Multiplanar CT image reconstructions and MIPs were obtained to evaluate the vascular  anatomy. Carotid stenosis measurements (when applicable) are obtained utilizing NASCET criteria, using the distal internal carotid diameter as the denominator. Multiphase CT imaging of the brain was performed following IV bolus contrast injection. Subsequent parametric perfusion maps were calculated using RAPID software. CONTRAST:  187mL OMNIPAQUE IOHEXOL 350 MG/ML SOLN COMPARISON:  CT head 10/21/2018 FINDINGS: CTA NECK FINDINGS Aortic arch: Standard branching. Proximal great vessels widely patent. Decreased attenuation descending thoracic aorta likely due to beam attenuation from a large right pleural effusion. Ascending aorta normal in caliber and opacification. Right carotid system: Right carotid system widely patent without stenosis. Mild atherosclerotic calcification right internal carotid artery below the skull base. Carotid bifurcation normal Left carotid system: Left carotid bifurcation widely patent without stenosis. No significant atherosclerotic disease. Vertebral arteries: Both vertebral arteries widely patent to the basilar without stenosis. Skeleton: Cervical spine spondylosis.  No acute bony abnormality Other neck: Negative for mass or adenopathy in the neck. Upper chest: Large right pleural effusion. Patient has a history of lung cancer. Review of the MIP images confirms the above findings CTA HEAD FINDINGS Anterior circulation: Cavernous carotid widely patent bilaterally with mild atherosclerotic calcification. Right M1 segment patent proximally. There is thrombus in the distal right M1 segment. There is occlusive thrombus in right M2 branches with poor collateral flow. Minimal opacification of right middle cerebral artery branches. Azygos anterior cerebral artery which supplies both pericallosal arteries without stenosis.  Left middle cerebral artery widely patent. Posterior circulation: Both vertebral arteries patent to the basilar. PICA patent bilaterally. Basilar widely patent. Superior cerebellar and posterior cerebral arteries patent bilaterally. Fetal origin right posterior cerebral artery. Venous sinuses: Minimal venous contrast due to arterial phase scanning Anatomic variants: None Delayed phase: Not performed Review of the MIP images confirms the above findings CT Brain Perfusion Findings: ASPECTS: 10 CBF (<30%) Volume: 61mL Perfusion (Tmax>6.0s) volume: 273mL Mismatch Volume: 163mL Infarction Location:Right MCA territory involving the frontotemporal and parietal lobe. IMPRESSION: 1. Acute thrombus distal right M1 segment extending into the M2 branches bilaterally. This is occlusive with minimal collateral flow and poor opacification of right MCA branches. 2. No other significant intracranial stenosis or occlusion 3. Carotid bifurcation widely patent bilaterally. Both vertebral arteries patent. 4. 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 5. These results were called by telephone at the time of interpretation on 10/21/2018 at 6:17 pm to Dr. Kerney Elbe , who verbally acknowledged these results. Electronically Signed   By: Franchot Gallo M.D.   On: 10/21/2018 18:19   Dg Chest Port 1 View  Result Date: 10/23/2018 CLINICAL DATA:  Acute respiratory failure EXAM: PORTABLE CHEST 1 VIEW COMPARISON:  10/22/2018 FINDINGS: Support devices are stable. Moderate/large right pleural effusion and right base atelectasis, stable. Cardiomegaly. Left lung clear. IMPRESSION: Stable right effusion with right lower lobe atelectasis. Cardiomegaly. Electronically Signed   By: Rolm Baptise M.D.   On: 10/23/2018 08:18   Portable Chest Xray  Result Date: 10/22/2018 CLINICAL DATA:  COPD, lung cancer, pleural effusion EXAM: PORTABLE CHEST 1 VIEW COMPARISON:  10/21/2018 FINDINGS: Endotracheal tube and NG  tube are in place. Moderate to large right pleural effusion is stable since prior study. Right lower lobe atelectasis or infiltrate is stable. Left lung clear. No acute bony abnormality. IMPRESSION: Moderate to large-sized right pleural effusion with right lower lobe atelectasis or infiltrate, stable. Electronically Signed   By: Rolm Baptise M.D.   On: 10/22/2018 08:10   Dg Chest Portable 1 View  Result Date:  10/21/2018 CLINICAL DATA:  Intubation EXAM: PORTABLE CHEST 1 VIEW COMPARISON:  09/27/2018 FINDINGS: The endotracheal tube terminates above the carina by approximately 6 cm. There is a multiloculated right-sided pleural effusion which has increased in size from prior study. No evidence of a right-sided pneumothorax. The left lung field is mostly clear. IMPRESSION: 1. Endotracheal tube as above. 2. Interval increase in size of the right-sided pleural effusion. Electronically Signed   By: Constance Holster M.D.   On: 10/21/2018 18:55   Dg Abd Portable 1v  Result Date: 10/21/2018 CLINICAL DATA:  Orogastric tube placement EXAM: PORTABLE ABDOMEN - 1 VIEW COMPARISON:  None. FINDINGS: Orogastric tube tip and side port are in the stomach. There is moderate stool in the colon. There are loops of mildly dilated colon which likely is indicative of a degree of ileus. Bowel obstruction not felt to be likely. No free air. There is a right pleural effusion with consolidation in the right lung base. IMPRESSION: Orogastric tube tip and side port in stomach. Suspect a degree of ileus. No free air. Right pleural effusion and right base consolidation noted. Electronically Signed   By: Lowella Grip III M.D.   On: 10/21/2018 23:25   Ct Head Code Stroke Wo Contrast  Result Date: 10/21/2018 CLINICAL DATA:  Code stroke.  Stroke.  Left-sided weakness aphasia EXAM: CT HEAD WITHOUT CONTRAST TECHNIQUE: Contiguous axial images were obtained from the base of the skull through the vertex without intravenous contrast.  COMPARISON:  CT head 11/13/2017 FINDINGS: Brain: Generalized atrophy. Chronic ischemic changes in the white matter. Chronic infarct right anterior frontal lobe. No evidence of acute infarct, hemorrhage, mass Vascular: Hyperdense right MCA compatible with acute thrombus. This appears to be in the distal right M1 and M2 segments Skull: Negative Sinuses/Orbits: Chronic fracture right medial orbit. Chronic fracture left zygomatic arch. Cyst in the right maxillary sinus. No orbital mass lesion. Other: None ASPECTS ([place] Stroke Program Early CT Score) - Ganglionic level infarction (caudate, lentiform nuclei, internal capsule, insula, M1-M3 cortex): 7 - Supraganglionic infarction (M4-M6 cortex): 3 Total score (0-10 with 10 being normal): 10 IMPRESSION: 1. Atrophy and chronic ischemic change. No acute infarct or hemorrhage 2. Hyperdense right MCA compatible with acute thrombus. 3. ASPECTS is 10 4. These results were called by telephone at the time of interpretation on 10/21/2018 at 5:48 pm to Dr. Cheral Marker , who verbally acknowledged these results. Electronically Signed   By: Franchot Gallo M.D.   On: 10/21/2018 17:49   [place] Ekg Site Rite  Result Date: 10/22/2018 If Site Rite image not attached, placement could not be confirmed due to current cardiac rhythm.   Labs:  CBC: Recent Labs    10/02/18 0509 10/21/18 1729 10/21/18 1737 10/21/18 2202 10/22/18 0425 10/23/18 0323  WBC 6.3 6.1  --   --  9.1 9.7  HGB 15.5 14.7 15.6 13.6 14.5 14.5  HCT 47.0 44.5 46.0 40.0 42.4 42.8  PLT 339 333  --   --  352 325    COAGS: Recent Labs    10/21/18 1729  INR 1.2  APTT 36    BMP: Recent Labs    10/02/18 0509 10/21/18 1729 10/21/18 1737 10/21/18 2202 10/22/18 0425 10/23/18 0323  NA 141 143 143 139 143 145  K 4.1 3.9 3.9 3.6 3.6 3.4*  CL 100 106 106  --  108 111  CO2 30 27  --   --  22 24  GLUCOSE 93 81 77  --  92 83  BUN  15 12 13   --  12 13  CALCIUM 8.9 9.1  --   --  9.0 8.5*  CREATININE 0.97 1.03  0.90  --  0.97 1.09  GFRNONAA >60 >60  --   --  >60 >60  GFRAA >60 >60  --   --  >60 >60    LIVER FUNCTION TESTS: Recent Labs    09/05/18 0804 09/27/18 0102 09/29/18 0449 10/21/18 1729  BILITOT 0.6 0.6 0.6 0.6  AST 45* 62* 31 23  ALT 41 51* 35 25  ALKPHOS 83 79 73 73  PROT 6.2* 6.2* 5.6* 6.5  ALBUMIN 3.2* 3.3* 2.7* 3.1*    Assessment and Plan:  Rt MCA revascularization 5/26; intubated; moving all 4s-- purposeful-- not to command Will follow Report to Dr Estanislado Pandy   Electronically Signed: Lavonia Drafts, PA-C 10/23/2018, 8:40 AM   I spent a total of 15 Minutes at the the patient's bedside AND on the patient's hospital floor or unit, greater than 50% of which was counseling/coordinating care for Rt MCA revascularization    Patient ID: Jared Stevens, male   DOB: [birth year], 68 y.o.   MRN: [id number]

## 2018-10-23 NOTE — Progress Notes (Signed)
SLP Cancellation Note  Patient Details Name: Jared Stevens MRN: [id number] DOB: 1950/07/21   Cancelled treatment:       Reason Eval/Treat Not Completed: Medical issues which prohibited therapy;Patient not medically ready(Pt remains on vent at this time. SLP will follow up. )  Myleen Brailsford I. Hardin Negus, [place], [place] Office number [phone] Pager [place] 10/23/2018, 7:56 AM

## 2018-10-23 NOTE — Evaluation (Addendum)
Physical Therapy Evaluation Patient Details Name: Jared Stevens MRN: [id number] DOB: 09-24-1950 Today's Date: 10/23/2018   History of Present Illness  Pt is a 68 y.o. M with significant PMH of stage III NSCLC (adenocarcinoma) s/p chemo 2018, COPD, ischemic cardiomyopathy with EF 15% who presents with left hemineglect and weakness. CTA head and neck reveals right M1 and M2 occlusion with acute thrombus in the distal right M1 segment leading into M2 branches bilaterally. MRI showing acute patchy right MCA infarct involving right temporal lobe, right insula, basal ganglia and lateral frontal lobe.  Clinical Impression  Pt admitted with above. Prior to admission, pt was independent and living in a second floor apartment alone. On PT evaluation, pt presents with decreased functional mobility secondary to left inattention/neglect, decreased cognition, balance impairments, and decreased endurance. Ambulating 15 feet with two person moderate assistance for stability. Per phone call with pt daughter, she is willing to provide additional support/assistance at discharge. Recommending CIR to maximize functional independence given pt PLOF and motivation.  I have discussed the patient's current level of function related to R MCA stroke with the patient and patient daughter .  They acknowledge understanding of this and do not feel the patient would be able to have their care needs met at home.  They are interested in post-acute rehab in an inpatient setting.        Follow Up Recommendations CIR;Supervision/Assistance - 24 hour    Equipment Recommendations  Rolling walker with 5" wheels    Recommendations for Other Services Rehab consult     Precautions / Restrictions Precautions Precautions: Fall Precaution Comments: L inattention/neglect Restrictions Weight Bearing Restrictions: No      Mobility  Bed Mobility Overal bed mobility: Needs Assistance Bed Mobility: Supine to Sit;Sit to Supine      Supine to sit: Mod assist Sit to supine: Mod assist   General bed mobility comments: modA for sitting up towards left side, max cues for initiation (likely due to going towards affected side)  Transfers Overall transfer level: Needs assistance Equipment used: None Transfers: Sit to/from Stand Sit to Stand: Min assist         General transfer comment: Min assist to stand from edge of bed, pt utilizing wide BOS  Ambulation/Gait Ambulation/Gait assistance: Mod assist;+2 physical assistance Gait Distance (Feet): 15 Feet Assistive device: 2 person hand held assist Gait Pattern/deviations: Step-through pattern;Narrow base of support Gait velocity: decreased Gait velocity interpretation: <1.8 ft/sec, indicate of risk for recurrent falls General Gait Details: Pt with narrow BOS, cues for increased left foot clearance, sequencing, direction. Increased difficulty with sequencing turns requiring hand over hand guidance   Stairs            Wheelchair Mobility    Modified Rankin (Stroke Patients Only) Modified Rankin (Stroke Patients Only) Pre-Morbid Rankin Score: No symptoms Modified Rankin: Moderately severe disability     Balance Overall balance assessment: Needs assistance Sitting-balance support: Feet supported Sitting balance-Leahy Scale: Fair     Standing balance support: Bilateral upper extremity supported Standing balance-Leahy Scale: Poor Standing balance comment: reliant on at least single UE support                             Pertinent Vitals/Pain Pain Assessment: Faces Faces Pain Scale: No hurt    Home Living Family/patient expects to be discharged to:: Private residence Living Arrangements: Alone Available Help at Discharge: Family;Available PRN/intermittently(daughter) Type of Home: Apartment Home Access: Stairs to  enter   Technical brewer of Steps: flight Home Layout: One level Home Equipment: None      Prior Function Level  of Independence: Independent               Hand Dominance        Extremity/Trunk Assessment   Upper Extremity Assessment Upper Extremity Assessment: Defer to OT evaluation    Lower Extremity Assessment Lower Extremity Assessment: RLE deficits/detail;LLE deficits/detail RLE Deficits / Details: Strength 5/5 LLE Deficits / Details: Strength 5/5 LLE Coordination: decreased gross motor    Cervical / Trunk Assessment Cervical / Trunk Assessment: Normal  Communication   Communication: No difficulties  Cognition Arousal/Alertness: Awake/alert Behavior During Therapy: WFL for tasks assessed/performed Overall Cognitive Status: Impaired/Different from baseline Area of Impairment: Orientation;Attention;Problem solving;Awareness;Safety/judgement;Following commands                 Orientation Level: Disoriented to;Time;Situation Current Attention Level: Focused   Following Commands: Follows one step commands inconsistently;Follows one step commands with increased time Safety/Judgement: Decreased awareness of deficits Awareness: Intellectual Problem Solving: Slow processing;Decreased initiation;Difficulty sequencing;Requires tactile cues;Requires verbal cues General Comments: Pt thinking he was in hospital for lung CA (which he was diagnosed with in 2018). Very slow processing and difficulty with directing attention to task      General Comments      Exercises     Assessment/Plan    PT Assessment Patient needs continued PT services  PT Problem List Decreased strength;Decreased activity tolerance;Decreased balance;Decreased mobility;Decreased coordination;Decreased cognition;Decreased safety awareness       PT Treatment Interventions DME instruction;Gait training;Stair training;Functional mobility training;Therapeutic activities;Therapeutic exercise;Balance training;Neuromuscular re-education;Patient/family education    PT Goals (Current goals can be found in the  Care Plan section)  Acute Rehab PT Goals Patient Stated Goal: "to eat a regular meal." PT Goal Formulation: With patient Time For Goal Achievement: 11/06/18 Potential to Achieve Goals: Good    Frequency Min 4X/week   Barriers to discharge        Co-evaluation               AM-PAC PT "6 Clicks" Mobility  Outcome Measure Help needed turning from your back to your side while in a flat bed without using bedrails?: A Little Help needed moving from lying on your back to sitting on the side of a flat bed without using bedrails?: A Lot Help needed moving to and from a bed to a chair (including a wheelchair)?: A Lot Help needed standing up from a chair using your arms (e.g., wheelchair or bedside chair)?: A Little Help needed to walk in hospital room?: A Lot Help needed climbing 3-5 steps with a railing? : A Lot 6 Click Score: 14    End of Session Equipment Utilized During Treatment: Gait belt Activity Tolerance: Patient tolerated treatment well Patient left: in bed;with call bell/phone within reach;with bed alarm set;with SCD's reapplied Nurse Communication: Mobility status PT Visit Diagnosis: Other abnormalities of gait and mobility (R26.89);Unsteadiness on feet (R26.81);Other symptoms and signs involving the nervous system (R29.898)    Time: 0938-1829 PT Time Calculation (min) (ACUTE ONLY): 41 min   Charges:   PT Evaluation $PT Eval Moderate Complexity: 1 Mod PT Treatments $Gait Training: 8-22 mins       Ellamae Sia, PT, DPT Acute Rehabilitation Services Pager [phone] Office [phone]  Willy Eddy 10/23/2018, 4:53 PM

## 2018-10-23 NOTE — Progress Notes (Signed)
NAME:  Jared Stevens, MRN:  [id number], DOB:  13-Jun-1950, LOS: 2 ADMISSION DATE:  10/21/2018, CONSULTATION DATE:  10/21/18 REFERRING MD:  Estanislado Pandy  CHIEF COMPLAINT:  Left sided weakness   Brief History   Jared Stevens is a 68 y.o. male who was admitted 5/25 with R MCA infarct s/p tPA administration and VIR revascularization. Post op, transferred to ICU where he remained on the ventilator.  History of present illness   Pt is encephelopathic; therefore, this HPI is obtained from chart review. Jared Stevens is a 68 y.o. male who has a PMH including but not limited to stage III NSCLC (adenocarcinoma) s/p chemo / XRT 2018 and followed by Dr. Earlie Server   He presented to [facility] 5/25 with left sided weakness and hemineglect.  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  He was deemed to be a tPA candidate which was administered at [facility].  He was then taken to VIR for revascularization of occluded RMCA.  Post operatively, he was transferred to the ICU and PCCM was asked to see in consultation for vent management.  Past Medical History  Stage III NSCLC (adenocarcinoma) s/p chemoradiation 2018, COPD, HLD. Chronic right effusion with negative cytology 5/4 & 4/9  Ischemic cardiomyopathy with EF 15%  Significant Hospital Events   5/25 > admit.  Consults:  PCCM. IR.  Procedures:  ETT 5/25 >  R fem sheath 5/25 > 5/26  Significant Diagnostic Tests:  CT Head 5/25 > hyperdense R MCA, aspects 10. CTA / CTP head 5/25 > R M1 thrombus extending into M2 branches, R MCA infarct with 66ml core infarct and 134ml penumbra. CT head 5/26 > neg MRI brain 5/26 > Extensive acute infarction in the right MCA territory involving cortex and basal ganglia.  Anterior right frontal lobe encephalomalacia Echo 5/26 >   Micro Data:  SARS CoV 2 5/25 > negative.  Antimicrobials:  None.   Interim history/subjective:   Critically ill, intubated Sedated with propofol Remains hypotensive,  Neo-Synephrine drip Good urine output with Lasix   Objective:  Blood pressure (!) 122/91, pulse 95, temperature 98 F (36.7 C), temperature source Axillary, resp. rate 15, height 6\' 1"  (1.854 m), weight 60.3 kg, SpO2 100 %.    Vent Mode: PRVC FiO2 (%):  [35 %-40 %] 35 % Set Rate:  [15 bmp] 15 bmp Vt Set:  [640 mL] 640 mL PEEP:  [5 cmH20] 5 cmH20 Plateau Pressure:  [16 cmH20-18 cmH20] 18 cmH20   Intake/Output Summary (Last 24 hours) at 10/23/2018 0908 Last data filed at 10/23/2018 0800 Gross per 24 hour  Intake 3028.06 ml  Output 2150 ml  Net 878.06 ml   Filed Weights   10/21/18 1730  Weight: 60.3 kg    Examination: General: Chronically ill appearing male, appears older than stated age, in NAD. Neuro: Sedated, unresponsive.  On propofol, RA SS -3 does not move left side HEENT: [place]/AT. Sclerae anicteric.  ETT in place. Cardiovascular: RRR, no M/R/G.  Lungs: Decreased breath sounds right, no accessory muscle use Abdomen: BS x 4, soft, NT/ND.  Musculoskeletal: No gross deformities, no edema.  Skin: Intact, warm, no rashes. Pulses good No bleeding from right groin, sheath removed  X-ray 5/27 personally reviewed shows ET tube in position and chronic right effusion  Assessment & Plan:   R MCA occlusion - s/p VIR revascularization 5/25. - Stroke workup / management per neuro. -Now on phenylephrine to maintain systolic blood pressure 1 20-1 40 -Propofol for sedation,  goal RA SS 0, daily wake-up assessment  Acute respiratory insufficiency - due to inability to protect the airway in the setting of above. Hx COPD per report (no PFT's in Epic). -  Daily SBT -if passes spontaneous breathing trial, will proceed with extubation   Elevated troponin - EKG normal. 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 - Continue preadmission lipitor.  -Diurese with Lasix 20 twice daily Hypokalemia will be repleted   Hx stage III NSCLC (adenocarcinoma) s/p chemo / XRT in 2018 (followed by Dr. Earlie Server). - Outpatient f/u with oncology. -Last CT from 4/9 showed some nodularity but prior PET scan 04/2018 reviewed which did not show any hypermetabolism, hence under observation  Hx right pleural effusion now with recurrence - s/p thoracentesis 09/27/18 with 1.7L clear yellow fluid removed.  Transudative fluid. --Thoracentesis only if he develops respiratory distress, otherwise diuresis    Best Practice:  Diet: NPO. Pain/Anxiety/Delirium protocol (if indicated): Propofol gtt / Fentnanyl PRN / Midazolam PRN.  RASS goal 0 to -1. VAP protocol (if indicated): In place. DVT prophylaxis: SCD's. GI prophylaxis: Pantoprazole. Glucose control: None. Mobility: Bedrest. Code Status: Full. Family Communication: per stroke service Disposition: ICU.   The patient is critically ill with multiple organ systems failure and requires high complexity decision making for assessment and support, frequent evaluation and titration of therapies, application of advanced monitoring technologies and extensive interpretation of multiple databases. Critical Care Time devoted to patient care services described in this note independent of APP/resident  time is 32 minutes.    Kara Mead MD. Shade Flood. [facility] Pager [phone] If no response call [phone]    10/23/2018, 9:08 AM

## 2018-10-23 NOTE — Evaluation (Signed)
Occupational Therapy Evaluation Patient Details Name: Jared Stevens MRN: [id number] DOB: 12/17/1950 Today's Date: 10/23/2018    History of Present Illness Pt is a 68 y.o. M with significant PMH of stage III NSCLC (adenocarcinoma) s/p chemo 2018, COPD, ischemic cardiomyopathy with EF 15% who presents with left hemineglect and weakness. CTA head and neck reveals right M1 and M2 occlusion with acute thrombus in the distal right M1 segment leading into M2 branches bilaterally. MRI showing acute patchy right MCA infarct involving right temporal lobe, right insula, basal ganglia and lateral frontal lobe.   Clinical Impression   PTA patient independent with ADLs/mobility. Admitted for above and limited by problem list below, including L inattention/neglect, impaired balance, decreased cognition, impaired vision? and poor activity tolerance.  Patient oriented to person and place, but required redirection to situation and time; he was able to follow some commands, but inconsistently and requires greatly increased time to process and sequence tasks, inconsistently neglecting L side/enviorment (able to initiate washing hands/face with L hand, but does not attend to during formal testing). Patient requires min- max assist for ADLs, min assist for transfers and mod assist +2 for in room mobility.  PT speaking to daughter reporting, she is willing to provide additional assistance at discharge as needed. Patient will benefit from continued OT services while admitted and after dc at CIR level in order to maximize independence and safety with ADLs/mobility prior to dc home.      Follow Up Recommendations  CIR;Supervision/Assistance - 24 hour    Equipment Recommendations  3 in 1 bedside commode    Recommendations for Other Services       Precautions / Restrictions Precautions Precautions: Fall Precaution Comments: L inattention/neglect Restrictions Weight Bearing Restrictions: No      Mobility Bed  Mobility Overal bed mobility: Needs Assistance Bed Mobility: Supine to Sit;Sit to Supine     Supine to sit: Mod assist Sit to supine: Mod assist   General bed mobility comments: modA for sitting up towards left side, mulitmodal cueing for initation going towards L side (to sit and to supine)  Transfers Overall transfer level: Needs assistance Equipment used: None Transfers: Sit to/from Stand Sit to Stand: Min assist         General transfer comment: Min assist to stand from edge of bed, pt utilizing wide BOS    Balance Overall balance assessment: Needs assistance Sitting-balance support: Feet supported Sitting balance-Leahy Scale: Fair     Standing balance support: Bilateral upper extremity supported Standing balance-Leahy Scale: Poor Standing balance comment: reliant on at least single UE support                           ADL either performed or assessed with clinical judgement   ADL Overall ADL's : Needs assistance/impaired     Grooming: Minimal assistance;Sitting Grooming Details (indicate cue type and reason): able to wash/dry face/hands but requires cueing to utilize L UE at times  Upper Body Bathing: Minimal assistance;Sitting   Lower Body Bathing: Moderate assistance;+2 for physical assistance;+2 for safety/equipment;Sit to/from stand   Upper Body Dressing : Minimal assistance;Sitting   Lower Body Dressing: Moderate assistance;+2 for physical assistance;+2 for safety/equipment;Sit to/from stand   Toilet Transfer: Moderate assistance;Minimal assistance;+2 for physical assistance;Ambulation Toilet Transfer Details (indicate cue type and reason): simulated in room         Functional mobility during ADLs: Moderate assistance;+2 for safety/equipment;+2 for physical assistance;Cueing for safety;Cueing for sequencing General ADL Comments:  pt limited by cognition, vision, impaired balance, coordination     Vision Baseline Vision/History: Wears  glasses Wears Glasses: At all times Patient Visual Report: No change from baseline(but does not have glasses ) Additional Comments: visual assessment deferred as pt perseverating on not having his glasses and everything is blurry- able to scan functionally and denies double vision      Perception Perception Perception Tested?: Yes Perception Deficits: Inattention/neglect Inattention/Neglect: Does not attend to left visual field;Does not attend to left side of body   Praxis      Pertinent Vitals/Pain Pain Assessment: Faces Faces Pain Scale: No hurt     Hand Dominance Right   Extremity/Trunk Assessment Upper Extremity Assessment Upper Extremity Assessment: RUE deficits/detail;LUE deficits/detail RUE Deficits / Details: able to use functionally, strength 5/5 MMT  LUE Deficits / Details: able to use functionally if attending to UE, inattention to L UE; grossly 4+/5 MMT, decreased sensation and coordination  LUE Sensation: decreased light touch;decreased proprioception LUE Coordination: decreased fine motor;decreased gross motor   Lower Extremity Assessment Lower Extremity Assessment: Defer to PT evaluation RLE Deficits / Details: Strength 5/5 LLE Deficits / Details: Strength 5/5 LLE Coordination: decreased gross motor   Cervical / Trunk Assessment Cervical / Trunk Assessment: Normal   Communication Communication Communication: No difficulties   Cognition Arousal/Alertness: Awake/alert Behavior During Therapy: WFL for tasks assessed/performed Overall Cognitive Status: Impaired/Different from baseline Area of Impairment: Orientation;Attention;Problem solving;Awareness;Safety/judgement;Following commands                 Orientation Level: Disoriented to;Time;Situation Current Attention Level: Focused   Following Commands: Follows one step commands inconsistently;Follows one step commands with increased time Safety/Judgement: Decreased awareness of deficits Awareness:  Intellectual Problem Solving: Slow processing;Decreased initiation;Difficulty sequencing;Requires tactile cues;Requires verbal cues General Comments: pt oriented to person and place only, requires re-orientation to situation; easily distraction, constant redirection to task with poor awareness to deficits; increased time required for processing and task sequencing    General Comments       Exercises     Shoulder Instructions      Home Living Family/patient expects to be discharged to:: Private residence Living Arrangements: Alone Available Help at Discharge: Family;Available PRN/intermittently(daughter) Type of Home: Apartment Home Access: Stairs to enter Entrance Stairs-Number of Steps: flight   Home Layout: One level     Bathroom Shower/Tub: Teacher, early years/pre: Standard     Home Equipment: None          Prior Functioning/Environment Level of Independence: Independent                 OT Problem List: Decreased activity tolerance;Impaired balance (sitting and/or standing);Impaired vision/perception;Decreased coordination;Decreased cognition;Decreased safety awareness;Decreased knowledge of use of DME or AE;Decreased knowledge of precautions;Impaired sensation      OT Treatment/Interventions: Self-care/ADL training;Neuromuscular education;DME and/or AE instruction;Therapeutic activities;Cognitive remediation/compensation;Visual/perceptual remediation/compensation;Patient/family education;Balance training    OT Goals(Current goals can be found in the care plan section) Acute Rehab OT Goals Patient Stated Goal: "to eat a regular meal." OT Goal Formulation: With patient Time For Goal Achievement: 11/06/18 Potential to Achieve Goals: Good ADL Goals Pt Will Perform Grooming: with supervision;standing Pt Will Perform Upper Body Bathing: with set-up;with supervision;sitting Pt Will Perform Lower Body Dressing: with supervision;sit to/from stand Pt Will  Transfer to Toilet: with supervision;ambulating;bedside commode Additional ADL Goal #1: Pt will follow 2 step commands with 90% accuracy in order to maximize participation in ADLs. Additional ADL Goal #2: Patient will attend to L side of  body/environment with no more than minimal cueing during self care routine.  OT Frequency: Min 3X/week   Barriers to D/C:            Co-evaluation PT/OT/SLP Co-Evaluation/Treatment: Yes Reason for Co-Treatment: Complexity of the patient's impairments (multi-system involvement)   OT goals addressed during session: ADL's and self-care      AM-PAC OT "6 Clicks" Daily Activity     Outcome Measure Help from another person eating meals?: A Little Help from another person taking care of personal grooming?: A Little Help from another person toileting, which includes using toliet, bedpan, or urinal?: A Lot Help from another person bathing (including washing, rinsing, drying)?: A Lot Help from another person to put on and taking off regular upper body clothing?: A Little Help from another person to put on and taking off regular lower body clothing?: A Lot 6 Click Score: 15   End of Session Equipment Utilized During Treatment: Gait belt Nurse Communication: Mobility status  Activity Tolerance: Patient tolerated treatment well Patient left: in bed;with call bell/phone within reach;with bed alarm set  OT Visit Diagnosis: Other abnormalities of gait and mobility (R26.89);Other symptoms and signs involving cognitive function;Cognitive communication deficit (R41.841);Other symptoms and signs involving the nervous system (R29.898) Symptoms and signs involving cognitive functions: Cerebral infarction                Time: 1500-1536 OT Time Calculation (min): 36 min Charges:  OT General Charges $OT Visit: 1 Visit OT Evaluation $OT Eval Moderate Complexity: Jared Stevens, OT Acute Rehabilitation Services Pager [phone] Office  [phone]   Jared Stevens 10/23/2018, 5:15 PM

## 2018-10-24 ENCOUNTER — Inpatient Hospital Stay (HOSPITAL_COMMUNITY): Payer: Medicare HMO

## 2018-10-24 DIAGNOSIS — I724 Aneurysm of artery of lower extremity: Secondary | ICD-10-CM

## 2018-10-24 LAB — CBC
HCT: 38.1 % — ABNORMAL LOW (ref 39.0–52.0)
Hemoglobin: 12.6 g/dL — ABNORMAL LOW (ref 13.0–17.0)
MCH: 29 pg (ref 26.0–34.0)
MCHC: 33.1 g/dL (ref 30.0–36.0)
MCV: 87.8 fL (ref 80.0–100.0)
Platelets: 267 10*3/uL (ref 150–400)
RBC: 4.34 MIL/uL (ref 4.22–5.81)
RDW: 15.1 % (ref 11.5–15.5)
WBC: 7 10*3/uL (ref 4.0–10.5)
nRBC: 0 % (ref 0.0–0.2)

## 2018-10-24 LAB — BASIC METABOLIC PANEL
Anion gap: 8 (ref 5–15)
BUN: 19 mg/dL (ref 8–23)
CO2: 27 mmol/L (ref 22–32)
Calcium: 8.1 mg/dL — ABNORMAL LOW (ref 8.9–10.3)
Chloride: 108 mmol/L (ref 98–111)
Creatinine, Ser: 0.93 mg/dL (ref 0.61–1.24)
GFR calc Af Amer: >60 mL/min (ref >60)
GFR calc non Af Amer: >60 mL/min (ref >60)
Glucose, Bld: 95 mg/dL (ref 70–99)
Potassium: 3.1 mmol/L — ABNORMAL LOW (ref 3.5–5.1)
Sodium: 143 mmol/L (ref 135–145)

## 2018-10-24 LAB — MAGNESIUM: Magnesium: 1.8 mg/dL (ref 1.7–2.4)

## 2018-10-24 LAB — PHOSPHORUS: Phosphorus: 2.8 mg/dL (ref 2.5–4.6)

## 2018-10-24 MED ORDER — CHLORHEXIDINE GLUCONATE CLOTH 2 % EX PADS
6.0000 | MEDICATED_PAD | Freq: Every day | CUTANEOUS | Status: DC
  Administered 2018-10-24 – 2018-10-29 (×5): 6 via TOPICAL

## 2018-10-24 MED ORDER — ASPIRIN 325 MG PO TABS
325.0000 mg | ORAL_TABLET | Freq: Every day | ORAL | Status: DC
  Administered 2018-10-24 – 2018-10-28 (×5): 325 mg via ORAL
  Filled 2018-10-24 (×6): qty 1

## 2018-10-24 MED ORDER — POTASSIUM CHLORIDE CRYS ER 20 MEQ PO TBCR
20.0000 meq | EXTENDED_RELEASE_TABLET | Freq: Two times a day (BID) | ORAL | Status: DC
  Administered 2018-10-24 – 2018-10-26 (×5): 20 meq via ORAL
  Filled 2018-10-24 (×5): qty 1

## 2018-10-24 MED ORDER — WARFARIN - PHARMACIST DOSING INPATIENT
Freq: Every day | Status: DC
  Administered 2018-10-24 – 2018-10-25 (×2)

## 2018-10-24 MED ORDER — CARVEDILOL 3.125 MG PO TABS
3.1250 mg | ORAL_TABLET | Freq: Two times a day (BID) | ORAL | Status: DC
  Administered 2018-10-24 – 2018-10-29 (×8): 3.125 mg via ORAL
  Filled 2018-10-24 (×10): qty 1

## 2018-10-24 MED ORDER — WARFARIN SODIUM 5 MG PO TABS
5.0000 mg | ORAL_TABLET | Freq: Once | ORAL | Status: AC
  Administered 2018-10-24: 5 mg via ORAL
  Filled 2018-10-24: qty 1

## 2018-10-24 NOTE — Progress Notes (Signed)
Inpatient Rehab Admissions:  Inpatient Rehab Consult received.  I met with patient at the bedside for rehabilitation assessment and to discuss goals and expectations of an inpatient rehab admission.  He is agreeable to CIR stay for rehab.  I will need to confirm that he has 24/7 supervision and obtain insurance authorization prior to any possible admission.  I will attempt to contact his daughter today.   Signed: Shann Medal, PT, DPT Admissions Coordinator [phone] 10/24/18  3:29 PM

## 2018-10-24 NOTE — Progress Notes (Signed)
Limited right lower extremity arterial duplex has been completed. Refer to "CV Proc" under chart review to view preliminary results.  10/24/2018 10:02 AM Maudry Mayhew, MHA, RVT, RDCS, RDMS

## 2018-10-24 NOTE — Progress Notes (Signed)
Stroke Team Progress Note  SUBJECTIVE    He was extubated yesterday and is doing well.  He is on the phone talking to the family..  Blood pressure has been adequately controlled.  He is following commands.    OBJECTIVE Most recent Vital Signs: Temp: 98 F (36.7 C) (05/28 1600) Temp Source: Oral (05/28 1600) BP: 113/83 (05/28 1700) Pulse Rate: 107 (05/28 1700) Respiratory Rate: (!) 33 O2 Saturdation: 95%  CBG (last 3)  Recent Labs    10/21/18 2344 10/22/18 0431  GLUCAP 73 93       Studies:  CTA head and neck reveals right M1 and M2 occlusion with acute thrombus in the distal right M1 segment extending into the M2 branches bilaterally. This is occlusive with minimal collateral flow and poor opacification of right MCA branches. 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 46 mL  MRI pending   ECHO 09/27/18 revealed estimated left ventricular ejection fraction of 15%. The cavity size was moderately dilated. Left ventricular diastolic Doppler parameters are consistent with impaired relaxation. The right ventricle had severely reduced systolic function. The cavity was normal. There is no increase in right ventricular wall thickness. Trivial pericardial effusion was present. LDL 94 mg percent HbA1c 5.7 MRI scan of the brain :  Acute patchy right MCA infarct involving right temporal lobe, right insula, basal ganglia and lateral frontal lobe.  Old encephalomalacia in anterior frontal lobe likely from posttraumatic etiology Physical Exam:    Frail middle-aged African-American male who is    not in distress. . Afebrile. Head is nontraumatic. Neck is supple without bruit.    Cardiac exam no murmur or gallop. Lungs are clear to auscultation. Distal pulses are well felt. Neurological Exam : Patient is awake alert and interactive.  No aphasia or dysarthria.  He has right gaze preference and he can look to the left past midline but not all the way.  He blinks to threat on the right but not consistently  on the left.  Pupils are equal reactive.  Fundi could not be visualized.  He has left lower facial weakness.  Tongue midline.  He has left hemiparesis but is able to move left upper and lower extremity with gravity eliminated.  He has purposeful antigravity movements on the right side.  Response to pain on either side but more on the right than the left.  Right plantars downgoing left is equivocal. ASSESSMENT Mr. Kort Stettler is a 68 y.o. 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  Mobilize out of bed.  Physical speech and occupational therapy consults.  Rehab consults.  Transfer to neurology floor bed.    Discussed with Dr. Elsworth Soho pulmonary critical care medicine.  I also spoke to the patient's daughter over the phone and gave her an update about his condition and answered questions.  Treatment of his adenocarcinoma lung and chronic pleural effusion as per pulmonary critical care team.  Can be done later as an outpatient I have spent a total of   37minutes with the patient reviewing hospital notes,  test results, labs and examining the  patient as well as establishing an assessment and plan that was discussed personally with the patient.  > 50% of time was spent in direct patient care.      Antony Contras, MD Medical Director [facility] St[facility] Pager: [phone] 10/24/2018 5:30 PM

## 2018-10-24 NOTE — Progress Notes (Signed)
ANTICOAGULATION CONSULT NOTE - Initial Consult  Pharmacy Consult for warfarin Indication: cardioembolic stroke  No Known Allergies  Patient Measurements: Height: 6\' 1"  (185.4 cm) Weight: 133 lb (60.3 kg) IBW/kg (Calculated) : 79.9  Vital Signs: Temp: 97.3 F (36.3 C) (05/28 0800) Temp Source: Oral (05/28 0800) BP: 130/71 (05/28 1000) Pulse Rate: 100 (05/28 1000)  Labs: Recent Labs    10/21/18 1729  10/22/18 0056 10/22/18 0425 10/22/18 1208 10/23/18 0323 10/24/18 0539  HGB 14.7   < >  --  14.5  --  14.5 12.6*  HCT 44.5   < >  --  42.4  --  42.8 38.1*  PLT 333  --   --  352  --  325 267  APTT 36  --   --   --   --   --   --   LABPROT 15.1  --   --   --   --   --   --   INR 1.2  --   --   --   --   --   --   CREATININE 1.03   < >  --  0.97  --  1.09 0.93  TROPONINI 0.11*  --  0.15* 0.18* 0.11*  --   --    < > = values in this interval not displayed.    Estimated Creatinine Clearance: 65.7 mL/min (by C-G formula based on SCr of 0.93 mg/dL).   Medical History: Past Medical History:  Diagnosis Date  . Combined congestive systolic and diastolic heart failure ([place])   . COPD (chronic obstructive pulmonary disease) ([place])   . Hypercholesteremia   . Lung cancer ([place]) 03/2016   Assessment: Jared Stevens is a 68yo male admitted with R MCA infarct s/p tpA and thrombectomy on 5/25. Patient with EF found to be 15% with impaired relaxation and severely reduced RV function. Pharmacy consulted to start warfarin.    Data from a sub-analysis of the WARCEF trial in 2014 supports stroke risk reduction with warfarin in HF patients with prior stroke and severely reduced EF.   Goal of Therapy:  INR 2-3 Monitor platelets by anticoagulation protocol: Yes   Plan:  Start warfarin 5mg  tonight x1 Daily INR F/u plans for echo?  Thank you for involving pharmacy in this patient's care.  Janae Bridgeman, PharmD PGY1 Pharmacy Resident Phone: [phone] 10/24/2018 11:00 AM

## 2018-10-24 NOTE — Evaluation (Signed)
Speech Language Pathology Evaluation Patient Details Name: Jared Stevens MRN: [id number] DOB: [birth year] Today's Date: 10/24/2018 Time: 8413-2440 SLP Time Calculation (min) (ACUTE ONLY): 40 min  Problem List:  Patient Active Problem List   Diagnosis Date Noted  . Stroke (cerebrum) ([place]) 10/21/2018  . 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 Recurrent right pleural effusion   . Acute systolic CHF (congestive heart failure) ([place])   . CHF exacerbation ([place]) 09/27/2018  . History of lung cancer 09/27/2018  . Acute on chronic respiratory failure with hypoxia ([place]) 09/27/2018  . Elevated brain natriuretic peptide (BNP) level 09/27/2018  . COPD (chronic obstructive pulmonary disease) ([place]) 09/27/2018  . Hyperlipidemia 09/27/2018  . Tobacco abuse 09/27/2018  . Non-small cell lung cancer, right ([place]) 08/26/2018   Past Medical History:  Past Medical History:  Diagnosis Date  . Combined congestive systolic and diastolic heart failure ([place])   . COPD (chronic obstructive pulmonary disease) ([place])   . Hypercholesteremia   . Lung cancer ([place]) 03/2016   Past Surgical History:  Past Surgical History:  Procedure Laterality Date  . Arm Surgery    . HERNIA REPAIR    . IR ANGIO INTRA EXTRACRAN SEL COM CAROTID INNOMINATE UNI L MOD SED  10/21/2018  . IR ANGIO VERTEBRAL SEL SUBCLAVIAN INNOMINATE UNI R MOD SED  10/21/2018  . IR CT HEAD LTD  10/21/2018  . IR PERCUTANEOUS ART THROMBECTOMY/INFUSION INTRACRANIAL INC DIAG ANGIO  10/21/2018  . IR THORACENTESIS ASP PLEURAL SPACE W/IMG GUIDE  09/27/2018  . RADIOLOGY WITH ANESTHESIA N/A 10/21/2018   Procedure: RADIOLOGY WITH ANESTHESIA;  Surgeon: Luanne Bras, MD;  Location: [facility];  Service: Radiology;  Laterality: N/A;  . REPLANTATION THUMB    . RIGHT/LEFT HEART CATH AND CORONARY ANGIOGRAPHY N/A 10/01/2018   Procedure: RIGHT/LEFT HEART CATH AND CORONARY ANGIOGRAPHY;  Surgeon: Troy Sine, MD;  Location: [facility] CV LAB;  Service:  Cardiovascular;  Laterality: N/A;   HPI:  Pt is a 68 y.o. M with significant PMH of stage III NSCLC (adenocarcinoma) s/p chemo 2018, COPD, ischemic cardiomyopathy with EF 15% who presents with left hemineglect and weakness. CTA head and neck reveals right M1 and M2 occlusion with acute thrombus in the distal right M1 segment leading into M2 branches bilaterally. MRI showing acute patchy right MCA infarct involving right temporal lobe, right insula, basal ganglia and lateral frontal lobe.   Assessment / Plan / Recommendation Clinical Impression  Patient presents with a mild cognitive-linguistic deficit characterized by decreased recall of new information, memory storage deficit, decreased initiation and ability to maintain attention and active participation in basic-mild complex problem solving tasks, emerging awareness but not anticipatory awareness, and decreased self-monitoring during conversation and tasks, with perseveration and repetition of statements made and no awareness that he was repeating himself. Patient did stated, ""right now, my memory is not all there", and after failing to initiate to complete tasks on MOCA, despite maximal instruction and prompting by SLP, patient stated "I dont think my brain is ready for this type of thing."     SLP Assessment  SLP Recommendation/Assessment: Patient needs continued Speech Lanaguage Pathology Services SLP Visit Diagnosis: Cognitive communication deficit (R41.841)    Follow Up Recommendations  Inpatient Rehab    Frequency and Duration min 2x/week  1 week      SLP Evaluation Cognition  Overall Cognitive Status: Impaired/Different from baseline Arousal/Alertness: Awake/alert Orientation Level: Oriented X4 Attention: Alternating Alternating Attention: Impaired Alternating Attention Impairment: Verbal complex;Functional complex Memory: Impaired Memory Impairment: Decreased  recall of new information;Storage deficit Awareness:  Impaired Awareness Impairment: Anticipatory impairment Problem Solving: Impaired Problem Solving Impairment: Functional complex;Verbal complex Executive Function: Self Monitoring;Initiating Initiating: Impaired Initiating Impairment: Functional complex Behaviors: Perseveration Safety/Judgment: Appears intact       Comprehension  Auditory Comprehension Overall Auditory Comprehension: Appears within functional limits for tasks assessed    Expression Expression Primary Mode of Expression: Verbal Verbal Expression Overall Verbal Expression: Appears within functional limits for tasks assessed Initiation: No impairment Repetition: No impairment Naming: No impairment   Oral / Motor  Oral Motor/Sensory Function Overall Oral Motor/Sensory Function: Within functional limits Motor Speech Overall Motor Speech: Appears within functional limits for tasks assessed   GO                    Dannial Monarch 10/24/2018, 4:56 PM  Sonia Baller, MA, CCC-SLP Speech Therapy [facility] Acute Rehab Pager: [phone]

## 2018-10-24 NOTE — Progress Notes (Signed)
NAME:  Jared Stevens, MRN:  [id number], DOB:  1951/05/07, LOS: 3 ADMISSION DATE:  10/21/2018, CONSULTATION DATE:  10/21/18 REFERRING MD:  Estanislado Pandy  CHIEF COMPLAINT:  Left sided weakness   Brief History   Jared Stevens is a 68 y.o. male who was admitted 5/25 with R MCA infarct s/p tPA administration and VIR revascularization. Post op, transferred to ICU where he remained on the ventilator.  Past Medical History  Stage III NSCLC (adenocarcinoma) s/p chemoradiation 2018, COPD, HLD. Chronic right effusion with negative cytology 5/4 & 4/9  Ischemic cardiomyopathy with EF 15%  Significant Hospital Events   5/25 > admit. 5/27 > extubated   Consults:  PCCM. IR.  Procedures:  ETT 5/25 >> 5/27 R fem sheath 5/25 >> 5/26  Significant Diagnostic Tests:  CT Head 5/25 > hyperdense R MCA, aspects 10. CTA / CTP head 5/25 > R M1 thrombus extending into M2 branches, R MCA infarct with 35ml core infarct and 176ml penumbra. CT head 5/26 > neg MRI brain 5/26 > Extensive acute infarction in the right MCA territory involving cortex and basal ganglia.  Anterior right frontal lobe encephalomalacia Echo 5/26 >   Micro Data:  SARS CoV 2 5/25 > negative. MRSA PCR 5/25 >> negative   Antimicrobials:  5/25 cefazolin x 1  Interim history/subjective:   Do well since extubation, eating, on room air currently  Objective:  Blood pressure 110/79, pulse 94, temperature (!) 97.3 F (36.3 C), temperature source Oral, resp. rate 20, height 6\' 1"  (1.854 m), weight 60.3 kg, SpO2 98 %.      Intake/Output Summary (Last 24 hours) at 10/24/2018 1497 Last data filed at 10/24/2018 0700 Gross per 24 hour  Intake 1179.3 ml  Output 2000 ml  Net -820.7 ml   Filed Weights   10/21/18 1730  Weight: 60.3 kg    Examination: General:  Elderly male sitting upright in bed eating fruit in NAD HEENT: MM pink/moist, poor dentition, right pupil 2, left 3/reactive Neuro: Awake, oriented, f/c moves  CV: rr, no mumur  PULM: even/non-labored, lungs bilaterally clear anteriorly, diminished in bases WY:OVZC, non-tender, bs active  Extremities: warm/dry, no edema, condom cath  Skin: no rashes   Assessment & Plan:   R MCA occlusion - s/p VIR revascularization 5/25 P:  Stroke workup / management per neuro  Acute respiratory insufficiency - resolved  Hx COPD per report (no PFT's in Epic). P:  Ongoing pulmonary hygiene PT/OT Appointment made for follow-up pulmonary outpt   Hx combined CHF(echo from May 2020 with EF 15%, impaired relaxation, severely reduced RV function and cardiac cath 10/01/18 with 100% stenosed prox to dist RCA Elevated troponin - EKG normal. P:  Resume coreg, lipitor, and lasix Trend BMP Will need cardiology/ HF outpatient follow-up    Hx stage III NSCLC (adenocarcinoma) s/p chemo / XRT in 2018 (followed by Dr. Earlie Server). - Last CT from 4/9 showed some nodularity but prior PET scan 04/2018 reviewed which did not show any hypermetabolism, hence under observation P:  Outpatient f/u with oncology- June 1 per patient    Hx right pleural effusion now with recurrence  - s/p thoracentesis 09/27/18 with 1.7L clear yellow fluid removed; transudative fluid. P:  Thoracentesis only if he develops respiratory distress and send for cytology/ cx, otherwise diuresis   Nothing further to add.  PCCM will sign off.  Please do not hesitate to call us back if we can be of any further assistance.   Best Practice:  Diet: NPO. Pain/Anxiety/Delirium protocol (  if indicated): d/c VAP protocol (if indicated): d/c DVT prophylaxis: SCD's. GI prophylaxis: Pantoprazole. Glucose control:  Mobility: advance as tolerated  Code Status: Full Family Communication: per stroke service Disposition: ICU

## 2018-10-24 NOTE — Progress Notes (Signed)
Physical Therapy Treatment Patient Details Name: Jared Stevens MRN: [id number] DOB: [birth year] Today's Date: 10/24/2018    History of Present Illness Pt is a 68 y.o. M with significant PMH of stage III NSCLC (adenocarcinoma) s/p chemo 2018, COPD, ischemic cardiomyopathy with EF 15% who presents with left hemineglect and weakness. CTA head and neck reveals right M1 and M2 occlusion with acute thrombus in the distal right M1 segment leading into M2 branches bilaterally. MRI showing acute patchy right MCA infarct involving right temporal lobe, right insula, basal ganglia and lateral frontal lobe.    PT Comments    Pt making steady progress towards his physical therapy goals. Noted improvement in exiting from the right side of the bed, due to left inattention/neglect. Pt ambulating x 120 feet with walker and min guard assist (+2 for safety). Focused on left head turns and way finding back to room in order to increase attention and awareness to left side. SpO2 > 90% on RA. Continue to recommend comprehensive inpatient rehab (CIR) for post-acute therapy needs.    Follow Up Recommendations  CIR;Supervision/Assistance - 24 hour     Equipment Recommendations  Rolling walker with 5" wheels    Recommendations for Other Services       Precautions / Restrictions Precautions Precautions: Fall Precaution Comments: L inattention/neglect Restrictions Weight Bearing Restrictions: No    Mobility  Bed Mobility Overal bed mobility: Needs Assistance Bed Mobility: Supine to Sit     Supine to sit: Min guard     General bed mobility comments: min guard for safety. progressed to long sitting position, then cues for bringing legs off edge of bed  Transfers Overall transfer level: Needs assistance Equipment used: None Transfers: Sit to/from Stand Sit to Stand: Min assist         General transfer comment: Min assist to stand from edge of bed  Ambulation/Gait Ambulation/Gait assistance: Min  assist;+2 safety/equipment Gait Distance (Feet): 120 Feet Assistive device: Rolling walker (2 wheeled) Gait Pattern/deviations: Step-through pattern;Narrow base of support;Trunk flexed;Decreased stride length Gait velocity: decreased   General Gait Details: Pt with crouched posture (increased trunk/hip/knee flexion), cues for upright posture. Chair follow utilized   Marine scientist Rankin (Stroke Patients Only) Modified Rankin (Stroke Patients Only) Pre-Morbid Rankin Score: No symptoms Modified Rankin: Moderately severe disability     Balance Overall balance assessment: Needs assistance Sitting-balance support: Feet supported Sitting balance-Leahy Scale: Fair     Standing balance support: Bilateral upper extremity supported Standing balance-Leahy Scale: Poor Standing balance comment: reliant on at least single UE support                            Cognition Arousal/Alertness: Awake/alert Behavior During Therapy: WFL for tasks assessed/performed Overall Cognitive Status: Impaired/Different from baseline Area of Impairment: Attention;Problem solving;Awareness;Safety/judgement;Following commands                   Current Attention Level: Sustained   Following Commands: Follows one step commands with increased time;Follows one step commands consistently Safety/Judgement: Decreased awareness of deficits Awareness: Intellectual Problem Solving: Slow processing;Decreased initiation;Difficulty sequencing;Requires tactile cues;Requires verbal cues General Comments: Pt still thinking he is in his prior hospital room in 2018 on the east side of the hospital      Exercises General Exercises - Lower Extremity Heel Slides: 10 reps;Both;Supine Straight Leg Raises: 10 reps;Both;Supine  General Comments        Pertinent Vitals/Pain Pain Assessment: Faces Faces Pain Scale: No hurt    Home Living                       Prior Function            PT Goals (current goals can now be found in the care plan section) Acute Rehab PT Goals Patient Stated Goal: "to eat a regular meal." Potential to Achieve Goals: Good Progress towards PT goals: Progressing toward goals    Frequency    Min 4X/week      PT Plan Current plan remains appropriate    Co-evaluation              AM-PAC PT "6 Clicks" Mobility   Outcome Measure  Help needed turning from your back to your side while in a flat bed without using bedrails?: None Help needed moving from lying on your back to sitting on the side of a flat bed without using bedrails?: A Little Help needed moving to and from a bed to a chair (including a wheelchair)?: A Little Help needed standing up from a chair using your arms (e.g., wheelchair or bedside chair)?: A Little Help needed to walk in hospital room?: A Little Help needed climbing 3-5 steps with a railing? : A Lot 6 Click Score: 18    End of Session Equipment Utilized During Treatment: Gait belt Activity Tolerance: Patient tolerated treatment well Patient left: in chair;with call bell/phone within reach;with chair alarm set Nurse Communication: Mobility status PT Visit Diagnosis: Other abnormalities of gait and mobility (R26.89);Unsteadiness on feet (R26.81);Other symptoms and signs involving the nervous system (R29.898)     Time: 9476-5465 PT Time Calculation (min) (ACUTE ONLY): 27 min  Charges:  $Gait Training: 8-22 mins $Therapeutic Activity: 8-22 mins                     Ellamae Sia, PT, DPT Acute Rehabilitation Services Pager [phone] Office [phone]    Willy Eddy 10/24/2018, 4:01 PM

## 2018-10-25 ENCOUNTER — Encounter (HOSPITAL_COMMUNITY): Payer: Self-pay | Admitting: *Deleted

## 2018-10-25 LAB — PROTIME-INR
INR: 1.2 (ref 0.8–1.2)
Prothrombin Time: 15.2 seconds (ref 11.4–15.2)

## 2018-10-25 MED ORDER — WARFARIN SODIUM 5 MG PO TABS
5.0000 mg | ORAL_TABLET | Freq: Once | ORAL | Status: AC
  Administered 2018-10-25: 5 mg via ORAL
  Filled 2018-10-25: qty 1

## 2018-10-25 NOTE — Progress Notes (Addendum)
Stroke Team Progress Note  SUBJECTIVE Blood pressure has been adequately controlled. He is following commands and working with rehab efforts. Stable and ready for d/c to CIR.    OBJECTIVE Most recent Vital Signs: Temp: 97.8 F (36.6 C) (05/29 0511) Temp Source: Oral (05/29 0511) BP: 120/80 (05/29 0511) Pulse Rate: 97 (05/29 0511) Respiratory Rate: 16 O2 Saturdation: 97%  CBG (last 3)  No results for input(s): GLUCAP in the last 72 hours.  Studies:  CTA head and neck reveals right M1 and M2 occlusion with acute thrombus in the distal right M1 segment extending into the M2 branches bilaterally. This is occlusive with minimal collateral flow and poor opacification of right MCA branches. 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 46 mL  MRI pending   ECHO 09/27/18 revealed estimated left ventricular ejection fraction of 15%. The cavity size was moderately dilated. Left ventricular diastolic Doppler parameters are consistent with impaired relaxation. The right ventricle had severely reduced systolic function. The cavity was normal. There is no increase in right ventricular wall thickness. Trivial pericardial effusion was present. LDL 94 mg percent HbA1c 5.7 MRI scan of the brain :  Acute patchy right MCA infarct involving right temporal lobe, right insula, basal ganglia and lateral frontal lobe.  Old encephalomalacia in anterior frontal lobe likely from posttraumatic etiology Physical Exam:    Frail middle-aged African-American male who is not in distress. Poor dentition and missing teeth. Afebrile. Head is nontraumatic. Neck is supple without bruit. Cardiac exam no murmur or gallop. Lungs are clear to auscultation. Distal pulses are well felt. Neurological Exam : Patient is awake alert and interactive. Speech is somewhat dysarthric, but could be d/t lack of teeth/poor dentition. No aphasia. Gaze is now midline, there is delay when looking to left noted. EOMI. PERRL  He has left lower facial  weakness. Tongue midline. Left grip is 4/5, with drift noted. No drift in leg, but gives way to push, 4+/5. Right is wnl. Sensation intact to LT throughout. Right plantars downgoing left is equivocal. ASSESSMENT [name] is a 68 y.o. 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 Etiology of stroke likely cardioembolic given known cardiomyopathy with ejection fraction of 15% on recent echo.  Etiology of cardiomyopathy most likely chemotherapy related from recent treatment for adenocarcinoma lung.   Hospital day # 4  TREATMENT/PLAN    Warfarin started on 5/28 for stroke prevention for his cardiomyopathy and will bridge with aspirin until INR is at goal of 2-3. Today's INR is 1.2. Rehab team to mobilize out of bed.   Transfer to CIR when bed avial. Will d/w Case mgt team as to timing. He is medically stable and ready for d/c today.   Treatment of his adenocarcinoma lung and chronic pleural effusion as per pulmonary can be f/u on as out pt. He will also need con't ONC care as out pt for underlying cancer tx.   Desiree Metzger-Cihelka, ARNP-C, ANVP-BC Pager: [phone]  I have personally obtained history,examined this patient, reviewed notes, independently viewed imaging studies, participated in medical decision making and plan of care.ROS completed by me personally and pertinent positives fully documented  I have made any additions or clarifications directly to the above note. Agree with note above.    Antony Contras, MD Medical Director [place] Pager: [phone] 10/25/2018 5:20 PM  10/25/2018 9:13 AM

## 2018-10-25 NOTE — Progress Notes (Signed)
ANTICOAGULATION CONSULT NOTE - Initial Consult  Pharmacy Consult for warfarin Indication: cardioembolic stroke  No Known Allergies  Patient Measurements: Height: 6\' 1"  (185.4 cm) Weight: 133 lb (60.3 kg) IBW/kg (Calculated) : 79.9  Vital Signs: Temp: 97.8 F (36.6 C) (05/29 0511) Temp Source: Oral (05/29 0511) BP: 120/80 (05/29 0511) Pulse Rate: 97 (05/29 0511)  Labs: Recent Labs    10/22/18 1208 10/23/18 0323 10/24/18 0539 10/25/18 0157  HGB  --  14.5 12.6*  --   HCT  --  42.8 38.1*  --   PLT  --  325 267  --   LABPROT  --   --   --  15.2  INR  --   --   --  1.2  CREATININE  --  1.09 0.93  --   TROPONINI 0.11*  --   --   --    Estimated Creatinine Clearance: 65.7 mL/min (by C-G formula based on SCr of 0.93 mg/dL).  Medical History: Past Medical History:  Diagnosis Date  . Combined congestive systolic and diastolic heart failure ([place])   . COPD (chronic obstructive pulmonary disease) ([place])   . Hypercholesteremia   . Lung cancer ([place]) 03/2016   Assessment: Jared Stevens is a 68yo male admitted with R MCA infarct s/p tpA and thrombectomy on 5/25. Patient with EF 15% with impaired relaxation and severely reduced RV function. Pharmacy consulted to start warfarin.    Data from a sub-analysis of the WARCEF trial in 2014 supports stroke risk reduction with warfarin in HF patients with prior stroke and severely reduced EF.   Goal of Therapy:  INR 2-3 Monitor platelets by anticoagulation protocol: Yes   Plan:  Warfarin 5mg  today at 1800 Daily INR F/u plans for echo?  Thank you for involving pharmacy in this patient's care.  Minda Ditto PharmD Phone: [phone] 10/25/2018 8:28 AM

## 2018-10-25 NOTE — Progress Notes (Signed)
Inpatient Rehab Admissions Coordinator:    I was able to speak with pt's daughter.  She reports that she is not able to have patient live with her and there is no family who would be able to stay with patient at discharge.  He will need to pursue SNF for longer term rehab.  I will let CM know and sign off.   Shann Medal, PT, DPT Admissions Coordinator [phone] 10/25/18  12:24 PM

## 2018-10-25 NOTE — Progress Notes (Signed)
1055 Okay to leave PICC in for patient to transfer to Rehab Per Dr. Leonie Man.

## 2018-10-25 NOTE — Progress Notes (Signed)
Occupational Therapy Treatment Patient Details Name: Jared Stevens MRN: [id number] DOB: Aug 14, 1950 Today's Date: 10/25/2018    History of present illness Pt is a 68 y.o. M with significant PMH of stage III NSCLC (adenocarcinoma) s/p chemo 2018, COPD, ischemic cardiomyopathy with EF 15% who presents with left hemineglect and weakness. CTA head and neck reveals right M1 and M2 occlusion with acute thrombus in the distal right M1 segment leading into M2 branches bilaterally. MRI showing acute patchy right MCA infarct involving right temporal lobe, right insula, basal ganglia and lateral frontal lobe.   OT comments  Pt participated in grooming and feeding tasks in recliner during session. Requires cues to scan to left side and locate objects on left. Demonstrates good functional use of left UE. He is easily distracted and requires repeated requests for one step directions. Updating discharge recommendation to SNF. No longer candidate for CIR since there will not be adequate caregiver support/assist at home. Will continue to follow acutely.   Follow Up Recommendations  Supervision/Assistance - 24 hour;SNF    Equipment Recommendations  3 in 1 bedside commode    Recommendations for Other Services      Precautions / Restrictions Precautions Precautions: Fall Precaution Comments: L inattention/neglect       Mobility Bed Mobility               General bed mobility comments: pt received up in chair  Transfers                 General transfer comment: did not perform transfers today    Balance                                           ADL either performed or assessed with clinical judgement   ADL Overall ADL's : Needs assistance/impaired Eating/Feeding: Set up(cues for scanning to left side) Eating/Feeding Details (indicate cue type and reason): Pt requiring verbal cues to scan table and locate items on left side of tray. Pt using left UE functionally to  reach for objects. Assist to open small packages.  Grooming: Oral care;Wash/dry hands;Wash/dry face;Set up;Sitting;Cueing for sequencing(cues for scanning to left side) Grooming Details (indicate cue type and reason): Cues for sequencing oral care. Cues to locate grooming objects on left side 50% of time time.                                General ADL Comments: Pt received up in chair. Completed grooming tasks in chair. Meal tray arrived and pt ate meal with set up assist/cues for locating objects on table.      Vision   Additional Comments: Pt reports baseline vision with glasses. Attempted vision assessment but pt distracted conversationally.   Perception     Praxis      Cognition Arousal/Alertness: Awake/alert Behavior During Therapy: WFL for tasks assessed/performed Overall Cognitive Status: Impaired/Different from baseline Area of Impairment: Attention;Problem solving;Awareness;Safety/judgement;Following commands                 Orientation Level: Disoriented to;Time;Situation Current Attention Level: Sustained   Following Commands: Follows one step commands with increased time;Follows multi-step commands inconsistently Safety/Judgement: Decreased awareness of deficits Awareness: Intellectual Problem Solving: Slow processing;Decreased initiation;Requires verbal cues General Comments: Requires multiple requests for one step directions. Gets off track easily with conversation.  Exercises     Shoulder Instructions       General Comments      Pertinent Vitals/ Pain       Pain Assessment: No/denies pain  Home Living                                          Prior Functioning/Environment              Frequency  Min 3X/week        Progress Toward Goals  OT Goals(current goals can now be found in the care plan section)  Progress towards OT goals: Progressing toward goals  Acute Rehab OT Goals Patient Stated  Goal: "to eat a regular meal." OT Goal Formulation: With patient Time For Goal Achievement: 11/06/18 Potential to Achieve Goals: Good ADL Goals Pt Will Perform Grooming: with supervision;standing Pt Will Perform Upper Body Bathing: with set-up;with supervision;sitting Pt Will Perform Lower Body Dressing: with supervision;sit to/from stand Pt Will Transfer to Toilet: with supervision;ambulating;bedside commode Additional ADL Goal #1: Pt will follow 2 step commands with 90% accuracy in order to maximize participation in ADLs. Additional ADL Goal #2: Patient will attend to L side of body/environment with no more than minimal cueing during self care routine.  Plan Frequency remains appropriate;Discharge plan needs to be updated    Co-evaluation                 AM-PAC OT "6 Clicks" Daily Activity     Outcome Measure   Help from another person eating meals?: A Little Help from another person taking care of personal grooming?: A Little Help from another person toileting, which includes using toliet, bedpan, or urinal?: A Lot Help from another person bathing (including washing, rinsing, drying)?: A Lot Help from another person to put on and taking off regular upper body clothing?: A Little Help from another person to put on and taking off regular lower body clothing?: A Lot 6 Click Score: 15    End of Session    OT Visit Diagnosis: Other abnormalities of gait and mobility (R26.89);Other symptoms and signs involving cognitive function;Cognitive communication deficit (R41.841);Other symptoms and signs involving the nervous system (R29.898) Symptoms and signs involving cognitive functions: Cerebral infarction   Activity Tolerance Patient tolerated treatment well   Patient Left with call bell/phone within reach;in chair;with chair alarm set   Nurse Communication          Time: [phone] OT Time Calculation (min): 20 min  Charges: OT General Charges $OT Visit: 1 Visit OT  Treatments $Self Care/Home Management : 8-22 mins     Darrol Jump OTR/L 10/25/2018, 1:28 PM

## 2018-10-25 NOTE — Progress Notes (Signed)
Physical Therapy Treatment Patient Details Name: Jared Stevens MRN: [id number] DOB: 04-07-1951 Today's Date: 10/25/2018    History of Present Illness Pt is a 68 y.o. M with significant PMH of stage III NSCLC (adenocarcinoma) s/p chemo 2018, COPD, ischemic cardiomyopathy with EF 15% who presents with left hemineglect and weakness. CTA head and neck reveals right M1 and M2 occlusion with acute thrombus in the distal right M1 segment leading into M2 branches bilaterally. MRI showing acute patchy right MCA infarct involving right temporal lobe, right insula, basal ganglia and lateral frontal lobe.    PT Comments    Patient improving tolerance to activity, gait distance, level of assistance needed. Ambulating with min A without RW, poor L attention and awareness at times bumping into items. Cont to rec post acute rehab, noted in chart pt unable to have support at home required for CIR, updated recs to SNF.      Follow Up Recommendations  Supervision/Assistance - 24 hour;SNF     Equipment Recommendations  Rolling walker with 5" wheels    Recommendations for Other Services Rehab consult     Precautions / Restrictions Precautions Precautions: Fall Precaution Comments: L inattention/neglect    Mobility  Bed Mobility               General bed mobility comments: pt received up in chair  Transfers                 General transfer comment: did not perform transfers today  Ambulation/Gait Ambulation/Gait assistance: Min guard Gait Distance (Feet): 200 Feet Assistive device: None Gait Pattern/deviations: Step-through pattern;Narrow base of support;Trunk flexed;Decreased stride length Gait velocity: decreased   General Gait Details: pt ambulating with unsteadiness, min guard with sevreal staggering and LOB noted. asked patient to find his way back to room with signs, able to get in riht direction but forogt his room number   Stairs             Wheelchair  Mobility    Modified Rankin (Stroke Patients Only) Modified Rankin (Stroke Patients Only) Pre-Morbid Rankin Score: No symptoms Modified Rankin: Moderately severe disability     Balance Overall balance assessment: Needs assistance Sitting-balance support: Feet supported Sitting balance-Leahy Scale: Fair       Standing balance-Leahy Scale: Poor                              Cognition Arousal/Alertness: Awake/alert Behavior During Therapy: WFL for tasks assessed/performed Overall Cognitive Status: Impaired/Different from baseline Area of Impairment: Attention;Problem solving;Awareness;Safety/judgement;Following commands                 Orientation Level: Disoriented to;Time;Situation Current Attention Level: Sustained   Following Commands: Follows one step commands with increased time;Follows multi-step commands inconsistently Safety/Judgement: Decreased awareness of deficits Awareness: Intellectual Problem Solving: Slow processing;Decreased initiation;Requires verbal cues General Comments: Requires multiple requests for one step directions. Gets off track easily with conversation.      Exercises      General Comments        Pertinent Vitals/Pain Pain Assessment: No/denies pain    Home Living                      Prior Function            PT Goals (current goals can now be found in the care plan section) Acute Rehab PT Goals Patient Stated Goal: "to eat a regular meal."  PT Goal Formulation: With patient Time For Goal Achievement: 11/06/18 Potential to Achieve Goals: Good Progress towards PT goals: Progressing toward goals    Frequency    Min 4X/week      PT Plan Current plan remains appropriate    Co-evaluation              AM-PAC PT "6 Clicks" Mobility   Outcome Measure  Help needed turning from your back to your side while in a flat bed without using bedrails?: None Help needed moving from lying on your back to  sitting on the side of a flat bed without using bedrails?: A Little Help needed moving to and from a bed to a chair (including a wheelchair)?: A Little Help needed standing up from a chair using your arms (e.g., wheelchair or bedside chair)?: A Little Help needed to walk in hospital room?: A Little   6 Click Score: 16    End of Session Equipment Utilized During Treatment: Gait belt Activity Tolerance: Patient tolerated treatment well Patient left: in chair;with call bell/phone within reach;with chair alarm set Nurse Communication: Mobility status PT Visit Diagnosis: Other abnormalities of gait and mobility (R26.89);Unsteadiness on feet (R26.81);Other symptoms and signs involving the nervous system (Q33.354)     Time: 5625-6389 PT Time Calculation (min) (ACUTE ONLY): 17 min  Charges:  $Gait Training: 8-22 mins                     Reinaldo Berber, PT, DPT Acute Rehabilitation Services Pager: [phone] Office: [place] 10/25/2018, 6:38 PM

## 2018-10-26 ENCOUNTER — Encounter (HOSPITAL_COMMUNITY): Payer: Self-pay

## 2018-10-26 DIAGNOSIS — Z85118 Personal history of other malignant neoplasm of bronchus and lung: Secondary | ICD-10-CM

## 2018-10-26 DIAGNOSIS — I255 Ischemic cardiomyopathy: Secondary | ICD-10-CM

## 2018-10-26 DIAGNOSIS — I63311 Cerebral infarction due to thrombosis of right middle cerebral artery: Secondary | ICD-10-CM

## 2018-10-26 DIAGNOSIS — E876 Hypokalemia: Secondary | ICD-10-CM

## 2018-10-26 LAB — CBC
HCT: 42.2 % (ref 39.0–52.0)
Hemoglobin: 14.2 g/dL (ref 13.0–17.0)
MCH: 29.3 pg (ref 26.0–34.0)
MCHC: 33.6 g/dL (ref 30.0–36.0)
MCV: 87 fL (ref 80.0–100.0)
Platelets: 297 10*3/uL (ref 150–400)
RBC: 4.85 MIL/uL (ref 4.22–5.81)
RDW: 14.6 % (ref 11.5–15.5)
WBC: 6.7 10*3/uL (ref 4.0–10.5)
nRBC: 0 % (ref 0.0–0.2)

## 2018-10-26 LAB — BASIC METABOLIC PANEL
Anion gap: 10 (ref 5–15)
BUN: 14 mg/dL (ref 8–23)
CO2: 28 mmol/L (ref 22–32)
Calcium: 8.8 mg/dL — ABNORMAL LOW (ref 8.9–10.3)
Chloride: 100 mmol/L (ref 98–111)
Creatinine, Ser: 0.82 mg/dL (ref 0.61–1.24)
GFR calc Af Amer: >60 mL/min (ref >60)
GFR calc non Af Amer: >60 mL/min (ref >60)
Glucose, Bld: 91 mg/dL (ref 70–99)
Potassium: 3.4 mmol/L — ABNORMAL LOW (ref 3.5–5.1)
Sodium: 138 mmol/L (ref 135–145)

## 2018-10-26 LAB — PROTIME-INR
INR: 1.3 — ABNORMAL HIGH (ref 0.8–1.2)
Prothrombin Time: 16.4 seconds — ABNORMAL HIGH (ref 11.4–15.2)

## 2018-10-26 MED ORDER — POTASSIUM CHLORIDE CRYS ER 20 MEQ PO TBCR
20.0000 meq | EXTENDED_RELEASE_TABLET | Freq: Three times a day (TID) | ORAL | Status: DC
  Administered 2018-10-26 – 2018-10-29 (×8): 20 meq via ORAL
  Filled 2018-10-26 (×10): qty 1

## 2018-10-26 MED ORDER — WARFARIN SODIUM 7.5 MG PO TABS
7.5000 mg | ORAL_TABLET | Freq: Once | ORAL | Status: AC
  Administered 2018-10-26: 7.5 mg via ORAL
  Filled 2018-10-26: qty 1

## 2018-10-26 NOTE — Progress Notes (Signed)
ANTICOAGULATION CONSULT NOTE   Pharmacy Consult for warfarin Indication: cardioembolic stroke  No Known Allergies  Patient Measurements: Height: 6\' 1"  (185.4 cm) Weight: 133 lb (60.3 kg) IBW/kg (Calculated) : 79.9  Vital Signs: Temp: 97.8 F (36.6 C) (05/30 0441) Temp Source: Oral (05/30 0441) BP: 124/98 (05/30 0441) Pulse Rate: 49 (05/30 0441)  Labs: Recent Labs    10/24/18 0539 10/25/18 0157 10/26/18 0315  HGB 12.6*  --  14.2  HCT 38.1*  --  42.2  PLT 267  --  297  LABPROT  --  15.2 16.4*  INR  --  1.2 1.3*  CREATININE 0.93  --  0.82   Estimated Creatinine Clearance: 74.6 mL/min (by C-G formula based on SCr of 0.82 mg/dL).  Medical History: Past Medical History:  Diagnosis Date  . Combined congestive systolic and diastolic heart failure ([place])   . COPD (chronic obstructive pulmonary disease) ([place])   . Hypercholesteremia   . Lung cancer ([place]) 03/2016   Assessment: Jared Stevens is a 68yo male admitted with R MCA infarct s/p tpA and thrombectomy on 5/25. Patient with EF 15% with impaired relaxation and severely reduced RV function. Pharmacy consulted to start warfarin.    Data from a sub-analysis of the WARCEF trial in 2014 supports stroke risk reduction with warfarin in HF patients with prior stroke and severely reduced EF.   Goal of Therapy:  INR 2-3 Monitor platelets by anticoagulation protocol: Yes   Today, 10/26/2018 INR 1.3 after 2 x 5mg  Warfarin   Plan:  Warfarin 7.5mg  today at 1800 Daily INR F/u plans for echo?  Thank you for involving pharmacy in this patient's care.  Minda Ditto PharmD Phone: [phone] 10/26/2018 9:06 AM

## 2018-10-26 NOTE — Progress Notes (Addendum)
STROKE TEAM PROGRESS NOTE   SUBJECTIVE (INTERVAL HISTORY) No family is at the bedside.  Patient sitting in chair, no acute abnormality.  He stated that he was in good health except lung cancer, denies any heart condition in the past.  Currently left-sided weakness much improved and speech near baseline.  Pending SNF   OBJECTIVE Vitals:   10/25/18 1442 10/25/18 2147 10/26/18 0441 10/26/18 1430  BP: 92/73 106/83 (!) 124/98 90/67  Pulse: 94 95 (!) 49 (!) 51  Resp:  20 16 16   Temp:  97.9 F (36.6 C) 97.8 F (36.6 C) (!) 97.3 F (36.3 C)  TempSrc:  Oral Oral Oral  SpO2:  97% 97% 96%  Weight:      Height:        CBC:  Recent Labs  Lab 10/21/18 1729  10/22/18 0425  10/24/18 0539 10/26/18 0315  WBC 6.1  --  9.1   < > 7.0 6.7  NEUTROABS 3.8  --  7.0  --   --   --   HGB 14.7   < > 14.5   < > 12.6* 14.2  HCT 44.5   < > 42.4   < > 38.1* 42.2  MCV 88.5  --  86.2   < > 87.8 87.0  PLT 333  --  352   < > 267 297   < > = values in this interval not displayed.    Basic Metabolic Panel:  Recent Labs  Lab 10/22/18 0425 10/23/18 0323 10/24/18 0539 10/26/18 0315  NA 143 145 143 138  K 3.6 3.4* 3.1* 3.4*  CL 108 111 108 100  CO2 22 24 27 28   GLUCOSE 92 83 95 91  BUN 12 13 19 14   CREATININE 0.97 1.09 0.93 0.82  CALCIUM 9.0 8.5* 8.1* 8.8*  MG 1.7 1.7 1.8  --   PHOS 3.2  --  2.8  --     Lipid Panel:     Component Value Date/Time   CHOL 162 10/22/2018 0425   TRIG 118 10/23/2018 0323   HDL 51 10/22/2018 0425   CHOLHDL 3.2 10/22/2018 0425   VLDL 17 10/22/2018 0425   LDLCALC 94 10/22/2018 0425   HgbA1c:  Lab Results  Component Value Date   HGBA1C 5.7 (H) 10/22/2018   Urine Drug Screen:     Component Value Date/Time   LABOPIA NONE DETECTED 10/21/2018 1816   COCAINSCRNUR NONE DETECTED 10/21/2018 1816   LABBENZ POSITIVE (A) 10/21/2018 1816   AMPHETMU NONE DETECTED 10/21/2018 1816   THCU NONE DETECTED 10/21/2018 1816   LABBARB NONE DETECTED 10/21/2018 1816    Alcohol  Level     Component Value Date/Time   ETH <10 10/21/2018 1727    IMAGING  CTA head and neck - reveals right M1 and M2 occlusionwith acute thrombusin thedistal right M1 segment extending into the M2 branches bilaterally. This is occlusive with minimal collateral flow and poor opacification of right MCA branches. 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 46 mL   MRI scan of the brain :  Acute patchy right MCA infarct involving right temporal lobe, right insula, basal ganglia and lateral frontal lobe.  Old encephalomalacia in anterior frontal lobe likely from posttraumatic etiology   Interventional Radiology IMPRESSION: Status post endovascular revascularization of occluded right middle cerebral artery distal M1 segment, achieving TICI 2b revascularization, with 2 passes with the 5 mm x 33 mm Embotrap retrieval device.  ECHO  09/27/18 revealed estimatedleft ventricularejection fraction of 15%. The cavity  size was moderately dilated. Left ventricular diastolic Doppler parameters are consistent with impaired relaxation. The right ventricle hadseverely reduced systolic function. The cavity was normal. There is no increase in right ventricular wall thickness. Trivial pericardial effusion waspresent.   PHYSICAL EXAM Blood pressure 90/67, pulse (!) 51, temperature (!) 97.3 F (36.3 C), temperature source Oral, resp. rate 16, height 6\' 1"  (1.854 m), weight 60.3 kg, SpO2 96 %.  Physical Exam:    Frail middle-aged African-American male who is not in distress. Poor dentition and missing teeth. Afebrile. Head is nontraumatic. Neck is supple without bruit. Cardiac exam no murmur or gallop. Lungs are clear to auscultation. Distal pulses are well felt. Neurological Exam : Patient is awake alert and interactive. Speech is mildly dysarthric, but could be d/t lack of teeth/poor dentition. No aphasia.  No gaze deviation, visual field full. EOMI. PERRL. He has mild left lower facial weakness. Tongue  midline. BUE 4+/5 without drift noted. No drift in BLEs 4+/5. Sensation intact to LT throughout. Right plantars downgoing left is equivocal.  Bilateral FTN intact.   ASSESSMENT/PLAN [name] is a 68 y.o. male with history of ongoing tobacco use, COPD, CHF, cardiomyopathy (EF 15%), lung cancer, hx of substance abuse, and Hld presenting with acute onset of left sided weakness with hemineglect, left facial droop, confusion and dysphasia after falling in bathroom.   Stroke: Right MCA infarct secondary to right M1 occlusion treated with IV TPA and IR with TICI2b reperfusion - cardioembolic given EF 03%.  Etiology of cardiomyopathy could be due to radiation therapy of lung cancer.   Resultant near baseline  CT head - No acute intracranial pathology. No CT evidence of acute stroke or hemorrhage.   CTA H&N - right M1 and M2 occlusionwith acute thrombusin thedistal right M1 segment extending into the M2 branches bilaterally.  CTP positive for penumbra  IR with TICI 2b reperfusion  MRI head - Acute patchy right MCA infarct involving right temporal lobe, right insula, basal ganglia and lateral frontal lobe.  2D Echo - EF 15%  [employer] Virus 2  - negative  INR -1.2-1.3  LDL - 94  HgbA1c - 5.7  UDS - benzodiazepines  VTE prophylaxis - SCDs  aspirin 81 mg daily prior to admission, now on aspirin 325 mg daily and warfarin daily.  Once INR at goal 2-3, aspirin can be discontinued.  Patient counseled to be compliant with his antithrombotic medications  Ongoing aggressive stroke risk factor management  Therapy recommendations:  SNF   Disposition:  Pending  Cardiomyopathy  EF 15% on 09/27/2018  Likely the cause of current stroke  On Coumadin with INR goal 2-3  Aspirin bridge  Follow-up with cardiology  Hypotension  BP runs low due to cardiomyopathy . On Coreg and Lasix per cardiology . Long-term BP goal normotensive . Close monitoring  Hyperlipidemia  Lipid  lowering medication PTA:  none  LDL 94, goal < 70  Current lipid lowering medication: Lipitor 20 mg daily  Continue statin at discharge  Tobacco abuse  Current smoker  Smoking cessation counseling provided  Pt is willing to quit  Other Stroke Risk Factors  Advanced age  Other Active Problems  Hypokalemia 3.4 -supplement  History lung cancer status post surgery and radiation in 2018   Hospital day # 5  Rosalin Hawking, MD PhD Stroke Neurology 10/26/2018 8:00 PM   To contact Stroke Continuity provider, please refer to [email or web address] After hours, contact General Neurology

## 2018-10-27 ENCOUNTER — Encounter (HOSPITAL_COMMUNITY): Payer: Self-pay

## 2018-10-27 DIAGNOSIS — E785 Hyperlipidemia, unspecified: Secondary | ICD-10-CM

## 2018-10-27 DIAGNOSIS — I5022 Chronic systolic (congestive) heart failure: Secondary | ICD-10-CM

## 2018-10-27 DIAGNOSIS — I472 Ventricular tachycardia: Secondary | ICD-10-CM

## 2018-10-27 LAB — CBC
HCT: 43 % (ref 39.0–52.0)
Hemoglobin: 14.6 g/dL (ref 13.0–17.0)
MCH: 29.4 pg (ref 26.0–34.0)
MCHC: 34 g/dL (ref 30.0–36.0)
MCV: 86.5 fL (ref 80.0–100.0)
Platelets: 314 10*3/uL (ref 150–400)
RBC: 4.97 MIL/uL (ref 4.22–5.81)
RDW: 14.6 % (ref 11.5–15.5)
WBC: 6.2 10*3/uL (ref 4.0–10.5)
nRBC: 0 % (ref 0.0–0.2)

## 2018-10-27 LAB — BASIC METABOLIC PANEL
Anion gap: 10 (ref 5–15)
BUN: 20 mg/dL (ref 8–23)
CO2: 28 mmol/L (ref 22–32)
Calcium: 8.8 mg/dL — ABNORMAL LOW (ref 8.9–10.3)
Chloride: 100 mmol/L (ref 98–111)
Creatinine, Ser: 0.85 mg/dL (ref 0.61–1.24)
GFR calc Af Amer: >60 mL/min (ref >60)
GFR calc non Af Amer: >60 mL/min (ref >60)
Glucose, Bld: 90 mg/dL (ref 70–99)
Potassium: 3.7 mmol/L (ref 3.5–5.1)
Sodium: 138 mmol/L (ref 135–145)

## 2018-10-27 LAB — PROTIME-INR
INR: 1.5 — ABNORMAL HIGH (ref 0.8–1.2)
Prothrombin Time: 18.3 seconds — ABNORMAL HIGH (ref 11.4–15.2)

## 2018-10-27 LAB — MAGNESIUM: Magnesium: 2 mg/dL (ref 1.7–2.4)

## 2018-10-27 MED ORDER — WARFARIN VIDEO
Freq: Once | Status: AC
  Administered 2018-10-27: 13:00:00

## 2018-10-27 MED ORDER — WARFARIN SODIUM 5 MG PO TABS: 10.0000 mg | ORAL_TABLET | Freq: Once | ORAL | Status: DC

## 2018-10-27 MED ORDER — WARFARIN SODIUM 5 MG PO TABS
10.0000 mg | ORAL_TABLET | Freq: Once | ORAL | Status: AC
  Administered 2018-10-27: 10 mg via ORAL
  Filled 2018-10-27: qty 2

## 2018-10-27 MED ORDER — SPIRONOLACTONE 12.5 MG HALF TABLET
12.5000 mg | ORAL_TABLET | Freq: Every day | ORAL | Status: DC
  Administered 2018-10-27 – 2018-10-29 (×2): 12.5 mg via ORAL
  Filled 2018-10-27 (×3): qty 1

## 2018-10-27 MED ORDER — COUMADIN BOOK
Freq: Once | Status: AC
  Administered 2018-10-27: 13:00:00
  Filled 2018-10-27: qty 1

## 2018-10-27 MED ORDER — DIGOXIN 125 MCG PO TABS
0.1250 mg | ORAL_TABLET | Freq: Every day | ORAL | Status: DC
  Administered 2018-10-27 – 2018-10-29 (×2): 0.125 mg via ORAL
  Filled 2018-10-27 (×3): qty 1

## 2018-10-27 MED ORDER — FUROSEMIDE 40 MG PO TABS
40.0000 mg | ORAL_TABLET | Freq: Every day | ORAL | Status: DC
  Administered 2018-10-29: 40 mg via ORAL
  Filled 2018-10-27 (×2): qty 1

## 2018-10-27 MED ORDER — WARFARIN - PHARMACIST DOSING INPATIENT: Freq: Every day | Status: DC

## 2018-10-27 MED ORDER — WARFARIN SODIUM 7.5 MG PO TABS: 7.5000 mg | ORAL_TABLET | Freq: Once | ORAL | Status: DC

## 2018-10-27 NOTE — Discharge Instructions (Addendum)
warfarin daily for secondary stroke prevention, check INR regularly at SNF, need to check INR on 10/31/18. INR goal 2-3.  Ongoing stroke risk factor control by Primary Care Physician at time of discharge  Follow-up PCP in SNF in 2 weeks.  Follow-up in [facility] Dr. Leonie Man in 4 weeks, office to schedule an appointment.   Follow up with Dr. Acie Fredrickson cardiology in 2-3 weeks  Follow up with Dr. Estanislado Pandy Interventional Radiology in 4 weeks.  Compliant with medication  Avoid low BP  Heart healthy diet with thin liquid  Information on my medicine - Coumadin   (Warfarin)  This medication education was reviewed with me or my healthcare representative as part of my discharge preparation.  The pharmacist that spoke with me during my hospital stay was:  Minda Ditto, [facility]  Why was Coumadin prescribed for you? Coumadin was prescribed for you because you have a blood clot or a medical condition that can cause an increased risk of forming blood clots. Blood clots can cause serious health problems by blocking the flow of blood to the heart, lung, or brain. Coumadin can prevent harmful blood clots from forming. 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 While on Coumadin (warfarin) you will need to have an INR test regularly to ensure that your dose is keeping you in the desired range. The INR (international normalized ratio) number is calculated from the result of the laboratory test called prothrombin time (PT).  If an INR APPOINTMENT HAS NOT ALREADY BEEN MADE FOR YOU please schedule an appointment to have this lab work done by your health care provider within 7 days. Your INR goal is usually a number between:  2 to 3 or your provider may give you a more narrow range like 2-2.5.  Ask your health care provider during an office visit what your goal INR is.  What  do you need to   know  About  COUMADIN? Take Coumadin (warfarin) exactly as prescribed by your healthcare provider about the same time each day.  DO NOT stop taking without talking to the doctor who prescribed the medication.  Stopping without other blood clot prevention medication to take the place of Coumadin may increase your risk of developing a new clot or stroke.  Get refills before you run out.  What do you do if you miss a dose? If you miss a dose, take it as soon as you remember on the same day then continue your regularly scheduled regimen the next day.  Do not take two doses of Coumadin at the same time.  Important Safety Information A possible side effect of Coumadin (Warfarin) is an increased risk of bleeding. You should call your healthcare provider right away if you experience any of the following: ? Bleeding from an injury or your nose that does not stop. ? Unusual colored urine (red or dark brown) or unusual colored stools (red or black). ? Unusual bruising for unknown reasons. ? A serious fall or if you hit your head (even if there is no bleeding).  Some foods or medicines interact with Coumadin (warfarin) and might alter your response to warfarin. To help avoid this: ? Eat a balanced diet, maintaining a consistent amount of Vitamin K. ? Notify your provider about major diet changes you plan to make. ? Avoid alcohol or limit your intake to 1 drink for women and  2 drinks for men per day. (1 drink is 5 oz. wine, 12 oz. beer, or 1.5 oz. liquor.)  Make sure that ANY health care provider who prescribes medication for you knows that you are taking Coumadin (warfarin).  Also make sure the healthcare provider who is monitoring your Coumadin knows when you have started a new medication including herbals and non-prescription products.  Coumadin (Warfarin)  Major Drug Interactions  Increased Warfarin Effect Decreased Warfarin Effect  Alcohol (large quantities) Antibiotics (esp. Septra/Bactrim,  Flagyl, Cipro) Amiodarone (Cordarone) Aspirin (ASA) Cimetidine (Tagamet) Megestrol (Megace) NSAIDs (ibuprofen, naproxen, etc.) Piroxicam (Feldene) Propafenone (Rythmol SR) Propranolol (Inderal) Isoniazid (INH) Posaconazole (Noxafil) Barbiturates (Phenobarbital) Carbamazepine (Tegretol) Chlordiazepoxide (Librium) Cholestyramine (Questran) Griseofulvin Oral Contraceptives Rifampin Sucralfate (Carafate) Vitamin K   Coumadin (Warfarin) Major Herbal Interactions  Increased Warfarin Effect Decreased Warfarin Effect  Garlic Ginseng Ginkgo biloba Coenzyme Q10 Green tea St. Johns wort    Coumadin (Warfarin) FOOD Interactions  Eat a consistent number of servings per week of foods HIGH in Vitamin K (1 serving =  cup)  Collards (cooked, or boiled & drained) Kale (cooked, or boiled & drained) Mustard greens (cooked, or boiled & drained) Parsley *serving size only =  cup Spinach (cooked, or boiled & drained) Swiss chard (cooked, or boiled & drained) Turnip greens (cooked, or boiled & drained)  Eat a consistent number of servings per week of foods MEDIUM-HIGH in Vitamin K (1 serving = 1 cup)  Asparagus (cooked, or boiled & drained) Broccoli (cooked, boiled & drained, or raw & chopped) Brussel sprouts (cooked, or boiled & drained) *serving size only =  cup Lettuce, raw (green leaf, endive, romaine) Spinach, raw Turnip greens, raw & chopped   These websites have more information on Coumadin (warfarin):  FailFactory.se; VeganReport.com.au;    Ischemic Stroke  An ischemic stroke (cerebrovascular accident, or CVA) is the sudden death of brain tissue that occurs when an area of the brain does not get enough oxygen. It is a medical emergency that must be treated right away. An ischemic stroke can cause permanent loss of brain function. This can cause problems with how different parts of your body function. What are the causes? This condition is caused by a  decrease of oxygen supply to an area of the brain, which may be the result of:  A small blood clot (embolus) or a buildup of plaque in the blood vessels (atherosclerosis) that blocks blood flow in the brain.  An abnormal heart rhythm (atrial fibrillation).  A blocked or damaged artery in the head or neck. Sometimes the cause of stroke is not known (cryptogenic). What increases the risk? Certain factors may make you more likely to develop this condition. Some of these factors are things that you can change, such as:  Obesity.  Smoking cigarettes.  Taking oral birth control, especially if you also use tobacco.  Physical inactivity.  Excessive alcohol use.  Use of illegal drugs, especially cocaine and methamphetamine. Other risk factors include:  High blood pressure (hypertension).  High cholesterol.  Diabetes mellitus.  Heart disease.  Being [place] American, Native American, Hispanic, or [place] Native.  Being over age 24.  Family history of stroke.  Previous history of blood clots, stroke, or transient ischemic attack (TIA).  Sickle cell disease.  Being a woman with a history of preeclampsia.  Migraine headache.  Sleep apnea.  Irregular heartbeats, such as atrial fibrillation.  Chronic inflammatory diseases, such as rheumatoid arthritis or lupus.  Blood clotting disorders (hypercoagulable state). What are the signs  or symptoms? Symptoms of this condition usually develop suddenly, or you may notice them after waking up from sleep. Symptoms may include sudden:  Weakness or numbness in your face, arm, or leg, especially on one side of your body.  Trouble walking or difficulty moving your arms or legs.  Loss of balance or coordination.  Confusion.  Slurred speech (dysarthria).  Trouble speaking, understanding speech, or both (aphasia).  Vision changes--such as double vision, blurred vision, or loss of vision--in one or both eyes.  Dizziness.  Nausea  and vomiting.  Severe headache with no known cause. The headache is often described as the worst headache ever experienced. If possible, make note of the exact time that you last felt like your normal self and what time your symptoms started. Tell your health care provider. If symptoms come and go, this could be a sign of a warning stroke, or TIA. Get help right away, even if you feel better. How is this diagnosed? This condition may be diagnosed based on:  Your symptoms, your medical history, and a physical exam.  CT scan of the brain.  MRI.  CT angiogram. This test uses a computer to take X-rays of your arteries. A dye may be injected into your blood to show the inside of your blood vessels more clearly.  MRI angiogram. This is a type of MRI that is used to evaluate the blood vessels.  Cerebral angiogram. This test uses X-rays and a dye to show the blood vessels in the brain and neck. You may need to see a health care provider who specializes in stroke care. A stroke specialist can be seen in person or through communication using telephone or television technology (telemedicine). Other tests may also be done to find the cause of the stroke, such as:  Electrocardiogram (ECG).  Continuous heart monitoring.  Echocardiogram.  Transesophageal echocardiogram (TEE).  Carotid ultrasound.  A scan of the brain circulation.  Blood tests.  Sleep study to check for sleep apnea. How is this treated? Treatment for this condition will depend on the duration, severity, and cause of your symptoms and on the area of the brain affected. It is very important to get treatment at the first sign of stroke symptoms. Some treatments work better if they are done within 3-6 hours of the onset of stroke symptoms. These initial treatments may include:  Aspirin.  Medicines to control blood pressure.  Medicine given by injection to dissolve the blood clot (thrombolytic).  Treatments given directly to  the affected artery to remove or dissolve the blood clot. Other treatment options may include:  Oxygen.  IV fluids.  Medicines to thin the blood (anticoagulants or antiplatelets).  Procedures to increase blood flow. Medicines and changes to your diet may be used to help treat and manage risk factors for stroke, such as diabetes, high cholesterol, and high blood pressure. After a stroke, you may work with physical, speech, mental health, or occupational therapists to help you recover. Follow these instructions at home: Medicines  Take over-the-counter and prescription medicines only as told by your health care provider.  If you were told to take a medicine to thin your blood, such as aspirin or an anticoagulant, take it exactly as told by your health care provider. ? Taking too much blood-thinning medicine can cause bleeding. ? If you do not take enough blood-thinning medicine, you will not have the protection that you need against another stroke and other problems.  Understand the side effects of taking anticoagulant medicine.  When taking this type of medicine, make sure you: ? Hold pressure over any cuts for longer than usual. ? Tell your dentist and other health care providers that you are taking anticoagulants before you have any procedures that may cause bleeding. ? Avoid activities that may cause trauma or injury. Eating and drinking  Follow instructions from your health care provider about diet.  Eat healthy foods.  If your ability to swallow was affected by the stroke, you may need to take steps to avoid choking, such as: ? Taking small bites when eating. ? Eating foods that are soft or pureed. Safety  Follow instructions from your health care team about physical activity.  Use a walker or cane as told by your health care provider.  Take steps to create a safe home environment in order to reduce the risk of falls. This may include: ? Having your home looked at by  specialists. ? Installing grab bars in the bedroom and bathroom. ? Using safety equipment, such as raised toilets and a seat in the shower. General instructions  Do not use any tobacco products, such as cigarettes, chewing tobacco, and e-cigarettes. If you need help quitting, ask your health care provider.  Limit alcohol intake to no more than 1 drink a day for nonpregnant women and 2 drinks a day for men. One drink equals 12 oz of beer, 5 oz of wine, or 1 oz of hard liquor.  If you need help to stop using drugs or alcohol, ask your health care provider about a referral to a program or specialist.  Maintain an active and healthy lifestyle. Get regular exercise as told by your health care provider.  Keep all follow-up visits as told by your health care provider, including visits with all specialists on your health care team. This is important. How is this prevented? Your risk of another stroke can be decreased by managing high blood pressure, high cholesterol, diabetes, heart disease, sleep apnea, and obesity. It can also be decreased by quitting smoking, limiting alcohol, and staying physically active. Your health care provider will continue to work with you on measures to prevent short-term and long-term complications of stroke. Get help right away if:   You have any symptoms of a stroke. "BE FAST" is an easy way to remember the main warning signs of a stroke: ? B - Balance. Signs are dizziness, sudden trouble walking, or loss of balance. ? E - Eyes. Signs are trouble seeing or a sudden change in vision. ? F - Face. Signs are sudden weakness or numbness of the face, or the face or eyelid drooping on one side. ? A - Arms. Signs are weakness or numbness in an arm. This happens suddenly and usually on one side of the body. ? S - Speech. Signs are sudden trouble speaking, slurred speech, or trouble understanding what people say. ? T - Time. Time to call emergency services. Write down what  time symptoms started.  You have other signs of a stroke, such as: ? A sudden, severe headache with no known cause. ? Nausea or vomiting. ? Seizure.  These symptoms may represent a serious problem that is an emergency. Do not wait to see if the symptoms will go away. Get medical help right away. Call your local emergency services (911 in the [place] Do not drive yourself to the hospital. Summary  An ischemic stroke (cerebrovascular accident, or CVA) is the sudden death of brain tissue that occurs when an area of the brain  does not get enough oxygen.  Symptoms of this condition usually develop suddenly, or you may notice them after waking up from sleep.  It is very important to get treatment at the first sign of stroke symptoms. Stroke is a medical emergency that must be treated right away. This information is not intended to replace advice given to you by your health care provider. Make sure you discuss any questions you have with your health care provider. Document Released: 05/15/2005 Document Revised: 02/01/2018 Document Reviewed: 08/11/2015 Elsevier Interactive Patient Education  2019 [employer].

## 2018-10-27 NOTE — Consult Note (Signed)
Cardiology Consultation:   Patient ID: Tramon Crescenzo MRN: [id number]; DOB: Nov 06, 1950  Admit date: 10/21/2018 Date of Consult: 10/27/2018  Primary Care Provider: Patient, No Pcp Per Primary Cardiologist: Mertie Moores, MD  Primary Electrophysiologist:  None    Patient Profile:   Orvil Faraone is a 68 y.o. 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 Leonie Man.  History of Present Illness:   Mr. Beamer was recently hospitalized 09/27/18-10/02/18 with hypoxia found to have systolic and diastolic heart failure EF 15%. Right and left heart cath with chronically occluded RCA with collaterals, no intervention. Nash numbers well preserved. ( RA 1, RV 21/2, PA 22/11/ PCWP 7, Fick 4.7/2.7) It was felt his heart failure was nonischemic in nature and, therefore, no lifevest was prescribed. CHF thought to be due to chemotherapy, HTN, and cocaine use. He also had right thoracentesis on 09/27/18 for recurrent pleural effusion with 1.7 L removed. He was discharged with ASA, statin, and coreg. Were not able to titrate heart failure medications prior to discharge.     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 He was intubated for airway protection. He required phenylephrine for BP. Extubated 10/23/18 and followed commands. Coumadin was started 10/24/18 for stroke prevention with 325 mg ASA bridge. NICM was thought to be the etiology of his stroke.   Patient has improved greatly with minimal neuro deficit.  Discharge pending SNF/CIR placement. Telemetry overnight found NSVT - 24 beats. Cardiology was asked to evaluate given EF of 15%.   Currently doing well. Denies  CP or SOB. No syncope or palpitations. Says he lives at home and functions independently. Denies ETOH use.      Past Medical History:  Diagnosis Date  . Combined congestive systolic and diastolic heart failure ([place])   . COPD (chronic obstructive pulmonary disease) ([place])   . Hypercholesteremia   . Lung cancer ([place]) 03/2016    Past Surgical History:  Procedure Laterality Date  . Arm Surgery    . HERNIA REPAIR    . IR ANGIO INTRA EXTRACRAN SEL COM CAROTID INNOMINATE UNI L MOD SED  10/21/2018  . IR ANGIO VERTEBRAL SEL SUBCLAVIAN INNOMINATE UNI R MOD SED  10/21/2018  . IR CT HEAD LTD  10/21/2018  . IR PERCUTANEOUS ART THROMBECTOMY/INFUSION INTRACRANIAL INC DIAG ANGIO  10/21/2018  . IR THORACENTESIS ASP PLEURAL SPACE W/IMG GUIDE  09/27/2018  . RADIOLOGY WITH ANESTHESIA N/A 10/21/2018   Procedure: RADIOLOGY WITH ANESTHESIA;  Surgeon: Luanne Bras, MD;  Location: [facility];  Service: Radiology;  Laterality: N/A;  . REPLANTATION THUMB    . RIGHT/LEFT HEART CATH AND CORONARY ANGIOGRAPHY N/A 10/01/2018   Procedure: RIGHT/LEFT HEART CATH AND CORONARY ANGIOGRAPHY;  Surgeon: Troy Sine, MD;  Location: [facility];  Service: Cardiovascular;  Laterality: N/A;     Home Medications:  Prior to Admission medications   Medication Sig Start Date End Date Taking? Authorizing Provider  Albuterol Sulfate 2.5 MG/0.5ML NEBU Take 1 each by nebulization every 6 (six) hours as needed (for wheezing).  09/17/18  Yes [provider]  amitriptyline (ELAVIL) 50 MG tablet Take 50 mg by mouth at bedtime. 09/17/18  Yes [provider]  aspirin  81 MG chewable tablet Chew 1 tablet (81 mg total) by mouth daily. 10/03/18  Yes Rai, Ripudeep K, MD  carvedilol (COREG) 3.125 MG tablet Take 1 tablet (3.125 mg total) by mouth 2 (two) times daily with a meal. 10/02/18  Yes Rai, Ripudeep K, MD  diazepam (VALIUM) 5 MG tablet Take 5 mg by mouth every 4 (four) hours as needed for pain. Back pain 10/02/18  Yes  [provider]  ibuprofen (ADVIL) 800 MG tablet Take 800 mg by mouth 3 (three) times daily as needed (pain).  10/13/18  Yes [provider]  ipratropium (ATROVENT HFA) 17 MCG/ACT inhaler    Yes [provider]  latanoprost (XALATAN) 0.005 % ophthalmic solution Place 1 drop into both eyes 2 (two) times daily.  03/12/18  Yes [provider]  mometasone (ASMANEX) 220 MCG/INH inhaler    Yes [provider]  traMADol (ULTRAM) 50 MG tablet Take 1 tablet (50 mg total) by mouth every 8 (eight) hours as needed. 10/02/18 10/02/19 Yes Rai, Ripudeep K, MD  VENTOLIN HFA 108 (90 Base) MCG/ACT inhaler Inhale 1 puff into the lungs every 6 (six) hours as needed for wheezing or shortness of breath.  04/11/18  Yes [provider]    Inpatient Medications: Scheduled Meds: . aspirin  325 mg Oral Daily  . atorvastatin  20 mg Per Tube q1800  . carvedilol  3.125 mg Oral BID WC  . Chlorhexidine Gluconate Cloth  6 each Topical Daily  . Chlorhexidine Gluconate Cloth  6 each Topical Daily  . coumadin book   Does not apply Once  . furosemide  20 mg Intravenous Q12H  . latanoprost  1 drop Both Eyes BID  . pantoprazole  40 mg Oral QHS  . potassium chloride  20 mEq Oral TID  . sodium chloride flush  10-40 mL Intracatheter Q12H  . warfarin  10 mg Oral ONCE-1800  . warfarin   Does not apply Once  . Warfarin - Pharmacist Dosing Inpatient   Does not apply q1800   Continuous Infusions: . sodium chloride 10 mL/hr at 10/25/18 0103  . phenylephrine (NEO-SYNEPHRINE) Adult infusion Stopped (10/23/18 1617)   PRN Meds: acetaminophen **OR** acetaminophen (TYLENOL) oral liquid 160 mg/5 mL **OR** acetaminophen, albuterol, senna-docusate, sodium chloride flush  Allergies:   No Known Allergies  Social History:   Social History   Socioeconomic History  . Marital status: Single    Spouse name: Not on file  . Number of children: Not on file  . Years of education: Not on file   . Highest education level: Not on file  Occupational History  . Not on file  Social Needs  . Financial resource strain: Not on file  . Food insecurity:    Worry: Not on file    Inability: Not on file  . Transportation needs:    Medical: Not on file    Non-medical: Not on file  Tobacco Use  . Smoking status: Current Some Day Smoker  . Smokeless tobacco: Never Used  Substance and Sexual Activity  . Alcohol use: Not Currently  . Drug use: Not Currently  . Sexual activity: Not on file  Lifestyle  . Physical activity:    Days per week: Not on file    Minutes per session: Not on file  . Stress: Not on file  Relationships  . Social connections:    Talks on phone: Not on file    Gets together: Not on file    Attends religious service:  Not on file    Active member of club or organization: Not on file    Attends meetings of clubs or organizations: Not on file    Relationship status: Not on file  . Intimate partner violence:    Fear of current or ex partner: Not on file    Emotionally abused: Not on file    Physically abused: Not on file    Forced sexual activity: Not on file  Other Topics Concern  . Not on file  Social History Narrative  . Not on file    Family History:    Family History  Problem Relation Age of Onset  . Aneurysm Mother      Review of Systems: [y] = yes, [ ]  = no    General: Weight gain [] ; Weight loss [ ] ; Anorexia [ ] ; Fatigue [ ] ; Fever [ ] ; Chills [ ] ; Weakness Blue.Reese ]   Cardiac: Chest pain/pressure [ ] ; Resting SOB [ ] ; Exertional SOB [ ] ; Orthopnea [ ] ; Pedal Edema [] ; Palpitations [ ] ; Syncope [ ] ; Presyncope [ ] ; Paroxysmal nocturnal dyspnea[ ]    Pulmonary: Cough [ ] ; Wheezing[ ] ; Hemoptysis[ ] ; Sputum [ ] ; Snoring [ ]    GI: Vomiting[ ] ; Dysphagia[ ] ; Melena[ ] ; Hematochezia [ ] ; Heartburn[ ] ; Abdominal pain [ ] ; Constipation [ ] ; Diarrhea [ ] ; BRBPR [ ]    GU: Hematuria[ ] ; Dysuria [ ] ; Nocturia[ ]   Vascular: Pain in legs with walking [  ]; Pain in feet with lying flat [ ] ; Non-healing sores [ ] ; Stroke [ y]; TIA [ ] ; Slurred speech Blue.Reese ];   Neuro: Headaches[ ] ; Vertigo[ ] ; Seizures[ ] ; Paresthesias[ ] ;Blurred vision [ ] ; Diplopia [ ] ; Vision changes [ ]    Ortho/Skin: Arthritis [y]; Joint pain [y]; Muscle pain [ ] ; Joint swelling [ ] ; Back Pain [ ] ; Rash [ ]    Psych: Depression[ ] ; Anxiety[ ]    Heme: Bleeding problems [ ] ; Clotting disorders [ ] ; Anemia [ ]    Endocrine: Diabetes [ ] ; Thyroid dysfunction[ ]    Physical Exam/Data:   Vitals:   10/26/18 2004 10/26/18 2130 10/27/18 0449 10/27/18 1015  BP: 115/72  108/87 92/80  Pulse: (!) 55  95 (!) 103  Resp: (!) 22 18 18 16   Temp: 97.7 F (36.5 C)  (!) 97.5 F (36.4 C) 98.3 F (36.8 C)  TempSrc: Oral  Oral Oral  SpO2: 99%  99% 96%  Weight:      Height:        Intake/Output Summary (Last 24 hours) at 10/27/2018 1231 Last data filed at 10/27/2018 0900 Gross per 24 hour  Intake 800 ml  Output 2575 ml  Net -1775 ml   Last 3 Weights 10/21/2018 10/02/2018 10/01/2018  Weight (lbs) 133 lb 133 lb 9.6 oz 133 lb 14.4 oz  Weight (kg) 60.328 kg 60.601 kg 60.737 kg     Body mass index is 17.55 kg/m.  General:  Cachetic frail appearing male in NAD.  HEENT: normal x for temporal wasting and poor dentition Neck: supple. no JVD. Carotids 2+ bilat; no bruits. No lymphadenopathy or thryomegaly appreciated. Cor: PMI laterally displaced. Tach regular. + s3 Lungs: clear Abdomen: soft, nontender, nondistended. No hepatosplenomegaly. No bruits or masses. Good bowel sounds. Extremities: no cyanosis, clubbing, rash, edema Neuro: alert & orientedx3, cranial nerves grossly intact. moves all 4 extremities w/o difficulty. Affect pleasant   EKG:  NSR 99 LVH with repol abnls Personally reviewed  Telemetry:  Telemetry was personally reviewed and demonstrates:  Sinus tach 100-110 occasional PVCs.  NSVT 24 beats.   Relevant CV Studies:  Right and left heart cath 10/01/18:  Prox RCA to  Dist RCA lesion is 100% stenosed.  LV end diastolic pressure is low.  There is severe left ventricular systolic dysfunction.   There is dilated left ventricle with severe LV dysfunction with EF estimate less than 15%. Low right heart pressure secondary to recent significant diuresis.  Single-vessel coronary obstructive disease with total proximal occlusion of the RCA with evidence for left to right distal collateralization. Normal large LAD and left circumflex vessels with collateralization to the distal RCA.  RECOMMENDATION: Medical therapy for severe combined LV systolic and diastolic function with potential ultimate initiation of Entresto, aldosterone blockade, diuretics as blood pressure allow.  Consider of evaluation for LifeVest and subsequent ICD candidacy.   Echo 09/30/18 1. The left ventricle has a visually estimated ejection fraction of 15%. The cavity size was moderately dilated. Left ventricular diastolic Doppler parameters are consistent with impaired relaxation.  2. The right ventricle has severely reduced systolic function. The cavity was normal. There is no increase in right ventricular wall thickness.  3. Trivial pericardial effusion is present.  4. The mitral valve is abnormal. Mild thickening of the mitral valve leaflet. There is mild mitral annular calcification present.  5. The aortic valve is tricuspid. Mild thickening of the aortic valve. Laboratory Data:  Chemistry Recent Labs  Lab 10/24/18 0539 10/26/18 0315 10/27/18 0446  NA 143 138 138  K 3.1* 3.4* 3.7  CL 108 100 100  CO2 27 28 28   GLUCOSE 95 91 90  BUN 19 14 20   CREATININE 0.93 0.82 0.85  CALCIUM 8.1* 8.8* 8.8*  GFRNONAA >60 >60 >60  GFRAA >60 >60 >60  ANIONGAP 8 10 10     Recent Labs  Lab 10/21/18 1729  PROT 6.5  ALBUMIN 3.1*  AST 23  ALT 25  ALKPHOS 73  BILITOT 0.6   Hematology Recent Labs  Lab 10/24/18 0539 10/26/18 0315 10/27/18 0446  WBC 7.0 6.7 6.2  RBC 4.34 4.85 4.97  HGB  12.6* 14.2 14.6  HCT 38.1* 42.2 43.0  MCV 87.8 87.0 86.5  MCH 29.0 29.3 29.4  MCHC 33.1 33.6 34.0  RDW 15.1 14.6 14.6  PLT 267 297 314   Cardiac Enzymes Recent Labs  Lab 10/21/18 1729 10/22/18 0056 10/22/18 0425 10/22/18 1208  TROPONINI 0.11* 0.15* 0.18* 0.11*   No results for input(s): TROPIPOC in the last 168 hours.  BNPNo results for input(s): BNP, PROBNP in the last 168 hours.  DDimer No results for input(s): DDIMER in the last 168 hours.  Radiology/Studies:  Dg Chest Port 1 View  Result Date: 10/24/2018 CLINICAL DATA:  68 year old male with a history of respiratory failure EXAM: PORTABLE CHEST 1 VIEW COMPARISON:  10/23/2018 FINDINGS: Cardiomediastinal silhouette unchanged in size and contour. Right heart border partially obscured by overlying lung/pleural disease. Increasing opacity at the right lung base with obscuration the right hemidiaphragm. Left lung relatively well aerated. Interval removal of the endotracheal tube. Interval removal of gastric tube. Unchanged left upper extremity PICC. IMPRESSION: Increasing opacity at the right lung base likely a combination of enlarging pleural fluid and associated atelectasis/consolidation. Interval removal of endotracheal tube and gastric tube. Unchanged PICC Electronically Signed   By: Corrie Mckusick D.O.   On: 10/24/2018 07:47   Vas [place] Groin Pseudoaneurysm  Result Date: 10/24/2018  ARTERIAL PSEUDOANEURYSM  Exam: Right groin Indications: Patient complains of palpable knot. History: S/p catheterization. Performing  Technologist: Maudry Mayhew MHA, RDMS, RVT, RDCS  Examination Guidelines: A complete evaluation includes B-mode imaging, spectral Doppler, color Doppler, and power Doppler as needed of all accessible portions of each vessel. Bilateral testing is considered an integral part of a complete examination. Limited examinations for reoccurring indications may be performed as noted.  +------------+----------+----------+------+----------+ Right DuplexPSV (cm/s) Waveform PlaqueComment(s) +------------+----------+----------+------+----------+ CFA             31    triphasic                  +------------+----------+----------+------+----------+ PTA             8     monophasic                 +------------+----------+----------+------+----------+ Right Vein comments:Common femoral vein is patent with phasic flow.  Findings: Multiple heterogenous areas are noted in the groin with low-resistant arterial vascularity. This is suggestive of possible prominent lymph nodes versus unknown etiology.  Summary: No evidence of pseudoaneurysm, AVF or DVT  Diagnosing physician: Servando Snare MD Electronically signed by Servando Snare MD on 10/24/2018 at 12:41:04 PM.   --------------------------------------------------------------------------------    Final     Assessment and Plan:   1. Chronic systolic heart failure with severe biventricular failure due to mixed non-ischemic/ischemic CM  - recent echo EF 5/20 with EF 5-10% RV severely HK - degree of cardiomyopathy out of proportion to CAD. ? HTN in nature or due to substance abuse - volume status ok on IV lasix. Will switch to po  - add spiro 12.5  - start digoxin - continue low-dose carvedilol as tolerated - BP currently too low for ACE/ARB/ARNI or Bidil  - Will get cMRI   2. NSVT  - 24 beat run monomorphic VT - Asymptomatic. K 3.7. Will check mag - Keep K > 4.0 Mg > 2.0  - Will arrange for LifeVest  3. Stroke - almost certainly due to cardio-embolism - continue heparin/warfarin - can stop ASA when INR > 2.0   4. CAD, total chronic occlusion of RCA with collaterals - manage risk factors - encouraged smoking cessation - continue statis  5. HLD with LDL goal < 70 10/22/2018: Cholesterol 162; HDL 51; LDL Cholesterol 94; VLDL 17 10/23/2018: Triglycerides 118 - continue statin - will need aggressive lipid control -  repeat labs in 2 weeks   Signed, Glori Bickers, MD  3:03 PM

## 2018-10-27 NOTE — Progress Notes (Signed)
STROKE TEAM PROGRESS NOTE   SUBJECTIVE (INTERVAL HISTORY) Pt is sitting in bed. No neuro changes. INR today 1.5. pending SNF.   OBJECTIVE Vitals:   10/26/18 1430 10/26/18 2004 10/26/18 2130 10/27/18 0449  BP: 90/67 115/72  108/87  Pulse: (!) 51 (!) 55  95  Resp: 16 (!) 22 18 18   Temp: (!) 97.3 F (36.3 C) 97.7 F (36.5 C)  (!) 97.5 F (36.4 C)  TempSrc: Oral Oral  Oral  SpO2: 96% 99%  99%  Weight:      Height:        CBC:  Recent Labs  Lab 10/21/18 1729  10/22/18 0425  10/26/18 0315 10/27/18 0446  WBC 6.1  --  9.1   < > 6.7 6.2  NEUTROABS 3.8  --  7.0  --   --   --   HGB 14.7   < > 14.5   < > 14.2 14.6  HCT 44.5   < > 42.4   < > 42.2 43.0  MCV 88.5  --  86.2   < > 87.0 86.5  PLT 333  --  352   < > 297 314   < > = values in this interval not displayed.    Basic Metabolic Panel:  Recent Labs  Lab 10/22/18 0425 10/23/18 0323 10/24/18 0539 10/26/18 0315 10/27/18 0446  NA 143 145 143 138 138  K 3.6 3.4* 3.1* 3.4* 3.7  CL 108 111 108 100 100  CO2 22 24 27 28 28   GLUCOSE 92 83 95 91 90  BUN 12 13 19 14 20   CREATININE 0.97 1.09 0.93 0.82 0.85  CALCIUM 9.0 8.5* 8.1* 8.8* 8.8*  MG 1.7 1.7 1.8  --   --   PHOS 3.2  --  2.8  --   --     Lipid Panel:     Component Value Date/Time   CHOL 162 10/22/2018 0425   TRIG 118 10/23/2018 0323   HDL 51 10/22/2018 0425   CHOLHDL 3.2 10/22/2018 0425   VLDL 17 10/22/2018 0425   LDLCALC 94 10/22/2018 0425   HgbA1c:  Lab Results  Component Value Date   HGBA1C 5.7 (H) 10/22/2018   Urine Drug Screen:     Component Value Date/Time   LABOPIA NONE DETECTED 10/21/2018 1816   COCAINSCRNUR NONE DETECTED 10/21/2018 1816   LABBENZ POSITIVE (A) 10/21/2018 1816   AMPHETMU NONE DETECTED 10/21/2018 1816   THCU NONE DETECTED 10/21/2018 1816   LABBARB NONE DETECTED 10/21/2018 1816    Alcohol Level     Component Value Date/Time   ETH <10 10/21/2018 1727    IMAGING  CTA head and neck - reveals right M1 and M2 occlusionwith  acute thrombusin thedistal right M1 segment extending into the M2 branches bilaterally. This is occlusive with minimal collateral flow and poor opacification of right MCA branches. 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 46 mL   MRI scan of the brain :  Acute patchy right MCA infarct involving right temporal lobe, right insula, basal ganglia and lateral frontal lobe.  Old encephalomalacia in anterior frontal lobe likely from posttraumatic etiology   Interventional Radiology IMPRESSION: Status post endovascular revascularization of occluded right middle cerebral artery distal M1 segment, achieving TICI 2b revascularization, with 2 passes with the 5 mm x 33 mm Embotrap retrieval device.  ECHO  09/27/18 revealed estimatedleft ventricularejection fraction of 15%. The cavity size was moderately dilated. Left ventricular diastolic Doppler parameters are consistent with impaired relaxation. The right  ventricle hadseverely reduced systolic function. The cavity was normal. There is no increase in right ventricular wall thickness. Trivial pericardial effusion waspresent.   PHYSICAL EXAM Blood pressure 108/87, pulse 95, temperature (!) 97.5 F (36.4 C), temperature source Oral, resp. rate 18, height 6\' 1"  (1.854 m), weight 60.3 kg, SpO2 99 %.  Physical Exam:    Frail middle-aged African-American male who is not in distress. Poor dentition and missing teeth. Afebrile. Head is nontraumatic. Neck is supple without bruit. Cardiac exam no murmur or gallop. Lungs are clear to auscultation. Distal pulses are well felt. Neurological Exam : Patient is awake alert and interactive. Speech is mildly dysarthric, but could be d/t lack of teeth/poor dentition. No aphasia.  No gaze deviation, visual field full. EOMI. PERRL. He has mild left lower facial weakness. Tongue midline. BUE 4+/5 without drift noted. No drift in BLEs 4+/5. Sensation intact to LT throughout. Right plantars downgoing left is equivocal.   Bilateral FTN intact.   ASSESSMENT/PLAN [name] is a 68 y.o. male with history of ongoing tobacco use, COPD, CHF, cardiomyopathy (EF 15%), lung cancer, hx of substance abuse, and Hld presenting with acute onset of left sided weakness with hemineglect, left facial droop, confusion and dysphasia after falling in bathroom.   Stroke: Right MCA infarct secondary to right M1 occlusion treated with IV TPA and IR with TICI2b reperfusion - cardioembolic given EF 68%.  Etiology of cardiomyopathy could be due to radiation therapy of lung cancer.   Resultant near baseline  CT head - No acute intracranial pathology. No CT evidence of acute stroke or hemorrhage.   CTA H&N - right M1 and M2 occlusionwith acute thrombusin thedistal right M1 segment extending into the M2 branches bilaterally.  CTP positive for penumbra  IR with TICI 2b reperfusion  MRI head - Acute patchy right MCA infarct involving right temporal lobe, right insula, basal ganglia and lateral frontal lobe.  2D Echo - EF 15%  [employer] Virus 2  - negative  INR -1.2->1.3->1.5  LDL - 94  HgbA1c - 5.7  UDS - benzodiazepines  VTE prophylaxis - SCDs  aspirin 81 mg daily prior to admission, now on aspirin 325 mg daily and warfarin daily.  Once INR at goal 2-3, aspirin can be discontinued.  Patient counseled to be compliant with his antithrombotic medications  Ongoing aggressive stroke risk factor management  Therapy recommendations:  SNF   Disposition:  Pending  Cardiomyopathy  EF 15% on 09/27/2018  Heart cath on 10/01/18  Likely the cause of current stroke  On Coumadin with INR goal 2-3   Aspirin bridge  Follow-up with cardiology  NSVT  Found on tele overnight with 24 beats of NSVT  Given EF 15%, will need cardiology consult  Hypotension  BP runs low due to cardiomyopathy . On Coreg and Lasix per cardiology . Long-term BP goal normotensive . Close monitoring  Hyperlipidemia  Lipid  lowering medication PTA:  none  LDL 94, goal < 70  Current lipid lowering medication: Lipitor 20 mg daily  Continue statin at discharge  Tobacco abuse  Current smoker  Smoking cessation counseling provided  Pt is willing to quit  Other Stroke Risk Factors  Advanced age  Other Active Problems  Hypokalemia 3.4 -> 3.7  History lung cancer status post surgery and radiation in 2018   Hospital day # 6  Rosalin Hawking, MD PhD Stroke Neurology 10/27/2018 11:43 AM    To contact Stroke Continuity provider, please refer to [email or web address] After hours,  contact General Neurology

## 2018-10-27 NOTE — Progress Notes (Addendum)
ANTICOAGULATION CONSULT NOTE   Pharmacy Consult for warfarin Indication: cardioembolic stroke  No Known Allergies  Patient Measurements: Height: 6\' 1"  (185.4 cm) Weight: 133 lb (60.3 kg) IBW/kg (Calculated) : 79.9  Vital Signs: Temp: 98.3 F (36.8 C) (05/31 1015) Temp Source: Oral (05/31 1015) BP: 92/80 (05/31 1015) Pulse Rate: 103 (05/31 1015)  Labs: Recent Labs    10/25/18 0157 10/26/18 0315 10/27/18 0446  HGB  --  14.2 14.6  HCT  --  42.2 43.0  PLT  --  297 314  LABPROT 15.2 16.4* 18.3*  INR 1.2 1.3* 1.5*  CREATININE  --  0.82 0.85   Estimated Creatinine Clearance: 71.9 mL/min (by C-G formula based on SCr of 0.85 mg/dL).  Medical History: Past Medical History:  Diagnosis Date  . Combined congestive systolic and diastolic heart failure ([place])   . COPD (chronic obstructive pulmonary disease) ([place])   . Hypercholesteremia   . Lung cancer ([place]) 03/2016   Assessment: Jared Stevens is a 68yo male admitted with R MCA infarct s/p tpA and thrombectomy on 5/25. Patient with EF 15% with impaired relaxation and severely reduced RV function. Pharmacy consulted to start warfarin.    Data from a sub-analysis of the WARCEF trial in 2014 supports stroke risk reduction with warfarin in HF patients with prior stroke and severely reduced EF.   Goal of Therapy:  INR 2-3 Monitor platelets by anticoagulation protocol: Yes   Today, 10/27/2018 INR 1.5 after 2 x 5mg , 1 x 7.5 Warfarin Po intake 100% trays   Plan:  Warfarin 10 mg today at 1800 Daily INR ASA 325mg  daily, plan decrease to 81mg  once INR at 2.0  Thank you for involving pharmacy in this patient's care.  Minda Ditto PharmD Phone: [phone] 10/27/2018 11:41 AM

## 2018-10-28 ENCOUNTER — Inpatient Hospital Stay (HOSPITAL_COMMUNITY): Payer: Medicare HMO

## 2018-10-28 DIAGNOSIS — F17200 Nicotine dependence, unspecified, uncomplicated: Secondary | ICD-10-CM

## 2018-10-28 DIAGNOSIS — I5021 Acute systolic (congestive) heart failure: Secondary | ICD-10-CM

## 2018-10-28 DIAGNOSIS — I5042 Chronic combined systolic (congestive) and diastolic (congestive) heart failure: Secondary | ICD-10-CM

## 2018-10-28 DIAGNOSIS — I63411 Cerebral infarction due to embolism of right middle cerebral artery: Principal | ICD-10-CM

## 2018-10-28 LAB — MAGNESIUM: Magnesium: 1.9 mg/dL (ref 1.7–2.4)

## 2018-10-28 LAB — BASIC METABOLIC PANEL
Anion gap: 8 (ref 5–15)
BUN: 19 mg/dL (ref 8–23)
CO2: 27 mmol/L (ref 22–32)
Calcium: 8.8 mg/dL — ABNORMAL LOW (ref 8.9–10.3)
Chloride: 102 mmol/L (ref 98–111)
Creatinine, Ser: 0.81 mg/dL (ref 0.61–1.24)
GFR calc Af Amer: >60 mL/min (ref >60)
GFR calc non Af Amer: >60 mL/min (ref >60)
Glucose, Bld: 88 mg/dL (ref 70–99)
Potassium: 4.1 mmol/L (ref 3.5–5.1)
Sodium: 137 mmol/L (ref 135–145)

## 2018-10-28 LAB — CBC
HCT: 43.3 % (ref 39.0–52.0)
Hemoglobin: 14.4 g/dL (ref 13.0–17.0)
MCH: 29 pg (ref 26.0–34.0)
MCHC: 33.3 g/dL (ref 30.0–36.0)
MCV: 87.3 fL (ref 80.0–100.0)
Platelets: 341 10*3/uL (ref 150–400)
RBC: 4.96 MIL/uL (ref 4.22–5.81)
RDW: 14.6 % (ref 11.5–15.5)
WBC: 5.8 10*3/uL (ref 4.0–10.5)
nRBC: 0 % (ref 0.0–0.2)

## 2018-10-28 LAB — PROTIME-INR
INR: 2.4 — ABNORMAL HIGH (ref 0.8–1.2)
Prothrombin Time: 26 seconds — ABNORMAL HIGH (ref 11.4–15.2)

## 2018-10-28 MED ORDER — ATORVASTATIN CALCIUM 40 MG PO TABS: 40.0000 mg | ORAL_TABLET | Freq: Every day | ORAL | Status: DC

## 2018-10-28 MED ORDER — GADOBUTROL 1 MMOL/ML IV SOLN
9.0000 mL | Freq: Once | INTRAVENOUS | Status: AC | PRN
  Administered 2018-10-28: 9 mL via INTRAVENOUS

## 2018-10-28 MED ORDER — MAGNESIUM OXIDE 400 (241.3 MG) MG PO TABS
400.0000 mg | ORAL_TABLET | Freq: Two times a day (BID) | ORAL | Status: DC
  Administered 2018-10-28 – 2018-10-29 (×3): 400 mg via ORAL
  Filled 2018-10-28 (×4): qty 1

## 2018-10-28 NOTE — Progress Notes (Signed)
ANTICOAGULATION CONSULT NOTE   Pharmacy Consult for warfarin Indication: cardioembolic stroke  No Known Allergies  Patient Measurements: Height: 6\' 1"  (185.4 cm) Weight: 133 lb (60.3 kg) IBW/kg (Calculated) : 79.9  Vital Signs: BP: 108/74 (06/01 1009) Pulse Rate: 75 (06/01 1009)  Labs: Recent Labs    10/26/18 0315 10/27/18 0446 10/28/18 0449  HGB 14.2 14.6 14.4  HCT 42.2 43.0 43.3  PLT 297 314 341  LABPROT 16.4* 18.3* 26.0*  INR 1.3* 1.5* 2.4*  CREATININE 0.82 0.85 0.81   Estimated Creatinine Clearance: 75.5 mL/min (by C-G formula based on SCr of 0.81 mg/dL).  Medical History: Past Medical History:  Diagnosis Date  . Combined congestive systolic and diastolic heart failure ([place])   . COPD (chronic obstructive pulmonary disease) ([place])   . Hypercholesteremia   . Lung cancer ([place]) 03/2016   Assessment: Jared Stevens is a 68yo male admitted with R MCA infarct s/p TPA and thrombectomy on 5/25. Patient with EF 15% with impaired relaxation and severely reduced RV function. Pharmacy consulted to start warfarin.    Today, 10/28/18:  INR 1.2 > 1.3 > 1.5 > 2.4 (warfarin 5mg , 5mg , 7.5mg , 10mg )  CBC: H/H, Pltc WNL  PO intake: 100% of meals charted  Goal of Therapy:  INR 2-3 Monitor platelets by anticoagulation protocol: Yes     Plan:   Hold warfarin today due to large increase in INR (and anticipate may increase further with warfarin 10 mg given yesterday)  D/C ASA as INR 2-3 per discussion with Dr. Erlinda Hong  Daily PT/INR  Monitor closely for s/sx of bleeding   Thank you for involving pharmacy in this patient's care.  Lindell Spar, PharmD, BCPS Clinical Pharmacist Phone: [phone] 10/28/2018 11:15 AM

## 2018-10-28 NOTE — NC FL2 (Signed)
[place] MEDICAID FL2 LEVEL OF CARE SCREENING TOOL     IDENTIFICATION  Patient Name: Jared Stevens Birthdate: 03-26-1951 Sex: male Admission Date (Current Location): 10/21/2018  [facility] and [place] Number:  Herbalist and Address:  [facility] [place], [place] [address], [place], [place] [address]      Provider Number: [id number]  Attending Physician Name and Address:  Rosalin Hawking, MD  Relative Name and Phone Number:       Current Level of Care: Hospital Recommended Level of Care: [place] Prior Approval Number:    Date Approved/Denied:   PASRR Number: [id number]  Discharge Plan: SNF    Current Diagnoses: Patient Active Problem List   Diagnosis Date Noted  . Stroke (cerebrum) ([place]) 10/21/2018  . 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 Recurrent right pleural effusion   . Acute systolic CHF (congestive heart failure) ([place])   . CHF exacerbation ([place]) 09/27/2018  . History of lung cancer 09/27/2018  . Acute on chronic respiratory failure with hypoxia ([place]) 09/27/2018  . Elevated brain natriuretic peptide (BNP) level 09/27/2018  . COPD (chronic obstructive pulmonary disease) ([place]) 09/27/2018  . Hyperlipidemia 09/27/2018  . Tobacco abuse 09/27/2018  . Non-small cell lung cancer, right ([place]) 08/26/2018    Orientation RESPIRATION BLADDER Height & Weight     Self, Time, Situation, Place  Normal Continent, External catheter Weight: 133 lb (60.3 kg) Height:  6\' 1"  (185.4 cm)  BEHAVIORAL SYMPTOMS/MOOD NEUROLOGICAL BOWEL NUTRITION STATUS      Continent Diet(see discharge summary)  AMBULATORY STATUS COMMUNICATION OF NEEDS Skin   Limited Assist Verbally Skin abrasions, Other (Comment)(abrasions on head and face; ecchymosis on legs bilaterally)                       Personal Care Assistance Level of Assistance  Bathing, Feeding, Dressing Bathing Assistance: Limited assistance Feeding assistance:  Independent Dressing Assistance: Limited assistance     Functional Limitations Info  Sight, Hearing, Speech Sight Info: Adequate Hearing Info: Adequate Speech Info: Adequate    SPECIAL CARE FACTORS FREQUENCY  OT (By licensed OT), PT (By licensed PT)     PT Frequency: 5x week OT Frequency: 5x week            Contractures Contractures Info: Not present    Additional Factors Info  Code Status, Allergies Code Status Info: Full Code Allergies Info: No Known Allergies           Current Medications (10/28/2018):  This is the current hospital active medication list Current Facility-Administered Medications  Medication Dose Route Frequency Provider Last Rate Last Dose  . 0.9 %  sodium chloride infusion   Intravenous Continuous Desai, Rahul P, PA-C 10 mL/hr at 10/25/18 0103    . acetaminophen (TYLENOL) tablet 650 mg  650 mg Oral Q4H PRN Luanne Bras, MD   650 mg at 10/23/18 2007   Or  . acetaminophen (TYLENOL) solution 650 mg  650 mg Per Tube Q4H PRN Luanne Bras, MD       Or  . acetaminophen (TYLENOL) suppository 650 mg  650 mg Rectal Q4H PRN Deveshwar, Sanjeev, MD      . albuterol (PROVENTIL) (2.5 MG/3ML) 0.083% nebulizer solution 2.5 mL  2.5 mL Nebulization Q6H PRN Kerney Elbe, MD      . atorvastatin (LIPITOR) tablet 20 mg  20 mg Per Tube q1800 Desai, Rahul P, PA-C   20 mg at 10/27/18 1651  . carvedilol (COREG) tablet  3.125 mg  3.125 mg Oral BID WC Jennelle Human B, NP   3.125 mg at 10/27/18 1651  . Chlorhexidine Gluconate Cloth 2 % PADS 6 each  6 each Topical Daily Kerney Elbe, MD   6 each at 10/28/18 1002  . Chlorhexidine Gluconate Cloth 2 % PADS 6 each  6 each Topical Daily Kerney Elbe, MD   6 each at 10/28/18 1002  . digoxin (LANOXIN) tablet 0.125 mg  0.125 mg Oral Daily Bensimhon, Shaune Pascal, MD   0.125 mg at 10/27/18 1651  . furosemide (LASIX) tablet 40 mg  40 mg Oral Daily Bensimhon, Shaune Pascal, MD      . latanoprost (XALATAN) 0.005 % ophthalmic solution 1  drop  1 drop Both Eyes BID Kerney Elbe, MD   1 drop at 10/28/18 1002  . magnesium oxide (MAG-OX) tablet 400 mg  400 mg Oral BID Nahser, Wonda Cheng, MD   400 mg at 10/28/18 1001  . pantoprazole (PROTONIX) EC tablet 40 mg  40 mg Oral QHS Kerney Elbe, MD   40 mg at 10/27/18 2131  . potassium chloride SA (K-DUR) CR tablet 20 mEq  20 mEq Oral TID Rinehuls, David L, PA-C   20 mEq at 10/28/18 1001  . senna-docusate (Senokot-S) tablet 1 tablet  1 tablet Oral QHS PRN Kerney Elbe, MD   1 tablet at 10/25/18 [phone]  . sodium chloride flush (NS) 0.9 % injection 10-40 mL  10-40 mL Intracatheter Q12H Kerney Elbe, MD   10 mL at 10/28/18 1002  . sodium chloride flush (NS) 0.9 % injection 10-40 mL  10-40 mL Intracatheter PRN Kerney Elbe, MD   10 mL at 10/25/18 1109  . spironolactone (ALDACTONE) tablet 12.5 mg  12.5 mg Oral Daily Bensimhon, Shaune Pascal, MD   12.5 mg at 10/27/18 1651  . Warfarin - Pharmacist Dosing Inpatient   Does not apply q1800 Carney, Gay Filler, [facility]         Discharge Medications: Please see discharge summary for a list of discharge medications.  Relevant Imaging Results:  Relevant Lab Results:   Additional Information SS#[id number]  [place], [place]

## 2018-10-28 NOTE — Progress Notes (Signed)
Physical Therapy Treatment Patient Details Name: Jared Stevens MRN: [id number] DOB: [birth year] Today's Date: 10/28/2018    History of Present Illness Pt is a 68 y.o. M with significant PMH of stage III NSCLC (adenocarcinoma) s/p chemo 2018, COPD, ischemic cardiomyopathy with EF 15% who presents with left hemineglect and weakness. CTA head and neck reveals right M1 and M2 occlusion with acute thrombus in the distal right M1 segment leading into M2 branches bilaterally. MRI showing acute patchy right MCA infarct involving right temporal lobe, right insula, basal ganglia and lateral frontal lobe.    PT Comments    Pt received in bed, agreeable to participation in therapy. Pt is pleasant but confused. He required min guard assist bed mobility, min assist transfers, and min assist ambulation 200 feet without AD. R list/lean noted during gait with min assist required to maintain balance. Pt able to attend to L side wit verbal cues. Pt positioned in recliner with feet elevated at end of session.    Follow Up Recommendations  Supervision/Assistance - 24 hour;SNF     Equipment Recommendations  Rolling walker with 5" wheels    Recommendations for Other Services       Precautions / Restrictions Precautions Precautions: Fall;Other (comment) Precaution Comments: L inattention/neglect    Mobility  Bed Mobility Overal bed mobility: Needs Assistance Bed Mobility: Supine to Sit     Supine to sit: Min guard;HOB elevated     General bed mobility comments: +rail, increased time and effort  Transfers Overall transfer level: Needs assistance Equipment used: None Transfers: Sit to/from Stand Sit to Stand: Min assist         General transfer comment: assist to power up and stabilize initial standing balance  Ambulation/Gait Ambulation/Gait assistance: Min assist Gait Distance (Feet): 200 Feet Assistive device: None Gait Pattern/deviations: Step-through pattern;Decreased stride  length;Narrow base of support Gait velocity: decreased Gait velocity interpretation: <1.31 ft/sec, indicative of household ambulator General Gait Details: list/lean to right with min assist to correct. Inattention/neglect L side. Pt able to attend to L with verbal cues.    Stairs             Wheelchair Mobility    Modified Rankin (Stroke Patients Only) Modified Rankin (Stroke Patients Only) Pre-Morbid Rankin Score: No symptoms Modified Rankin: Moderately severe disability     Balance Overall balance assessment: Needs assistance Sitting-balance support: Feet supported Sitting balance-Leahy Scale: Fair     Standing balance support: No upper extremity supported;During functional activity Standing balance-Leahy Scale: Poor Standing balance comment: reliant on therapist support                            Cognition Arousal/Alertness: Awake/alert Behavior During Therapy: WFL for tasks assessed/performed Overall Cognitive Status: Impaired/Different from baseline Area of Impairment: Attention;Problem solving;Awareness;Safety/judgement;Following commands;Orientation                 Orientation Level: Disoriented to;Time;Situation Current Attention Level: Sustained   Following Commands: Follows one step commands with increased time Safety/Judgement: Decreased awareness of deficits;Decreased awareness of safety Awareness: Intellectual Problem Solving: Slow processing;Decreased initiation;Requires verbal cues General Comments: Easily distracted. Multi requests for one-step directions.      Exercises      General Comments        Pertinent Vitals/Pain Pain Assessment: Faces Faces Pain Scale: No hurt    Home Living  Prior Function            PT Goals (current goals can now be found in the care plan section) Acute Rehab PT Goals Patient Stated Goal: not stated PT Goal Formulation: With patient Time For Goal  Achievement: 11/06/18 Potential to Achieve Goals: Good Progress towards PT goals: Progressing toward goals    Frequency    Min 4X/week      PT Plan Current plan remains appropriate    Co-evaluation              AM-PAC PT "6 Clicks" Mobility   Outcome Measure  Help needed turning from your back to your side while in a flat bed without using bedrails?: None Help needed moving from lying on your back to sitting on the side of a flat bed without using bedrails?: A Little Help needed moving to and from a bed to a chair (including a wheelchair)?: A Little Help needed standing up from a chair using your arms (e.g., wheelchair or bedside chair)?: A Little Help needed to walk in hospital room?: A Little Help needed climbing 3-5 steps with a railing? : A Lot 6 Click Score: 18    End of Session Equipment Utilized During Treatment: Gait belt Activity Tolerance: Patient tolerated treatment well Patient left: in chair;with call bell/phone within reach;with chair alarm set Nurse Communication: Mobility status PT Visit Diagnosis: Other abnormalities of gait and mobility (R26.89);Unsteadiness on feet (R26.81);Other symptoms and signs involving the nervous system (R29.898)     Time: 0935-1001 PT Time Calculation (min) (ACUTE ONLY): 26 min  Charges:  $Gait Training: 23-37 mins                     Jared Stevens, [place]  Office # [phone] Pager [phone]    Jared Stevens 10/28/2018, 10:08 AM

## 2018-10-28 NOTE — Progress Notes (Addendum)
STROKE TEAM PROGRESS NOTE   SUBJECTIVE (INTERVAL HISTORY) Pt sitting in bed, awake alert, no complaints, neuro stable.  INR 2.4 today.  Cardiology on board, cardio MRI pending.  They recommend LifeVest arrangement.  OBJECTIVE Vitals:   10/27/18 1942 10/27/18 1947 10/28/18 1000 10/28/18 1009  BP: (!) 87/72 115/65  108/74  Pulse: 78 96 (!) 49 75  Resp: 15     Temp: 97.8 F (36.6 C)     TempSrc: Oral     SpO2: 99%   99%  Weight:      Height:        CBC:  Recent Labs  Lab 10/21/18 1729  10/22/18 0425  10/27/18 0446 10/28/18 0449  WBC 6.1  --  9.1   < > 6.2 5.8  NEUTROABS 3.8  --  7.0  --   --   --   HGB 14.7   < > 14.5   < > 14.6 14.4  HCT 44.5   < > 42.4   < > 43.0 43.3  MCV 88.5  --  86.2   < > 86.5 87.3  PLT 333  --  352   < > 314 341   < > = values in this interval not displayed.    Basic Metabolic Panel:  Recent Labs  Lab 10/22/18 0425  10/24/18 0539  10/27/18 0446 10/27/18 1645 10/28/18 0449  NA 143   < > 143   < > 138  --  137  K 3.6   < > 3.1*   < > 3.7  --  4.1  CL 108   < > 108   < > 100  --  102  CO2 22   < > 27   < > 28  --  27  GLUCOSE 92   < > 95   < > 90  --  88  BUN 12   < > 19   < > 20  --  19  CREATININE 0.97   < > 0.93   < > 0.85  --  0.81  CALCIUM 9.0   < > 8.1*   < > 8.8*  --  8.8*  MG 1.7   < > 1.8  --   --  2.0 1.9  PHOS 3.2  --  2.8  --   --   --   --    < > = values in this interval not displayed.    Lipid Panel:     Component Value Date/Time   CHOL 162 10/22/2018 0425   TRIG 118 10/23/2018 0323   HDL 51 10/22/2018 0425   CHOLHDL 3.2 10/22/2018 0425   VLDL 17 10/22/2018 0425   LDLCALC 94 10/22/2018 0425   HgbA1c:  Lab Results  Component Value Date   HGBA1C 5.7 (H) 10/22/2018   Urine Drug Screen:     Component Value Date/Time   LABOPIA NONE DETECTED 10/21/2018 1816   COCAINSCRNUR NONE DETECTED 10/21/2018 1816   LABBENZ POSITIVE (A) 10/21/2018 1816   AMPHETMU NONE DETECTED 10/21/2018 1816   THCU NONE DETECTED 10/21/2018  1816   LABBARB NONE DETECTED 10/21/2018 1816    Alcohol Level     Component Value Date/Time   ETH <10 10/21/2018 1727    IMAGING  CTA head and neck - reveals right M1 and M2 occlusionwith acute thrombusin thedistal right M1 segment extending into the M2 branches bilaterally. This is occlusive with minimal collateral flow and poor opacification of right MCA branches. CT perfusion  positive for right MCA stroke. 46 mL   MRI scan of the brain :  Acute patchy right MCA infarct involving right temporal lobe, right insula, basal ganglia and lateral frontal lobe.  Old encephalomalacia in anterior frontal lobe likely from posttraumatic etiology   Interventional Radiology IMPRESSION: Status post endovascular revascularization of occluded right middle cerebral artery distal M1 segment, achieving TICI 2b revascularization, with 2 passes with the 5 mm x 33 mm Embotrap retrieval device.  ECHO  09/27/18 revealed estimatedleft ventricularejection fraction of 15%. The cavity size was moderately dilated. Left ventricular diastolic Doppler parameters are consistent with impaired relaxation. The right ventricle hadseverely reduced systolic function. The cavity was normal. There is no increase in right ventricular wall thickness. Trivial pericardial effusion waspresent.   PHYSICAL EXAM Blood pressure 108/74, pulse 75, temperature 97.8 F (36.6 C), temperature source Oral, resp. rate 15, height 6\' 1"  (1.854 m), weight 60.3 kg, SpO2 99 %.  Physical Exam:    Frail middle-aged African-American male who is not in distress. Poor dentition and missing teeth. Afebrile. Head is nontraumatic. Neck is supple without bruit. Cardiac exam no murmur or gallop. Lungs are clear to auscultation. Distal pulses are well felt. Neurological Exam : Patient is awake alert and interactive. Speech is mildly dysarthric, but could be d/t lack of teeth/poor dentition. No aphasia.  No gaze deviation, visual field full. EOMI.  PERRL. He has mild left lower facial weakness. Tongue midline. BUE 4+/5 without drift noted. No drift in BLEs 4+/5. Sensation intact to LT throughout. Right plantars downgoing left is equivocal.  Bilateral FTN intact.   ASSESSMENT/PLAN Jared Stevens is a 68 y.o. male with history of ongoing tobacco use, COPD, CHF, cardiomyopathy (EF 15%), lung cancer, hx of substance abuse, and Hld presenting with acute onset of left sided weakness with hemineglect, left facial droop, confusion and dysphasia after falling in bathroom.   Stroke: Right MCA infarct secondary to right M1 occlusion treated with IV TPA and IR with TICI2b reperfusion - cardioembolic given EF 81%.    CT head - No acute intracranial pathology. No CT evidence of acute stroke or hemorrhage.   CTA H&N - right M1 and M2 occlusionwith acute thrombusin thedistal right M1 segment extending into the M2 branches bilaterally.  CTP positive for penumbra  IR with TICI 2b reperfusion  MRI head - Acute patchy right MCA infarct involving right temporal lobe, right insula, basal ganglia and lateral frontal lobe.  2D Echo - EF 15%  [employer] Virus 2  - negative  INR -1.2->1.3->1.5->2.4  LDL - 94  HgbA1c - 5.7  UDS - benzodiazepines  VTE prophylaxis - SCDs  aspirin 81 mg daily prior to admission, now on warfarin daily.  INR at goal  Therapy recommendations:  SNF   Disposition:  Pending  Cardiomyopathy  ? Due to longstanding HTN/cocaine abuse/XRT effects following lung CA  EF 15% on 09/27/2018  Heart cath on 10/01/18  Likely the cause of current stroke  On Coumadin with INR goal 2-3   Cardiac MRI today - severe LV EF 11%, RVEF 22%. Large B pleural effusions, small circumferential peridardial effusion. No thrombus apical. Marked dyssynchrony btwn anterior and interior walls   NSVT  Found on tele overnight with 24 beats of NSVT  Given EF 15%  Cardiology on board. Started on Mag ox  For  LifeVest  Hypotension  BP stable but on the lower end due to cardiomyopathy . On Coreg and Lasix per cardiology . Long-term BP goal normotensive .  Close monitoring  Hyperlipidemia  Lipid lowering medication PTA:  none  LDL 94, goal < 70  Current lipid lowering medication: Increase to Lipitor 40 mg daily as per cardiology  Continue statin at discharge  Tobacco abuse  Current smoker  Smoking cessation counseling provided  Pt is willing to quit  Other Stroke Risk Factors  Advanced age  Other Active Problems  Hypokalemia, resolved 3.4-3.7-4.1  History lung cancer status post surgery and radiation in 2018  Hospital day # 7  Rosalin Hawking, MD PhD Stroke Neurology 10/28/2018 4:15 PM    To contact Stroke Continuity provider, please refer to [email or web address] After hours, contact General Neurology

## 2018-10-28 NOTE — Progress Notes (Addendum)
Occupational Therapy Treatment Patient Details Name: Jared Stevens MRN: [id number] DOB: Aug 21, 1950 Today's Date: 10/28/2018    History of present illness Pt is a 68 y.o. M with significant PMH of stage III NSCLC (adenocarcinoma) s/p chemo 2018, COPD, ischemic cardiomyopathy with EF 15% who presents with left hemineglect and weakness. CTA head and neck reveals right M1 and M2 occlusion with acute thrombus in the distal right M1 segment leading into M2 branches bilaterally. MRI showing acute patchy right MCA infarct involving right temporal lobe, right insula, basal ganglia and lateral frontal lobe.   OT comments  Pt progressing towards OT goals, presents sitting up in recliner agreeable to therapy session. Pt requiring minA for room level functional mobility without AD; pt with tendency to list R with mobility and unsteadiness throughout requiring consistent hands on assist to maintain balance. Pt also requiring cues to locate and attend to items on L side of environment. Pt is very easily distracted and requires cues to initiate participating in mobility and activity, though once standing at sink he was able to appropriately sequence through grooming ADL without assist; overall performing ADL task with minA for standing balance. Feel SNF recommendation remains appropriate at this time. Will continue to follow acutely.    Follow Up Recommendations  Supervision/Assistance - 24 hour;SNF    Equipment Recommendations  3 in 1 bedside commode          Precautions / Restrictions Precautions Precautions: Fall;Other (comment) Precaution Comments: L inattention/neglect Restrictions Weight Bearing Restrictions: No       Mobility Bed Mobility Overal bed mobility: Needs Assistance Bed Mobility: Supine to Sit     Supine to sit: Min guard;HOB elevated     General bed mobility comments: received OOB in recliner  Transfers Overall transfer level: Needs assistance Equipment used:  None Transfers: Sit to/from Stand Sit to Stand: Min assist         General transfer comment: assist to power up and stabilize initial standing balance    Balance Overall balance assessment: Needs assistance Sitting-balance support: Feet supported Sitting balance-Leahy Scale: Fair     Standing balance support: No upper extremity supported;During functional activity Standing balance-Leahy Scale: Poor Standing balance comment: reliant on therapist support                           ADL either performed or assessed with clinical judgement   ADL Overall ADL's : Needs assistance/impaired     Grooming: Wash/dry hands;Oral care;Minimal assistance;Min guard;Standing Grooming Details (indicate cue type and reason): pt initiates performing oral care and able to sequence through task appropriately; minA - close minguard for standing balance                             Functional mobility during ADLs: Minimal assistance General ADL Comments: pt continues to present with decreased standing balance, impaired cognition and decreased awareness/inattention to L side     Vision       Perception     Praxis      Cognition Arousal/Alertness: Awake/alert Behavior During Therapy: WFL for tasks assessed/performed Overall Cognitive Status: Impaired/Different from baseline Area of Impairment: Attention;Problem solving;Awareness;Safety/judgement;Following commands;Orientation;Memory                 Orientation Level: Disoriented to;Time;Situation Current Attention Level: Sustained Memory: Decreased short-term memory Following Commands: Follows one step commands with increased time Safety/Judgement: Decreased awareness of deficits;Decreased awareness of safety  Awareness: Intellectual Problem Solving: Slow processing;Decreased initiation;Requires verbal cues General Comments: Easily distracted. Multi requests for one-step directions.        Exercises      Shoulder Instructions       General Comments      Pertinent Vitals/ Pain       Pain Assessment: No/denies pain Faces Pain Scale: No hurt  Home Living                                          Prior Functioning/Environment              Frequency  Min 3X/week        Progress Toward Goals  OT Goals(current goals can now be found in the care plan section)  Progress towards OT goals: Progressing toward goals  Acute Rehab OT Goals Patient Stated Goal: not stated OT Goal Formulation: With patient Time For Goal Achievement: 11/13/18 Potential to Achieve Goals: Good  Plan Frequency remains appropriate;Discharge plan needs to be updated    Co-evaluation                 AM-PAC OT "6 Clicks" Daily Activity     Outcome Measure   Help from another person eating meals?: A Little Help from another person taking care of personal grooming?: A Little Help from another person toileting, which includes using toliet, bedpan, or urinal?: A Lot Help from another person bathing (including washing, rinsing, drying)?: A Lot Help from another person to put on and taking off regular upper body clothing?: A Little Help from another person to put on and taking off regular lower body clothing?: A Lot 6 Click Score: 15    End of Session Equipment Utilized During Treatment: Gait belt  OT Visit Diagnosis: Other abnormalities of gait and mobility (R26.89);Other symptoms and signs involving cognitive function;Cognitive communication deficit (R41.841);Other symptoms and signs involving the nervous system (R29.898) Symptoms and signs involving cognitive functions: Cerebral infarction   Activity Tolerance Patient tolerated treatment well   Patient Left in chair;with call bell/phone within reach;with chair alarm set   Nurse Communication Mobility status        Time: 4680-3212 OT Time Calculation (min): 14 min  Charges: OT General Charges $OT Visit: 1 Visit OT  Treatments $Self Care/Home Management : 8-22 mins  Lou Cal, OT Supplemental Rehabilitation Services Pager [phone] Office [phone]    Raymondo Band 10/28/2018, 1:04 PM

## 2018-10-28 NOTE — TOC Progression Note (Signed)
Transition of Care [facility]) - Progression Note    Patient Details  Name: Jared Stevens MRN: [id number] Date of Birth: 05-09-1951  Transition of Care [facility]) CM/SW [place], [place] Phone Number: 10/28/2018, 4:06 PM  Clinical Narrative:    Pt daughter sent offers and CMS ratings to [email or web address], will follow for any additional offers.    Expected Discharge Plan: [place] Barriers to Discharge: Continued Medical Work up, Ship broker  Expected Discharge Plan and Services Expected Discharge Plan: [place] In-house Referral: Clinical Social Work Discharge Planning Services: NA Post Acute Care Choice: [place] Living arrangements for the past 2 months: Apartment                    Social Determinants of Health (SDOH) Interventions    Readmission Risk Interventions No flowsheet data found.

## 2018-10-28 NOTE — Plan of Care (Signed)
  Problem: Education: Goal: Knowledge of disease or condition will improve Outcome: Progressing Goal: Knowledge of secondary prevention will improve Outcome: Progressing Goal: Knowledge of patient specific risk factors addressed and post discharge goals established will improve Outcome: Progressing Goal: Individualized Educational Video(s) Outcome: Progressing   Problem: Health Behavior/Discharge Planning: Goal: Ability to manage health-related needs will improve Outcome: Progressing   Problem: Nutrition: Goal: Risk of aspiration will decrease Outcome: Progressing Goal: Dietary intake will improve Outcome: Progressing   Problem: Ischemic Stroke/TIA Tissue Perfusion: Goal: Complications of ischemic stroke/TIA will be minimized Outcome: Progressing   Problem: Education: Goal: Knowledge of General Education information will improve Description Including pain rating scale, medication(s)/side effects and non-pharmacologic comfort measures Outcome: Progressing   Problem: Health Behavior/Discharge Planning: Goal: Ability to manage health-related needs will improve Outcome: Progressing   Problem: Clinical Measurements: Goal: Ability to maintain clinical measurements within normal limits will improve Outcome: Progressing Goal: Will remain free from infection Outcome: Progressing Goal: Diagnostic test results will improve Outcome: Progressing Goal: Respiratory complications will improve Outcome: Progressing Goal: Cardiovascular complication will be avoided Outcome: Progressing   Problem: Activity: Goal: Risk for activity intolerance will decrease Outcome: Progressing   Problem: Nutrition: Goal: Adequate nutrition will be maintained Outcome: Progressing   Problem: Coping: Goal: Level of anxiety will decrease Outcome: Progressing   Problem: Elimination: Goal: Will not experience complications related to bowel motility Outcome: Progressing Goal: Will not experience  complications related to urinary retention Outcome: Progressing   Problem: Pain Managment: Goal: General experience of comfort will improve Outcome: Progressing   Problem: Safety: Goal: Ability to remain free from injury will improve Outcome: Progressing   Problem: Skin Integrity: Goal: Risk for impaired skin integrity will decrease Outcome: Progressing   Problem: Education: Goal: Knowledge of disease or condition will improve Outcome: Progressing Goal: Knowledge of secondary prevention will improve Outcome: Progressing Goal: Knowledge of patient specific risk factors addressed and post discharge goals established will improve Outcome: Progressing Goal: Individualized Educational Video(s) Outcome: Progressing   Problem: Health Behavior/Discharge Planning: Goal: Ability to manage health-related needs will improve Outcome: Progressing   Problem: Nutrition: Goal: Risk of aspiration will decrease Outcome: Progressing Goal: Dietary intake will improve Outcome: Progressing   Problem: Ischemic Stroke/TIA Tissue Perfusion: Goal: Complications of ischemic stroke/TIA will be minimized Outcome: Progressing   Problem: Education: Goal: Knowledge of General Education information will improve Description Including pain rating scale, medication(s)/side effects and non-pharmacologic comfort measures Outcome: Progressing   Problem: Health Behavior/Discharge Planning: Goal: Ability to manage health-related needs will improve Outcome: Progressing   Problem: Clinical Measurements: Goal: Ability to maintain clinical measurements within normal limits will improve Outcome: Progressing Goal: Will remain free from infection Outcome: Progressing Goal: Diagnostic test results will improve Outcome: Progressing Goal: Respiratory complications will improve Outcome: Progressing Goal: Cardiovascular complication will be avoided Outcome: Progressing   Problem: Activity: Goal: Risk for  activity intolerance will decrease Outcome: Progressing   Problem: Nutrition: Goal: Adequate nutrition will be maintained Outcome: Progressing   Problem: Coping: Goal: Level of anxiety will decrease Outcome: Progressing   Problem: Elimination: Goal: Will not experience complications related to bowel motility Outcome: Progressing Goal: Will not experience complications related to urinary retention Outcome: Progressing   Problem: Pain Managment: Goal: General experience of comfort will improve Outcome: Progressing   Problem: Safety: Goal: Ability to remain free from injury will improve Outcome: Progressing   Problem: Skin Integrity: Goal: Risk for impaired skin integrity will decrease Outcome: Progressing

## 2018-10-28 NOTE — Progress Notes (Signed)
Pt called this nurse in the room and stated he doesn't understand what is going on with his care. Explained to pt POC.

## 2018-10-28 NOTE — Progress Notes (Signed)
Progress Note  Patient Name: Jared Stevens Date of Encounter: 10/28/2018  Primary Cardiologist: Mertie Moores, MD   Subjective    68 yo with hx of acute / chronic combined chf.    Admitted with CVA Was see by Dr. Haroldine Laws yesterday  Has been started on  Coreg, digoxin, lasix, spiro Received TPA and thrombectomy for his CVA .   Feeling better.  Has diurested 6.7 liters. So far this admission   Inpatient Medications    Scheduled Meds: . aspirin  325 mg Oral Daily  . atorvastatin  20 mg Per Tube q1800  . carvedilol  3.125 mg Oral BID WC  . Chlorhexidine Gluconate Cloth  6 each Topical Daily  . Chlorhexidine Gluconate Cloth  6 each Topical Daily  . digoxin  0.125 mg Oral Daily  . furosemide  40 mg Oral Daily  . latanoprost  1 drop Both Eyes BID  . pantoprazole  40 mg Oral QHS  . potassium chloride  20 mEq Oral TID  . sodium chloride flush  10-40 mL Intracatheter Q12H  . spironolactone  12.5 mg Oral Daily  . Warfarin - Pharmacist Dosing Inpatient   Does not apply q1800   Continuous Infusions: . sodium chloride 10 mL/hr at 10/25/18 0103  . phenylephrine (NEO-SYNEPHRINE) Adult infusion Stopped (10/23/18 1617)   PRN Meds: acetaminophen **OR** acetaminophen (TYLENOL) oral liquid 160 mg/5 mL **OR** acetaminophen, albuterol, senna-docusate, sodium chloride flush   Vital Signs    Vitals:   10/27/18 0449 10/27/18 1015 10/27/18 1942 10/27/18 1947  BP: 108/87 92/80 (!) 87/72 115/65  Pulse: 95 (!) 103 78 96  Resp: 18 16 15    Temp: (!) 97.5 F (36.4 C) 98.3 F (36.8 C) 97.8 F (36.6 C)   TempSrc: Oral Oral Oral   SpO2: 99% 96% 99%   Weight:      Height:        Intake/Output Summary (Last 24 hours) at 10/28/2018 0931 Last data filed at 10/28/2018 0500 Gross per 24 hour  Intake 560 ml  Output 750 ml  Net -190 ml   Last 3 Weights 10/21/2018 10/02/2018 10/01/2018  Weight (lbs) 133 lb 133 lb 9.6 oz 133 lb 14.4 oz  Weight (kg) 60.328 kg 60.601 kg 60.737 kg      Telemetry     NSR 96 - Personally Reviewed  ECG    - Personally Reviewed  Physical Exam   GEN: middle age man,  NAD  Neck: No JVD Cardiac: RRR,    Respiratory:  decreased breath sounds right lower lung field  GI: Soft, nontender, non-distended  MS: No edema; No deformity. Neuro:  Nonfocal  Psych: Normal affect   Labs    Chemistry Recent Labs  Lab 10/21/18 1729  10/26/18 0315 10/27/18 0446 10/28/18 0449  NA 143   < > 138 138 137  K 3.9   < > 3.4* 3.7 4.1  CL 106   < > 100 100 102  CO2 27   < > 28 28 27   GLUCOSE 81   < > 91 90 88  BUN 12   < > 14 20 19   CREATININE 1.03   < > 0.82 0.85 0.81  CALCIUM 9.1   < > 8.8* 8.8* 8.8*  PROT 6.5  --   --   --   --   ALBUMIN 3.1*  --   --   --   --   AST 23  --   --   --   --  ALT 25  --   --   --   --   ALKPHOS 73  --   --   --   --   BILITOT 0.6  --   --   --   --   GFRNONAA >60   < > >60 >60 >60  GFRAA >60   < > >60 >60 >60  ANIONGAP 10   < > 10 10 8    < > = values in this interval not displayed.     Hematology Recent Labs  Lab 10/26/18 0315 10/27/18 0446 10/28/18 0449  WBC 6.7 6.2 5.8  RBC 4.85 4.97 4.96  HGB 14.2 14.6 14.4  HCT 42.2 43.0 43.3  MCV 87.0 86.5 87.3  MCH 29.3 29.4 29.0  MCHC 33.6 34.0 33.3  RDW 14.6 14.6 14.6  PLT 297 314 341    Cardiac Enzymes Recent Labs  Lab 10/21/18 1729 10/22/18 0056 10/22/18 0425 10/22/18 1208  TROPONINI 0.11* 0.15* 0.18* 0.11*   No results for input(s): TROPIPOC in the last 168 hours.   BNPNo results for input(s): BNP, PROBNP in the last 168 hours.   DDimer No results for input(s): DDIMER in the last 168 hours.   Radiology    No results found.  Cardiac Studies     Patient Profile     68 y.o. male with a nonischemic CM,  Admitted with a CVA   Assessment & Plan    1.  Chronic systolic CHF :   EF 5-88%.   ? Due to longstanding HTN / cocaine abuse . Has CAD but the degree of CHF is out of proportion to his CAD . Cardiac MRI has been ordered.    2.   NSVT.   - had  another 14 beat run last night . Potassium is 4.1 ,  Mag is 1.9 . Will start Mag ox 400 BID   3.   CVA :   On I V heparin          For questions or updates, please contact [facility] Please consult [email or web address] for contact info under        Signed, Mertie Moores, MD  10/28/2018, 9:31 AM

## 2018-10-28 NOTE — Care Management Important Message (Signed)
Important Message  Patient Details  Name: Jared Stevens MRN: [id number] Date of Birth: 08/16/1950   Medicare Important Message Given:  Yes    Jared Stevens 10/28/2018, 3:33 PM

## 2018-10-28 NOTE — TOC Initial Note (Signed)
Transition of Care [facility]) - Initial/Assessment Note    Patient Details  Name: Jared Stevens MRN: [id number] Date of Birth: 03-Nov-1950  [facility]) CM/SW Contact:    Alexander Mt, [place] Phone Number: 10/28/2018, 11:34 AM  Clinical Narrative:                 Spoke with pt daughter Cyncirae via telephone, pt daughter states pt stays at her house but does not stay there all the time and often stays elsewhere. Pt daughter okay with him returning post rehab, pt pending insurance approval. CSW worked up pt for referral and pt daughter agreeable to rehab.   Will follow up with pt daughter to provide choices to her email [email or web address].  Expected Discharge Plan: Skilled Nursing Facility Barriers to Discharge: Continued Medical Work up, Ship broker   Patient Goals and CMS Choice Patient states their goals for this hospitalization and ongoing recovery are:: for him to get rehab before going home CMS Medicare.gov Compare Post Acute Care list provided to:: Patient Represenative (must comment)(pt daughter) Choice offered to / list presented to : Adult Children  Expected Discharge Plan and Services Expected Discharge Plan: [place] In-house Referral: Clinical Social Work Discharge Planning Services: NA Post Acute Care Choice: [place] Living arrangements for the past 2 months: Apartment     Prior Living Arrangements/Services Living arrangements for the past 2 months: Apartment Lives with:: Adult Children Patient language and need for interpreter reviewed:: Yes(no needs) Do you feel safe going back to the place where you live?: Yes      Need for Family Participation in Patient Care: Yes (Comment)(support with decision making) Care giver support system in place?: Yes (comment)(adult children)   Criminal Activity/Legal Involvement Pertinent to Current Situation/Hospitalization: No - Comment as needed  Activities of Daily  Living Home Assistive Devices/Equipment: Eyeglasses ADL Screening (condition at time of admission) Patient's cognitive ability adequate to safely complete daily activities?: No Is the patient deaf or have difficulty hearing?: No Does the patient have difficulty seeing, even when wearing glasses/contacts?: No Does the patient have difficulty concentrating, remembering, or making decisions?: No Patient able to express need for assistance with ADLs?: Yes Does the patient have difficulty dressing or bathing?: Yes Independently performs ADLs?: No Communication: Independent Dressing (OT): Needs assistance Is this a change from baseline?: Change from baseline, expected to last >3 days Grooming: Needs assistance Is this a change from baseline?: Change from baseline, expected to last >3 days Feeding: Independent Is this a change from baseline?: Change from baseline, expected to last >3 days Bathing: Needs assistance Is this a change from baseline?: Change from baseline, expected to last >3 days Toileting: Needs assistance Is this a change from baseline?: Change from baseline, expected to last >3days In/Out Bed: Needs assistance Is this a change from baseline?: Change from baseline, expected to last >3 days Walks in Home: Needs assistance Is this a change from baseline?: Change from baseline, expected to last >3 days Does the patient have difficulty walking or climbing stairs?: Yes Weakness of Legs: Left Weakness of Arms/Hands: Left  Permission Sought/Granted Permission sought to share information with : Family Supports, Customer service manager Permission granted to share information with : No(pt disoriented)  Share Information with NAME: Josiah Lobo  Permission granted to share info w AGENCY: SNFs  Permission granted to share info w Relationship: daughter  Permission granted to share info w Contact Information: [phone]  Emotional Assessment Appearance:: Other (Comment  Required(spoke with daughter  on phone) Attitude/Demeanor/Rapport: Unable to Assess Affect (typically observed): Unable to Assess Orientation: : Oriented to Self, Oriented to Place, Oriented to  Time, Oriented to Situation Alcohol / Substance Use: Not Applicable Psych Involvement: No (comment)  Admission diagnosis:  Stroke (cerebrum) (HCC) [I63.9] Acute right MCA stroke (HCC) [I63.511] Acute respiratory failure, unspecified whether with hypoxia or hypercapnia ([place]) [J96.00] Patient Active Problem List   Diagnosis Date Noted  . Stroke (cerebrum) ([place]) 10/21/2018  . 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 Recurrent right pleural effusion   . Acute systolic CHF (congestive heart failure) ([place])   . CHF exacerbation ([place]) 09/27/2018  . History of lung cancer 09/27/2018  . Acute on chronic respiratory failure with hypoxia ([place]) 09/27/2018  . Elevated brain natriuretic peptide (BNP) level 09/27/2018  . COPD (chronic obstructive pulmonary disease) ([place]) 09/27/2018  . Hyperlipidemia 09/27/2018  . Tobacco abuse 09/27/2018  . Non-small cell lung cancer, right ([place]) 08/26/2018   PCP:  Patient, No Pcp Per Pharmacy:   [facility] [place], [place] [address] Phone: [phone] Fax: [phone]     Social Determinants of Health (SDOH) Interventions    Readmission Risk Interventions No flowsheet data found.

## 2018-10-29 DIAGNOSIS — I5041 Acute combined systolic (congestive) and diastolic (congestive) heart failure: Secondary | ICD-10-CM

## 2018-10-29 DIAGNOSIS — I9589 Other hypotension: Secondary | ICD-10-CM

## 2018-10-29 LAB — CBC
HCT: 40.7 % (ref 39.0–52.0)
Hemoglobin: 13.3 g/dL (ref 13.0–17.0)
MCH: 28.9 pg (ref 26.0–34.0)
MCHC: 32.7 g/dL (ref 30.0–36.0)
MCV: 88.3 fL (ref 80.0–100.0)
Platelets: 335 10*3/uL (ref 150–400)
RBC: 4.61 MIL/uL (ref 4.22–5.81)
RDW: 14.9 % (ref 11.5–15.5)
WBC: 8.2 10*3/uL (ref 4.0–10.5)
nRBC: 0 % (ref 0.0–0.2)

## 2018-10-29 LAB — PROTIME-INR
INR: 2.2 — ABNORMAL HIGH (ref 0.8–1.2)
Prothrombin Time: 24.3 seconds — ABNORMAL HIGH (ref 11.4–15.2)

## 2018-10-29 LAB — BASIC METABOLIC PANEL WITH GFR
Anion gap: 7 (ref 5–15)
BUN: 17 mg/dL (ref 8–23)
CO2: 30 mmol/L (ref 22–32)
Calcium: 8.8 mg/dL — ABNORMAL LOW (ref 8.9–10.3)
Chloride: 104 mmol/L (ref 98–111)
Creatinine, Ser: 0.91 mg/dL (ref 0.61–1.24)
GFR calc Af Amer: >60 mL/min
GFR calc non Af Amer: >60 mL/min
Glucose, Bld: 87 mg/dL (ref 70–99)
Potassium: 4.1 mmol/L (ref 3.5–5.1)
Sodium: 141 mmol/L (ref 135–145)

## 2018-10-29 MED ORDER — SPIRONOLACTONE 25 MG PO TABS: 12.5000 mg | ORAL_TABLET | Freq: Every day | ORAL | 2 refills | Status: DC

## 2018-10-29 MED ORDER — DIAZEPAM 5 MG PO TABS: 5.0000 mg | ORAL_TABLET | ORAL | 0 refills | Status: DC | PRN

## 2018-10-29 MED ORDER — FUROSEMIDE 40 MG PO TABS: 40.0000 mg | ORAL_TABLET | Freq: Every day | ORAL | 2 refills | Status: DC

## 2018-10-29 MED ORDER — ATORVASTATIN CALCIUM 40 MG PO TABS: 40.0000 mg | ORAL_TABLET | Freq: Every day | ORAL | 2 refills | Status: DC

## 2018-10-29 MED ORDER — TRAMADOL HCL 50 MG PO TABS: 50.0000 mg | ORAL_TABLET | Freq: Three times a day (TID) | ORAL | 0 refills | Status: DC | PRN

## 2018-10-29 MED ORDER — DIGOXIN 125 MCG PO TABS: 0.1250 mg | ORAL_TABLET | Freq: Every day | ORAL | 2 refills | Status: DC

## 2018-10-29 MED ORDER — WARFARIN SODIUM 7.5 MG PO TABS: 7.5000 mg | ORAL_TABLET | Freq: Every day | ORAL | 2 refills | Status: DC

## 2018-10-29 NOTE — Progress Notes (Signed)
Report given to Morene Antu, LPN at [facility] .

## 2018-10-29 NOTE — TOC Progression Note (Signed)
Transition of Care [facility]) - Progression Note    Patient Details  Name: Jared Stevens MRN: [id number] Date of Birth: 05-06-1951  Transition of Care [facility]) CM/SW [place], [place] Phone Number: 10/29/2018, 10:07 AM  Clinical Narrative:    CSW spoke with pt at bedside. Pt alert but struggles to remember what was previously discussed with him. He keeps stating he did not have a stroke.   We discussed that his daughter feels he needs more rehab before coming home and he seems to grasp that although he asks repeatedly how long he will stay.   CSW reached out to [place] again this morning, we discussed when pt is medically ready then he will be discharged and that we will need to know which SNF she prefers. Pt daughter [facility] to hospital. CSW will let admissions director know in order to have paperwork completed and prepared. CSW will f/u with insurance company as well.    Expected Discharge Plan: [place] Barriers to Discharge: Continued Medical Work up, Ship broker  Expected Discharge Plan and Services Expected Discharge Plan: [place] In-house Referral: Clinical Social Work Discharge Planning Services: NA Post Acute Care Choice: [place] Living arrangements for the past 2 months: Apartment                                       Social Determinants of Health (SDOH) Interventions    Readmission Risk Interventions No flowsheet data found.

## 2018-10-29 NOTE — Progress Notes (Signed)
ANTICOAGULATION CONSULT NOTE   Pharmacy Consult for warfarin Indication: cardioembolic stroke  No Known Allergies  Patient Measurements: Height: 6\' 1"  (185.4 cm) Weight: 133 lb (60.3 kg) IBW/kg (Calculated) : 79.9  Vital Signs: Temp: 98.3 F (36.8 C) (06/02 0453) Temp Source: Oral (06/02 0453) BP: 109/76 (06/02 0835) Pulse Rate: 95 (06/02 0835)  Labs: Recent Labs    10/27/18 0446 10/28/18 0449 10/29/18 0500  HGB 14.6 14.4 13.3  HCT 43.0 43.3 40.7  PLT 314 341 335  LABPROT 18.3* 26.0* 24.3*  INR 1.5* 2.4* 2.2*  CREATININE 0.85 0.81 0.91   Estimated Creatinine Clearance: 67.2 mL/min (by C-G formula based on SCr of 0.91 mg/dL).  Medical History: Past Medical History:  Diagnosis Date  . Combined congestive systolic and diastolic heart failure ([place])   . COPD (chronic obstructive pulmonary disease) ([place])   . Hypercholesteremia   . Lung cancer ([place]) 03/2016   Assessment: Jared Stevens is a 68yo male admitted with R MCA infarct s/p TPA and thrombectomy on 5/25. Patient with EF 15% with impaired relaxation and severely reduced RV function. Pharmacy consulted to start warfarin.    Today, 10/29/18:  INR 1.2 > 1.3 > 1.5 > 2.4>2.2 (warfarin 5mg , 5mg , 7.5mg , 10mg , held)  CBC: H/H, Pltc WNL  PO intake: 100% of meals charted  Goal of Therapy:  INR 2-3 Monitor platelets by anticoagulation protocol: Yes     Plan:   Patient to discharge to SNF today per MD. Suggest warfarin 7.5mg  po daily with INR check on Thursday 6/4  Monitor closely for s/sx of bleeding   Sherlyne Crownover A. Levada Dy, PharmD, [place] Please utilize Amion for appropriate phone number to reach the unit pharmacist ([place])   10/29/2018 12:28 PM

## 2018-10-29 NOTE — Discharge Summary (Addendum)
Stroke Discharge Summary  Patient ID: Jared Stevens   MRN: [id number]      DOB: Dec 20, 1950  Date of Admission: 10/21/2018 Date of Discharge: 10/29/2018  Attending Physician:  Rosalin Hawking, MD, Stroke MD Consultant(s):   Treatment Team:  [facility] cardiology, Dr. Haroldine Laws and Dr. Acie Fredrickson interventional radiology Dr. 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 Combined congestive systolic and diastolic heart failure ([place])   . COPD (chronic obstructive pulmonary disease) ([place])   . Hypercholesteremia   . Lung cancer ([place]) 03/2016   Past Surgical History:  Procedure Laterality Date  . Arm Surgery    . HERNIA REPAIR    . IR ANGIO INTRA EXTRACRAN SEL COM CAROTID INNOMINATE UNI L MOD SED  10/21/2018  . IR ANGIO VERTEBRAL SEL SUBCLAVIAN INNOMINATE UNI R MOD SED  10/21/2018  . IR CT HEAD LTD  10/21/2018  . IR PERCUTANEOUS ART THROMBECTOMY/INFUSION INTRACRANIAL INC DIAG ANGIO  10/21/2018  . IR THORACENTESIS ASP PLEURAL SPACE W/IMG GUIDE  09/27/2018  . RADIOLOGY WITH ANESTHESIA N/A 10/21/2018   Procedure: RADIOLOGY WITH ANESTHESIA;  Surgeon: Luanne Bras, MD;  Location: [facility];  Service: Radiology;  Laterality: N/A;  . REPLANTATION THUMB    . RIGHT/LEFT HEART CATH AND CORONARY ANGIOGRAPHY N/A 10/01/2018   Procedure: RIGHT/LEFT HEART CATH AND CORONARY ANGIOGRAPHY;  Surgeon: Troy Sine, MD;  Location: [facility];  Service: Cardiovascular;  Laterality: N/A;    Allergies as of 10/29/2018   No Known Allergies     Medication List    STOP taking these medications   aspirin 81 MG chewable tablet   ibuprofen 800 MG tablet Commonly known as:  ADVIL     TAKE these medications   Albuterol Sulfate 2.5 MG/0.5ML Nebu Take 1 each by  nebulization every 6 (six) hours as needed (for wheezing). What changed:  Another medication with the same name was removed. Continue taking this medication, and follow the directions you see here.   amitriptyline 50 MG tablet Commonly known as:  ELAVIL Take 50 mg by mouth at bedtime.   atorvastatin 40 MG tablet Commonly known as:  LIPITOR Place 1 tablet (40 mg total) into feeding tube daily at 6 PM. What changed:    medication strength  how much to take  how to take this   carvedilol 3.125 MG tablet Commonly known as:  COREG Take 1 tablet (3.125 mg total) by mouth 2 (two) times daily with a meal.   diazepam 5 MG tablet Commonly known as:  VALIUM Take 5 mg by mouth every 4 (four) hours as needed for pain. Back pain   digoxin 0.125 MG tablet Commonly known as:  LANOXIN Take 1 tablet (0.125 mg total) by mouth daily. Start taking on:  October 30, 2018   furosemide 40 MG tablet Commonly known as:  LASIX Take 1 tablet (40 mg total) by mouth daily. Start taking on:  October 30, 2018   ipratropium 17 MCG/ACT inhaler Commonly known as:  ATROVENT HFA   latanoprost 0.005 % ophthalmic solution Commonly known as:  XALATAN Place 1 drop into both eyes 2 (two) times daily.   mometasone 220 MCG/INH inhaler Commonly known as:  ASMANEX   spironolactone 25 MG tablet Commonly known as:  ALDACTONE Take 0.5 tablets (12.5 mg total) by mouth daily. Start taking on:  October 30, 2018   traMADol 50 MG tablet Commonly known as:  Ultram Take 1 tablet (50 mg total) by mouth every 8 (eight) hours as needed.   warfarin 7.5 MG tablet Commonly known as:  Coumadin Take 1 tablet (7.5 mg total) by mouth daily.         LABORATORY STUDIES CBC    Component Value Date/Time   WBC 8.2 10/29/2018 0500   RBC 4.61 10/29/2018 0500   HGB 13.3 10/29/2018 0500   HGB 13.6 08/23/2018 1205   HCT 40.7 10/29/2018 0500   PLT 335 10/29/2018 0500   PLT 415 (H) 08/23/2018 1205   MCV 88.3 10/29/2018 0500   MCH  28.9 10/29/2018 0500   MCHC 32.7 10/29/2018 0500   RDW 14.9 10/29/2018 0500   LYMPHSABS 1.4 10/22/2018 0425   MONOABS 0.6 10/22/2018 0425   EOSABS 0.1 10/22/2018 0425   BASOSABS 0.0 10/22/2018 0425   CMP    Component Value Date/Time   NA 141 10/29/2018 0500   K 4.1 10/29/2018 0500   CL 104 10/29/2018 0500   CO2 30 10/29/2018 0500   GLUCOSE 87 10/29/2018 0500   BUN 17 10/29/2018 0500   CREATININE 0.91 10/29/2018 0500   CREATININE 0.82 08/23/2018 1205   CALCIUM 8.8 (L) 10/29/2018 0500   PROT 6.5 10/21/2018 1729   ALBUMIN 3.1 (L) 10/21/2018 1729   AST 23 10/21/2018 1729   AST 34 08/23/2018 1205   ALT 25 10/21/2018 1729   ALT 46 (H) 08/23/2018 1205   ALKPHOS 73 10/21/2018 1729   BILITOT 0.6 10/21/2018 1729   BILITOT 0.4 08/23/2018 1205   GFRNONAA >60 10/29/2018 0500   GFRNONAA >60 08/23/2018 1205   GFRAA >60 10/29/2018 0500   GFRAA >60 08/23/2018 1205   COAGS Lab Results  Component Value Date   INR 2.2 (H) 10/29/2018   INR 2.4 (H) 10/28/2018   INR 1.5 (H) 10/27/2018   Lipid Panel    Component Value Date/Time   CHOL 162 10/22/2018 0425   TRIG 118 10/23/2018 0323   HDL 51 10/22/2018 0425   CHOLHDL 3.2 10/22/2018 0425   VLDL 17 10/22/2018 0425   LDLCALC 94 10/22/2018 0425   HgbA1C  Lab Results  Component Value Date   HGBA1C 5.7 (H) 10/22/2018   Urinalysis    Component Value Date/Time   COLORURINE YELLOW 10/21/2018 1816   APPEARANCEUR CLEAR 10/21/2018 1816   LABSPEC 1.018 10/21/2018 1816   PHURINE 6.0 10/21/2018 1816   GLUCOSEU NEGATIVE 10/21/2018 1816   HGBUR SMALL (A) 10/21/2018 1816   BILIRUBINUR NEGATIVE 10/21/2018 1816   KETONESUR NEGATIVE 10/21/2018 1816   PROTEINUR NEGATIVE 10/21/2018 1816   NITRITE NEGATIVE 10/21/2018 1816   LEUKOCYTESUR NEGATIVE 10/21/2018 1816   Urine Drug Screen     Component Value Date/Time   LABOPIA NONE DETECTED 10/21/2018 1816   COCAINSCRNUR NONE DETECTED 10/21/2018 1816   LABBENZ POSITIVE (A) 10/21/2018 1816    AMPHETMU NONE DETECTED 10/21/2018 1816   THCU NONE DETECTED 10/21/2018 1816   LABBARB NONE DETECTED 10/21/2018 1816    Alcohol Level    Component Value Date/Time   ETH <10 10/21/2018 1727      SIGNIFICANT DIAGNOSTIC STUDIES CTA head and neck - reveals right M1 and M2 occlusionwith acute thrombusin thedistal right M1 segment extending into the M2 branches bilaterally. This is occlusive with minimal collateral  flow and poor opacification of right MCA branches. 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 46 mL   MRI scan of the brain : Acute patchy right MCA infarct involving right temporal lobe, right insula, basal ganglia and lateral frontal lobe. Old encephalomalacia in anterior frontal lobe likely from posttraumatic etiology   Interventional Radiology IMPRESSION: Status post endovascular revascularization of occluded right middle cerebral artery distal M1 segment, achieving TICI 2b revascularization, with 2 passes with the 5 mm x 33 mm Embotrap retrieval device.  ECHO  09/27/18 revealed estimatedleft ventricularejection fraction of 15%. The cavity size was moderately dilated. Left ventricular diastolic Doppler parameters are consistent with impaired relaxation. The right ventricle hadseverely reduced systolic function. The cavity was normal. There is no increase in right ventricular wall thickness. Trivial pericardial effusion waspresent.     HISTORY OF PRESENT ILLNESS Jared Stevens is an 68 y.o. male presenting with acute onset of left sided weakness with hemineglect, left facial droop, confusion and dysphasia. He has no prior history of stroke, ICH or seizure. Ambulates and is normally functioning at baseline.Family heard him fall in the bathroom and found him between the toilet and the wall, unable to get up and incontinent of urine.   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    Recent TTE from 09/27/18 revealed  estimated left ventricular ejection fraction of 15%. The cavity size was moderately dilated. Left ventricular diastolic Doppler parameters are consistent with impaired relaxation. The right ventricle had severely reduced systolic function. The cavity was normal. There is no increase in right ventricular wall thickness. Trivial pericardial effusion was present.   HOSPITAL COURSE [name] is a 68 y.o. male with history of ongoing tobacco use, COPD, CHF, cardiomyopathy (EF 15%), lung cancer, hx of substance abuse, and Hld presenting with acute onset of left sided weakness with hemineglect, left facial droop, confusion and dysphasia after falling in bathroom.   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   CT head - No acute intracranial pathology. No CT evidence of acute stroke or hemorrhage.   CTA H&N - right M1 and M2 occlusionwith acute thrombusin thedistal right M1 segment extending into the M2 branches bilaterally.  CTP positive for penumbra  IR with TICI 2b reperfusion  MRI head - Acute patchy right MCA infarct involving right temporal lobe, right insula, basal ganglia and lateral frontal lobe.  2D Echo - EF 15%  [employer] Virus 2  - negative  INR -1.2->1.3->1.5->2.4->2.2  LDL - 94  HgbA1c - 5.7  UDS - benzodiazepines  aspirin 81 mg daily prior to admission, now on warfarin daily.  INR at goal, will need to check INR again 10/31/18  Therapy recommendations:  SNF   Disposition:  Pending  Cardiomyopathy and CHF  ? Due to longstanding HTN/cocaine abuse/XRT effects following lung CA  EF 15% on 09/27/2018  Heart cath on 10/01/18  Likely the cause of current stroke  On Coumadin with INR goal 2-3  INR today 2.2, on coumadin 7.5mg , need to check INR again 10/31/18   Cardiac MRI today - severe LV EF 11%, RVEF 22%. Large B pleural effusions, small circumferential  peridardial effusion. No thrombus apical. Marked dyssynchrony btwn anterior and interior walls   Dr. Acie Fredrickson on board - continue coreg, digoxin, lasix and spironolactone   Follow up with Dr. Acie Fredrickson  NSVT  Found on  tele overnight with 24 beats of NSVT  Given EF 15%  Cardiology on board. Started on Mag ox  Not candidate for LifeVest  Continue coreg  Hypotension  BP stable but on the lower end due to cardiomyopathy  On Coreg, spironolactone and Lasix per cardiology  Long-term BP goal normotensive  Close monitoring  Hyperlipidemia  Lipid lowering medication PTA:  none  LDL 94, goal < 70  Current lipid lowering medication: Increase to Lipitor 40 mg daily as per cardiology  Continue statin at discharge  Tobacco abuse  Current smoker  Smoking cessation counseling provided  Pt is willing to quit  Other Stroke Risk Factors  Advanced age  Other Active Problems  Hypokalemia, resolved 3.4-3.7-4.1  History lung cancer status post surgery and radiation in 2018   DISCHARGE EXAM Blood pressure 109/76, pulse 95, temperature 98.3 F (36.8 C), temperature source Oral, resp. rate 15, height 6\' 1"  (1.854 m), weight 60.3 kg, SpO2 97 %.  Frail middle-aged African-American male who is not in distress.Poor dentition and missing teeth.Afebrile. Head is nontraumatic. Neck is supple without bruit. Cardiac exam no murmur or gallop. Lungs are clear to auscultation. Distal pulses are well felt. Neurological Exam: Patient is awake alert and interactive.Speech is mildly dysarthric, but could be d/t lack of teeth/poor dentition.No aphasia.  No gaze deviation, visual field full. EOMI. PERRL.He has mild left lower facial weakness. Tongue midline. BUE 4+/5 without drift noted. No drift in BLEs 4+/5. Sensation intact to LT throughout.Right plantars downgoing left is equivocal.  Bilateral FTN intact.  Discharge Diet    Diet Order            Diet Heart Room service  appropriate? Yes with Assist; Fluid consistency: Thin  Diet effective now             liquids  DISCHARGE PLAN  Disposition:  SNF  warfarin daily 7.5 for secondary stroke prevention, check INR regularly at SNF, need to check INR on 10/31/18. INR goal 2-3. Adjust dose as needed.  Ongoing stroke risk factor control by Primary Care Physician or MD at SNF  at time of discharge  Follow-up PCP in SNF in 2 weeks.  Follow-up in [facility] Dr. Leonie Man in 4 weeks, office to schedule an appointment.   Follow up with Dr. Acie Fredrickson cardiology in 2-3 weeks  Follow up with Dr. Estanislado Pandy Interventional Radiology in 4 weeks.  Compliant with medication  Avoid low BP  Heart healthy diet with thin liquid  40 minutes were spent preparing discharge.  Rosalin Hawking, MD PhD Stroke Neurology 10/29/2018 12:38 PM

## 2018-10-29 NOTE — Social Work (Signed)
Clinical Social Worker facilitated patient discharge including contacting patient family and facility to confirm patient discharge plans.  Clinical information faxed to facility and family agreeable with plan.  CSW arranged ambulance transport via [facility] to [place] [facility] at 3:30pm. RN to call [phone]  with report prior to discharge.  Clinical Social Worker will sign off for now as social work intervention is no longer needed. Please consult [place] again if new need arises.  Westley Hummer, MSW, [place] Social Worker [phone]

## 2018-10-29 NOTE — Plan of Care (Signed)
  Problem: Education: Goal: Knowledge of General Education information will improve Description Including pain rating scale, medication(s)/side effects and non-pharmacologic comfort measures Outcome: Progressing   Problem: Clinical Measurements: Goal: Cardiovascular complication will be avoided Outcome: Progressing   Problem: Activity: Goal: Risk for activity intolerance will decrease Outcome: Progressing   Problem: Nutrition: Goal: Adequate nutrition will be maintained Outcome: Progressing   Problem: Safety: Goal: Ability to remain free from injury will improve Outcome: Progressing   Problem: Skin Integrity: Goal: Risk for impaired skin integrity will decrease Outcome: Progressing

## 2018-10-29 NOTE — Progress Notes (Signed)
Progress Note  Patient Name: Jared Stevens Date of Encounter: 10/29/2018  Primary Cardiologist: Mertie Moores, MD   Subjective    68 yo with hx of acute / chronic combined chf.    Admitted with CVA Was see by Dr. Haroldine Laws yesterday  Has been started on  Coreg, digoxin, lasix, spiro Received TPA and thrombectomy for his CVA .     Has diuresed 8.1 liters so far this admission    Inpatient Medications    Scheduled Meds: . atorvastatin  40 mg Per Tube q1800  . carvedilol  3.125 mg Oral BID WC  . Chlorhexidine Gluconate Cloth  6 each Topical Daily  . Chlorhexidine Gluconate Cloth  6 each Topical Daily  . digoxin  0.125 mg Oral Daily  . furosemide  40 mg Oral Daily  . latanoprost  1 drop Both Eyes BID  . magnesium oxide  400 mg Oral BID  . pantoprazole  40 mg Oral QHS  . potassium chloride  20 mEq Oral TID  . sodium chloride flush  10-40 mL Intracatheter Q12H  . spironolactone  12.5 mg Oral Daily  . Warfarin - Pharmacist Dosing Inpatient   Does not apply q1800   Continuous Infusions: . sodium chloride 10 mL/hr at 10/25/18 0103   PRN Meds: acetaminophen **OR** acetaminophen (TYLENOL) oral liquid 160 mg/5 mL **OR** acetaminophen, albuterol, senna-docusate, sodium chloride flush   Vital Signs    Vitals:   10/28/18 1009 10/28/18 2033 10/29/18 0453 10/29/18 0835  BP: 108/74 98/65 112/77 109/76  Pulse: 75 97 92 95  Resp:  16 15   Temp:  98.4 F (36.9 C) 98.3 F (36.8 C)   TempSrc:  Oral Oral   SpO2: 99% 95% 97%   Weight:      Height:        Intake/Output Summary (Last 24 hours) at 10/29/2018 0911 Last data filed at 10/29/2018 0839 Gross per 24 hour  Intake 368 ml  Output 1200 ml  Net -832 ml   Last 3 Weights 10/21/2018 10/02/2018 10/01/2018  Weight (lbs) 133 lb 133 lb 9.6 oz 133 lb 14.4 oz  Weight (kg) 60.328 kg 60.601 kg 60.737 kg      Telemetry     NSR - Personally Reviewed  ECG    - Personally Reviewed  Physical Exam   Physical Exam: Blood pressure  109/76, pulse 95, temperature 98.3 F (36.8 C), temperature source Oral, resp. rate 15, height 6\' 1"  (1.854 m), weight 60.3 kg, SpO2 97 %.  GEN:  Thin, chronically ill appearing man.  NAD , poor dentition HEENT: Normal NECK: No JVD; No carotid bruits LYMPHATICS: No lymphadenopathy CARDIAC:   RR   RESPIRATORY:   Decreased breath sounds right base ABDOMEN: Soft, non-tender, non-distended MUSCULOSKELETAL:  No edema; No deformity ,  Very thin  SKIN: Warm and dry NEUROLOGIC:  Alert and oriented x 3 , able to talk but seems confused.   Cannot remember ,  Asked the same questions repeatedly .       Labs    Chemistry Recent Labs  Lab 10/27/18 0446 10/28/18 0449 10/29/18 0500  NA 138 137 141  K 3.7 4.1 4.1  CL 100 102 104  CO2 28 27 30   GLUCOSE 90 88 87  BUN 20 19 17   CREATININE 0.85 0.81 0.91  CALCIUM 8.8* 8.8* 8.8*  GFRNONAA >60 >60 >60  GFRAA >60 >60 >60  ANIONGAP 10 8 7      Hematology Recent Labs  Lab 10/27/18 0446 10/28/18  0449 10/29/18 0500  WBC 6.2 5.8 8.2  RBC 4.97 4.96 4.61  HGB 14.6 14.4 13.3  HCT 43.0 43.3 40.7  MCV 86.5 87.3 88.3  MCH 29.4 29.0 28.9  MCHC 34.0 33.3 32.7  RDW 14.6 14.6 14.9  PLT 314 341 335    Cardiac Enzymes Recent Labs  Lab 10/22/18 1208  TROPONINI 0.11*   No results for input(s): TROPIPOC in the last 168 hours.   BNPNo results for input(s): BNP, PROBNP in the last 168 hours.   DDimer No results for input(s): DDIMER in the last 168 hours.   Radiology    Mr Cardiac Morphology W Wo Contrast  Result Date: 10/28/2018 CLINICAL DATA:  Cardiomyopathy, Stroke EXAM: CARDIAC MRI TECHNIQUE: The patient was scanned on a 1.5 Tesla Siemens magnet. A dedicated cardiac coil was used. Functional imaging was done using Fiesta sequences. 2,3, and 4 chamber views were done to assess for RWMA's. Modified Simpson's rule using a short axis stack was used to calculate an ejection fraction on a dedicated work Conservation officer, nature. The patient  received Gadavist after 10 minutes inversion recovery sequences were used to assess for infiltration and scar tissue. CONTRAST:  Gadavist FINDINGS: There was mild LAE. Normal RA. No ASD/VSD. Dilated IVC. Normal aortic root. Normal appearing AV and MV. Mild appearing MR. Small pericardial effusion. Large bilateral pleural effusions. RV is mildly dilated and hypokinetic. The RVEF was 22% (EDV 165cc, ESV 129 cc SV 36 cc) The LV was severely dilated. There was diffuse hypokinesis worse in the inferior wall with marked anterior and inferior wall dyssynchrony The quantitative LVEF was 11% (EDV 234 cc ESV 209 cc SV 25 cc) There was no mural apical thrombus Delayed enhancement images showed full thickness inferior wall scar at mid and apical levels with small area of subendocardial scar in the apex. IMPRESSION: 1. 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  Mild RVE with hypokinesis RVEF 22% 3.  Small but circumferential pericardial effusion 4.  Large bilateral pleural effusions 5.  No mural apical thrombus 6. Full thickness scar involving the mid and apical inferior wall with small area of subendocardial scar at apex 7.  Mild MR 8.  Dilated IVC 9.  Marked dyssynchrony between the anterior and inferior walls [place] Electronically Signed   By: Jenkins Rouge M.D.   On: 10/28/2018 15:32    Cardiac Studies     Patient Profile     68 y.o. male with a nonischemic CM,  Admitted with a CVA   Assessment & Plan    1.  Chronic systolic CHF :   EF 38% by MRI.   Has an inferior scar.    Appears that his cardiomyopathy is due to comb of ischemic and nonischemic causes.    2.   NSVT.   - have replaced potassium and magnesium . I have discussed with Dr. Rayann Heman and with dr. Caryl Comes of EP.  Neither thinks he would benefit from a LifeVest.  DC on current dose of Coreg.    3.   CVA :   Now on warfarin .   INR is 2.2    [facility] will sign off.   Medication Recommendations:   Continue current meds.  Other recommendations (labs, testing, etc):   Follow up as an outpatient:  With primary MD and with Dr. Acie Fredrickson  Will need to follow up in our coumadin clinic also       For questions or  updates, please contact [place] Please consult [email or web address] for contact info under        Signed, Mertie Moores, MD  10/29/2018, 9:11 AM

## 2018-10-29 NOTE — TOC Transition Note (Signed)
Transition of Care [facility]) - CM/SW Discharge Note   Patient Details  Name: Jared Stevens MRN: [id number] Date of Birth: 11-04-1950  [facility]) CM/SW Contact:  Alexander Mt, [place] Phone Number: 10/29/2018, 2:29 PM   Clinical Narrative:    Approved for 3 days through 6/4, Ref [id number]. Updates to be faxed to [facility] at [phone]. Pt stable for transfer, pt daughter aware and will bring items to SNF. Pt given address etc for him to orient and reorient himself.   Await signatures for dc.    Final next level of care: Skilled Nursing Facility Barriers to Discharge: Barriers Resolved   Patient Goals and CMS Choice Patient states their goals for this hospitalization and ongoing recovery are:: for him to get rehab before going home CMS Medicare.gov Compare Post Acute Care list provided to:: Patient Represenative (must comment)(pt daughter) Choice offered to / list presented to : Adult Children  Discharge Placement PASRR number recieved: 10/28/18            Patient chooses bed at: [facility] Patient to be transferred to facility by: [facility] Name of family member notified: pt daughter Cyncirae Patient and family notified of of transfer: 10/29/18  Discharge Plan and Services In-house Referral: Clinical Social Work Discharge Planning Services: NA Post Acute Care Choice: [facility]                               Social Determinants of Health (SDOH) Interventions     Readmission Risk Interventions No flowsheet data found.

## 2018-10-30 ENCOUNTER — Encounter: Payer: Self-pay | Admitting: Internal Medicine

## 2018-10-30 ENCOUNTER — Non-Acute Institutional Stay (SKILLED_NURSING_FACILITY): Payer: Medicare HMO | Admitting: Internal Medicine

## 2018-10-30 DIAGNOSIS — I6601 Occlusion and stenosis of right middle cerebral artery: Secondary | ICD-10-CM

## 2018-10-30 DIAGNOSIS — G9340 Encephalopathy, unspecified: Secondary | ICD-10-CM | POA: Insufficient documentation

## 2018-10-30 DIAGNOSIS — I5021 Acute systolic (congestive) heart failure: Secondary | ICD-10-CM | POA: Diagnosis not present

## 2018-10-30 DIAGNOSIS — J90 Pleural effusion, not elsewhere classified: Secondary | ICD-10-CM

## 2018-10-30 NOTE — Patient Instructions (Signed)
See assessment and plan under each diagnosis in the problem list and acutely for this visit 

## 2018-10-30 NOTE — Progress Notes (Signed)
NURSING HOME LOCATION:  [facility] ROOM NUMBER:  310/B  CODE STATUS:  DNR  PCP:  NONE  This is a comprehensive admission note to [facility] performed on this date less than 30 days from date of admission. Included are preadmission medical/surgical history; reconciled medication list; family history; social history and comprehensive review of systems.  Corrections and additions to the records were documented. Comprehensive physical exam was also performed. Additionally a clinical summary was entered for each active diagnosis pertinent to this admission in the Problem List to enhance continuity of care.  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  Presentation was acute onset of left-sided weakness with hemineglect, left facial droop, confusion, and dysphasia.  Family had heard him fall in the bathroom and found him between the toilet and the wall unable to arise.  He was incontinent of urine.  There was no mention of any seizure activity.   This was  treated with IV TPA and IR with TICI 2b reperfusion.  The acute MRI of the brain had revealed old acute patchy right MCA infarct involving the right temporal lobe, right insula, basal ganglia, and the lateral frontal lobe.  There was evidence of old encephalomalacia in the anterior frontal lobe most likely from remote trauma.   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  Echo on 5/1 had revealed a left ventricular ejection fraction of 15%.  There was moderate dilation of the left ventricle.  Right ventricle has severely reduced systolic function. Telemetry did reveal 24 beats of NSVT overnight.  Mag oxide was initiated.  He was not a candidate for LifeVest.  Coreg was to be continued.  Past medical and surgical history: Includes dyslipidemia, COPD, history of substance abuse and history of lung cancer. Past surgeries include  re-implantation of the thumb, coronary artery angiography and IR thoracentesis. He is status post chemotherapy and radiation in 2018 for adenocarcinoma of the lung.  Social history: He was an active smoker at the time of admission.  Currently nondrinker.  There is a history of substance abuse.  When his sister was informed of his behavior at the SNF; her comment was " its probably the addiction".  Family history: Reviewed   Review of systems: Here at the facility the patient is unable to cooperate with PT/OT.  The staff indicated they felt some discomfort as he appeared to be somewhat aggressive.  Social services stated that the patient kept repeating " I need water".  Although she stated she would provide water he did continue to attempt to leave the quarantine room.  It is noted that he is on Valium 5 mg every 4 hours as needed; the indication is listed as "back pain". His history is very vague and he does not answer most questions.  When I asked him if he had had "a lung problem" he acted as if he did not hear me.  He did not validate having had lung cancer.  He seemed to indicate that his lungs are "okay" as long as he does not hear expiratory rhonchi.  He indicated this by exhibiting a coarse expiratory maneuver.  He was also very unclear as to cigarette consumption.  It sounded as if he smoked a third of a pack per day prior to admission.  He was aware he had a stroke.   He was intermittently agitated stating that he had to go to the bank to get  money to pay his landlord.  Initially he was unable to tell me why I was wearing a mask and shield.  Finally he did mention "corona".  He understands e is on warfarin ,but he simply named escalating dosages nonsensically.  Physical exam:  Pertinent or positive findings: Pattern alopecia is present.  His head appears to be shaven.  He has intermittent exotropia of the right eye.  He has multiple missing teeth.  Chest was surprisingly clear although he does  have minimal low-grade rales and mild bronchovesicular character especially on the right.  Exhibits a tachycardia heard best at the epigastrium.  Heart sounds are distant.  Pedal pulses are decreased.  He has clubbing the nailbeds.  Initially he seemed to neglect the left lower extremity but was able to move it.  He appears to have adequate strength to opposition in all extremities without significant asymmetry.  General appearance:no acute distress, increased work of breathing is present.   Lymphatic: No lymphadenopathy about the head, neck, axilla. Eyes: No conjunctival inflammation or lid edema is present. There is no scleral icterus. Ears:  External ear exam shows no significant lesions or deformities.   Nose:  External nasal examination shows no deformity or inflammation. Nasal mucosa are pink and moist without lesions, exudates Oral exam: Lips and gums are healthy appearing.There is no oropharyngeal erythema or exudate. Neck:  No thyromegaly, masses, tenderness noted.    Heart:  No gallop, murmur, click, rub.  Abdomen: Bowel sounds are normal.  Abdomen is soft and nontender with no organomegaly, hernias, masses. GU: Deferred  Extremities:  No cyanosis,edema. Neurologic exam: Balance, Rhomberg, finger to nose testing could not be completed due to clinical state Skin: Warm & dry w/o tenting. No significant lesions or rash.  See clinical summary under each active problem in the Problem List with associated updated therapeutic plan

## 2018-10-30 NOTE — Assessment & Plan Note (Addendum)
Nicotine patch Decrease high dose Amitriptyline & Valium prn Seroquel trial Psych NP consult

## 2018-10-30 NOTE — Assessment & Plan Note (Addendum)
10/30/2018 no peripheral edema; no evidence of respiratory compromise when supine

## 2018-10-30 NOTE — Assessment & Plan Note (Signed)
On warfarin; PT/INR due 6/4.

## 2018-10-31 NOTE — Assessment & Plan Note (Signed)
No respiratory compromise on exam Continue pulmonary toilet

## 2018-11-01 ENCOUNTER — Other Ambulatory Visit (HOSPITAL_COMMUNITY): Payer: Self-pay | Admitting: Interventional Radiology

## 2018-11-01 ENCOUNTER — Encounter: Payer: Self-pay | Admitting: Adult Health

## 2018-11-01 ENCOUNTER — Non-Acute Institutional Stay (SKILLED_NURSING_FACILITY): Payer: Medicare HMO | Admitting: Adult Health

## 2018-11-01 DIAGNOSIS — I63411 Cerebral infarction due to embolism of right middle cerebral artery: Secondary | ICD-10-CM | POA: Diagnosis not present

## 2018-11-01 DIAGNOSIS — Z7901 Long term (current) use of anticoagulants: Secondary | ICD-10-CM

## 2018-11-01 DIAGNOSIS — I639 Cerebral infarction, unspecified: Secondary | ICD-10-CM

## 2018-11-01 NOTE — Progress Notes (Signed)
Location:  [place] Room Number: 310B Place of Service:  SNF (31) Provider:  Durenda Age, DNP, FNP-BC  Patient Care Team: Patient, No Pcp Per as PCP - General (General Practice) Nahser, Wonda Cheng, MD as PCP - Cardiology (Cardiology)  Extended Emergency Contact Information Primary Emergency Contact: Wildon, [place] Mobile Phone: [phone] Relation: Daughter Secondary Emergency Contact: [place] Mobile Phone: [phone] Relation: Daughter  Code Status:  Full Code  Goals of care: Advanced Directive information Advanced Directives 10/30/2018  Does Patient Have a Medical Advance Directive? Yes  Type of Advance Directive (No Data)  Does patient want to make changes to medical advance directive? No - Patient declined  Would patient like information on creating a medical advance directive? No - Patient declined     Chief Complaint  Patient presents with   Acute Visit    Coumadin therapy    HPI:  Pt is a 68 y.o. male seen today for Coumadin therapy. 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 The INR goal is 2-3. Latest INR 1.5, subtherapeutic. He was seen eating his lunch with good appetite.   Past Medical History:  Diagnosis Date   Combined congestive systolic and diastolic heart failure (HCC)    COPD (chronic obstructive pulmonary disease) ([place])    Hypercholesteremia    Lung cancer ([place]) 03/2016   Past Surgical History:  Procedure Laterality Date   Arm Surgery     HERNIA REPAIR     IR ANGIO INTRA EXTRACRAN SEL COM CAROTID INNOMINATE UNI L MOD SED  10/21/2018   IR ANGIO VERTEBRAL SEL SUBCLAVIAN INNOMINATE UNI R MOD SED  10/21/2018   IR CT HEAD LTD  10/21/2018   IR PERCUTANEOUS ART THROMBECTOMY/INFUSION INTRACRANIAL INC DIAG ANGIO  10/21/2018   IR THORACENTESIS ASP PLEURAL SPACE W/IMG GUIDE  09/27/2018   RADIOLOGY WITH ANESTHESIA N/A 10/21/2018   Procedure: RADIOLOGY WITH  ANESTHESIA;  Surgeon: Luanne Bras, MD;  Location: [place];  Service: Radiology;  Laterality: N/A;   REPLANTATION THUMB     RIGHT/LEFT HEART CATH AND CORONARY ANGIOGRAPHY N/A 10/01/2018   Procedure: RIGHT/LEFT HEART CATH AND CORONARY ANGIOGRAPHY;  Surgeon: Troy Sine, MD;  Location: [place] [facility];  Service: Cardiovascular;  Laterality: N/A;    No Known Allergies  Outpatient Encounter Medications as of 11/01/2018  Medication Sig   amitriptyline (ELAVIL) 50 MG tablet Take 50 mg by mouth at bedtime.   atorvastatin (LIPITOR) 40 MG tablet Place 1 tablet (40 mg total) into feeding tube daily at 6 PM.   carvedilol (COREG) 3.125 MG tablet Take 1 tablet (3.125 mg total) by mouth 2 (two) times daily with a meal.   diazepam (VALIUM) 5 MG tablet Take 1 tablet (5 mg total) by mouth every 4 (four) hours as needed. Back pain   furosemide (LASIX) 40 MG tablet Take 1 tablet (40 mg total) by mouth daily.   ipratropium (ATROVENT HFA) 17 MCG/ACT inhaler Administer two puffs into the lungs every six hours as needed for cough, congestion   latanoprost (XALATAN) 0.005 % ophthalmic solution Place 1 drop into both eyes 2 (two) times daily.    mometasone (ASMANEX) 220 MCG/INH inhaler Administer two puffs into the lungs twice a day for COPD Rinse after use. Shake inhaler before use   spironolactone (ALDACTONE) 25 MG tablet Take 0.5 tablets (12.5 mg total) by mouth daily.   traMADol (ULTRAM) 50 MG tablet Take 1 tablet (50 mg total) by mouth every  8 (eight) hours as needed.   warfarin (COUMADIN) 7.5 MG tablet Take 1 tablet (7.5 mg total) by mouth daily.   No facility-administered encounter medications on file as of 11/01/2018.     Review of Systems  GENERAL: No change in appetite, no fatigue, no weight changes, no fever, chills or weakness MOUTH and THROAT: Denies oral discomfort, gingival pain or bleeding RESPIRATORY: no cough, SOB, DOE, wheezing, hemoptysis CARDIAC: No chest pain, edema or  palpitations GI: No abdominal pain, diarrhea, constipation, heart burn, nausea or vomiting GU: Denies dysuria, frequency, hematuria, incontinence, or discharge NEUROLOGICAL: Denies dizziness, syncope, numbness, or headache PSYCHIATRIC: Denies feelings of depression or anxiety. No report of hallucinations, insomnia, paranoia, or agitation    There is no immunization history on file for this patient. Pertinent  Health Maintenance Due  Topic Date Due   COLONOSCOPY  11/29/2018 (Originally 04/02/2001)   PNA vac Low Risk Adult (1 of 2 - PCV13) 11/29/2018 (Originally 04/02/2016)   INFLUENZA VACCINE  12/28/2018   Fall Risk  05/06/2018  Falls in the past year? 1  Comment tripped on the curb  Number falls in past yr: 0  Injury with Fall? 1  Comment broke his collar bone, hands and chest now numb     Vitals:   11/01/18 2310  BP: 129/89  Pulse: 64  Resp: 15  Temp: 98.2 F (36.8 C)  Weight: 133 lb (60.3 kg)  Height: 6\' 1"  (1.854 m)   Body mass index is 17.55 kg/m.  Physical Exam  GENERAL APPEARANCE:  In no acute distress.  SKIN:  Skin is warm and dry.  MOUTH and THROAT: Lips are without lesions. Oral mucosa is moist and without lesions.  RESPIRATORY: Breathing is even & unlabored, BS CTAB CARDIAC: RRR, no murmur,no extra heart sounds, no edema GI: Abdomen soft, normal BS, no masses, no tenderness EXTREMITIES: Able to move X 4 extremities NEUROLOGICAL: There is no tremor. Speech is clear.  PSYCHIATRIC:  Affect and behavior are appropriate  Labs reviewed: Recent Labs    10/22/18 0425  10/24/18 0539  10/27/18 0446 10/27/18 1645 10/28/18 0449 10/29/18 0500  NA 143   < > 143   < > 138  --  137 141  K 3.6   < > 3.1*   < > 3.7  --  4.1 4.1  CL 108   < > 108   < > 100  --  102 104  CO2 22   < > 27   < > 28  --  27 30  GLUCOSE 92   < > 95   < > 90  --  88 87  BUN 12   < > 19   < > 20  --  19 17  CREATININE 0.97   < > 0.93   < > 0.85  --  0.81 0.91  CALCIUM 9.0   < > 8.1*    < > 8.8*  --  8.8* 8.8*  MG 1.7   < > 1.8  --   --  2.0 1.9  --   PHOS 3.2  --  2.8  --   --   --   --   --    < > = values in this interval not displayed.   Recent Labs    09/27/18 0102 09/29/18 0449 10/21/18 1729  AST 62* 31 23  ALT 51* 35 25  ALKPHOS 79 73 73  BILITOT 0.6 0.6 0.6  PROT 6.2* 5.6* 6.5  ALBUMIN  3.3* 2.7* 3.1*   Recent Labs    09/27/18 0102  10/21/18 1729  10/22/18 0425  10/27/18 0446 10/28/18 0449 10/29/18 0500  WBC 7.2   < > 6.1  --  9.1   < > 6.2 5.8 8.2  NEUTROABS 4.9  --  3.8  --  7.0  --   --   --   --   HGB 14.0   < > 14.7   < > 14.5   < > 14.6 14.4 13.3  HCT 43.0   < > 44.5   < > 42.4   < > 43.0 43.3 40.7  MCV 89.4   < > 88.5  --  86.2   < > 86.5 87.3 88.3  PLT 320   < > 333  --  352   < > 314 341 335   < > = values in this interval not displayed.   Lab Results  Component Value Date   TSH 0.789 09/28/2018   Lab Results  Component Value Date   HGBA1C 5.7 (H) 10/22/2018   Lab Results  Component Value Date   CHOL 162 10/22/2018   HDL 51 10/22/2018   LDLCALC 94 10/22/2018   TRIG 118 10/23/2018   CHOLHDL 3.2 10/22/2018    Significant Diagnostic Results in last 30 days:  Ct Code Stroke Cta Head W/wo Contrast  Result Date: 10/21/2018 CLINICAL DATA:  Stroke.  Left-sided weakness. EXAM: CT ANGIOGRAPHY HEAD AND NECK CT PERFUSION BRAIN TECHNIQUE: Multidetector CT imaging of the head and neck was performed using the standard protocol during bolus administration of intravenous contrast. Multiplanar CT image reconstructions and MIPs were obtained to evaluate the vascular anatomy. Carotid stenosis measurements (when applicable) are obtained utilizing NASCET criteria, using the distal internal carotid diameter as the denominator. Multiphase CT imaging of the brain was performed following IV bolus contrast injection. Subsequent parametric perfusion maps were calculated using RAPID software. CONTRAST:  143mL OMNIPAQUE IOHEXOL 350 MG/ML SOLN COMPARISON:  CT  head 10/21/2018 FINDINGS: CTA NECK FINDINGS Aortic arch: Standard branching. Proximal great vessels widely patent. Decreased attenuation descending thoracic aorta likely due to beam attenuation from a large right pleural effusion. Ascending aorta normal in caliber and opacification. Right carotid system: Right carotid system widely patent without stenosis. Mild atherosclerotic calcification right internal carotid artery below the skull base. Carotid bifurcation normal Left carotid system: Left carotid bifurcation widely patent without stenosis. No significant atherosclerotic disease. Vertebral arteries: Both vertebral arteries widely patent to the basilar without stenosis. Skeleton: Cervical spine spondylosis.  No acute bony abnormality Other neck: Negative for mass or adenopathy in the neck. Upper chest: Large right pleural effusion. Patient has a history of lung cancer. Review of the MIP images confirms the above findings CTA HEAD FINDINGS Anterior circulation: Cavernous carotid widely patent bilaterally with mild atherosclerotic calcification. Right M1 segment patent proximally. There is thrombus in the distal right M1 segment. There is occlusive thrombus in right M2 branches with poor collateral flow. Minimal opacification of right middle cerebral artery branches. Azygos anterior cerebral artery which supplies both pericallosal arteries without stenosis. Left middle cerebral artery widely patent. Posterior circulation: Both vertebral arteries patent to the basilar. PICA patent bilaterally. Basilar widely patent. Superior cerebellar and posterior cerebral arteries patent bilaterally. Fetal origin right posterior cerebral artery. Venous sinuses: Minimal venous contrast due to arterial phase scanning Anatomic variants: None Delayed phase: Not performed Review of the MIP images confirms the above findings CT Brain Perfusion Findings: ASPECTS: 10 CBF (<30%) Volume: 9mL  Perfusion (Tmax>6.0s) volume: 245mL Mismatch  Volume: 133mL Infarction Location:Right MCA territory involving the frontotemporal and parietal lobe. IMPRESSION: 1. Acute thrombus distal right M1 segment extending into the M2 branches bilaterally. This is occlusive with minimal collateral flow and poor opacification of right MCA branches. 2. No other significant intracranial stenosis or occlusion 3. Carotid bifurcation widely patent bilaterally. Both vertebral arteries patent. 4. 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 5. These results were called by telephone at the time of interpretation on 10/21/2018 at 6:17 pm to Dr. Kerney Elbe , who verbally acknowledged these results. Electronically Signed   By: Franchot Gallo M.D.   On: 10/21/2018 18:19   Ct Head Wo Contrast  Result Date: 10/22/2018 CLINICAL DATA:  Follow-up stroke EXAM: CT HEAD WITHOUT CONTRAST TECHNIQUE: Contiguous axial images were obtained from the base of the skull through the vertex without intravenous contrast. COMPARISON:  10/21/2018 FINDINGS: Brain: No evidence of acute infarction, hemorrhage, hydrocephalus, extra-axial collection or mass lesion/mass effect. Periventricular white matter hypodensity. Vascular: No hyperdense vessel or unexpected calcification. Skull: Normal. Negative for fracture or focal lesion. Sinuses/Orbits: No acute finding. Other: None. IMPRESSION: No acute intracranial pathology. No CT evidence of acute stroke or hemorrhage. Small-vessel white matter disease. Electronically Signed   By: Eddie Candle M.D.   On: 10/22/2018 16:08   Ct Code Stroke Cta Neck W/wo Contrast  Result Date: 10/21/2018 CLINICAL DATA:  Stroke.  Left-sided weakness. EXAM: CT ANGIOGRAPHY HEAD AND NECK CT PERFUSION BRAIN TECHNIQUE: Multidetector CT imaging of the head and neck was performed using the standard protocol during bolus administration of intravenous contrast. Multiplanar CT image reconstructions and MIPs were obtained to evaluate the vascular  anatomy. Carotid stenosis measurements (when applicable) are obtained utilizing NASCET criteria, using the distal internal carotid diameter as the denominator. Multiphase CT imaging of the brain was performed following IV bolus contrast injection. Subsequent parametric perfusion maps were calculated using RAPID software. CONTRAST:  149mL OMNIPAQUE IOHEXOL 350 MG/ML SOLN COMPARISON:  CT head 10/21/2018 FINDINGS: CTA NECK FINDINGS Aortic arch: Standard branching. Proximal great vessels widely patent. Decreased attenuation descending thoracic aorta likely due to beam attenuation from a large right pleural effusion. Ascending aorta normal in caliber and opacification. Right carotid system: Right carotid system widely patent without stenosis. Mild atherosclerotic calcification right internal carotid artery below the skull base. Carotid bifurcation normal Left carotid system: Left carotid bifurcation widely patent without stenosis. No significant atherosclerotic disease. Vertebral arteries: Both vertebral arteries widely patent to the basilar without stenosis. Skeleton: Cervical spine spondylosis.  No acute bony abnormality Other neck: Negative for mass or adenopathy in the neck. Upper chest: Large right pleural effusion. Patient has a history of lung cancer. Review of the MIP images confirms the above findings CTA HEAD FINDINGS Anterior circulation: Cavernous carotid widely patent bilaterally with mild atherosclerotic calcification. Right M1 segment patent proximally. There is thrombus in the distal right M1 segment. There is occlusive thrombus in right M2 branches with poor collateral flow. Minimal opacification of right middle cerebral artery branches. Azygos anterior cerebral artery which supplies both pericallosal arteries without stenosis. Left middle cerebral artery widely patent. Posterior circulation: Both vertebral arteries patent to the basilar. PICA patent bilaterally. Basilar widely patent. Superior  cerebellar and posterior cerebral arteries patent bilaterally. Fetal origin right posterior cerebral artery. Venous sinuses: Minimal venous contrast due to arterial phase scanning Anatomic variants: None Delayed phase: Not performed Review of the MIP images confirms the above findings CT  Brain Perfusion Findings: ASPECTS: 10 CBF (<30%) Volume: 61mL Perfusion (Tmax>6.0s) volume: 225mL Mismatch Volume: 124mL Infarction Location:Right MCA territory involving the frontotemporal and parietal lobe. IMPRESSION: 1. Acute thrombus distal right M1 segment extending into the M2 branches bilaterally. This is occlusive with minimal collateral flow and poor opacification of right MCA branches. 2. No other significant intracranial stenosis or occlusion 3. Carotid bifurcation widely patent bilaterally. Both vertebral arteries patent. 4. 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 5. These results were called by telephone at the time of interpretation on 10/21/2018 at 6:17 pm to Dr. Kerney Elbe , who verbally acknowledged these results. Electronically Signed   By: Franchot Gallo M.D.   On: 10/21/2018 18:19   Mr Brain Wo Contrast  Result Date: 10/23/2018 CLINICAL DATA:  Stroke follow-up.  Left-sided weakness EXAM: MRI HEAD WITHOUT CONTRAST TECHNIQUE: Multiplanar, multiecho pulse sequences of the brain and surrounding structures were obtained without intravenous contrast. COMPARISON:  Head CT from yesterday FINDINGS: Brain: Acute infarction in the superficial right temporal lobe, right insula, basal ganglia, and lateral frontal lobe. No acute infarcts seen in a separate distribution. No hemorrhagic conversion or significant mass effect. Mild or moderate chronic microvascular ischemic change seen in the cerebral white matter. Anterior right frontal lobe the encephalomalacia that may be posttraumatic given location and orbital findings. Vascular: Major flow voids are preserved. Skull and  upper cervical spine: Negative for marrow lesion. Sinuses/Orbits: Chronic sinusitis with mild mucosal thickening. Remote medial orbital blowout fracture on the right. Retention cysts in the right maxillary sinus. IMPRESSION: 1. 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 2. Anterior right frontal lobe encephalomalacia, potentially posttraumatic given old facial fractures. Electronically Signed   By: Monte Fantasia M.D.   On: 10/23/2018 06:38   [place]  Result Date: 10/23/2018 INDICATION: Left-sided weakness with right gaze preference and dysarthria. 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 EXAM: 1. EMERGENT LARGE VESSEL OCCLUSION THROMBOLYSIS anterior CIRCULATION) COMPARISON:  CT angiogram of the head and neck of 10/21/2015. MEDICATIONS: 2 g Ancef IV antibiotic was administered within 1 hour of the procedure. ANESTHESIA/SEDATION: General anesthesia. CONTRAST:  Isovue 300 approximately 100 mL. FLUOROSCOPY TIME:  Fluoroscopy Time: 50 minutes 46 seconds (3279 mGy). COMPLICATIONS: None immediate. TECHNIQUE: Following a full explanation of the procedure along with the potential associated complications, an informed witnessed consent was obtained from the patient's sister and daughter. The risks of intracranial hemorrhage of 10%, worsening neurological deficit, ventilator dependency, death and inability to revascularize were all reviewed in detail with the patient's sister and daughter. The patient was then put under general anesthesia by the Department of Anesthesiology at [facility]. The right groin was prepped and draped in the usual sterile fashion. Thereafter using modified Seldinger technique, transfemoral access into the right common femoral artery was obtained without difficulty. Over a 0.035 inch guidewire a 5 French Pinnacle sheath was inserted. Through this, and also over a 0.035 inch guidewire a 5 [place] JB 1 catheter was  advanced to the aortic arch region and selectively positioned in the innominate artery artery and the right common carotid artery and the left common carotid artery. FINDINGS: Innominate artery angiogram demonstrates the right subclavian artery and the right common carotid artery origins to be widely patent. The right vertebral artery is patent. The vessel is seen to opacify to the cranial skull base. Opacification is seen of the right vertebrobasilar junction and  the right posterior-inferior cerebellar artery. The opacified portion of the basilar artery, the posterior cerebral arteries and the superior cerebellar arteries is grossly intact on the lateral DSA images. Unopacified blood is seen in the basilar artery from the contralateral vertebral artery. The right common carotid arteriogram demonstrates wide patency of the right external carotid artery and its major branches. The right internal carotid artery at the bulb to the cranial skull base demonstrates wide patency. The petrous, cavernous and supraclinoid segments are widely patent. A right posterior communicating artery is seen opacifying the right posterior cerebral distribution. The right anterior cerebral artery opacifies into the capillary and venous phases. The right middle cerebral artery opacifies to the proximal right M1 segment with diminutive branches of the right MCA trifurcation. PROCEDURE: The diagnostic JB 1 catheter in right common carotid artery was then exchanged over a 0.035 inch 300 cm Rosen exchange guidewire for a 55 cm 8 French Brite tip neurovascular sheath. Good aspiration was obtained from the side port of the neurovascular sheath. This was then connected to continuous heparinized saline infusion. Over the [facility] exchange guidewire, a 95 cm 8 [place] FlowGate guide catheter which had been prepped with 50% contrast and 50% heparinized saline infusion was advanced and positioned in the distal right internal carotid artery. The guidewire  was removed. Good aspiration obtained from the hub of the [facility] guide catheter. A gentle contrast injection demonstrated no evidence of spasms, dissections or of intraluminal filling defects. Over a 0.014 inch Softip Synchro micro guidewire, a combination of a 132 cm 6 [place] Catalyst guide catheter inside of which was an 021Trevo ProVue microcatheter was advanced to the supraclinoid right ICA. The micro guidewire was then gently manipulated using a torque device into the M2 M3 region of the inferior division. The guidewire was removed. Good aspiration obtained from the hub of the microcatheter. A gentle contrast injection demonstrates safe position of tip of the microcatheter. This was then connected to continuous heparinized saline infusion. A 5 mm x 33 mm Embotrap retrieval device was then advanced to the distal end of the microcatheter. The Catalyst guide catheter was advanced into the mid M1 segment of the right middle cerebral artery. The O ring on the delivery microcatheter was then loosened. With slight forward gentle traction with the right hand on the delivery micro guidewire, with the left hand the delivery microcatheter was retrieved unsheathing the retrieval device. The 6 [place] Catalyst guide catheter was then advanced to oppose and engage the proximal portion of the clot. With proximal flow arrest in the right internal carotid artery by inflating the balloon of the FlowGate guide catheter, a combination of the retrieval device, the microcatheter, and the 6 [place] Catalyst guide catheter was then retrieved proximally as constant aspiration was applied with a 60 mL syringe at the hub of the [facility] guide catheter, and a Penumbra aspiration device at the hub of the 6 [place] Catalyst guide catheter. The combination was retrieved and removed. Flow arrest was reversed in the right internal carotid artery. A control arteriogram performed through the [facility] guide catheter in the right internal carotid  artery demonstrated now angiographic complete revascularization of the right middle cerebral M1 segment, and also a very dominant inferior division with multiple branches. A TICI 2b revascularization had been achieved. There continued to be lack of visibility of the previously seen diminutive superior division branches. The combination of the Trevo ProVue microcatheter, the 6 [place] Catalyst guide catheter over a 0.014 inch Softip Synchro micro guidewire was  then advanced to the right middle cerebral artery M1 segment. The micro guidewire was then exchanged for a 016 Fathom micro guidewire. The micro guidewire was then gently manipulated with a torque device to advance into the diminutive superior division into the M2 region. This was then followed by the microcatheter. The guidewire was removed. There was good aspiration at the hub of the microcatheter. Gentle control arteriogram performed demonstrated slow antegrade flow into these diminutive branches. A 5 mm x 33 mm Embotrap retrieval device was again advanced and deployed such that it covered the M2 region of superior division of the right middle cerebral artery. Again with constant aspiration through the Catalyst guide catheter at the origin of the superior division, and proximal flow arrest, the combination of the retrieval device, and the microcatheter in the 6 [place] Catalyst guide catheter were retrieved and removed as constant aspiration was applied at the hub of the [facility] guide catheter with a 60 mL syringe and Penumbra aspiration catheter at the hub of the 6 [place] Catalyst guide catheter. Following reversal of flow, flow arrest a control arteriogram performed through the [employer] guide catheter demonstrated minimal opacification of the superior division of the right middle cerebral artery. Patient was given 25 mcg of nitroglycerin intra-arterially x3 in order to relieve the vasospasm with mild benefit. No further attempt was made to target the  diminutive superior division branches as the patient had already received IV tPA. A final control arteriogram performed through the 8 [place] FlowGate guide catheter in the right common carotid artery demonstrated wide patency of the right internal carotid artery extra cranially and intracranially. There continued to be patency of the right middle cerebral artery branches and the right anterior cerebral artery branch into the capillary and venous phases. The area supplied by the diminutive branches of the superior division appeared to be gradually recanalizing. The right posterior communicating artery with opacification of both posterior cerebral arteries continued to be present. The left common carotid arteriogram demonstrated the left external carotid artery and its major branches to be widely patent. The left internal carotid artery at the bulb to the cranial skull base also demonstrates wide patency. The petrous, cavernous and supraclinoid segments demonstrate wide patency as well. Cross-filling via the anterior communicating artery of the right anterior cerebral A2 segment and distally was noted transiently. During the procedure, no evidence of extravasation or of mass effect or midline shift was noted. The 8 [place] FlowGate guide catheter was then retrieved into the abdominal aorta as was the 55 cm Brite tip neurovascular sheath. These were then exchanged over a J-tip guidewire for an 8 [place] Pinnacle sheath. An 8 French Pinnacle sheath was converted to a 7 [place] Pinnacle sheath in order to maintain antegrade flow into the right common femoral artery trifurcation regions. At the end of the procedure, a flat panel CT of the brain demonstrated no evidence of gross mass effect, midline shift or of intracranial hemorrhage. Distal pulses remained Dopplerable in the dorsalis pedis and the posterior tibial regions bilaterally in both feet at the end of the procedure unchanged from prior to the procedure. The patient  was then transported to the neuro ICU for post thrombectomy care. IMPRESSION: Status post endovascular revascularization of occluded right middle cerebral artery distal M1 segment, achieving TICI 2b revascularization, with 2 passes with the 5 mm x 33 mm Embotrap retrieval device. PLAN: Follow-up in the clinic 4-6 weeks post discharge. Electronically Signed   By: Luanne Bras M.D.   On: 10/22/2018  15:40   Ct Code Stroke Cta Cerebral Perfusion W/wo Contrast  Result Date: 10/21/2018 CLINICAL DATA:  Stroke.  Left-sided weakness. EXAM: CT ANGIOGRAPHY HEAD AND NECK CT PERFUSION BRAIN TECHNIQUE: Multidetector CT imaging of the head and neck was performed using the standard protocol during bolus administration of intravenous contrast. Multiplanar CT image reconstructions and MIPs were obtained to evaluate the vascular anatomy. Carotid stenosis measurements (when applicable) are obtained utilizing NASCET criteria, using the distal internal carotid diameter as the denominator. Multiphase CT imaging of the brain was performed following IV bolus contrast injection. Subsequent parametric perfusion maps were calculated using RAPID software. CONTRAST:  173mL OMNIPAQUE IOHEXOL 350 MG/ML SOLN COMPARISON:  CT head 10/21/2018 FINDINGS: CTA NECK FINDINGS Aortic arch: Standard branching. Proximal great vessels widely patent. Decreased attenuation descending thoracic aorta likely due to beam attenuation from a large right pleural effusion. Ascending aorta normal in caliber and opacification. Right carotid system: Right carotid system widely patent without stenosis. Mild atherosclerotic calcification right internal carotid artery below the skull base. Carotid bifurcation normal Left carotid system: Left carotid bifurcation widely patent without stenosis. No significant atherosclerotic disease. Vertebral arteries: Both vertebral arteries widely patent to the basilar without stenosis. Skeleton: Cervical spine spondylosis.  No  acute bony abnormality Other neck: Negative for mass or adenopathy in the neck. Upper chest: Large right pleural effusion. Patient has a history of lung cancer. Review of the MIP images confirms the above findings CTA HEAD FINDINGS Anterior circulation: Cavernous carotid widely patent bilaterally with mild atherosclerotic calcification. Right M1 segment patent proximally. There is thrombus in the distal right M1 segment. There is occlusive thrombus in right M2 branches with poor collateral flow. Minimal opacification of right middle cerebral artery branches. Azygos anterior cerebral artery which supplies both pericallosal arteries without stenosis. Left middle cerebral artery widely patent. Posterior circulation: Both vertebral arteries patent to the basilar. PICA patent bilaterally. Basilar widely patent. Superior cerebellar and posterior cerebral arteries patent bilaterally. Fetal origin right posterior cerebral artery. Venous sinuses: Minimal venous contrast due to arterial phase scanning Anatomic variants: None Delayed phase: Not performed Review of the MIP images confirms the above findings CT Brain Perfusion Findings: ASPECTS: 10 CBF (<30%) Volume: 1mL Perfusion (Tmax>6.0s) volume: 256mL Mismatch Volume: 156mL Infarction Location:Right MCA territory involving the frontotemporal and parietal lobe. IMPRESSION: 1. Acute thrombus distal right M1 segment extending into the M2 branches bilaterally. This is occlusive with minimal collateral flow and poor opacification of right MCA branches. 2. No other significant intracranial stenosis or occlusion 3. Carotid bifurcation widely patent bilaterally. Both vertebral arteries patent. 4. 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 5. These results were called by telephone at the time of interpretation on 10/21/2018 at 6:17 pm to Dr. Kerney Elbe , who verbally acknowledged these results. Electronically Signed   By: Franchot Gallo  M.D.   On: 10/21/2018 18:19   Dg Chest Port 1 View  Result Date: 10/24/2018 CLINICAL DATA:  67 year old male with a history of respiratory failure EXAM: PORTABLE CHEST 1 VIEW COMPARISON:  10/23/2018 FINDINGS: Cardiomediastinal silhouette unchanged in size and contour. Right heart border partially obscured by overlying lung/pleural disease. Increasing opacity at the right lung base with obscuration the right hemidiaphragm. Left lung relatively well aerated. Interval removal of the endotracheal tube. Interval removal of gastric tube. Unchanged left upper extremity PICC. IMPRESSION: Increasing opacity at the right lung base likely a combination of enlarging pleural fluid and associated atelectasis/consolidation. Interval removal of endotracheal  tube and gastric tube. Unchanged PICC Electronically Signed   By: Corrie Mckusick D.O.   On: 10/24/2018 07:47   Dg Chest Port 1 View  Result Date: 10/23/2018 CLINICAL DATA:  Acute respiratory failure EXAM: PORTABLE CHEST 1 VIEW COMPARISON:  10/22/2018 FINDINGS: Support devices are stable. Moderate/large right pleural effusion and right base atelectasis, stable. Cardiomegaly. Left lung clear. IMPRESSION: Stable right effusion with right lower lobe atelectasis. Cardiomegaly. Electronically Signed   By: Rolm Baptise M.D.   On: 10/23/2018 08:18   Portable Chest Xray  Result Date: 10/22/2018 CLINICAL DATA:  COPD, lung cancer, pleural effusion EXAM: PORTABLE CHEST 1 VIEW COMPARISON:  10/21/2018 FINDINGS: Endotracheal tube and NG tube are in place. Moderate to large right pleural effusion is stable since prior study. Right lower lobe atelectasis or infiltrate is stable. Left lung clear. No acute bony abnormality. IMPRESSION: Moderate to large-sized right pleural effusion with right lower lobe atelectasis or infiltrate, stable. Electronically Signed   By: Rolm Baptise M.D.   On: 10/22/2018 08:10   Dg Chest Portable 1 View  Result Date: 10/21/2018 CLINICAL DATA:  Intubation  EXAM: PORTABLE CHEST 1 VIEW COMPARISON:  09/27/2018 FINDINGS: The endotracheal tube terminates above the carina by approximately 6 cm. There is a multiloculated right-sided pleural effusion which has increased in size from prior study. No evidence of a right-sided pneumothorax. The left lung field is mostly clear. IMPRESSION: 1. Endotracheal tube as above. 2. Interval increase in size of the right-sided pleural effusion. Electronically Signed   By: Constance Holster M.D.   On: 10/21/2018 18:55   Dg Abd Portable 1v  Result Date: 10/21/2018 CLINICAL DATA:  Orogastric tube placement EXAM: PORTABLE ABDOMEN - 1 VIEW COMPARISON:  None. FINDINGS: Orogastric tube tip and side port are in the stomach. There is moderate stool in the colon. There are loops of mildly dilated colon which likely is indicative of a degree of ileus. Bowel obstruction not felt to be likely. No free air. There is a right pleural effusion with consolidation in the right lung base. IMPRESSION: Orogastric tube tip and side port in stomach. Suspect a degree of ileus. No free air. Right pleural effusion and right base consolidation noted. Electronically Signed   By: Lowella Grip III M.D.   On: 10/21/2018 23:25   Mr Cardiac Morphology W Wo Contrast  Result Date: 10/28/2018 CLINICAL DATA:  Cardiomyopathy, Stroke EXAM: CARDIAC MRI TECHNIQUE: The patient was scanned on a 1.5 Tesla Siemens magnet. A dedicated cardiac coil was used. Functional imaging was done using Fiesta sequences. 2,3, and 4 chamber views were done to assess for RWMA's. Modified Simpson's rule using a short axis stack was used to calculate an ejection fraction on a dedicated work Conservation officer, nature. The patient received Gadavist after 10 minutes inversion recovery sequences were used to assess for infiltration and scar tissue. CONTRAST:  Gadavist FINDINGS: There was mild LAE. Normal RA. No ASD/VSD. Dilated IVC. Normal aortic root. Normal appearing AV and MV. Mild  appearing MR. Small pericardial effusion. Large bilateral pleural effusions. RV is mildly dilated and hypokinetic. The RVEF was 22% (EDV 165cc, ESV 129 cc SV 36 cc) The LV was severely dilated. There was diffuse hypokinesis worse in the inferior wall with marked anterior and inferior wall dyssynchrony The quantitative LVEF was 11% (EDV 234 cc ESV 209 cc SV 25 cc) There was no mural apical thrombus Delayed enhancement images showed full thickness inferior wall scar at mid and apical levels with small area of subendocardial  scar in the apex. IMPRESSION: 1. 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  Mild RVE with hypokinesis RVEF 22% 3.  Small but circumferential pericardial effusion 4.  Large bilateral pleural effusions 5.  No mural apical thrombus 6. Full thickness scar involving the mid and apical inferior wall with small area of subendocardial scar at apex 7.  Mild MR 8.  Dilated IVC 9.  Marked dyssynchrony between the anterior and inferior walls [place] Electronically Signed   By: [place] Rouge M.D.   On: 10/28/2018 15:32   Vas [place] Groin Pseudoaneurysm  Result Date: 10/24/2018  ARTERIAL PSEUDOANEURYSM  Exam: Right groin Indications: Patient complains of palpable knot. History: S/p catheterization. Performing Technologist: Maudry Mayhew MHA, RDMS, RVT, RDCS  Examination Guidelines: A complete evaluation includes B-mode imaging, spectral Doppler, color Doppler, and power Doppler as needed of all accessible portions of each vessel. Bilateral testing is considered an integral part of a complete examination. Limited examinations for reoccurring indications may be performed as noted. +------------+----------+----------+------+----------+  Right Duplex PSV (cm/s)  Waveform  Plaque Comment(s)  +------------+----------+----------+------+----------+  CFA              31     triphasic                     +------------+----------+----------+------+----------+  PTA              8       monophasic                    +------------+----------+----------+------+----------+ Right Vein comments:Common femoral vein is patent with phasic flow.  Findings: Multiple heterogenous areas are noted in the groin with low-resistant arterial vascularity. This is suggestive of possible prominent lymph nodes versus unknown etiology.  Summary: No evidence of pseudoaneurysm, AVF or DVT  Diagnosing physician: Servando Snare MD Electronically signed by Servando Snare MD on 10/24/2018 at 12:41:04 PM.   --------------------------------------------------------------------------------    Final    Ir Percutaneous Art Thrombectomy/infusion Intracranial Inc Diag Angio  Result Date: 10/23/2018 INDICATION: Left-sided weakness with right gaze preference and dysarthria. 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 EXAM: 1. EMERGENT LARGE VESSEL OCCLUSION THROMBOLYSIS anterior CIRCULATION) COMPARISON:  CT angiogram of the head and neck of 10/21/2015. MEDICATIONS: 2 g Ancef IV antibiotic was administered within 1 hour of the procedure. ANESTHESIA/SEDATION: General anesthesia. CONTRAST:  Isovue 300 approximately 100 mL. FLUOROSCOPY TIME:  Fluoroscopy Time: 50 minutes 46 seconds (3279 mGy). COMPLICATIONS: None immediate. TECHNIQUE: Following a full explanation of the procedure along with the potential associated complications, an informed witnessed consent was obtained from the patient's sister and daughter. The risks of intracranial hemorrhage of 10%, worsening neurological deficit, ventilator dependency, death and inability to revascularize were all reviewed in detail with the patient's sister and daughter. The patient was then put under general anesthesia by the Department of Anesthesiology at [facility]. The right groin was prepped and draped in the usual sterile fashion. Thereafter using modified Seldinger technique, transfemoral access into the right common femoral artery was obtained without difficulty. Over  a 0.035 inch guidewire a 5 French Pinnacle sheath was inserted. Through this, and also over a 0.035 inch guidewire a 5 [place] JB 1 catheter was advanced to the aortic arch region and selectively positioned in the innominate artery artery and the right common carotid artery and the left common carotid artery. FINDINGS: Innominate artery angiogram demonstrates the right subclavian artery and the  right common carotid artery origins to be widely patent. The right vertebral artery is patent. The vessel is seen to opacify to the cranial skull base. Opacification is seen of the right vertebrobasilar junction and the right posterior-inferior cerebellar artery. The opacified portion of the basilar artery, the posterior cerebral arteries and the superior cerebellar arteries is grossly intact on the lateral DSA images. Unopacified blood is seen in the basilar artery from the contralateral vertebral artery. The right common carotid arteriogram demonstrates wide patency of the right external carotid artery and its major branches. The right internal carotid artery at the bulb to the cranial skull base demonstrates wide patency. The petrous, cavernous and supraclinoid segments are widely patent. A right posterior communicating artery is seen opacifying the right posterior cerebral distribution. The right anterior cerebral artery opacifies into the capillary and venous phases. The right middle cerebral artery opacifies to the proximal right M1 segment with diminutive branches of the right MCA trifurcation. PROCEDURE: The diagnostic JB 1 catheter in right common carotid artery was then exchanged over a 0.035 inch 300 cm Rosen exchange guidewire for a 55 cm 8 [place] Brite tip neurovascular sheath. Good aspiration was obtained from the side port of the neurovascular sheath. This was then connected to continuous heparinized saline infusion. Over the [facility] exchange guidewire, a 95 cm 8 [place] FlowGate guide catheter which had been  prepped with 50% contrast and 50% heparinized saline infusion was advanced and positioned in the distal right internal carotid artery. The guidewire was removed. Good aspiration obtained from the hub of the [facility] guide catheter. A gentle contrast injection demonstrated no evidence of spasms, dissections or of intraluminal filling defects. Over a 0.014 inch Softip Synchro micro guidewire, a combination of a 132 cm 6 [place] Catalyst guide catheter inside of which was an 021Trevo ProVue microcatheter was advanced to the supraclinoid right ICA. The micro guidewire was then gently manipulated using a torque device into the M2 M3 region of the inferior division. The guidewire was removed. Good aspiration obtained from the hub of the microcatheter. A gentle contrast injection demonstrates safe position of tip of the microcatheter. This was then connected to continuous heparinized saline infusion. A 5 mm x 33 mm Embotrap retrieval device was then advanced to the distal end of the microcatheter. The Catalyst guide catheter was advanced into the mid M1 segment of the right middle cerebral artery. The O ring on the delivery microcatheter was then loosened. With slight forward gentle traction with the right hand on the delivery micro guidewire, with the left hand the delivery microcatheter was retrieved unsheathing the retrieval device. The 6 [place] Catalyst guide catheter was then advanced to oppose and engage the proximal portion of the clot. With proximal flow arrest in the right internal carotid artery by inflating the balloon of the FlowGate guide catheter, a combination of the retrieval device, the microcatheter, and the 6 [place] Catalyst guide catheter was then retrieved proximally as constant aspiration was applied with a 60 mL syringe at the hub of the [facility] guide catheter, and a Penumbra aspiration device at the hub of the 6 [place] Catalyst guide catheter. The combination was retrieved and removed. Flow arrest  was reversed in the right internal carotid artery. A control arteriogram performed through the [facility] guide catheter in the right internal carotid artery demonstrated now angiographic complete revascularization of the right middle cerebral M1 segment, and also a very dominant inferior division with multiple branches. A TICI 2b revascularization had been achieved. There continued  to be lack of visibility of the previously seen diminutive superior division branches. The combination of the Trevo ProVue microcatheter, the 6 [place] Catalyst guide catheter over a 0.014 inch Softip Synchro micro guidewire was then advanced to the right middle cerebral artery M1 segment. The micro guidewire was then exchanged for a 016 Fathom micro guidewire. The micro guidewire was then gently manipulated with a torque device to advance into the diminutive superior division into the M2 region. This was then followed by the microcatheter. The guidewire was removed. There was good aspiration at the hub of the microcatheter. Gentle control arteriogram performed demonstrated slow antegrade flow into these diminutive branches. A 5 mm x 33 mm Embotrap retrieval device was again advanced and deployed such that it covered the M2 region of superior division of the right middle cerebral artery. Again with constant aspiration through the Catalyst guide catheter at the origin of the superior division, and proximal flow arrest, the combination of the retrieval device, and the microcatheter in the 6 [place] Catalyst guide catheter were retrieved and removed as constant aspiration was applied at the hub of the [facility] guide catheter with a 60 mL syringe and Penumbra aspiration catheter at the hub of the 6 [place] Catalyst guide catheter. Following reversal of flow, flow arrest a control arteriogram performed through the [facility] guide catheter demonstrated minimal opacification of the superior division of the right middle cerebral artery. Patient was  given 25 mcg of nitroglycerin intra-arterially x3 in order to relieve the vasospasm with mild benefit. No further attempt was made to target the diminutive superior division branches as the patient had already received IV tPA. A final control arteriogram performed through the 8 [place] FlowGate guide catheter in the right common carotid artery demonstrated wide patency of the right internal carotid artery extra cranially and intracranially. There continued to be patency of the right middle cerebral artery branches and the right anterior cerebral artery branch into the capillary and venous phases. The area supplied by the diminutive branches of the superior division appeared to be gradually recanalizing. The right posterior communicating artery with opacification of both posterior cerebral arteries continued to be present. The left common carotid arteriogram demonstrated the left external carotid artery and its major branches to be widely patent. The left internal carotid artery at the bulb to the cranial skull base also demonstrates wide patency. The petrous, cavernous and supraclinoid segments demonstrate wide patency as well. Cross-filling via the anterior communicating artery of the right anterior cerebral A2 segment and distally was noted transiently. During the procedure, no evidence of extravasation or of mass effect or midline shift was noted. The 8 [place] FlowGate guide catheter was then retrieved into the abdominal aorta as was the 55 cm Brite tip neurovascular sheath. These were then exchanged over a J-tip guidewire for an 8 [place] Pinnacle sheath. An 8 French Pinnacle sheath was converted to a 7 [place] Pinnacle sheath in order to maintain antegrade flow into the right common femoral artery trifurcation regions. At the end of the procedure, a flat panel CT of the brain demonstrated no evidence of gross mass effect, midline shift or of intracranial hemorrhage. Distal pulses remained Dopplerable in the  dorsalis pedis and the posterior tibial regions bilaterally in both feet at the end of the procedure unchanged from prior to the procedure. The patient was then transported to the neuro ICU for post thrombectomy care. IMPRESSION: Status post endovascular revascularization of occluded right middle cerebral artery distal M1 segment, achieving TICI 2b revascularization,  with 2 passes with the 5 mm x 33 mm Embotrap retrieval device. PLAN: Follow-up in the clinic 4-6 weeks post discharge. Electronically Signed   By: Luanne Bras M.D.   On: 10/22/2018 15:40   Ct Head Code Stroke Wo Contrast  Result Date: 10/21/2018 CLINICAL DATA:  Code stroke.  Stroke.  Left-sided weakness aphasia EXAM: CT HEAD WITHOUT CONTRAST TECHNIQUE: Contiguous axial images were obtained from the base of the skull through the vertex without intravenous contrast. COMPARISON:  CT head 11/13/2017 FINDINGS: Brain: Generalized atrophy. Chronic ischemic changes in the white matter. Chronic infarct right anterior frontal lobe. No evidence of acute infarct, hemorrhage, mass Vascular: Hyperdense right MCA compatible with acute thrombus. This appears to be in the distal right M1 and M2 segments Skull: Negative Sinuses/Orbits: Chronic fracture right medial orbit. Chronic fracture left zygomatic arch. Cyst in the right maxillary sinus. No orbital mass lesion. Other: None ASPECTS ([place] Stroke Program Early CT Score) - Ganglionic level infarction (caudate, lentiform nuclei, internal capsule, insula, M1-M3 cortex): 7 - Supraganglionic infarction (M4-M6 cortex): 3 Total score (0-10 with 10 being normal): 10 IMPRESSION: 1. Atrophy and chronic ischemic change. No acute infarct or hemorrhage 2. Hyperdense right MCA compatible with acute thrombus. 3. ASPECTS is 10 4. These results were called by telephone at the time of interpretation on 10/21/2018 at 5:48 pm to Dr. Cheral Marker , who verbally acknowledged these results. Electronically Signed   By: Franchot Gallo  M.D.   On: 10/21/2018 17:49   [place] Ekg Site Rite  Result Date: 10/22/2018 If Site Rite image not attached, placement could not be confirmed due to current cardiac rhythm.  Ir Angio Intra Extracran Sel Com Carotid Innominate Uni L Mod Sed  Result Date: 10/23/2018 INDICATION: Left-sided weakness with right gaze preference and dysarthria. 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 EXAM: 1. EMERGENT LARGE VESSEL OCCLUSION THROMBOLYSIS anterior CIRCULATION) COMPARISON:  CT angiogram of the head and neck of 10/21/2015. MEDICATIONS: 2 g Ancef IV antibiotic was administered within 1 hour of the procedure. ANESTHESIA/SEDATION: General anesthesia. CONTRAST:  Isovue 300 approximately 100 mL. FLUOROSCOPY TIME:  Fluoroscopy Time: 50 minutes 46 seconds (3279 mGy). COMPLICATIONS: None immediate. TECHNIQUE: Following a full explanation of the procedure along with the potential associated complications, an informed witnessed consent was obtained from the patient's sister and daughter. The risks of intracranial hemorrhage of 10%, worsening neurological deficit, ventilator dependency, death and inability to revascularize were all reviewed in detail with the patient's sister and daughter. The patient was then put under general anesthesia by the Department of Anesthesiology at [facility]. The right groin was prepped and draped in the usual sterile fashion. Thereafter using modified Seldinger technique, transfemoral access into the right common femoral artery was obtained without difficulty. Over a 0.035 inch guidewire a 5 French Pinnacle sheath was inserted. Through this, and also over a 0.035 inch guidewire a 5 [place] JB 1 catheter was advanced to the aortic arch region and selectively positioned in the innominate artery artery and the right common carotid artery and the left common carotid artery. FINDINGS: Innominate artery angiogram demonstrates the right subclavian artery and the right common carotid  artery origins to be widely patent. The right vertebral artery is patent. The vessel is seen to opacify to the cranial skull base. Opacification is seen of the right vertebrobasilar junction and the right posterior-inferior cerebellar artery. The opacified portion of the basilar artery, the posterior cerebral arteries and the superior cerebellar arteries is grossly intact on the  lateral DSA images. Unopacified blood is seen in the basilar artery from the contralateral vertebral artery. The right common carotid arteriogram demonstrates wide patency of the right external carotid artery and its major branches. The right internal carotid artery at the bulb to the cranial skull base demonstrates wide patency. The petrous, cavernous and supraclinoid segments are widely patent. A right posterior communicating artery is seen opacifying the right posterior cerebral distribution. The right anterior cerebral artery opacifies into the capillary and venous phases. The right middle cerebral artery opacifies to the proximal right M1 segment with diminutive branches of the right MCA trifurcation. PROCEDURE: The diagnostic JB 1 catheter in right common carotid artery was then exchanged over a 0.035 inch 300 cm Rosen exchange guidewire for a 55 cm 8 French Brite tip neurovascular sheath. Good aspiration was obtained from the side port of the neurovascular sheath. This was then connected to continuous heparinized saline infusion. Over the [facility] exchange guidewire, a 95 cm 8 [place] FlowGate guide catheter which had been prepped with 50% contrast and 50% heparinized saline infusion was advanced and positioned in the distal right internal carotid artery. The guidewire was removed. Good aspiration obtained from the hub of the [facility] guide catheter. A gentle contrast injection demonstrated no evidence of spasms, dissections or of intraluminal filling defects. Over a 0.014 inch Softip Synchro micro guidewire, a combination of a 132 cm 6  [place] Catalyst guide catheter inside of which was an 021Trevo ProVue microcatheter was advanced to the supraclinoid right ICA. The micro guidewire was then gently manipulated using a torque device into the M2 M3 region of the inferior division. The guidewire was removed. Good aspiration obtained from the hub of the microcatheter. A gentle contrast injection demonstrates safe position of tip of the microcatheter. This was then connected to continuous heparinized saline infusion. A 5 mm x 33 mm Embotrap retrieval device was then advanced to the distal end of the microcatheter. The Catalyst guide catheter was advanced into the mid M1 segment of the right middle cerebral artery. The O ring on the delivery microcatheter was then loosened. With slight forward gentle traction with the right hand on the delivery micro guidewire, with the left hand the delivery microcatheter was retrieved unsheathing the retrieval device. The 6 [place] Catalyst guide catheter was then advanced to oppose and engage the proximal portion of the clot. With proximal flow arrest in the right internal carotid artery by inflating the balloon of the FlowGate guide catheter, a combination of the retrieval device, the microcatheter, and the 6 [place] Catalyst guide catheter was then retrieved proximally as constant aspiration was applied with a 60 mL syringe at the hub of the [facility] guide catheter, and a Penumbra aspiration device at the hub of the 6 [place] Catalyst guide catheter. The combination was retrieved and removed. Flow arrest was reversed in the right internal carotid artery. A control arteriogram performed through the [facility] guide catheter in the right internal carotid artery demonstrated now angiographic complete revascularization of the right middle cerebral M1 segment, and also a very dominant inferior division with multiple branches. A TICI 2b revascularization had been achieved. There continued to be lack of visibility of the  previously seen diminutive superior division branches. The combination of the Trevo ProVue microcatheter, the 6 [place] Catalyst guide catheter over a 0.014 inch Softip Synchro micro guidewire was then advanced to the right middle cerebral artery M1 segment. The micro guidewire was then exchanged for a 016 Fathom micro guidewire. The micro guidewire was  then gently manipulated with a torque device to advance into the diminutive superior division into the M2 region. This was then followed by the microcatheter. The guidewire was removed. There was good aspiration at the hub of the microcatheter. Gentle control arteriogram performed demonstrated slow antegrade flow into these diminutive branches. A 5 mm x 33 mm Embotrap retrieval device was again advanced and deployed such that it covered the M2 region of superior division of the right middle cerebral artery. Again with constant aspiration through the Catalyst guide catheter at the origin of the superior division, and proximal flow arrest, the combination of the retrieval device, and the microcatheter in the 6 [place] Catalyst guide catheter were retrieved and removed as constant aspiration was applied at the hub of the [facility] guide catheter with a 60 mL syringe and Penumbra aspiration catheter at the hub of the 6 [place] Catalyst guide catheter. Following reversal of flow, flow arrest a control arteriogram performed through the [facility] guide catheter demonstrated minimal opacification of the superior division of the right middle cerebral artery. Patient was given 25 mcg of nitroglycerin intra-arterially x3 in order to relieve the vasospasm with mild benefit. No further attempt was made to target the diminutive superior division branches as the patient had already received IV tPA. A final control arteriogram performed through the 8 [place] FlowGate guide catheter in the right common carotid artery demonstrated wide patency of the right internal carotid artery extra  cranially and intracranially. There continued to be patency of the right middle cerebral artery branches and the right anterior cerebral artery branch into the capillary and venous phases. The area supplied by the diminutive branches of the superior division appeared to be gradually recanalizing. The right posterior communicating artery with opacification of both posterior cerebral arteries continued to be present. The left common carotid arteriogram demonstrated the left external carotid artery and its major branches to be widely patent. The left internal carotid artery at the bulb to the cranial skull base also demonstrates wide patency. The petrous, cavernous and supraclinoid segments demonstrate wide patency as well. Cross-filling via the anterior communicating artery of the right anterior cerebral A2 segment and distally was noted transiently. During the procedure, no evidence of extravasation or of mass effect or midline shift was noted. The 8 [place] FlowGate guide catheter was then retrieved into the abdominal aorta as was the 55 cm Brite tip neurovascular sheath. These were then exchanged over a J-tip guidewire for an 8 [place] Pinnacle sheath. An 8 French Pinnacle sheath was converted to a 7 [place] Pinnacle sheath in order to maintain antegrade flow into the right common femoral artery trifurcation regions. At the end of the procedure, a flat panel CT of the brain demonstrated no evidence of gross mass effect, midline shift or of intracranial hemorrhage. Distal pulses remained Dopplerable in the dorsalis pedis and the posterior tibial regions bilaterally in both feet at the end of the procedure unchanged from prior to the procedure. The patient was then transported to the neuro ICU for post thrombectomy care. IMPRESSION: Status post endovascular revascularization of occluded right middle cerebral artery distal M1 segment, achieving TICI 2b revascularization, with 2 passes with the 5 mm x 33 mm Embotrap  retrieval device. PLAN: Follow-up in the clinic 4-6 weeks post discharge. Electronically Signed   By: Luanne Bras M.D.   On: 10/22/2018 15:40   Ir Angio Vertebral Sel Subclavian Innominate Uni R Mod Sed  Result Date: 10/23/2018 INDICATION: Left-sided weakness with right gaze preference and dysarthria.  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  EXAM: 1. EMERGENT LARGE VESSEL OCCLUSION THROMBOLYSIS anterior CIRCULATION)  COMPARISON:  CT angiogram of the head and neck of 10/21/2015.  MEDICATIONS: 2 g Ancef IV antibiotic was administered within 1 hour of the procedure.  ANESTHESIA/SEDATION: General anesthesia.  CONTRAST:  Isovue 300 approximately 100 mL.  FLUOROSCOPY TIME:  Fluoroscopy Time: 50 minutes 46 seconds (3279 mGy).  COMPLICATIONS: None immediate.  TECHNIQUE: Following a full explanation of the procedure along with the potential associated complications, an informed witnessed consent was obtained from the patient's sister and daughter. The risks of intracranial hemorrhage of 10%, worsening neurological deficit, ventilator dependency, death and inability to revascularize were all reviewed in detail with the patient's sister and daughter.  The patient was then put under general anesthesia by the Department of Anesthesiology at [facility].  The right groin was prepped and draped in the usual sterile fashion. Thereafter using modified Seldinger technique, transfemoral access into the right common femoral artery was obtained without difficulty. Over a 0.035 inch guidewire a 5 French Pinnacle sheath was inserted. Through this, and also over a 0.035 inch guidewire a 5 [place] JB 1 catheter was advanced to the aortic arch region and selectively positioned in the innominate artery artery and the right common carotid artery and the left common carotid artery.  FINDINGS: Innominate artery angiogram demonstrates the right subclavian artery and the right common carotid artery  origins to be widely patent.  The right vertebral artery is patent. The vessel is seen to opacify to the cranial skull base. Opacification is seen of the right vertebrobasilar junction and the right posterior-inferior cerebellar artery.  The opacified portion of the basilar artery, the posterior cerebral arteries and the superior cerebellar arteries is grossly intact on the lateral DSA images. Unopacified blood is seen in the basilar artery from the contralateral vertebral artery.  The right common carotid arteriogram demonstrates wide patency of the right external carotid artery and its major branches. The right internal carotid artery at the bulb to the cranial skull base demonstrates wide patency.  The petrous, cavernous and supraclinoid segments are widely patent.  A right posterior communicating artery is seen opacifying the right posterior cerebral distribution.  The right anterior cerebral artery opacifies into the capillary and venous phases. The right middle cerebral artery opacifies to the proximal right M1 segment with diminutive branches of the right MCA trifurcation.  PROCEDURE: The diagnostic JB 1 catheter in right common carotid artery was then exchanged over a 0.035 inch 300 cm Rosen exchange guidewire for a 55 cm 8 French Brite tip neurovascular sheath. Good aspiration was obtained from the side port of the neurovascular sheath. This was then connected to continuous heparinized saline infusion.  Over the [facility] exchange guidewire, a 95 cm 8 [place] FlowGate guide catheter which had been prepped with 50% contrast and 50% heparinized saline infusion was advanced and positioned in the distal right internal carotid artery. The guidewire was removed. Good aspiration obtained from the hub of the [facility] guide catheter. A gentle contrast injection demonstrated no evidence of spasms, dissections or of intraluminal filling defects. Over a 0.014 inch Softip Synchro micro guidewire, a combination of a  132 cm 6 [place] Catalyst guide catheter inside of which was an 021Trevo ProVue microcatheter was advanced to the supraclinoid right ICA.  The micro guidewire was then gently manipulated using a torque device into the M2 M3 region of the inferior division. The guidewire was removed. Good aspiration obtained from  the hub of the microcatheter. A gentle contrast injection demonstrates safe position of tip of the microcatheter. This was then connected to continuous heparinized saline infusion. A 5 mm x 33 mm Embotrap retrieval device was then advanced to the distal end of the microcatheter. The Catalyst guide catheter was advanced into the mid M1 segment of the right middle cerebral artery.  The O ring on the delivery microcatheter was then loosened. With slight forward gentle traction with the right hand on the delivery micro guidewire, with the left hand the delivery microcatheter was retrieved unsheathing the retrieval device.  The 6 [place] Catalyst guide catheter was then advanced to oppose and engage the proximal portion of the clot.  With proximal flow arrest in the right internal carotid artery by inflating the balloon of the FlowGate guide catheter, a combination of the retrieval device, the microcatheter, and the 6 [place] Catalyst guide catheter was then retrieved proximally as constant aspiration was applied with a 60 mL syringe at the hub of the [facility] guide catheter, and a Penumbra aspiration device at the hub of the 6 [place] Catalyst guide catheter. The combination was retrieved and removed. Flow arrest was reversed in the right internal carotid artery.  A control arteriogram performed through the [facility] guide catheter in the right internal carotid artery demonstrated now angiographic complete revascularization of the right middle cerebral M1 segment, and also a very dominant inferior division with multiple branches. A TICI 2b revascularization had been achieved.  There continued to be lack of  visibility of the previously seen diminutive superior division branches.  The combination of the Trevo ProVue microcatheter, the 6 [place] Catalyst guide catheter over a 0.014 inch Softip Synchro micro guidewire was then advanced to the right middle cerebral artery M1 segment.  The micro guidewire was then exchanged for a 016 Fathom micro guidewire.  The micro guidewire was then gently manipulated with a torque device to advance into the diminutive superior division into the M2 region.  This was then followed by the microcatheter. The guidewire was removed. There was good aspiration at the hub of the microcatheter. Gentle control arteriogram performed demonstrated slow antegrade flow into these diminutive branches.  A 5 mm x 33 mm Embotrap retrieval device was again advanced and deployed such that it covered the M2 region of superior division of the right middle cerebral artery.  Again with constant aspiration through the Catalyst guide catheter at the origin of the superior division, and proximal flow arrest, the combination of the retrieval device, and the microcatheter in the 6 [place] Catalyst guide catheter were retrieved and removed as constant aspiration was applied at the hub of the [facility] guide catheter with a 60 mL syringe and Penumbra aspiration catheter at the hub of the 6 [place] Catalyst guide catheter.  Following reversal of flow, flow arrest a control arteriogram performed through the [facility] guide catheter demonstrated minimal opacification of the superior division of the right middle cerebral artery.  Patient was given 25 mcg of nitroglycerin intra-arterially x3 in order to relieve the vasospasm with mild benefit.  No further attempt was made to target the diminutive superior division branches as the patient had already received IV tPA.  A final control arteriogram performed through the 8 [place] FlowGate guide catheter in the right common carotid artery demonstrated wide patency of the  right internal carotid artery extra cranially and intracranially.  There continued to be patency of the right middle cerebral artery branches and the right anterior cerebral artery branch into  the capillary and venous phases. The area supplied by the diminutive branches of the superior division appeared to be gradually recanalizing. The right posterior communicating artery with opacification of both posterior cerebral arteries continued to be present.  The left common carotid arteriogram demonstrated the left external carotid artery and its major branches to be widely patent.  The left internal carotid artery at the bulb to the cranial skull base also demonstrates wide patency.  The petrous, cavernous and supraclinoid segments demonstrate wide patency as well.  Cross-filling via the anterior communicating artery of the right anterior cerebral A2 segment and distally was noted transiently.  During the procedure, no evidence of extravasation or of mass effect or midline shift was noted.  The 8 [place] FlowGate guide catheter was then retrieved into the abdominal aorta as was the 55 cm Brite tip neurovascular sheath. These were then exchanged over a J-tip guidewire for an 8 [place] Pinnacle sheath. An 8 French Pinnacle sheath was converted to a 7 [place] Pinnacle sheath in order to maintain antegrade flow into the right common femoral artery trifurcation regions.  At the end of the procedure, a flat panel CT of the brain demonstrated no evidence of gross mass effect, midline shift or of intracranial hemorrhage.  Distal pulses remained Dopplerable in the dorsalis pedis and the posterior tibial regions bilaterally in both feet at the end of the procedure unchanged from prior to the procedure.  The patient was then transported to the neuro ICU for post thrombectomy care.  IMPRESSION: Status post endovascular revascularization of occluded right middle cerebral artery distal M1 segment, achieving TICI 2b  revascularization, with 2 passes with the 5 mm x 33 mm Embotrap retrieval device.  PLAN: Follow-up in the clinic 4-6 weeks post discharge.   Electronically Signed   By: Luanne Bras M.D.   On: 10/22/2018 15:40    Assessment/Plan  1. Long term current use of anticoagulant - INR 1.5, subtherapeutic, INR goal 2-3, will give Coumadin 5 mg X 2 = 10 mg PO X 1 today then 8 mg daily starting 11/02/18  2. Cerebrovascular accident (CVA) due to embolism of right middle cerebral artery (HCC) - S/P tpA and thrombectomy on 5/25, stable, continue Coumadin,  Carvedilol 3.125 mg BID, Atorvastatin 40 mg daily    Family/ staff Communication: Discussed plan of care with resident and charge nurse.  Labs/tests ordered:  INR on 11/04/18  Goals of care:   Short-term care   Durenda Age, DNP, FNP-BC [facility] and Adult Medicine [phone] (Monday-Friday 8:00 a.m. - 5:00 p.m.) [phone] (after hours)

## 2018-11-04 ENCOUNTER — Telehealth: Payer: Self-pay | Admitting: Pharmacist

## 2018-11-04 NOTE — Telephone Encounter (Signed)
Jared Stevens, [place], PA  P Cv Div [address] Scheduling; P Cv Div Ch [facility] Anticoag        Patient will need a coumadin clinic visit this week and 2-3 weeks followup per Dr. Acie Fredrickson who saw him in the hospital and will be his primary cardiologist. Looks like virtual visit with laura is already set up. Just need to coumadin clinic visit.    Thanks  [employer]

## 2018-11-04 NOTE — Telephone Encounter (Addendum)
Called pt who mentioned he is at [place] called over to [facility] and was advised that they are monitoring and dosing patient's warfarin while he is at [facility]. Once he is discharged, will transfer care to our Coumadin clinic.

## 2018-11-05 ENCOUNTER — Non-Acute Institutional Stay (SKILLED_NURSING_FACILITY): Payer: Medicare HMO | Admitting: Adult Health

## 2018-11-05 ENCOUNTER — Telehealth: Payer: Self-pay | Admitting: Internal Medicine

## 2018-11-05 ENCOUNTER — Encounter: Payer: Self-pay | Admitting: Adult Health

## 2018-11-05 DIAGNOSIS — I63411 Cerebral infarction due to embolism of right middle cerebral artery: Secondary | ICD-10-CM

## 2018-11-05 DIAGNOSIS — Z7901 Long term (current) use of anticoagulants: Secondary | ICD-10-CM | POA: Diagnosis not present

## 2018-11-05 NOTE — Progress Notes (Signed)
Location:  [place] Room Number: 310-B Place of Service:  SNF (31) Provider:  Durenda Age, DNP, FNP-BC  Patient Care Team: Patient, No Pcp Per as PCP - General (General Practice) Nahser, Wonda Cheng, MD as PCP - Cardiology (Cardiology)  Extended Emergency Contact Information Primary Emergency Contact: Majestic, Brister Mobile Phone: [phone] Relation: Daughter Secondary Emergency Contact: [place] Mobile Phone: [phone] Relation: Daughter  Code Status:  Full Code  Goals of care: Advanced Directive information Advanced Directives 10/30/2018  Does Patient Have a Medical Advance Directive? Yes  Type of Advance Directive (No Data)  Does patient want to make changes to medical advance directive? No - Patient declined  Would patient like information on creating a medical advance directive? No - Patient declined     Chief Complaint  Patient presents with  . Acute Visit    Coumadin therapy    HPI:  Pt is a 68 y.o. male seen today for Coumadin therapy.  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  The INR goal is 2-3. Yesterday, 6/08, his INR was 3.3 and Coumadin was held. Today's INR 3.4. No noted bleeding nor bruising.   Past Medical History:  Diagnosis Date  . Combined congestive systolic and diastolic heart failure ([place])   . COPD (chronic obstructive pulmonary disease) ([place])   . Hypercholesteremia   . Lung cancer ([place]) 03/2016   Past Surgical History:  Procedure Laterality Date  . Arm Surgery    . HERNIA REPAIR    . IR ANGIO INTRA EXTRACRAN SEL COM CAROTID INNOMINATE UNI L MOD SED  10/21/2018  . IR ANGIO VERTEBRAL SEL SUBCLAVIAN INNOMINATE UNI R MOD SED  10/21/2018  . IR CT HEAD LTD  10/21/2018  . IR PERCUTANEOUS ART THROMBECTOMY/INFUSION INTRACRANIAL INC DIAG ANGIO  10/21/2018  . IR THORACENTESIS ASP PLEURAL SPACE W/IMG GUIDE  09/27/2018  . RADIOLOGY WITH ANESTHESIA N/A 10/21/2018    Procedure: RADIOLOGY WITH ANESTHESIA;  Surgeon: Luanne Bras, MD;  Location: [place];  Service: Radiology;  Laterality: N/A;  . REPLANTATION THUMB    . RIGHT/LEFT HEART CATH AND CORONARY ANGIOGRAPHY N/A 10/01/2018   Procedure: RIGHT/LEFT HEART CATH AND CORONARY ANGIOGRAPHY;  Surgeon: Troy Sine, MD;  Location: [place] [facility];  Service: Cardiovascular;  Laterality: N/A;    No Known Allergies  Outpatient Encounter Medications as of 11/05/2018  Medication Sig  . amitriptyline (ELAVIL) 50 MG tablet Take 50 mg by mouth at bedtime.  Marland Kitchen atorvastatin (LIPITOR) 40 MG tablet Place 1 tablet (40 mg total) into feeding tube daily at 6 PM.  . carvedilol (COREG) 3.125 MG tablet Take 1 tablet (3.125 mg total) by mouth 2 (two) times daily with a meal.  . diazepam (VALIUM) 5 MG tablet Take 1 tablet (5 mg total) by mouth every 4 (four) hours as needed. Back pain  . furosemide (LASIX) 40 MG tablet Take 1 tablet (40 mg total) by mouth daily.  Marland Kitchen ipratropium (ATROVENT HFA) 17 MCG/ACT inhaler Administer two puffs into the lungs every six hours as needed for cough, congestion  . latanoprost (XALATAN) 0.005 % ophthalmic solution Place 1 drop into both eyes 2 (two) times daily.   . mometasone (ASMANEX) 220 MCG/INH inhaler Administer two puffs into the lungs twice a day for COPD Rinse after use. Shake inhaler before use  . spironolactone (ALDACTONE) 25 MG tablet Take 0.5 tablets (12.5 mg total) by mouth daily.  . traMADol (ULTRAM) 50 MG tablet Take 1  tablet (50 mg total) by mouth every 8 (eight) hours as needed.  . warfarin (COUMADIN) 7.5 MG tablet Take 1 tablet (7.5 mg total) by mouth daily.   No facility-administered encounter medications on file as of 11/05/2018.     Review of Systems  GENERAL: No change in appetite, no fatigue, no weight changes, no fever, chills or weakness MOUTH and THROAT: Denies oral discomfort, gingival pain or bleeding, pain from teeth or hoarseness   RESPIRATORY: no cough, SOB,  DOE, wheezing, hemoptysis CARDIAC: No chest pain, edema or palpitations GI: No abdominal pain, diarrhea, constipation, heart burn, nausea or vomiting GU: Denies dysuria, frequency, hematuria, incontinence, or discharge NEUROLOGICAL: Denies dizziness, syncope, numbness, or headache PSYCHIATRIC: Denies feelings of depression or anxiety. No report of hallucinations, insomnia, paranoia, or agitation    There is no immunization history on file for this patient. Pertinent  Health Maintenance Due  Topic Date Due  . COLONOSCOPY  11/29/2018 (Originally 04/02/2001)  . PNA vac Low Risk Adult (1 of 2 - PCV13) 11/29/2018 (Originally 04/02/2016)  . INFLUENZA VACCINE  12/28/2018   Fall Risk  05/06/2018  Falls in the past year? 1  Comment tripped on the curb  Number falls in past yr: 0  Injury with Fall? 1  Comment broke his collar bone, hands and chest now numb     Vitals:   11/05/18 1140  BP: 96/68  Pulse: 80  Resp: 18  Temp: 97.9 F (36.6 C)  TempSrc: Oral  Weight: 133 lb (60.3 kg)  Height: 6\' 1"  (1.854 m)   Body mass index is 17.55 kg/m.  Physical Exam  GENERAL APPEARANCE:  In no acute distress.  SKIN:  Skin is warm and dry.  MOUTH and THROAT: Lips are without lesions. Oral mucosa is moist and without lesions. Tongue is normal in shape, size, and color and without lesions RESPIRATORY: Breathing is even & unlabored, BS CTAB CARDIAC: RRR, no murmur,no extra heart sounds, no edema GI: Abdomen soft, normal BS, no masses, no tenderness EXTREMITIES:  Able to move X 4 extremities NEUROLOGICAL: There is no tremor. Speech is clear. Alert to self and place, disoriented to time. PSYCHIATRIC:  Affect and behavior are appropriate  Labs reviewed: Recent Labs    10/22/18 0425  10/24/18 0539  10/27/18 0446 10/27/18 1645 10/28/18 0449 10/29/18 0500  NA 143   < > 143   < > 138  --  137 141  K 3.6   < > 3.1*   < > 3.7  --  4.1 4.1  CL 108   < > 108   < > 100  --  102 104  CO2 22   < > 27    < > 28  --  27 30  GLUCOSE 92   < > 95   < > 90  --  88 87  BUN 12   < > 19   < > 20  --  19 17  CREATININE 0.97   < > 0.93   < > 0.85  --  0.81 0.91  CALCIUM 9.0   < > 8.1*   < > 8.8*  --  8.8* 8.8*  MG 1.7   < > 1.8  --   --  2.0 1.9  --   PHOS 3.2  --  2.8  --   --   --   --   --    < > = values in this interval not displayed.   Recent Labs  09/27/18 0102 09/29/18 0449 10/21/18 1729  AST 62* 31 23  ALT 51* 35 25  ALKPHOS 79 73 73  BILITOT 0.6 0.6 0.6  PROT 6.2* 5.6* 6.5  ALBUMIN 3.3* 2.7* 3.1*   Recent Labs    09/27/18 0102  10/21/18 1729  10/22/18 0425  10/27/18 0446 10/28/18 0449 10/29/18 0500  WBC 7.2   < > 6.1  --  9.1   < > 6.2 5.8 8.2  NEUTROABS 4.9  --  3.8  --  7.0  --   --   --   --   HGB 14.0   < > 14.7   < > 14.5   < > 14.6 14.4 13.3  HCT 43.0   < > 44.5   < > 42.4   < > 43.0 43.3 40.7  MCV 89.4   < > 88.5  --  86.2   < > 86.5 87.3 88.3  PLT 320   < > 333  --  352   < > 314 341 335   < > = values in this interval not displayed.   Lab Results  Component Value Date   TSH 0.789 09/28/2018   Lab Results  Component Value Date   HGBA1C 5.7 (H) 10/22/2018   Lab Results  Component Value Date   CHOL 162 10/22/2018   HDL 51 10/22/2018   LDLCALC 94 10/22/2018   TRIG 118 10/23/2018   CHOLHDL 3.2 10/22/2018    Significant Diagnostic Results in last 30 days:  Ct Code Stroke Cta Head W/wo Contrast  Result Date: 10/21/2018 CLINICAL DATA:  Stroke.  Left-sided weakness. EXAM: CT ANGIOGRAPHY HEAD AND NECK CT PERFUSION BRAIN TECHNIQUE: Multidetector CT imaging of the head and neck was performed using the standard protocol during bolus administration of intravenous contrast. Multiplanar CT image reconstructions and MIPs were obtained to evaluate the vascular anatomy. Carotid stenosis measurements (when applicable) are obtained utilizing NASCET criteria, using the distal internal carotid diameter as the denominator. Multiphase CT imaging of the brain was performed  following IV bolus contrast injection. Subsequent parametric perfusion maps were calculated using RAPID software. CONTRAST:  153mL OMNIPAQUE IOHEXOL 350 MG/ML SOLN COMPARISON:  CT head 10/21/2018 FINDINGS: CTA NECK FINDINGS Aortic arch: Standard branching. Proximal great vessels widely patent. Decreased attenuation descending thoracic aorta likely due to beam attenuation from a large right pleural effusion. Ascending aorta normal in caliber and opacification. Right carotid system: Right carotid system widely patent without stenosis. Mild atherosclerotic calcification right internal carotid artery below the skull base. Carotid bifurcation normal Left carotid system: Left carotid bifurcation widely patent without stenosis. No significant atherosclerotic disease. Vertebral arteries: Both vertebral arteries widely patent to the basilar without stenosis. Skeleton: Cervical spine spondylosis.  No acute bony abnormality Other neck: Negative for mass or adenopathy in the neck. Upper chest: Large right pleural effusion. Patient has a history of lung cancer. Review of the MIP images confirms the above findings CTA HEAD FINDINGS Anterior circulation: Cavernous carotid widely patent bilaterally with mild atherosclerotic calcification. Right M1 segment patent proximally. There is thrombus in the distal right M1 segment. There is occlusive thrombus in right M2 branches with poor collateral flow. Minimal opacification of right middle cerebral artery branches. Azygos anterior cerebral artery which supplies both pericallosal arteries without stenosis. Left middle cerebral artery widely patent. Posterior circulation: Both vertebral arteries patent to the basilar. PICA patent bilaterally. Basilar widely patent. Superior cerebellar and posterior cerebral arteries patent bilaterally. Fetal origin right posterior cerebral artery. Venous sinuses: Minimal  venous contrast due to arterial phase scanning Anatomic variants: None Delayed phase:  Not performed Review of the MIP images confirms the above findings CT Brain Perfusion Findings: ASPECTS: 10 CBF (<30%) Volume: 27mL Perfusion (Tmax>6.0s) volume: 235mL Mismatch Volume: 135mL Infarction Location:Right MCA territory involving the frontotemporal and parietal lobe. IMPRESSION: 1. Acute thrombus distal right M1 segment extending into the M2 branches bilaterally. This is occlusive with minimal collateral flow and poor opacification of right MCA branches. 2. No other significant intracranial stenosis or occlusion 3. Carotid bifurcation widely patent bilaterally. Both vertebral arteries patent. 4. 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 5. These results were called by telephone at the time of interpretation on 10/21/2018 at 6:17 pm to Dr. Kerney Elbe , who verbally acknowledged these results. Electronically Signed   By: Franchot Gallo M.D.   On: 10/21/2018 18:19   Ct Head Wo Contrast  Result Date: 10/22/2018 CLINICAL DATA:  Follow-up stroke EXAM: CT HEAD WITHOUT CONTRAST TECHNIQUE: Contiguous axial images were obtained from the base of the skull through the vertex without intravenous contrast. COMPARISON:  10/21/2018 FINDINGS: Brain: No evidence of acute infarction, hemorrhage, hydrocephalus, extra-axial collection or mass lesion/mass effect. Periventricular white matter hypodensity. Vascular: No hyperdense vessel or unexpected calcification. Skull: Normal. Negative for fracture or focal lesion. Sinuses/Orbits: No acute finding. Other: None. IMPRESSION: No acute intracranial pathology. No CT evidence of acute stroke or hemorrhage. Small-vessel white matter disease. Electronically Signed   By: Eddie Candle M.D.   On: 10/22/2018 16:08   Ct Code Stroke Cta Neck W/wo Contrast  Result Date: 10/21/2018 CLINICAL DATA:  Stroke.  Left-sided weakness. EXAM: CT ANGIOGRAPHY HEAD AND NECK CT PERFUSION BRAIN TECHNIQUE: Multidetector CT imaging of the head and neck was  performed using the standard protocol during bolus administration of intravenous contrast. Multiplanar CT image reconstructions and MIPs were obtained to evaluate the vascular anatomy. Carotid stenosis measurements (when applicable) are obtained utilizing NASCET criteria, using the distal internal carotid diameter as the denominator. Multiphase CT imaging of the brain was performed following IV bolus contrast injection. Subsequent parametric perfusion maps were calculated using RAPID software. CONTRAST:  156mL OMNIPAQUE IOHEXOL 350 MG/ML SOLN COMPARISON:  CT head 10/21/2018 FINDINGS: CTA NECK FINDINGS Aortic arch: Standard branching. Proximal great vessels widely patent. Decreased attenuation descending thoracic aorta likely due to beam attenuation from a large right pleural effusion. Ascending aorta normal in caliber and opacification. Right carotid system: Right carotid system widely patent without stenosis. Mild atherosclerotic calcification right internal carotid artery below the skull base. Carotid bifurcation normal Left carotid system: Left carotid bifurcation widely patent without stenosis. No significant atherosclerotic disease. Vertebral arteries: Both vertebral arteries widely patent to the basilar without stenosis. Skeleton: Cervical spine spondylosis.  No acute bony abnormality Other neck: Negative for mass or adenopathy in the neck. Upper chest: Large right pleural effusion. Patient has a history of lung cancer. Review of the MIP images confirms the above findings CTA HEAD FINDINGS Anterior circulation: Cavernous carotid widely patent bilaterally with mild atherosclerotic calcification. Right M1 segment patent proximally. There is thrombus in the distal right M1 segment. There is occlusive thrombus in right M2 branches with poor collateral flow. Minimal opacification of right middle cerebral artery branches. Azygos anterior cerebral artery which supplies both pericallosal arteries without stenosis.  Left middle cerebral artery widely patent. Posterior circulation: Both vertebral arteries patent to the basilar. PICA patent bilaterally. Basilar widely patent. Superior cerebellar and posterior cerebral arteries patent bilaterally.  Fetal origin right posterior cerebral artery. Venous sinuses: Minimal venous contrast due to arterial phase scanning Anatomic variants: None Delayed phase: Not performed Review of the MIP images confirms the above findings CT Brain Perfusion Findings: ASPECTS: 10 CBF (<30%) Volume: 49mL Perfusion (Tmax>6.0s) volume: 235mL Mismatch Volume: 165mL Infarction Location:Right MCA territory involving the frontotemporal and parietal lobe. IMPRESSION: 1. Acute thrombus distal right M1 segment extending into the M2 branches bilaterally. This is occlusive with minimal collateral flow and poor opacification of right MCA branches. 2. No other significant intracranial stenosis or occlusion 3. Carotid bifurcation widely patent bilaterally. Both vertebral arteries patent. 4. 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 5. These results were called by telephone at the time of interpretation on 10/21/2018 at 6:17 pm to Dr. Kerney Elbe , who verbally acknowledged these results. Electronically Signed   By: Franchot Gallo M.D.   On: 10/21/2018 18:19   Mr Brain Wo Contrast  Result Date: 10/23/2018 CLINICAL DATA:  Stroke follow-up.  Left-sided weakness EXAM: MRI HEAD WITHOUT CONTRAST TECHNIQUE: Multiplanar, multiecho pulse sequences of the brain and surrounding structures were obtained without intravenous contrast. COMPARISON:  Head CT from yesterday FINDINGS: Brain: Acute infarction in the superficial right temporal lobe, right insula, basal ganglia, and lateral frontal lobe. No acute infarcts seen in a separate distribution. No hemorrhagic conversion or significant mass effect. Mild or moderate chronic microvascular ischemic change seen in the cerebral white  matter. Anterior right frontal lobe the encephalomalacia that may be posttraumatic given location and orbital findings. Vascular: Major flow voids are preserved. Skull and upper cervical spine: Negative for marrow lesion. Sinuses/Orbits: Chronic sinusitis with mild mucosal thickening. Remote medial orbital blowout fracture on the right. Retention cysts in the right maxillary sinus. IMPRESSION: 1. 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 2. Anterior right frontal lobe encephalomalacia, potentially posttraumatic given old facial fractures. Electronically Signed   By: Monte Fantasia M.D.   On: 10/23/2018 06:38   [place]  Result Date: 10/23/2018 INDICATION: Left-sided weakness with right gaze preference and dysarthria. 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 EXAM: 1. EMERGENT LARGE VESSEL OCCLUSION THROMBOLYSIS anterior CIRCULATION) COMPARISON:  CT angiogram of the head and neck of 10/21/2015. MEDICATIONS: 2 g Ancef IV antibiotic was administered within 1 hour of the procedure. ANESTHESIA/SEDATION: General anesthesia. CONTRAST:  Isovue 300 approximately 100 mL. FLUOROSCOPY TIME:  Fluoroscopy Time: 50 minutes 46 seconds (3279 mGy). COMPLICATIONS: None immediate. TECHNIQUE: Following a full explanation of the procedure along with the potential associated complications, an informed witnessed consent was obtained from the patient's sister and daughter. The risks of intracranial hemorrhage of 10%, worsening neurological deficit, ventilator dependency, death and inability to revascularize were all reviewed in detail with the patient's sister and daughter. The patient was then put under general anesthesia by the Department of Anesthesiology at [facility]. The right groin was prepped and draped in the usual sterile fashion. Thereafter using modified Seldinger technique, transfemoral access into the right common femoral artery was obtained  without difficulty. Over a 0.035 inch guidewire a 5 French Pinnacle sheath was inserted. Through this, and also over a 0.035 inch guidewire a 5 [place] JB 1 catheter was advanced to the aortic arch region and selectively positioned in the innominate artery artery and the right common carotid artery and the left common carotid artery. FINDINGS: Innominate artery angiogram demonstrates the right subclavian artery and the right common  carotid artery origins to be widely patent. The right vertebral artery is patent. The vessel is seen to opacify to the cranial skull base. Opacification is seen of the right vertebrobasilar junction and the right posterior-inferior cerebellar artery. The opacified portion of the basilar artery, the posterior cerebral arteries and the superior cerebellar arteries is grossly intact on the lateral DSA images. Unopacified blood is seen in the basilar artery from the contralateral vertebral artery. The right common carotid arteriogram demonstrates wide patency of the right external carotid artery and its major branches. The right internal carotid artery at the bulb to the cranial skull base demonstrates wide patency. The petrous, cavernous and supraclinoid segments are widely patent. A right posterior communicating artery is seen opacifying the right posterior cerebral distribution. The right anterior cerebral artery opacifies into the capillary and venous phases. The right middle cerebral artery opacifies to the proximal right M1 segment with diminutive branches of the right MCA trifurcation. PROCEDURE: The diagnostic JB 1 catheter in right common carotid artery was then exchanged over a 0.035 inch 300 cm Rosen exchange guidewire for a 55 cm 8 French Brite tip neurovascular sheath. Good aspiration was obtained from the side port of the neurovascular sheath. This was then connected to continuous heparinized saline infusion. Over the [facility] exchange guidewire, a 95 cm 8 [place] FlowGate guide  catheter which had been prepped with 50% contrast and 50% heparinized saline infusion was advanced and positioned in the distal right internal carotid artery. The guidewire was removed. Good aspiration obtained from the hub of the [facility] guide catheter. A gentle contrast injection demonstrated no evidence of spasms, dissections or of intraluminal filling defects. Over a 0.014 inch Softip Synchro micro guidewire, a combination of a 132 cm 6 [place] Catalyst guide catheter inside of which was an 021Trevo ProVue microcatheter was advanced to the supraclinoid right ICA. The micro guidewire was then gently manipulated using a torque device into the M2 M3 region of the inferior division. The guidewire was removed. Good aspiration obtained from the hub of the microcatheter. A gentle contrast injection demonstrates safe position of tip of the microcatheter. This was then connected to continuous heparinized saline infusion. A 5 mm x 33 mm Embotrap retrieval device was then advanced to the distal end of the microcatheter. The Catalyst guide catheter was advanced into the mid M1 segment of the right middle cerebral artery. The O ring on the delivery microcatheter was then loosened. With slight forward gentle traction with the right hand on the delivery micro guidewire, with the left hand the delivery microcatheter was retrieved unsheathing the retrieval device. The 6 [place] Catalyst guide catheter was then advanced to oppose and engage the proximal portion of the clot. With proximal flow arrest in the right internal carotid artery by inflating the balloon of the FlowGate guide catheter, a combination of the retrieval device, the microcatheter, and the 6 [place] Catalyst guide catheter was then retrieved proximally as constant aspiration was applied with a 60 mL syringe at the hub of the [facility] guide catheter, and a Penumbra aspiration device at the hub of the 6 [place] Catalyst guide catheter. The combination was retrieved  and removed. Flow arrest was reversed in the right internal carotid artery. A control arteriogram performed through the [facility] guide catheter in the right internal carotid artery demonstrated now angiographic complete revascularization of the right middle cerebral M1 segment, and also a very dominant inferior division with multiple branches. A TICI 2b revascularization had been achieved. There continued to be  lack of visibility of the previously seen diminutive superior division branches. The combination of the Trevo ProVue microcatheter, the 6 [place] Catalyst guide catheter over a 0.014 inch Softip Synchro micro guidewire was then advanced to the right middle cerebral artery M1 segment. The micro guidewire was then exchanged for a 016 Fathom micro guidewire. The micro guidewire was then gently manipulated with a torque device to advance into the diminutive superior division into the M2 region. This was then followed by the microcatheter. The guidewire was removed. There was good aspiration at the hub of the microcatheter. Gentle control arteriogram performed demonstrated slow antegrade flow into these diminutive branches. A 5 mm x 33 mm Embotrap retrieval device was again advanced and deployed such that it covered the M2 region of superior division of the right middle cerebral artery. Again with constant aspiration through the Catalyst guide catheter at the origin of the superior division, and proximal flow arrest, the combination of the retrieval device, and the microcatheter in the 6 [place] Catalyst guide catheter were retrieved and removed as constant aspiration was applied at the hub of the [facility] guide catheter with a 60 mL syringe and Penumbra aspiration catheter at the hub of the 6 [place] Catalyst guide catheter. Following reversal of flow, flow arrest a control arteriogram performed through the [facility] guide catheter demonstrated minimal opacification of the superior division of the right middle  cerebral artery. Patient was given 25 mcg of nitroglycerin intra-arterially x3 in order to relieve the vasospasm with mild benefit. No further attempt was made to target the diminutive superior division branches as the patient had already received IV tPA. A final control arteriogram performed through the 8 [place] FlowGate guide catheter in the right common carotid artery demonstrated wide patency of the right internal carotid artery extra cranially and intracranially. There continued to be patency of the right middle cerebral artery branches and the right anterior cerebral artery branch into the capillary and venous phases. The area supplied by the diminutive branches of the superior division appeared to be gradually recanalizing. The right posterior communicating artery with opacification of both posterior cerebral arteries continued to be present. The left common carotid arteriogram demonstrated the left external carotid artery and its major branches to be widely patent. The left internal carotid artery at the bulb to the cranial skull base also demonstrates wide patency. The petrous, cavernous and supraclinoid segments demonstrate wide patency as well. Cross-filling via the anterior communicating artery of the right anterior cerebral A2 segment and distally was noted transiently. During the procedure, no evidence of extravasation or of mass effect or midline shift was noted. The 8 [place] FlowGate guide catheter was then retrieved into the abdominal aorta as was the 55 cm Brite tip neurovascular sheath. These were then exchanged over a J-tip guidewire for an 8 [place] Pinnacle sheath. An 8 French Pinnacle sheath was converted to a 7 [place] Pinnacle sheath in order to maintain antegrade flow into the right common femoral artery trifurcation regions. At the end of the procedure, a flat panel CT of the brain demonstrated no evidence of gross mass effect, midline shift or of intracranial hemorrhage. Distal pulses  remained Dopplerable in the dorsalis pedis and the posterior tibial regions bilaterally in both feet at the end of the procedure unchanged from prior to the procedure. The patient was then transported to the neuro ICU for post thrombectomy care. IMPRESSION: Status post endovascular revascularization of occluded right middle cerebral artery distal M1 segment, achieving TICI 2b revascularization, with 2  passes with the 5 mm x 33 mm Embotrap retrieval device. PLAN: Follow-up in the clinic 4-6 weeks post discharge. Electronically Signed   By: Luanne Bras M.D.   On: 10/22/2018 15:40   Ct Code Stroke Cta Cerebral Perfusion W/wo Contrast  Result Date: 10/21/2018 CLINICAL DATA:  Stroke.  Left-sided weakness. EXAM: CT ANGIOGRAPHY HEAD AND NECK CT PERFUSION BRAIN TECHNIQUE: Multidetector CT imaging of the head and neck was performed using the standard protocol during bolus administration of intravenous contrast. Multiplanar CT image reconstructions and MIPs were obtained to evaluate the vascular anatomy. Carotid stenosis measurements (when applicable) are obtained utilizing NASCET criteria, using the distal internal carotid diameter as the denominator. Multiphase CT imaging of the brain was performed following IV bolus contrast injection. Subsequent parametric perfusion maps were calculated using RAPID software. CONTRAST:  119mL OMNIPAQUE IOHEXOL 350 MG/ML SOLN COMPARISON:  CT head 10/21/2018 FINDINGS: CTA NECK FINDINGS Aortic arch: Standard branching. Proximal great vessels widely patent. Decreased attenuation descending thoracic aorta likely due to beam attenuation from a large right pleural effusion. Ascending aorta normal in caliber and opacification. Right carotid system: Right carotid system widely patent without stenosis. Mild atherosclerotic calcification right internal carotid artery below the skull base. Carotid bifurcation normal Left carotid system: Left carotid bifurcation widely patent without  stenosis. No significant atherosclerotic disease. Vertebral arteries: Both vertebral arteries widely patent to the basilar without stenosis. Skeleton: Cervical spine spondylosis.  No acute bony abnormality Other neck: Negative for mass or adenopathy in the neck. Upper chest: Large right pleural effusion. Patient has a history of lung cancer. Review of the MIP images confirms the above findings CTA HEAD FINDINGS Anterior circulation: Cavernous carotid widely patent bilaterally with mild atherosclerotic calcification. Right M1 segment patent proximally. There is thrombus in the distal right M1 segment. There is occlusive thrombus in right M2 branches with poor collateral flow. Minimal opacification of right middle cerebral artery branches. Azygos anterior cerebral artery which supplies both pericallosal arteries without stenosis. Left middle cerebral artery widely patent. Posterior circulation: Both vertebral arteries patent to the basilar. PICA patent bilaterally. Basilar widely patent. Superior cerebellar and posterior cerebral arteries patent bilaterally. Fetal origin right posterior cerebral artery. Venous sinuses: Minimal venous contrast due to arterial phase scanning Anatomic variants: None Delayed phase: Not performed Review of the MIP images confirms the above findings CT Brain Perfusion Findings: ASPECTS: 10 CBF (<30%) Volume: 69mL Perfusion (Tmax>6.0s) volume: 250mL Mismatch Volume: 144mL Infarction Location:Right MCA territory involving the frontotemporal and parietal lobe. IMPRESSION: 1. Acute thrombus distal right M1 segment extending into the M2 branches bilaterally. This is occlusive with minimal collateral flow and poor opacification of right MCA branches. 2. No other significant intracranial stenosis or occlusion 3. Carotid bifurcation widely patent bilaterally. Both vertebral arteries patent. 4. 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 5. These results were  called by telephone at the time of interpretation on 10/21/2018 at 6:17 pm to Dr. Kerney Elbe , who verbally acknowledged these results. Electronically Signed   By: Franchot Gallo M.D.   On: 10/21/2018 18:19   Dg Chest Port 1 View  Result Date: 10/24/2018 CLINICAL DATA:  68 year old male with a history of respiratory failure EXAM: PORTABLE CHEST 1 VIEW COMPARISON:  10/23/2018 FINDINGS: Cardiomediastinal silhouette unchanged in size and contour. Right heart border partially obscured by overlying lung/pleural disease. Increasing opacity at the right lung base with obscuration the right hemidiaphragm. Left lung relatively well aerated. Interval removal of the endotracheal  tube. Interval removal of gastric tube. Unchanged left upper extremity PICC. IMPRESSION: Increasing opacity at the right lung base likely a combination of enlarging pleural fluid and associated atelectasis/consolidation. Interval removal of endotracheal tube and gastric tube. Unchanged PICC Electronically Signed   By: Corrie Mckusick D.O.   On: 10/24/2018 07:47   Dg Chest Port 1 View  Result Date: 10/23/2018 CLINICAL DATA:  Acute respiratory failure EXAM: PORTABLE CHEST 1 VIEW COMPARISON:  10/22/2018 FINDINGS: Support devices are stable. Moderate/large right pleural effusion and right base atelectasis, stable. Cardiomegaly. Left lung clear. IMPRESSION: Stable right effusion with right lower lobe atelectasis. Cardiomegaly. Electronically Signed   By: Rolm Baptise M.D.   On: 10/23/2018 08:18   Portable Chest Xray  Result Date: 10/22/2018 CLINICAL DATA:  COPD, lung cancer, pleural effusion EXAM: PORTABLE CHEST 1 VIEW COMPARISON:  10/21/2018 FINDINGS: Endotracheal tube and NG tube are in place. Moderate to large right pleural effusion is stable since prior study. Right lower lobe atelectasis or infiltrate is stable. Left lung clear. No acute bony abnormality. IMPRESSION: Moderate to large-sized right pleural effusion with right lower lobe  atelectasis or infiltrate, stable. Electronically Signed   By: Rolm Baptise M.D.   On: 10/22/2018 08:10   Dg Chest Portable 1 View  Result Date: 10/21/2018 CLINICAL DATA:  Intubation EXAM: PORTABLE CHEST 1 VIEW COMPARISON:  09/27/2018 FINDINGS: The endotracheal tube terminates above the carina by approximately 6 cm. There is a multiloculated right-sided pleural effusion which has increased in size from prior study. No evidence of a right-sided pneumothorax. The left lung field is mostly clear. IMPRESSION: 1. Endotracheal tube as above. 2. Interval increase in size of the right-sided pleural effusion. Electronically Signed   By: Constance Holster M.D.   On: 10/21/2018 18:55   Dg Abd Portable 1v  Result Date: 10/21/2018 CLINICAL DATA:  Orogastric tube placement EXAM: PORTABLE ABDOMEN - 1 VIEW COMPARISON:  None. FINDINGS: Orogastric tube tip and side port are in the stomach. There is moderate stool in the colon. There are loops of mildly dilated colon which likely is indicative of a degree of ileus. Bowel obstruction not felt to be likely. No free air. There is a right pleural effusion with consolidation in the right lung base. IMPRESSION: Orogastric tube tip and side port in stomach. Suspect a degree of ileus. No free air. Right pleural effusion and right base consolidation noted. Electronically Signed   By: Lowella Grip III M.D.   On: 10/21/2018 23:25   Mr Cardiac Morphology W Wo Contrast  Result Date: 10/28/2018 CLINICAL DATA:  Cardiomyopathy, Stroke EXAM: CARDIAC MRI TECHNIQUE: The patient was scanned on a 1.5 Tesla Siemens magnet. A dedicated cardiac coil was used. Functional imaging was done using Fiesta sequences. 2,3, and 4 chamber views were done to assess for RWMA's. Modified Simpson's rule using a short axis stack was used to calculate an ejection fraction on a dedicated work Conservation officer, nature. The patient received Gadavist after 10 minutes inversion recovery sequences were used  to assess for infiltration and scar tissue. CONTRAST:  Gadavist FINDINGS: There was mild LAE. Normal RA. No ASD/VSD. Dilated IVC. Normal aortic root. Normal appearing AV and MV. Mild appearing MR. Small pericardial effusion. Large bilateral pleural effusions. RV is mildly dilated and hypokinetic. The RVEF was 22% (EDV 165cc, ESV 129 cc SV 36 cc) The LV was severely dilated. There was diffuse hypokinesis worse in the inferior wall with marked anterior and inferior wall dyssynchrony The quantitative LVEF was 11% (EDV  234 cc ESV 209 cc SV 25 cc) There was no mural apical thrombus Delayed enhancement images showed full thickness inferior wall scar at mid and apical levels with small area of subendocardial scar in the apex. IMPRESSION: 1. 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  Mild RVE with hypokinesis RVEF 22% 3.  Small but circumferential pericardial effusion 4.  Large bilateral pleural effusions 5.  No mural apical thrombus 6. Full thickness scar involving the mid and apical inferior wall with small area of subendocardial scar at apex 7.  Mild MR 8.  Dilated IVC 9.  Marked dyssynchrony between the anterior and inferior walls [place] Electronically Signed   By: [place] Rouge M.D.   On: 10/28/2018 15:32   Vas [place] Groin Pseudoaneurysm  Result Date: 10/24/2018  ARTERIAL PSEUDOANEURYSM  Exam: Right groin Indications: Patient complains of palpable knot. History: S/p catheterization. Performing Technologist: Maudry Mayhew MHA, RDMS, RVT, RDCS  Examination Guidelines: A complete evaluation includes B-mode imaging, spectral Doppler, color Doppler, and power Doppler as needed of all accessible portions of each vessel. Bilateral testing is considered an integral part of a complete examination. Limited examinations for reoccurring indications may be performed as noted. +------------+----------+----------+------+----------+ Right DuplexPSV (cm/s) Waveform PlaqueComment(s)  +------------+----------+----------+------+----------+ CFA             31    triphasic                  +------------+----------+----------+------+----------+ PTA             8     monophasic                 +------------+----------+----------+------+----------+ Right Vein comments:Common femoral vein is patent with phasic flow.  Findings: Multiple heterogenous areas are noted in the groin with low-resistant arterial vascularity. This is suggestive of possible prominent lymph nodes versus unknown etiology.  Summary: No evidence of pseudoaneurysm, AVF or DVT  Diagnosing physician: Servando Snare MD Electronically signed by Servando Snare MD on 10/24/2018 at 12:41:04 PM.   --------------------------------------------------------------------------------    Final    Ir Percutaneous Art Thrombectomy/infusion Intracranial Inc Diag Angio  Result Date: 10/23/2018 INDICATION: Left-sided weakness with right gaze preference and dysarthria. 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 EXAM: 1. EMERGENT LARGE VESSEL OCCLUSION THROMBOLYSIS anterior CIRCULATION) COMPARISON:  CT angiogram of the head and neck of 10/21/2015. MEDICATIONS: 2 g Ancef IV antibiotic was administered within 1 hour of the procedure. ANESTHESIA/SEDATION: General anesthesia. CONTRAST:  Isovue 300 approximately 100 mL. FLUOROSCOPY TIME:  Fluoroscopy Time: 50 minutes 46 seconds (3279 mGy). COMPLICATIONS: None immediate. TECHNIQUE: Following a full explanation of the procedure along with the potential associated complications, an informed witnessed consent was obtained from the patient's sister and daughter. The risks of intracranial hemorrhage of 10%, worsening neurological deficit, ventilator dependency, death and inability to revascularize were all reviewed in detail with the patient's sister and daughter. The patient was then put under general anesthesia by the Department of Anesthesiology at [facility]. The right groin was  prepped and draped in the usual sterile fashion. Thereafter using modified Seldinger technique, transfemoral access into the right common femoral artery was obtained without difficulty. Over a 0.035 inch guidewire a 5 French Pinnacle sheath was inserted. Through this, and also over a 0.035 inch guidewire a 5 [place] JB 1 catheter was advanced to the aortic arch region and selectively positioned in the innominate artery artery and the right common carotid artery and the  left common carotid artery. FINDINGS: Innominate artery angiogram demonstrates the right subclavian artery and the right common carotid artery origins to be widely patent. The right vertebral artery is patent. The vessel is seen to opacify to the cranial skull base. Opacification is seen of the right vertebrobasilar junction and the right posterior-inferior cerebellar artery. The opacified portion of the basilar artery, the posterior cerebral arteries and the superior cerebellar arteries is grossly intact on the lateral DSA images. Unopacified blood is seen in the basilar artery from the contralateral vertebral artery. The right common carotid arteriogram demonstrates wide patency of the right external carotid artery and its major branches. The right internal carotid artery at the bulb to the cranial skull base demonstrates wide patency. The petrous, cavernous and supraclinoid segments are widely patent. A right posterior communicating artery is seen opacifying the right posterior cerebral distribution. The right anterior cerebral artery opacifies into the capillary and venous phases. The right middle cerebral artery opacifies to the proximal right M1 segment with diminutive branches of the right MCA trifurcation. PROCEDURE: The diagnostic JB 1 catheter in right common carotid artery was then exchanged over a 0.035 inch 300 cm Rosen exchange guidewire for a 55 cm 8 [place] Brite tip neurovascular sheath. Good aspiration was obtained from the side port  of the neurovascular sheath. This was then connected to continuous heparinized saline infusion. Over the [facility] exchange guidewire, a 95 cm 8 [place] FlowGate guide catheter which had been prepped with 50% contrast and 50% heparinized saline infusion was advanced and positioned in the distal right internal carotid artery. The guidewire was removed. Good aspiration obtained from the hub of the [facility] guide catheter. A gentle contrast injection demonstrated no evidence of spasms, dissections or of intraluminal filling defects. Over a 0.014 inch Softip Synchro micro guidewire, a combination of a 132 cm 6 [place] Catalyst guide catheter inside of which was an 021Trevo ProVue microcatheter was advanced to the supraclinoid right ICA. The micro guidewire was then gently manipulated using a torque device into the M2 M3 region of the inferior division. The guidewire was removed. Good aspiration obtained from the hub of the microcatheter. A gentle contrast injection demonstrates safe position of tip of the microcatheter. This was then connected to continuous heparinized saline infusion. A 5 mm x 33 mm Embotrap retrieval device was then advanced to the distal end of the microcatheter. The Catalyst guide catheter was advanced into the mid M1 segment of the right middle cerebral artery. The O ring on the delivery microcatheter was then loosened. With slight forward gentle traction with the right hand on the delivery micro guidewire, with the left hand the delivery microcatheter was retrieved unsheathing the retrieval device. The 6 [place] Catalyst guide catheter was then advanced to oppose and engage the proximal portion of the clot. With proximal flow arrest in the right internal carotid artery by inflating the balloon of the FlowGate guide catheter, a combination of the retrieval device, the microcatheter, and the 6 [place] Catalyst guide catheter was then retrieved proximally as constant aspiration was applied with a 60 mL  syringe at the hub of the [facility] guide catheter, and a Penumbra aspiration device at the hub of the 6 [place] Catalyst guide catheter. The combination was retrieved and removed. Flow arrest was reversed in the right internal carotid artery. A control arteriogram performed through the [facility] guide catheter in the right internal carotid artery demonstrated now angiographic complete revascularization of the right middle cerebral M1 segment, and also a very  dominant inferior division with multiple branches. A TICI 2b revascularization had been achieved. There continued to be lack of visibility of the previously seen diminutive superior division branches. The combination of the Trevo ProVue microcatheter, the 6 [place] Catalyst guide catheter over a 0.014 inch Softip Synchro micro guidewire was then advanced to the right middle cerebral artery M1 segment. The micro guidewire was then exchanged for a 016 Fathom micro guidewire. The micro guidewire was then gently manipulated with a torque device to advance into the diminutive superior division into the M2 region. This was then followed by the microcatheter. The guidewire was removed. There was good aspiration at the hub of the microcatheter. Gentle control arteriogram performed demonstrated slow antegrade flow into these diminutive branches. A 5 mm x 33 mm Embotrap retrieval device was again advanced and deployed such that it covered the M2 region of superior division of the right middle cerebral artery. Again with constant aspiration through the Catalyst guide catheter at the origin of the superior division, and proximal flow arrest, the combination of the retrieval device, and the microcatheter in the 6 [place] Catalyst guide catheter were retrieved and removed as constant aspiration was applied at the hub of the [facility] guide catheter with a 60 mL syringe and Penumbra aspiration catheter at the hub of the 6 [place] Catalyst guide catheter. Following reversal of flow,  flow arrest a control arteriogram performed through the [facility] guide catheter demonstrated minimal opacification of the superior division of the right middle cerebral artery. Patient was given 25 mcg of nitroglycerin intra-arterially x3 in order to relieve the vasospasm with mild benefit. No further attempt was made to target the diminutive superior division branches as the patient had already received IV tPA. A final control arteriogram performed through the 8 [place] FlowGate guide catheter in the right common carotid artery demonstrated wide patency of the right internal carotid artery extra cranially and intracranially. There continued to be patency of the right middle cerebral artery branches and the right anterior cerebral artery branch into the capillary and venous phases. The area supplied by the diminutive branches of the superior division appeared to be gradually recanalizing. The right posterior communicating artery with opacification of both posterior cerebral arteries continued to be present. The left common carotid arteriogram demonstrated the left external carotid artery and its major branches to be widely patent. The left internal carotid artery at the bulb to the cranial skull base also demonstrates wide patency. The petrous, cavernous and supraclinoid segments demonstrate wide patency as well. Cross-filling via the anterior communicating artery of the right anterior cerebral A2 segment and distally was noted transiently. During the procedure, no evidence of extravasation or of mass effect or midline shift was noted. The 8 [place] FlowGate guide catheter was then retrieved into the abdominal aorta as was the 55 cm Brite tip neurovascular sheath. These were then exchanged over a J-tip guidewire for an 8 [place] Pinnacle sheath. An 8 French Pinnacle sheath was converted to a 7 [place] Pinnacle sheath in order to maintain antegrade flow into the right common femoral artery trifurcation regions. At the  end of the procedure, a flat panel CT of the brain demonstrated no evidence of gross mass effect, midline shift or of intracranial hemorrhage. Distal pulses remained Dopplerable in the dorsalis pedis and the posterior tibial regions bilaterally in both feet at the end of the procedure unchanged from prior to the procedure. The patient was then transported to the neuro ICU for post thrombectomy care. IMPRESSION: Status post  endovascular revascularization of occluded right middle cerebral artery distal M1 segment, achieving TICI 2b revascularization, with 2 passes with the 5 mm x 33 mm Embotrap retrieval device. PLAN: Follow-up in the clinic 4-6 weeks post discharge. Electronically Signed   By: Luanne Bras M.D.   On: 10/22/2018 15:40   Ct Head Code Stroke Wo Contrast  Result Date: 10/21/2018 CLINICAL DATA:  Code stroke.  Stroke.  Left-sided weakness aphasia EXAM: CT HEAD WITHOUT CONTRAST TECHNIQUE: Contiguous axial images were obtained from the base of the skull through the vertex without intravenous contrast. COMPARISON:  CT head 11/13/2017 FINDINGS: Brain: Generalized atrophy. Chronic ischemic changes in the white matter. Chronic infarct right anterior frontal lobe. No evidence of acute infarct, hemorrhage, mass Vascular: Hyperdense right MCA compatible with acute thrombus. This appears to be in the distal right M1 and M2 segments Skull: Negative Sinuses/Orbits: Chronic fracture right medial orbit. Chronic fracture left zygomatic arch. Cyst in the right maxillary sinus. No orbital mass lesion. Other: None ASPECTS ([place] Stroke Program Early CT Score) - Ganglionic level infarction (caudate, lentiform nuclei, internal capsule, insula, M1-M3 cortex): 7 - Supraganglionic infarction (M4-M6 cortex): 3 Total score (0-10 with 10 being normal): 10 IMPRESSION: 1. Atrophy and chronic ischemic change. No acute infarct or hemorrhage 2. Hyperdense right MCA compatible with acute thrombus. 3. ASPECTS is 10 4. These  results were called by telephone at the time of interpretation on 10/21/2018 at 5:48 pm to Dr. Cheral Marker , who verbally acknowledged these results. Electronically Signed   By: Franchot Gallo M.D.   On: 10/21/2018 17:49   [place] Ekg Site Rite  Result Date: 10/22/2018 If Site Rite image not attached, placement could not be confirmed due to current cardiac rhythm.  Ir Angio Intra Extracran Sel Com Carotid Innominate Uni L Mod Sed  Result Date: 10/23/2018 INDICATION: Left-sided weakness with right gaze preference and dysarthria. 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 EXAM: 1. EMERGENT LARGE VESSEL OCCLUSION THROMBOLYSIS anterior CIRCULATION) COMPARISON:  CT angiogram of the head and neck of 10/21/2015. MEDICATIONS: 2 g Ancef IV antibiotic was administered within 1 hour of the procedure. ANESTHESIA/SEDATION: General anesthesia. CONTRAST:  Isovue 300 approximately 100 mL. FLUOROSCOPY TIME:  Fluoroscopy Time: 50 minutes 46 seconds (3279 mGy). COMPLICATIONS: None immediate. TECHNIQUE: Following a full explanation of the procedure along with the potential associated complications, an informed witnessed consent was obtained from the patient's sister and daughter. The risks of intracranial hemorrhage of 10%, worsening neurological deficit, ventilator dependency, death and inability to revascularize were all reviewed in detail with the patient's sister and daughter. The patient was then put under general anesthesia by the Department of Anesthesiology at [facility]. The right groin was prepped and draped in the usual sterile fashion. Thereafter using modified Seldinger technique, transfemoral access into the right common femoral artery was obtained without difficulty. Over a 0.035 inch guidewire a 5 French Pinnacle sheath was inserted. Through this, and also over a 0.035 inch guidewire a 5 [place] JB 1 catheter was advanced to the aortic arch region and selectively positioned in the innominate artery  artery and the right common carotid artery and the left common carotid artery. FINDINGS: Innominate artery angiogram demonstrates the right subclavian artery and the right common carotid artery origins to be widely patent. The right vertebral artery is patent. The vessel is seen to opacify to the cranial skull base. Opacification is seen of the right vertebrobasilar junction and the right posterior-inferior cerebellar artery. The opacified portion of the basilar  artery, the posterior cerebral arteries and the superior cerebellar arteries is grossly intact on the lateral DSA images. Unopacified blood is seen in the basilar artery from the contralateral vertebral artery. The right common carotid arteriogram demonstrates wide patency of the right external carotid artery and its major branches. The right internal carotid artery at the bulb to the cranial skull base demonstrates wide patency. The petrous, cavernous and supraclinoid segments are widely patent. A right posterior communicating artery is seen opacifying the right posterior cerebral distribution. The right anterior cerebral artery opacifies into the capillary and venous phases. The right middle cerebral artery opacifies to the proximal right M1 segment with diminutive branches of the right MCA trifurcation. PROCEDURE: The diagnostic JB 1 catheter in right common carotid artery was then exchanged over a 0.035 inch 300 cm Rosen exchange guidewire for a 55 cm 8 French Brite tip neurovascular sheath. Good aspiration was obtained from the side port of the neurovascular sheath. This was then connected to continuous heparinized saline infusion. Over the [facility] exchange guidewire, a 95 cm 8 [place] FlowGate guide catheter which had been prepped with 50% contrast and 50% heparinized saline infusion was advanced and positioned in the distal right internal carotid artery. The guidewire was removed. Good aspiration obtained from the hub of the [facility] guide catheter. A  gentle contrast injection demonstrated no evidence of spasms, dissections or of intraluminal filling defects. Over a 0.014 inch Softip Synchro micro guidewire, a combination of a 132 cm 6 [place] Catalyst guide catheter inside of which was an 021Trevo ProVue microcatheter was advanced to the supraclinoid right ICA. The micro guidewire was then gently manipulated using a torque device into the M2 M3 region of the inferior division. The guidewire was removed. Good aspiration obtained from the hub of the microcatheter. A gentle contrast injection demonstrates safe position of tip of the microcatheter. This was then connected to continuous heparinized saline infusion. A 5 mm x 33 mm Embotrap retrieval device was then advanced to the distal end of the microcatheter. The Catalyst guide catheter was advanced into the mid M1 segment of the right middle cerebral artery. The O ring on the delivery microcatheter was then loosened. With slight forward gentle traction with the right hand on the delivery micro guidewire, with the left hand the delivery microcatheter was retrieved unsheathing the retrieval device. The 6 [place] Catalyst guide catheter was then advanced to oppose and engage the proximal portion of the clot. With proximal flow arrest in the right internal carotid artery by inflating the balloon of the FlowGate guide catheter, a combination of the retrieval device, the microcatheter, and the 6 [place] Catalyst guide catheter was then retrieved proximally as constant aspiration was applied with a 60 mL syringe at the hub of the [facility] guide catheter, and a Penumbra aspiration device at the hub of the 6 [place] Catalyst guide catheter. The combination was retrieved and removed. Flow arrest was reversed in the right internal carotid artery. A control arteriogram performed through the [facility] guide catheter in the right internal carotid artery demonstrated now angiographic complete revascularization of the right middle  cerebral M1 segment, and also a very dominant inferior division with multiple branches. A TICI 2b revascularization had been achieved. There continued to be lack of visibility of the previously seen diminutive superior division branches. The combination of the Trevo ProVue microcatheter, the 6 [place] Catalyst guide catheter over a 0.014 inch Softip Synchro micro guidewire was then advanced to the right middle cerebral artery M1 segment. The  micro guidewire was then exchanged for a 016 Fathom micro guidewire. The micro guidewire was then gently manipulated with a torque device to advance into the diminutive superior division into the M2 region. This was then followed by the microcatheter. The guidewire was removed. There was good aspiration at the hub of the microcatheter. Gentle control arteriogram performed demonstrated slow antegrade flow into these diminutive branches. A 5 mm x 33 mm Embotrap retrieval device was again advanced and deployed such that it covered the M2 region of superior division of the right middle cerebral artery. Again with constant aspiration through the Catalyst guide catheter at the origin of the superior division, and proximal flow arrest, the combination of the retrieval device, and the microcatheter in the 6 [place] Catalyst guide catheter were retrieved and removed as constant aspiration was applied at the hub of the [facility] guide catheter with a 60 mL syringe and Penumbra aspiration catheter at the hub of the 6 [place] Catalyst guide catheter. Following reversal of flow, flow arrest a control arteriogram performed through the [facility] guide catheter demonstrated minimal opacification of the superior division of the right middle cerebral artery. Patient was given 25 mcg of nitroglycerin intra-arterially x3 in order to relieve the vasospasm with mild benefit. No further attempt was made to target the diminutive superior division branches as the patient had already received IV tPA. A final  control arteriogram performed through the 8 [place] FlowGate guide catheter in the right common carotid artery demonstrated wide patency of the right internal carotid artery extra cranially and intracranially. There continued to be patency of the right middle cerebral artery branches and the right anterior cerebral artery branch into the capillary and venous phases. The area supplied by the diminutive branches of the superior division appeared to be gradually recanalizing. The right posterior communicating artery with opacification of both posterior cerebral arteries continued to be present. The left common carotid arteriogram demonstrated the left external carotid artery and its major branches to be widely patent. The left internal carotid artery at the bulb to the cranial skull base also demonstrates wide patency. The petrous, cavernous and supraclinoid segments demonstrate wide patency as well. Cross-filling via the anterior communicating artery of the right anterior cerebral A2 segment and distally was noted transiently. During the procedure, no evidence of extravasation or of mass effect or midline shift was noted. The 8 [place] FlowGate guide catheter was then retrieved into the abdominal aorta as was the 55 cm Brite tip neurovascular sheath. These were then exchanged over a J-tip guidewire for an 8 [place] Pinnacle sheath. An 8 French Pinnacle sheath was converted to a 7 [place] Pinnacle sheath in order to maintain antegrade flow into the right common femoral artery trifurcation regions. At the end of the procedure, a flat panel CT of the brain demonstrated no evidence of gross mass effect, midline shift or of intracranial hemorrhage. Distal pulses remained Dopplerable in the dorsalis pedis and the posterior tibial regions bilaterally in both feet at the end of the procedure unchanged from prior to the procedure. The patient was then transported to the neuro ICU for post thrombectomy care. IMPRESSION: Status post  endovascular revascularization of occluded right middle cerebral artery distal M1 segment, achieving TICI 2b revascularization, with 2 passes with the 5 mm x 33 mm Embotrap retrieval device. PLAN: Follow-up in the clinic 4-6 weeks post discharge. Electronically Signed   By: Luanne Bras M.D.   On: 10/22/2018 15:40   Ir Angio Vertebral Sel Subclavian Innominate Uni R  Mod Sed  Result Date: 10/23/2018 INDICATION: Left-sided weakness with right gaze preference and dysarthria. 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  EXAM: 1. EMERGENT LARGE VESSEL OCCLUSION THROMBOLYSIS anterior CIRCULATION)  COMPARISON:  CT angiogram of the head and neck of 10/21/2015.  MEDICATIONS: 2 g Ancef IV antibiotic was administered within 1 hour of the procedure.  ANESTHESIA/SEDATION: General anesthesia.  CONTRAST:  Isovue 300 approximately 100 mL.  FLUOROSCOPY TIME:  Fluoroscopy Time: 50 minutes 46 seconds (3279 mGy).  COMPLICATIONS: None immediate.  TECHNIQUE: Following a full explanation of the procedure along with the potential associated complications, an informed witnessed consent was obtained from the patient's sister and daughter. The risks of intracranial hemorrhage of 10%, worsening neurological deficit, ventilator dependency, death and inability to revascularize were all reviewed in detail with the patient's sister and daughter.  The patient was then put under general anesthesia by the Department of Anesthesiology at [facility].  The right groin was prepped and draped in the usual sterile fashion. Thereafter using modified Seldinger technique, transfemoral access into the right common femoral artery was obtained without difficulty. Over a 0.035 inch guidewire a 5 French Pinnacle sheath was inserted. Through this, and also over a 0.035 inch guidewire a 5 [place] JB 1 catheter was advanced to the aortic arch region and selectively positioned in the innominate artery artery and the right  common carotid artery and the left common carotid artery.  FINDINGS: Innominate artery angiogram demonstrates the right subclavian artery and the right common carotid artery origins to be widely patent.  The right vertebral artery is patent. The vessel is seen to opacify to the cranial skull base. Opacification is seen of the right vertebrobasilar junction and the right posterior-inferior cerebellar artery.  The opacified portion of the basilar artery, the posterior cerebral arteries and the superior cerebellar arteries is grossly intact on the lateral DSA images. Unopacified blood is seen in the basilar artery from the contralateral vertebral artery.  The right common carotid arteriogram demonstrates wide patency of the right external carotid artery and its major branches. The right internal carotid artery at the bulb to the cranial skull base demonstrates wide patency.  The petrous, cavernous and supraclinoid segments are widely patent.  A right posterior communicating artery is seen opacifying the right posterior cerebral distribution.  The right anterior cerebral artery opacifies into the capillary and venous phases. The right middle cerebral artery opacifies to the proximal right M1 segment with diminutive branches of the right MCA trifurcation.  PROCEDURE: The diagnostic JB 1 catheter in right common carotid artery was then exchanged over a 0.035 inch 300 cm Rosen exchange guidewire for a 55 cm 8 French Brite tip neurovascular sheath. Good aspiration was obtained from the side port of the neurovascular sheath. This was then connected to continuous heparinized saline infusion.  Over the [facility] exchange guidewire, a 95 cm 8 [place] FlowGate guide catheter which had been prepped with 50% contrast and 50% heparinized saline infusion was advanced and positioned in the distal right internal carotid artery. The guidewire was removed. Good aspiration obtained from the hub of the [facility] guide catheter. A  gentle contrast injection demonstrated no evidence of spasms, dissections or of intraluminal filling defects. Over a 0.014 inch Softip Synchro micro guidewire, a combination of a 132 cm 6 [place] Catalyst guide catheter inside of which was an 021Trevo ProVue microcatheter was advanced to the supraclinoid right ICA.  The micro guidewire was then gently manipulated using a torque device into the  M2 M3 region of the inferior division. The guidewire was removed. Good aspiration obtained from the hub of the microcatheter. A gentle contrast injection demonstrates safe position of tip of the microcatheter. This was then connected to continuous heparinized saline infusion. A 5 mm x 33 mm Embotrap retrieval device was then advanced to the distal end of the microcatheter. The Catalyst guide catheter was advanced into the mid M1 segment of the right middle cerebral artery.  The O ring on the delivery microcatheter was then loosened. With slight forward gentle traction with the right hand on the delivery micro guidewire, with the left hand the delivery microcatheter was retrieved unsheathing the retrieval device.  The 6 [place] Catalyst guide catheter was then advanced to oppose and engage the proximal portion of the clot.  With proximal flow arrest in the right internal carotid artery by inflating the balloon of the FlowGate guide catheter, a combination of the retrieval device, the microcatheter, and the 6 [place] Catalyst guide catheter was then retrieved proximally as constant aspiration was applied with a 60 mL syringe at the hub of the [facility] guide catheter, and a Penumbra aspiration device at the hub of the 6 [place] Catalyst guide catheter. The combination was retrieved and removed. Flow arrest was reversed in the right internal carotid artery.  A control arteriogram performed through the [facility] guide catheter in the right internal carotid artery demonstrated now angiographic complete revascularization of the right  middle cerebral M1 segment, and also a very dominant inferior division with multiple branches. A TICI 2b revascularization had been achieved.  There continued to be lack of visibility of the previously seen diminutive superior division branches.  The combination of the Trevo ProVue microcatheter, the 6 [place] Catalyst guide catheter over a 0.014 inch Softip Synchro micro guidewire was then advanced to the right middle cerebral artery M1 segment.  The micro guidewire was then exchanged for a 016 Fathom micro guidewire.  The micro guidewire was then gently manipulated with a torque device to advance into the diminutive superior division into the M2 region.  This was then followed by the microcatheter. The guidewire was removed. There was good aspiration at the hub of the microcatheter. Gentle control arteriogram performed demonstrated slow antegrade flow into these diminutive branches.  A 5 mm x 33 mm Embotrap retrieval device was again advanced and deployed such that it covered the M2 region of superior division of the right middle cerebral artery.  Again with constant aspiration through the Catalyst guide catheter at the origin of the superior division, and proximal flow arrest, the combination of the retrieval device, and the microcatheter in the 6 [place] Catalyst guide catheter were retrieved and removed as constant aspiration was applied at the hub of the [facility] guide catheter with a 60 mL syringe and Penumbra aspiration catheter at the hub of the 6 [place] Catalyst guide catheter.  Following reversal of flow, flow arrest a control arteriogram performed through the [facility] guide catheter demonstrated minimal opacification of the superior division of the right middle cerebral artery.  Patient was given 25 mcg of nitroglycerin intra-arterially x3 in order to relieve the vasospasm with mild benefit.  No further attempt was made to target the diminutive superior division branches as the patient had  already received IV tPA.  A final control arteriogram performed through the 8 [place] FlowGate guide catheter in the right common carotid artery demonstrated wide patency of the right internal carotid artery extra cranially and intracranially.  There continued to be patency  of the right middle cerebral artery branches and the right anterior cerebral artery branch into the capillary and venous phases. The area supplied by the diminutive branches of the superior division appeared to be gradually recanalizing. The right posterior communicating artery with opacification of both posterior cerebral arteries continued to be present.  The left common carotid arteriogram demonstrated the left external carotid artery and its major branches to be widely patent.  The left internal carotid artery at the bulb to the cranial skull base also demonstrates wide patency.  The petrous, cavernous and supraclinoid segments demonstrate wide patency as well.  Cross-filling via the anterior communicating artery of the right anterior cerebral A2 segment and distally was noted transiently.  During the procedure, no evidence of extravasation or of mass effect or midline shift was noted.  The 8 [place] FlowGate guide catheter was then retrieved into the abdominal aorta as was the 55 cm Brite tip neurovascular sheath. These were then exchanged over a J-tip guidewire for an 8 [place] Pinnacle sheath. An 8 French Pinnacle sheath was converted to a 7 [place] Pinnacle sheath in order to maintain antegrade flow into the right common femoral artery trifurcation regions.  At the end of the procedure, a flat panel CT of the brain demonstrated no evidence of gross mass effect, midline shift or of intracranial hemorrhage.  Distal pulses remained Dopplerable in the dorsalis pedis and the posterior tibial regions bilaterally in both feet at the end of the procedure unchanged from prior to the procedure.  The patient was then transported to the neuro  ICU for post thrombectomy care.  IMPRESSION: Status post endovascular revascularization of occluded right middle cerebral artery distal M1 segment, achieving TICI 2b revascularization, with 2 passes with the 5 mm x 33 mm Embotrap retrieval device.  PLAN: Follow-up in the clinic 4-6 weeks post discharge.   Electronically Signed   By: Luanne Bras M.D.   On: 10/22/2018 15:40    Assessment/Plan  1. Long term current use of anticoagulant - INR 3.4, will need to hold Comadin today and re-check INR tomorrow, 11/06/18  2. Cerebrovascular accident (CVA) due to embolism of right middle cerebral artery (HCC) -S/P tpA and thrombectomy on 5/25, stable, will hold Coumadin for today due to supratherapeutic INR, recheck tomorrow, 11/06/2018, continue carvedilol 3.125 mg twice daily and atorvastatin 40 mg daily    Family/ staff Communication:  ADON reported that resident left the facility and signed AMA form ([place]). He left with a friend to go to the bank.   Durenda Age, DNP, FNP-BC [facility] [phone] (Monday-Friday 8:00 a.m. - 5:00 p.m.) [phone] (after hours)

## 2018-11-05 NOTE — Telephone Encounter (Signed)
MM PAL 7/1 moved from 7/1 to 7/2 - confirmed with patient. Other appointments remain the same.

## 2018-11-06 ENCOUNTER — Encounter: Payer: Self-pay | Admitting: Internal Medicine

## 2018-11-06 DIAGNOSIS — R29818 Other symptoms and signs involving the nervous system: Secondary | ICD-10-CM | POA: Insufficient documentation

## 2018-11-06 DIAGNOSIS — R4189 Other symptoms and signs involving cognitive functions and awareness: Secondary | ICD-10-CM | POA: Insufficient documentation

## 2018-11-08 ENCOUNTER — Telehealth: Payer: Self-pay | Admitting: *Deleted

## 2018-11-08 NOTE — Telephone Encounter (Signed)
Call placed to pt re: virtual appt 11/13/2018.  Need to clarify if it's phone or video and get consent. No answer / no machine.

## 2018-11-08 NOTE — Telephone Encounter (Signed)
Follow  Up     Pt is returning call    Please call back

## 2018-11-11 NOTE — Telephone Encounter (Signed)
Call placed to pt again, re: virtual appt 11/13/2018. Need to clarify whether it's a video or phone and get consent. No answer / no voicemail.

## 2018-11-11 NOTE — Telephone Encounter (Signed)
Follow up ° ° °Patient is returning your call. Please call. ° ° ° °

## 2018-11-11 NOTE — Telephone Encounter (Signed)
Pt returned my call.  He has given verbal permission for the virtual visit, 11/13/2018 with Jared Kicks, NP.     Virtual Visit Pre-Appointment Phone Call  "(Name), I am calling you today to discuss your upcoming appointment. We are currently trying to limit exposure to the virus that causes COVID-19 by seeing patients at home rather than in the office."  1. "What is the BEST phone number to call the day of the visit?" - include this in appointment notes  2. "Do you have or have access to (through a family member/friend) a smartphone with video capability that we can use for your visit?" a. If yes - list this number in appt notes as "cell" (if different from BEST phone #) and list the appointment type as a VIDEO visit in appointment notes b. If no - list the appointment type as a PHONE visit in appointment notes  3. Confirm consent - "In the setting of the current Covid19 crisis, you are scheduled for a (phone or video) visit with your provider on (date) at (time).  Just as we do with many in-office visits, in order for you to participate in this visit, we must obtain consent.  If you'd like, I can send this to your mychart (if signed up) or email for you to review.  Otherwise, I can obtain your verbal consent now.  All virtual visits are billed to your insurance company just like a normal visit would be.  By agreeing to a virtual visit, we'd like you to understand that the technology does not allow for your provider to perform an examination, and thus may limit your provider's ability to fully assess your condition. If your provider identifies any concerns that need to be evaluated in person, we will make arrangements to do so.  Finally, though the technology is pretty good, we cannot assure that it will always work on either your or our end, and in the setting of a video visit, we may have to convert it to a phone-only visit.  In either situation, we cannot ensure that we have a secure connection.   Are you willing to proceed?" STAFF: Did the patient verbally acknowledge consent to telehealth visit? Document YES/NO here: YES  4. Advise patient to be prepared - "Two hours prior to your appointment, go ahead and check your blood pressure, pulse, oxygen saturation, and your weight (if you have the equipment to check those) and write them all down. When your visit starts, your provider will ask you for this information. If you have an Apple Watch or Kardia device, please plan to have heart rate information ready on the day of your appointment. Please have a pen and paper handy nearby the day of the visit as well."  5. Give patient instructions for MyChart download to smartphone OR Doximity/Doxy.me as below if video visit (depending on what platform provider is using)  6. Inform patient they will receive a phone call 15 minutes prior to their appointment time (may be from unknown caller ID) so they should be prepared to answer    TELEPHONE CALL NOTE  Jared Stevens has been deemed a candidate for a follow-up tele-health visit to limit community exposure during the Covid-19 pandemic. I spoke with the patient via phone to ensure availability of phone/video source, confirm preferred email & phone number, and discuss instructions and expectations.  I reminded Jared Stevens to be prepared with any vital sign and/or heart rhythm information that could potentially be obtained via home  monitoring, at the time of his visit. I reminded Jared Stevens to expect a phone call prior to his visit.  Jared Stevens, Jared Stevens 11/11/2018 9:11 AM   INSTRUCTIONS FOR DOWNLOADING THE MYCHART APP TO SMARTPHONE  - The patient must first make sure to have activated MyChart and know their login information - If Apple, go to [employer] and type in MyChart in the search bar and download the app. If Android, ask patient to go to [employer] and type in [place] in the search bar and download the app. The app is free but as  with any other app downloads, their phone may require them to verify saved payment information or Apple/Android password.  - The patient will need to then log into the app with their MyChart username and password, and select [facility] as their healthcare provider to link the account. When it is time for your visit, go to the MyChart app, find appointments, and click Begin Video Visit. Be sure to Select Allow for your device to access the Microphone and Camera for your visit. You will then be connected, and your provider will be with you shortly.  **If they have any issues connecting, or need assistance please contact MyChart service desk [phone])**  **If using a computer, in order to ensure the best quality for their visit they will need to use either of the following Internet Browsers: [employer], or Google Chrome**  IF USING DOXIMITY or DOXY.ME - The patient will receive a link just prior to their visit by text.     FULL LENGTH CONSENT FOR TELE-HEALTH VISIT   I hereby voluntarily request, consent and authorize [facility] and its employed or contracted physicians, physician assistants, nurse practitioners or other licensed health care professionals (the Practitioner), to provide me with telemedicine health care services (the "Services") as deemed necessary by the treating Practitioner. I acknowledge and consent to receive the Services by the Practitioner via telemedicine. I understand that the telemedicine visit will involve communicating with the Practitioner through live audiovisual communication technology and the disclosure of certain medical information by electronic transmission. I acknowledge that I have been given the opportunity to request an in-person assessment or other available alternative prior to the telemedicine visit and am voluntarily participating in the telemedicine visit.  I understand that I have the right to withhold or withdraw my consent to the  use of telemedicine in the course of my care at any time, without affecting my right to future care or treatment, and that the Practitioner or I may terminate the telemedicine visit at any time. I understand that I have the right to inspect all information obtained and/or recorded in the course of the telemedicine visit and may receive copies of available information for a reasonable fee.  I understand that some of the potential risks of receiving the Services via telemedicine include:  [place] Kitchen Delay or interruption in medical evaluation due to technological equipment failure or disruption; . Information transmitted may not be sufficient (e.g. poor resolution of images) to allow for appropriate medical decision making by the Practitioner; and/or  . In rare instances, security protocols could fail, causing a breach of personal health information.  Furthermore, I acknowledge that it is my responsibility to provide information about my medical history, conditions and care that is complete and accurate to the best of my ability. I acknowledge that Practitioner's advice, recommendations, and/or decision may be based on factors not within their control, such as incomplete or inaccurate  data provided by me or distortions of diagnostic images or specimens that may result from electronic transmissions. I understand that the practice of medicine is not an exact science and that Practitioner makes no warranties or guarantees regarding treatment outcomes. I acknowledge that I will receive a copy of this consent concurrently upon execution via email to the email address I last provided but may also request a printed copy by calling the office of [facility].    I understand that my insurance will be billed for this visit.   I have read or had this consent read to me. . I understand the contents of this consent, which adequately explains the benefits and risks of the Services being provided via telemedicine.  . I have  been provided ample opportunity to ask questions regarding this consent and the Services and have had my questions answered to my satisfaction. . I give my informed consent for the services to be provided through the use of telemedicine in my medical care  By participating in this telemedicine visit I agree to the above.

## 2018-11-12 NOTE — Progress Notes (Signed)
error    This encounter was created in error - please disregard.

## 2018-11-13 ENCOUNTER — Telehealth: Payer: Self-pay | Admitting: *Deleted

## 2018-11-13 ENCOUNTER — Encounter: Payer: Self-pay | Admitting: Cardiology

## 2018-11-13 ENCOUNTER — Encounter: Payer: Medicare HMO | Admitting: Cardiology

## 2018-11-13 ENCOUNTER — Other Ambulatory Visit: Payer: Self-pay

## 2018-11-13 NOTE — Telephone Encounter (Signed)
Call placed to pt's daughter.  Trying to arrange for pt to come in for an in-office visit since he wasn't reachable for phone visit today. Pt needs in-office visit, soon, and to have his INR checked. I will route to coumadin clinic, as well, so when pt is scheduled, hopefully he can be worked in.  Pt's daughter stated if they could find him a ride, she will call back to schedule appt.

## 2018-11-14 ENCOUNTER — Inpatient Hospital Stay: Payer: Medicare HMO | Admitting: Primary Care

## 2018-11-14 NOTE — Progress Notes (Deleted)
@Patient  ID: Jared Stevens, male    DOB: [birth year], 68 y.o.   MRN: [id number]  No chief complaint on file.   Referring provider: No ref. provider found  HPI: 68 year old male, current smoker. PMH significant for COPD, stage III non-small cell lung cancer (adenocarcinoma) status post chemoradiation 2018, recurrent right pleural effusion, acute on chronic respiratory failure with hypoxia, CHF exacerbation, stroke, encephalopathy.  Hospital admission 5/25- 10/29/2018 Patient presented with acute onset left-sided weakness, left facial droop, confusion and dysphasia. Echocardiogram with EF 15%.  History of adenocarcinoma of the lung status post chemotherapy and radiation in 2018.  MRI of head revealed acute patchy right MCA infarct involving right temporal lobe, right insula, basal ganglia and lateral frontal lobe.  Treated with IV TPA and IR with TICI 2B reperfusion.  Acute respiratory insufficiency post IR procedure.  Recurrent right pleural effusion, last thoracentesis 5/1 with 1.7 L clear yellow fluid removed transudate fluid.  Plan diuresis, if develops respiratory distress thoracentesis and send for cytology/culture.    No Known Allergies   There is no immunization history on file for this patient.  Past Medical History:  Diagnosis Date   Combined congestive systolic and diastolic heart failure (HCC)    COPD (chronic obstructive pulmonary disease) ([place])    Hypercholesteremia    Lung cancer ([place]) 03/2016    Tobacco History: Social History   Tobacco Use  Smoking Status Current Some Day Smoker  Smokeless Tobacco Never Used   Ready to quit: Not Answered Counseling given: Not Answered   Outpatient Medications Prior to Visit  Medication Sig Dispense Refill   amitriptyline (ELAVIL) 50 MG tablet Take 50 mg by mouth at bedtime.     atorvastatin (LIPITOR) 40 MG tablet Place 1 tablet (40 mg total) into feeding tube daily at 6 PM. 30 tablet 2   carvedilol (COREG) 3.125 MG  tablet Take 1 tablet (3.125 mg total) by mouth 2 (two) times daily with a meal. 60 tablet 3   diazepam (VALIUM) 5 MG tablet Take 1 tablet (5 mg total) by mouth every 4 (four) hours as needed. Back pain 30 tablet 0   furosemide (LASIX) 40 MG tablet Take 1 tablet (40 mg total) by mouth daily. 30 tablet 2   ipratropium (ATROVENT HFA) 17 MCG/ACT inhaler Administer two puffs into the lungs every six hours as needed for cough, congestion     latanoprost (XALATAN) 0.005 % ophthalmic solution Place 1 drop into both eyes 2 (two) times daily.   11   mometasone (ASMANEX) 220 MCG/INH inhaler Administer two puffs into the lungs twice a day for COPD Rinse after use. Shake inhaler before use     spironolactone (ALDACTONE) 25 MG tablet Take 0.5 tablets (12.5 mg total) by mouth daily. 15 tablet 2   traMADol (ULTRAM) 50 MG tablet Take 1 tablet (50 mg total) by mouth every 8 (eight) hours as needed. 20 tablet 0   warfarin (COUMADIN) 7.5 MG tablet Take 1 tablet (7.5 mg total) by mouth daily. 30 tablet 2   No facility-administered medications prior to visit.       Review of Systems  Review of Systems   Physical Exam  There were no vitals taken for this visit. Physical Exam   Lab Results:  CBC    Component Value Date/Time   WBC 8.2 10/29/2018 0500   RBC 4.61 10/29/2018 0500   HGB 13.3 10/29/2018 0500   HGB 13.6 08/23/2018 1205   HCT 40.7 10/29/2018 0500   PLT  335 10/29/2018 0500   PLT 415 (H) 08/23/2018 1205   MCV 88.3 10/29/2018 0500   MCH 28.9 10/29/2018 0500   MCHC 32.7 10/29/2018 0500   RDW 14.9 10/29/2018 0500   LYMPHSABS 1.4 10/22/2018 0425   MONOABS 0.6 10/22/2018 0425   EOSABS 0.1 10/22/2018 0425   BASOSABS 0.0 10/22/2018 0425    BMET    Component Value Date/Time   NA 141 10/29/2018 0500   K 4.1 10/29/2018 0500   CL 104 10/29/2018 0500   CO2 30 10/29/2018 0500   GLUCOSE 87 10/29/2018 0500   BUN 17 10/29/2018 0500   CREATININE 0.91 10/29/2018 0500   CREATININE 0.82  08/23/2018 1205   CALCIUM 8.8 (L) 10/29/2018 0500   GFRNONAA >60 10/29/2018 0500   GFRNONAA >60 08/23/2018 1205   GFRAA >60 10/29/2018 0500   GFRAA >60 08/23/2018 1205    BNP    Component Value Date/Time   BNP 1,225.5 (H) 09/27/2018 0103    ProBNP No results found for: PROBNP  Imaging: Ct Code Stroke Cta Head W/wo Contrast  Result Date: 10/21/2018 CLINICAL DATA:  Stroke.  Left-sided weakness. EXAM: CT ANGIOGRAPHY HEAD AND NECK CT PERFUSION BRAIN TECHNIQUE: Multidetector CT imaging of the head and neck was performed using the standard protocol during bolus administration of intravenous contrast. Multiplanar CT image reconstructions and MIPs were obtained to evaluate the vascular anatomy. Carotid stenosis measurements (when applicable) are obtained utilizing NASCET criteria, using the distal internal carotid diameter as the denominator. Multiphase CT imaging of the brain was performed following IV bolus contrast injection. Subsequent parametric perfusion maps were calculated using RAPID software. CONTRAST:  122mL OMNIPAQUE IOHEXOL 350 MG/ML SOLN COMPARISON:  CT head 10/21/2018 FINDINGS: CTA NECK FINDINGS Aortic arch: Standard branching. Proximal great vessels widely patent. Decreased attenuation descending thoracic aorta likely due to beam attenuation from a large right pleural effusion. Ascending aorta normal in caliber and opacification. Right carotid system: Right carotid system widely patent without stenosis. Mild atherosclerotic calcification right internal carotid artery below the skull base. Carotid bifurcation normal Left carotid system: Left carotid bifurcation widely patent without stenosis. No significant atherosclerotic disease. Vertebral arteries: Both vertebral arteries widely patent to the basilar without stenosis. Skeleton: Cervical spine spondylosis.  No acute bony abnormality Other neck: Negative for mass or adenopathy in the neck. Upper chest: Large right pleural effusion.  Patient has a history of lung cancer. Review of the MIP images confirms the above findings CTA HEAD FINDINGS Anterior circulation: Cavernous carotid widely patent bilaterally with mild atherosclerotic calcification. Right M1 segment patent proximally. There is thrombus in the distal right M1 segment. There is occlusive thrombus in right M2 branches with poor collateral flow. Minimal opacification of right middle cerebral artery branches. Azygos anterior cerebral artery which supplies both pericallosal arteries without stenosis. Left middle cerebral artery widely patent. Posterior circulation: Both vertebral arteries patent to the basilar. PICA patent bilaterally. Basilar widely patent. Superior cerebellar and posterior cerebral arteries patent bilaterally. Fetal origin right posterior cerebral artery. Venous sinuses: Minimal venous contrast due to arterial phase scanning Anatomic variants: None Delayed phase: Not performed Review of the MIP images confirms the above findings CT Brain Perfusion Findings: ASPECTS: 10 CBF (<30%) Volume: 83mL Perfusion (Tmax>6.0s) volume: 253mL Mismatch Volume: 136mL Infarction Location:Right MCA territory involving the frontotemporal and parietal lobe. IMPRESSION: 1. Acute thrombus distal right M1 segment extending into the M2 branches bilaterally. This is occlusive with minimal collateral flow and poor opacification of right MCA branches. 2. No other significant intracranial stenosis  or occlusion 3. Carotid bifurcation widely patent bilaterally. Both vertebral arteries patent. 4. 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 5. These results were called by telephone at the time of interpretation on 10/21/2018 at 6:17 pm to Dr. Kerney Elbe , who verbally acknowledged these results. Electronically Signed   By: Franchot Gallo M.D.   On: 10/21/2018 18:19   Ct Head Wo Contrast  Result Date: 10/22/2018 CLINICAL DATA:  Follow-up stroke EXAM: CT HEAD  WITHOUT CONTRAST TECHNIQUE: Contiguous axial images were obtained from the base of the skull through the vertex without intravenous contrast. COMPARISON:  10/21/2018 FINDINGS: Brain: No evidence of acute infarction, hemorrhage, hydrocephalus, extra-axial collection or mass lesion/mass effect. Periventricular white matter hypodensity. Vascular: No hyperdense vessel or unexpected calcification. Skull: Normal. Negative for fracture or focal lesion. Sinuses/Orbits: No acute finding. Other: None. IMPRESSION: No acute intracranial pathology. No CT evidence of acute stroke or hemorrhage. Small-vessel white matter disease. Electronically Signed   By: Eddie Candle M.D.   On: 10/22/2018 16:08   Ct Code Stroke Cta Neck W/wo Contrast  Result Date: 10/21/2018 CLINICAL DATA:  Stroke.  Left-sided weakness. EXAM: CT ANGIOGRAPHY HEAD AND NECK CT PERFUSION BRAIN TECHNIQUE: Multidetector CT imaging of the head and neck was performed using the standard protocol during bolus administration of intravenous contrast. Multiplanar CT image reconstructions and MIPs were obtained to evaluate the vascular anatomy. Carotid stenosis measurements (when applicable) are obtained utilizing NASCET criteria, using the distal internal carotid diameter as the denominator. Multiphase CT imaging of the brain was performed following IV bolus contrast injection. Subsequent parametric perfusion maps were calculated using RAPID software. CONTRAST:  174mL OMNIPAQUE IOHEXOL 350 MG/ML SOLN COMPARISON:  CT head 10/21/2018 FINDINGS: CTA NECK FINDINGS Aortic arch: Standard branching. Proximal great vessels widely patent. Decreased attenuation descending thoracic aorta likely due to beam attenuation from a large right pleural effusion. Ascending aorta normal in caliber and opacification. Right carotid system: Right carotid system widely patent without stenosis. Mild atherosclerotic calcification right internal carotid artery below the skull base. Carotid  bifurcation normal Left carotid system: Left carotid bifurcation widely patent without stenosis. No significant atherosclerotic disease. Vertebral arteries: Both vertebral arteries widely patent to the basilar without stenosis. Skeleton: Cervical spine spondylosis.  No acute bony abnormality Other neck: Negative for mass or adenopathy in the neck. Upper chest: Large right pleural effusion. Patient has a history of lung cancer. Review of the MIP images confirms the above findings CTA HEAD FINDINGS Anterior circulation: Cavernous carotid widely patent bilaterally with mild atherosclerotic calcification. Right M1 segment patent proximally. There is thrombus in the distal right M1 segment. There is occlusive thrombus in right M2 branches with poor collateral flow. Minimal opacification of right middle cerebral artery branches. Azygos anterior cerebral artery which supplies both pericallosal arteries without stenosis. Left middle cerebral artery widely patent. Posterior circulation: Both vertebral arteries patent to the basilar. PICA patent bilaterally. Basilar widely patent. Superior cerebellar and posterior cerebral arteries patent bilaterally. Fetal origin right posterior cerebral artery. Venous sinuses: Minimal venous contrast due to arterial phase scanning Anatomic variants: None Delayed phase: Not performed Review of the MIP images confirms the above findings CT Brain Perfusion Findings: ASPECTS: 10 CBF (<30%) Volume: 92mL Perfusion (Tmax>6.0s) volume: 27mL Mismatch Volume: 150mL Infarction Location:Right MCA territory involving the frontotemporal and parietal lobe. IMPRESSION: 1. Acute thrombus distal right M1 segment extending into the M2 branches bilaterally. This is occlusive with minimal collateral flow and poor opacification of right  MCA branches. 2. No other significant intracranial stenosis or occlusion 3. Carotid bifurcation widely patent bilaterally. Both vertebral arteries patent. 4. CT perfusion  positive for right MCA stroke. 46 mL core infarct with 158 mL penumbra. 5. These results were called by telephone at the time of interpretation on 10/21/2018 at 6:17 pm to Dr. Kerney Elbe , who verbally acknowledged these results. Electronically Signed   By: Franchot Gallo M.D.   On: 10/21/2018 18:19   Mr Brain Wo Contrast  Result Date: 10/23/2018 CLINICAL DATA:  Stroke follow-up.  Left-sided weakness EXAM: MRI HEAD WITHOUT CONTRAST TECHNIQUE: Multiplanar, multiecho pulse sequences of the brain and surrounding structures were obtained without intravenous contrast. COMPARISON:  Head CT from yesterday FINDINGS: Brain: Acute infarction in the superficial right temporal lobe, right insula, basal ganglia, and lateral frontal lobe. No acute infarcts seen in a separate distribution. No hemorrhagic conversion or significant mass effect. Mild or moderate chronic microvascular ischemic change seen in the cerebral white matter. Anterior right frontal lobe the encephalomalacia that may be posttraumatic given location and orbital findings. Vascular: Major flow voids are preserved. Skull and upper cervical spine: Negative for marrow lesion. Sinuses/Orbits: Chronic sinusitis with mild mucosal thickening. Remote medial orbital blowout fracture on the right. Retention cysts in the right maxillary sinus. IMPRESSION: 1. 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 2. Anterior right frontal lobe encephalomalacia, potentially posttraumatic given old facial fractures. Electronically Signed   By: Monte Fantasia M.D.   On: 10/23/2018 06:38   [place]  Result Date: 10/23/2018 INDICATION: Left-sided weakness with right gaze preference and dysarthria. 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 EXAM: 1. EMERGENT LARGE VESSEL OCCLUSION THROMBOLYSIS anterior CIRCULATION) COMPARISON:  CT angiogram of the head and neck of 10/21/2015. MEDICATIONS: 2 g Ancef IV antibiotic was  administered within 1 hour of the procedure. ANESTHESIA/SEDATION: General anesthesia. CONTRAST:  Isovue 300 approximately 100 mL. FLUOROSCOPY TIME:  Fluoroscopy Time: 50 minutes 46 seconds (3279 mGy). COMPLICATIONS: None immediate. TECHNIQUE: Following a full explanation of the procedure along with the potential associated complications, an informed witnessed consent was obtained from the patient's sister and daughter. The risks of intracranial hemorrhage of 10%, worsening neurological deficit, ventilator dependency, death and inability to revascularize were all reviewed in detail with the patient's sister and daughter. The patient was then put under general anesthesia by the Department of Anesthesiology at [facility]. The right groin was prepped and draped in the usual sterile fashion. Thereafter using modified Seldinger technique, transfemoral access into the right common femoral artery was obtained without difficulty. Over a 0.035 inch guidewire a 5 French Pinnacle sheath was inserted. Through this, and also over a 0.035 inch guidewire a 5 [place] JB 1 catheter was advanced to the aortic arch region and selectively positioned in the innominate artery artery and the right common carotid artery and the left common carotid artery. FINDINGS: Innominate artery angiogram demonstrates the right subclavian artery and the right common carotid artery origins to be widely patent. The right vertebral artery is patent. The vessel is seen to opacify to the cranial skull base. Opacification is seen of the right vertebrobasilar junction and the right posterior-inferior cerebellar artery. The opacified portion of the basilar artery, the posterior cerebral arteries and the superior cerebellar arteries is grossly intact on the lateral DSA images. Unopacified blood is seen in the basilar artery from the contralateral vertebral artery. The right common carotid arteriogram demonstrates wide patency of the  right external  carotid artery and its major branches. The right internal carotid artery at the bulb to the cranial skull base demonstrates wide patency. The petrous, cavernous and supraclinoid segments are widely patent. A right posterior communicating artery is seen opacifying the right posterior cerebral distribution. The right anterior cerebral artery opacifies into the capillary and venous phases. The right middle cerebral artery opacifies to the proximal right M1 segment with diminutive branches of the right MCA trifurcation. PROCEDURE: The diagnostic JB 1 catheter in right common carotid artery was then exchanged over a 0.035 inch 300 cm Rosen exchange guidewire for a 55 cm 8 French Brite tip neurovascular sheath. Good aspiration was obtained from the side port of the neurovascular sheath. This was then connected to continuous heparinized saline infusion. Over the [facility] exchange guidewire, a 95 cm 8 [place] FlowGate guide catheter which had been prepped with 50% contrast and 50% heparinized saline infusion was advanced and positioned in the distal right internal carotid artery. The guidewire was removed. Good aspiration obtained from the hub of the [facility] guide catheter. A gentle contrast injection demonstrated no evidence of spasms, dissections or of intraluminal filling defects. Over a 0.014 inch Softip Synchro micro guidewire, a combination of a 132 cm 6 [place] Catalyst guide catheter inside of which was an 021Trevo ProVue microcatheter was advanced to the supraclinoid right ICA. The micro guidewire was then gently manipulated using a torque device into the M2 M3 region of the inferior division. The guidewire was removed. Good aspiration obtained from the hub of the microcatheter. A gentle contrast injection demonstrates safe position of tip of the microcatheter. This was then connected to continuous heparinized saline infusion. A 5 mm x 33 mm Embotrap retrieval device was then advanced to the distal end of the  microcatheter. The Catalyst guide catheter was advanced into the mid M1 segment of the right middle cerebral artery. The O ring on the delivery microcatheter was then loosened. With slight forward gentle traction with the right hand on the delivery micro guidewire, with the left hand the delivery microcatheter was retrieved unsheathing the retrieval device. The 6 [place] Catalyst guide catheter was then advanced to oppose and engage the proximal portion of the clot. With proximal flow arrest in the right internal carotid artery by inflating the balloon of the FlowGate guide catheter, a combination of the retrieval device, the microcatheter, and the 6 [place] Catalyst guide catheter was then retrieved proximally as constant aspiration was applied with a 60 mL syringe at the hub of the [facility] guide catheter, and a Penumbra aspiration device at the hub of the 6 [place] Catalyst guide catheter. The combination was retrieved and removed. Flow arrest was reversed in the right internal carotid artery. A control arteriogram performed through the [facility] guide catheter in the right internal carotid artery demonstrated now angiographic complete revascularization of the right middle cerebral M1 segment, and also a very dominant inferior division with multiple branches. A TICI 2b revascularization had been achieved. There continued to be lack of visibility of the previously seen diminutive superior division branches. The combination of the Trevo ProVue microcatheter, the 6 [place] Catalyst guide catheter over a 0.014 inch Softip Synchro micro guidewire was then advanced to the right middle cerebral artery M1 segment. The micro guidewire was then exchanged for a 016 Fathom micro guidewire. The micro guidewire was then gently manipulated with a torque device to advance into the diminutive superior division into the M2 region. This was then followed by the microcatheter. The  guidewire was removed. There was good aspiration at the  hub of the microcatheter. Gentle control arteriogram performed demonstrated slow antegrade flow into these diminutive branches. A 5 mm x 33 mm Embotrap retrieval device was again advanced and deployed such that it covered the M2 region of superior division of the right middle cerebral artery. Again with constant aspiration through the Catalyst guide catheter at the origin of the superior division, and proximal flow arrest, the combination of the retrieval device, and the microcatheter in the 6 [place] Catalyst guide catheter were retrieved and removed as constant aspiration was applied at the hub of the [facility] guide catheter with a 60 mL syringe and Penumbra aspiration catheter at the hub of the 6 [place] Catalyst guide catheter. Following reversal of flow, flow arrest a control arteriogram performed through the [facility] guide catheter demonstrated minimal opacification of the superior division of the right middle cerebral artery. Patient was given 25 mcg of nitroglycerin intra-arterially x3 in order to relieve the vasospasm with mild benefit. No further attempt was made to target the diminutive superior division branches as the patient had already received IV tPA. A final control arteriogram performed through the 8 [place] FlowGate guide catheter in the right common carotid artery demonstrated wide patency of the right internal carotid artery extra cranially and intracranially. There continued to be patency of the right middle cerebral artery branches and the right anterior cerebral artery branch into the capillary and venous phases. The area supplied by the diminutive branches of the superior division appeared to be gradually recanalizing. The right posterior communicating artery with opacification of both posterior cerebral arteries continued to be present. The left common carotid arteriogram demonstrated the left external carotid artery and its major branches to be widely patent. The left internal carotid artery  at the bulb to the cranial skull base also demonstrates wide patency. The petrous, cavernous and supraclinoid segments demonstrate wide patency as well. Cross-filling via the anterior communicating artery of the right anterior cerebral A2 segment and distally was noted transiently. During the procedure, no evidence of extravasation or of mass effect or midline shift was noted. The 8 [place] FlowGate guide catheter was then retrieved into the abdominal aorta as was the 55 cm Brite tip neurovascular sheath. These were then exchanged over a J-tip guidewire for an 8 [place] Pinnacle sheath. An 8 French Pinnacle sheath was converted to a 7 [place] Pinnacle sheath in order to maintain antegrade flow into the right common femoral artery trifurcation regions. At the end of the procedure, a flat panel CT of the brain demonstrated no evidence of gross mass effect, midline shift or of intracranial hemorrhage. Distal pulses remained Dopplerable in the dorsalis pedis and the posterior tibial regions bilaterally in both feet at the end of the procedure unchanged from prior to the procedure. The patient was then transported to the neuro ICU for post thrombectomy care. IMPRESSION: Status post endovascular revascularization of occluded right middle cerebral artery distal M1 segment, achieving TICI 2b revascularization, with 2 passes with the 5 mm x 33 mm Embotrap retrieval device. PLAN: Follow-up in the clinic 4-6 weeks post discharge. Electronically Signed   By: Luanne Bras M.D.   On: 10/22/2018 15:40   Ct Code Stroke Cta Cerebral Perfusion W/wo Contrast  Result Date: 10/21/2018 CLINICAL DATA:  Stroke.  Left-sided weakness. EXAM: CT ANGIOGRAPHY HEAD AND NECK CT PERFUSION BRAIN TECHNIQUE: Multidetector CT imaging of the head and neck was performed using the standard protocol during bolus administration of intravenous contrast.  Multiplanar CT image reconstructions and MIPs were obtained to evaluate the vascular anatomy.  Carotid stenosis measurements (when applicable) are obtained utilizing NASCET criteria, using the distal internal carotid diameter as the denominator. Multiphase CT imaging of the brain was performed following IV bolus contrast injection. Subsequent parametric perfusion maps were calculated using RAPID software. CONTRAST:  131mL OMNIPAQUE IOHEXOL 350 MG/ML SOLN COMPARISON:  CT head 10/21/2018 FINDINGS: CTA NECK FINDINGS Aortic arch: Standard branching. Proximal great vessels widely patent. Decreased attenuation descending thoracic aorta likely due to beam attenuation from a large right pleural effusion. Ascending aorta normal in caliber and opacification. Right carotid system: Right carotid system widely patent without stenosis. Mild atherosclerotic calcification right internal carotid artery below the skull base. Carotid bifurcation normal Left carotid system: Left carotid bifurcation widely patent without stenosis. No significant atherosclerotic disease. Vertebral arteries: Both vertebral arteries widely patent to the basilar without stenosis. Skeleton: Cervical spine spondylosis.  No acute bony abnormality Other neck: Negative for mass or adenopathy in the neck. Upper chest: Large right pleural effusion. Patient has a history of lung cancer. Review of the MIP images confirms the above findings CTA HEAD FINDINGS Anterior circulation: Cavernous carotid widely patent bilaterally with mild atherosclerotic calcification. Right M1 segment patent proximally. There is thrombus in the distal right M1 segment. There is occlusive thrombus in right M2 branches with poor collateral flow. Minimal opacification of right middle cerebral artery branches. Azygos anterior cerebral artery which supplies both pericallosal arteries without stenosis. Left middle cerebral artery widely patent. Posterior circulation: Both vertebral arteries patent to the basilar. PICA patent bilaterally. Basilar widely patent. Superior cerebellar and  posterior cerebral arteries patent bilaterally. Fetal origin right posterior cerebral artery. Venous sinuses: Minimal venous contrast due to arterial phase scanning Anatomic variants: None Delayed phase: Not performed Review of the MIP images confirms the above findings CT Brain Perfusion Findings: ASPECTS: 10 CBF (<30%) Volume: 60mL Perfusion (Tmax>6.0s) volume: 266mL Mismatch Volume: 144mL Infarction Location:Right MCA territory involving the frontotemporal and parietal lobe. IMPRESSION: 1. Acute thrombus distal right M1 segment extending into the M2 branches bilaterally. This is occlusive with minimal collateral flow and poor opacification of right MCA branches. 2. No other significant intracranial stenosis or occlusion 3. Carotid bifurcation widely patent bilaterally. Both vertebral arteries patent. 4. 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 5. These results were called by telephone at the time of interpretation on 10/21/2018 at 6:17 pm to Dr. Kerney Elbe , who verbally acknowledged these results. Electronically Signed   By: Franchot Gallo M.D.   On: 10/21/2018 18:19   Dg Chest Port 1 View  Result Date: 10/24/2018 CLINICAL DATA:  68 year old male with a history of respiratory failure EXAM: PORTABLE CHEST 1 VIEW COMPARISON:  10/23/2018 FINDINGS: Cardiomediastinal silhouette unchanged in size and contour. Right heart border partially obscured by overlying lung/pleural disease. Increasing opacity at the right lung base with obscuration the right hemidiaphragm. Left lung relatively well aerated. Interval removal of the endotracheal tube. Interval removal of gastric tube. Unchanged left upper extremity PICC. IMPRESSION: Increasing opacity at the right lung base likely a combination of enlarging pleural fluid and associated atelectasis/consolidation. Interval removal of endotracheal tube and gastric tube. Unchanged PICC Electronically Signed   By: Corrie Mckusick D.O.    On: 10/24/2018 07:47   Dg Chest Port 1 View  Result Date: 10/23/2018 CLINICAL DATA:  Acute respiratory failure EXAM: PORTABLE CHEST 1 VIEW COMPARISON:  10/22/2018 FINDINGS: Support devices are stable. Moderate/large  right pleural effusion and right base atelectasis, stable. Cardiomegaly. Left lung clear. IMPRESSION: Stable right effusion with right lower lobe atelectasis. Cardiomegaly. Electronically Signed   By: Rolm Baptise M.D.   On: 10/23/2018 08:18   Portable Chest Xray  Result Date: 10/22/2018 CLINICAL DATA:  COPD, lung cancer, pleural effusion EXAM: PORTABLE CHEST 1 VIEW COMPARISON:  10/21/2018 FINDINGS: Endotracheal tube and NG tube are in place. Moderate to large right pleural effusion is stable since prior study. Right lower lobe atelectasis or infiltrate is stable. Left lung clear. No acute bony abnormality. IMPRESSION: Moderate to large-sized right pleural effusion with right lower lobe atelectasis or infiltrate, stable. Electronically Signed   By: Rolm Baptise M.D.   On: 10/22/2018 08:10   Dg Chest Portable 1 View  Result Date: 10/21/2018 CLINICAL DATA:  Intubation EXAM: PORTABLE CHEST 1 VIEW COMPARISON:  09/27/2018 FINDINGS: The endotracheal tube terminates above the carina by approximately 6 cm. There is a multiloculated right-sided pleural effusion which has increased in size from prior study. No evidence of a right-sided pneumothorax. The left lung field is mostly clear. IMPRESSION: 1. Endotracheal tube as above. 2. Interval increase in size of the right-sided pleural effusion. Electronically Signed   By: Constance Holster M.D.   On: 10/21/2018 18:55   Dg Abd Portable 1v  Result Date: 10/21/2018 CLINICAL DATA:  Orogastric tube placement EXAM: PORTABLE ABDOMEN - 1 VIEW COMPARISON:  None. FINDINGS: Orogastric tube tip and side port are in the stomach. There is moderate stool in the colon. There are loops of mildly dilated colon which likely is indicative of a degree of ileus. Bowel  obstruction not felt to be likely. No free air. There is a right pleural effusion with consolidation in the right lung base. IMPRESSION: Orogastric tube tip and side port in stomach. Suspect a degree of ileus. No free air. Right pleural effusion and right base consolidation noted. Electronically Signed   By: Lowella Grip III M.D.   On: 10/21/2018 23:25   Mr Cardiac Morphology W Wo Contrast  Result Date: 10/28/2018 CLINICAL DATA:  Cardiomyopathy, Stroke EXAM: CARDIAC MRI TECHNIQUE: The patient was scanned on a 1.5 Tesla Siemens magnet. A dedicated cardiac coil was used. Functional imaging was done using Fiesta sequences. 2,3, and 4 chamber views were done to assess for RWMA's. Modified Simpson's rule using a short axis stack was used to calculate an ejection fraction on a dedicated work Conservation officer, nature. The patient received Gadavist after 10 minutes inversion recovery sequences were used to assess for infiltration and scar tissue. CONTRAST:  Gadavist FINDINGS: There was mild LAE. Normal RA. No ASD/VSD. Dilated IVC. Normal aortic root. Normal appearing AV and MV. Mild appearing MR. Small pericardial effusion. Large bilateral pleural effusions. RV is mildly dilated and hypokinetic. The RVEF was 22% (EDV 165cc, ESV 129 cc SV 36 cc) The LV was severely dilated. There was diffuse hypokinesis worse in the inferior wall with marked anterior and inferior wall dyssynchrony The quantitative LVEF was 11% (EDV 234 cc ESV 209 cc SV 25 cc) There was no mural apical thrombus Delayed enhancement images showed full thickness inferior wall scar at mid and apical levels with small area of subendocardial scar in the apex. IMPRESSION: 1. 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  Mild RVE with hypokinesis RVEF 22% 3.  Small but circumferential pericardial effusion 4.  Large bilateral pleural effusions 5.  No mural apical thrombus 6. Full thickness scar involving the mid  and apical  inferior wall with small area of subendocardial scar at apex 7.  Mild MR 8.  Dilated IVC 9.  Marked dyssynchrony between the anterior and inferior walls [place] Electronically Signed   By: Jenkins Rouge M.D.   On: 10/28/2018 15:32   Vas [place] Groin Pseudoaneurysm  Result Date: 10/24/2018  ARTERIAL PSEUDOANEURYSM  Exam: Right groin Indications: Patient complains of palpable knot. History: S/p catheterization. Performing Technologist: Maudry Mayhew MHA, RDMS, RVT, RDCS  Examination Guidelines: A complete evaluation includes B-mode imaging, spectral Doppler, color Doppler, and power Doppler as needed of all accessible portions of each vessel. Bilateral testing is considered an integral part of a complete examination. Limited examinations for reoccurring indications may be performed as noted. +------------+----------+----------+------+----------+  Right Duplex PSV (cm/s)  Waveform  Plaque Comment(s)  +------------+----------+----------+------+----------+  CFA              31     triphasic                     +------------+----------+----------+------+----------+  PTA              8      monophasic                    +------------+----------+----------+------+----------+ Right Vein comments:Common femoral vein is patent with phasic flow.  Findings: Multiple heterogenous areas are noted in the groin with low-resistant arterial vascularity. This is suggestive of possible prominent lymph nodes versus unknown etiology.  Summary: No evidence of pseudoaneurysm, AVF or DVT  Diagnosing physician: Servando Snare MD Electronically signed by Servando Snare MD on 10/24/2018 at 12:41:04 PM.   --------------------------------------------------------------------------------    Final    Ir Percutaneous Art Thrombectomy/infusion Intracranial Inc Diag Angio  Result Date: 10/23/2018 INDICATION: Left-sided weakness with right gaze preference and dysarthria. 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 EXAM: 1. EMERGENT  LARGE VESSEL OCCLUSION THROMBOLYSIS anterior CIRCULATION) COMPARISON:  CT angiogram of the head and neck of 10/21/2015. MEDICATIONS: 2 g Ancef IV antibiotic was administered within 1 hour of the procedure. ANESTHESIA/SEDATION: General anesthesia. CONTRAST:  Isovue 300 approximately 100 mL. FLUOROSCOPY TIME:  Fluoroscopy Time: 50 minutes 46 seconds (3279 mGy). COMPLICATIONS: None immediate. TECHNIQUE: Following a full explanation of the procedure along with the potential associated complications, an informed witnessed consent was obtained from the patient's sister and daughter. The risks of intracranial hemorrhage of 10%, worsening neurological deficit, ventilator dependency, death and inability to revascularize were all reviewed in detail with the patient's sister and daughter. The patient was then put under general anesthesia by the Department of Anesthesiology at [facility]. The right groin was prepped and draped in the usual sterile fashion. Thereafter using modified Seldinger technique, transfemoral access into the right common femoral artery was obtained without difficulty. Over a 0.035 inch guidewire a 5 French Pinnacle sheath was inserted. Through this, and also over a 0.035 inch guidewire a 5 [place] JB 1 catheter was advanced to the aortic arch region and selectively positioned in the innominate artery artery and the right common carotid artery and the left common carotid artery. FINDINGS: Innominate artery angiogram demonstrates the right subclavian artery and the right common carotid artery origins to be widely patent. The right vertebral artery is patent. The vessel is seen to opacify to the cranial skull base. Opacification is seen of the right vertebrobasilar junction and the right posterior-inferior cerebellar artery. The opacified portion of the basilar artery, the posterior cerebral arteries and  the superior cerebellar arteries is grossly intact on the lateral DSA images. Unopacified blood  is seen in the basilar artery from the contralateral vertebral artery. The right common carotid arteriogram demonstrates wide patency of the right external carotid artery and its major branches. The right internal carotid artery at the bulb to the cranial skull base demonstrates wide patency. The petrous, cavernous and supraclinoid segments are widely patent. A right posterior communicating artery is seen opacifying the right posterior cerebral distribution. The right anterior cerebral artery opacifies into the capillary and venous phases. The right middle cerebral artery opacifies to the proximal right M1 segment with diminutive branches of the right MCA trifurcation. PROCEDURE: The diagnostic JB 1 catheter in right common carotid artery was then exchanged over a 0.035 inch 300 cm Rosen exchange guidewire for a 55 cm 8 French Brite tip neurovascular sheath. Good aspiration was obtained from the side port of the neurovascular sheath. This was then connected to continuous heparinized saline infusion. Over the [facility] exchange guidewire, a 95 cm 8 [place] FlowGate guide catheter which had been prepped with 50% contrast and 50% heparinized saline infusion was advanced and positioned in the distal right internal carotid artery. The guidewire was removed. Good aspiration obtained from the hub of the [facility] guide catheter. A gentle contrast injection demonstrated no evidence of spasms, dissections or of intraluminal filling defects. Over a 0.014 inch Softip Synchro micro guidewire, a combination of a 132 cm 6 [place] Catalyst guide catheter inside of which was an 021Trevo ProVue microcatheter was advanced to the supraclinoid right ICA. The micro guidewire was then gently manipulated using a torque device into the M2 M3 region of the inferior division. The guidewire was removed. Good aspiration obtained from the hub of the microcatheter. A gentle contrast injection demonstrates safe position of tip of the microcatheter. This  was then connected to continuous heparinized saline infusion. A 5 mm x 33 mm Embotrap retrieval device was then advanced to the distal end of the microcatheter. The Catalyst guide catheter was advanced into the mid M1 segment of the right middle cerebral artery. The O ring on the delivery microcatheter was then loosened. With slight forward gentle traction with the right hand on the delivery micro guidewire, with the left hand the delivery microcatheter was retrieved unsheathing the retrieval device. The 6 [place] Catalyst guide catheter was then advanced to oppose and engage the proximal portion of the clot. With proximal flow arrest in the right internal carotid artery by inflating the balloon of the FlowGate guide catheter, a combination of the retrieval device, the microcatheter, and the 6 [place] Catalyst guide catheter was then retrieved proximally as constant aspiration was applied with a 60 mL syringe at the hub of the [facility] guide catheter, and a Penumbra aspiration device at the hub of the 6 [place] Catalyst guide catheter. The combination was retrieved and removed. Flow arrest was reversed in the right internal carotid artery. A control arteriogram performed through the [facility] guide catheter in the right internal carotid artery demonstrated now angiographic complete revascularization of the right middle cerebral M1 segment, and also a very dominant inferior division with multiple branches. A TICI 2b revascularization had been achieved. There continued to be lack of visibility of the previously seen diminutive superior division branches. The combination of the Trevo ProVue microcatheter, the 6 [place] Catalyst guide catheter over a 0.014 inch Softip Synchro micro guidewire was then advanced to the right middle cerebral artery M1 segment. The micro guidewire was then exchanged for  a 016 Fathom micro guidewire. The micro guidewire was then gently manipulated with a torque device to advance into the  diminutive superior division into the M2 region. This was then followed by the microcatheter. The guidewire was removed. There was good aspiration at the hub of the microcatheter. Gentle control arteriogram performed demonstrated slow antegrade flow into these diminutive branches. A 5 mm x 33 mm Embotrap retrieval device was again advanced and deployed such that it covered the M2 region of superior division of the right middle cerebral artery. Again with constant aspiration through the Catalyst guide catheter at the origin of the superior division, and proximal flow arrest, the combination of the retrieval device, and the microcatheter in the 6 [place] Catalyst guide catheter were retrieved and removed as constant aspiration was applied at the hub of the [facility] guide catheter with a 60 mL syringe and Penumbra aspiration catheter at the hub of the 6 [place] Catalyst guide catheter. Following reversal of flow, flow arrest a control arteriogram performed through the [facility] guide catheter demonstrated minimal opacification of the superior division of the right middle cerebral artery. Patient was given 25 mcg of nitroglycerin intra-arterially x3 in order to relieve the vasospasm with mild benefit. No further attempt was made to target the diminutive superior division branches as the patient had already received IV tPA. A final control arteriogram performed through the 8 [place] FlowGate guide catheter in the right common carotid artery demonstrated wide patency of the right internal carotid artery extra cranially and intracranially. There continued to be patency of the right middle cerebral artery branches and the right anterior cerebral artery branch into the capillary and venous phases. The area supplied by the diminutive branches of the superior division appeared to be gradually recanalizing. The right posterior communicating artery with opacification of both posterior cerebral arteries continued to be present. The  left common carotid arteriogram demonstrated the left external carotid artery and its major branches to be widely patent. The left internal carotid artery at the bulb to the cranial skull base also demonstrates wide patency. The petrous, cavernous and supraclinoid segments demonstrate wide patency as well. Cross-filling via the anterior communicating artery of the right anterior cerebral A2 segment and distally was noted transiently. During the procedure, no evidence of extravasation or of mass effect or midline shift was noted. The 8 [place] FlowGate guide catheter was then retrieved into the abdominal aorta as was the 55 cm Brite tip neurovascular sheath. These were then exchanged over a J-tip guidewire for an 8 [place] Pinnacle sheath. An 8 French Pinnacle sheath was converted to a 7 [place] Pinnacle sheath in order to maintain antegrade flow into the right common femoral artery trifurcation regions. At the end of the procedure, a flat panel CT of the brain demonstrated no evidence of gross mass effect, midline shift or of intracranial hemorrhage. Distal pulses remained Dopplerable in the dorsalis pedis and the posterior tibial regions bilaterally in both feet at the end of the procedure unchanged from prior to the procedure. The patient was then transported to the neuro ICU for post thrombectomy care. IMPRESSION: Status post endovascular revascularization of occluded right middle cerebral artery distal M1 segment, achieving TICI 2b revascularization, with 2 passes with the 5 mm x 33 mm Embotrap retrieval device. PLAN: Follow-up in the clinic 4-6 weeks post discharge. Electronically Signed   By: Luanne Bras M.D.   On: 10/22/2018 15:40   Ct Head Code Stroke Wo Contrast  Result Date: 10/21/2018 CLINICAL DATA:  Code  stroke.  Stroke.  Left-sided weakness aphasia EXAM: CT HEAD WITHOUT CONTRAST TECHNIQUE: Contiguous axial images were obtained from the base of the skull through the vertex without intravenous  contrast. COMPARISON:  CT head 11/13/2017 FINDINGS: Brain: Generalized atrophy. Chronic ischemic changes in the white matter. Chronic infarct right anterior frontal lobe. No evidence of acute infarct, hemorrhage, mass Vascular: Hyperdense right MCA compatible with acute thrombus. This appears to be in the distal right M1 and M2 segments Skull: Negative Sinuses/Orbits: Chronic fracture right medial orbit. Chronic fracture left zygomatic arch. Cyst in the right maxillary sinus. No orbital mass lesion. Other: None ASPECTS ([place] Stroke Program Early CT Score) - Ganglionic level infarction (caudate, lentiform nuclei, internal capsule, insula, M1-M3 cortex): 7 - Supraganglionic infarction (M4-M6 cortex): 3 Total score (0-10 with 10 being normal): 10 IMPRESSION: 1. Atrophy and chronic ischemic change. No acute infarct or hemorrhage 2. Hyperdense right MCA compatible with acute thrombus. 3. ASPECTS is 10 4. These results were called by telephone at the time of interpretation on 10/21/2018 at 5:48 pm to Dr. Cheral Marker , who verbally acknowledged these results. Electronically Signed   By: Franchot Gallo M.D.   On: 10/21/2018 17:49   [place] Ekg Site Rite  Result Date: 10/22/2018 If Site Rite image not attached, placement could not be confirmed due to current cardiac rhythm.  Ir Angio Intra Extracran Sel Com Carotid Innominate Uni L Mod Sed  Result Date: 10/23/2018 INDICATION: Left-sided weakness with right gaze preference and dysarthria. 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 EXAM: 1. EMERGENT LARGE VESSEL OCCLUSION THROMBOLYSIS anterior CIRCULATION) COMPARISON:  CT angiogram of the head and neck of 10/21/2015. MEDICATIONS: 2 g Ancef IV antibiotic was administered within 1 hour of the procedure. ANESTHESIA/SEDATION: General anesthesia. CONTRAST:  Isovue 300 approximately 100 mL. FLUOROSCOPY TIME:  Fluoroscopy Time: 50 minutes 46 seconds (3279 mGy). COMPLICATIONS: None immediate. TECHNIQUE: Following a full  explanation of the procedure along with the potential associated complications, an informed witnessed consent was obtained from the patient's sister and daughter. The risks of intracranial hemorrhage of 10%, worsening neurological deficit, ventilator dependency, death and inability to revascularize were all reviewed in detail with the patient's sister and daughter. The patient was then put under general anesthesia by the Department of Anesthesiology at [facility]. The right groin was prepped and draped in the usual sterile fashion. Thereafter using modified Seldinger technique, transfemoral access into the right common femoral artery was obtained without difficulty. Over a 0.035 inch guidewire a 5 French Pinnacle sheath was inserted. Through this, and also over a 0.035 inch guidewire a 5 [place] JB 1 catheter was advanced to the aortic arch region and selectively positioned in the innominate artery artery and the right common carotid artery and the left common carotid artery. FINDINGS: Innominate artery angiogram demonstrates the right subclavian artery and the right common carotid artery origins to be widely patent. The right vertebral artery is patent. The vessel is seen to opacify to the cranial skull base. Opacification is seen of the right vertebrobasilar junction and the right posterior-inferior cerebellar artery. The opacified portion of the basilar artery, the posterior cerebral arteries and the superior cerebellar arteries is grossly intact on the lateral DSA images. Unopacified blood is seen in the basilar artery from the contralateral vertebral artery. The right common carotid arteriogram demonstrates wide patency of the right external carotid artery and its major branches. The right internal carotid artery at the bulb to the cranial skull base demonstrates wide patency. The petrous,  cavernous and supraclinoid segments are widely patent. A right posterior communicating artery is seen opacifying  the right posterior cerebral distribution. The right anterior cerebral artery opacifies into the capillary and venous phases. The right middle cerebral artery opacifies to the proximal right M1 segment with diminutive branches of the right MCA trifurcation. PROCEDURE: The diagnostic JB 1 catheter in right common carotid artery was then exchanged over a 0.035 inch 300 cm Rosen exchange guidewire for a 55 cm 8 French Brite tip neurovascular sheath. Good aspiration was obtained from the side port of the neurovascular sheath. This was then connected to continuous heparinized saline infusion. Over the [facility] exchange guidewire, a 95 cm 8 [place] FlowGate guide catheter which had been prepped with 50% contrast and 50% heparinized saline infusion was advanced and positioned in the distal right internal carotid artery. The guidewire was removed. Good aspiration obtained from the hub of the [facility] guide catheter. A gentle contrast injection demonstrated no evidence of spasms, dissections or of intraluminal filling defects. Over a 0.014 inch Softip Synchro micro guidewire, a combination of a 132 cm 6 [place] Catalyst guide catheter inside of which was an 021Trevo ProVue microcatheter was advanced to the supraclinoid right ICA. The micro guidewire was then gently manipulated using a torque device into the M2 M3 region of the inferior division. The guidewire was removed. Good aspiration obtained from the hub of the microcatheter. A gentle contrast injection demonstrates safe position of tip of the microcatheter. This was then connected to continuous heparinized saline infusion. A 5 mm x 33 mm Embotrap retrieval device was then advanced to the distal end of the microcatheter. The Catalyst guide catheter was advanced into the mid M1 segment of the right middle cerebral artery. The O ring on the delivery microcatheter was then loosened. With slight forward gentle traction with the right hand on the delivery micro guidewire, with  the left hand the delivery microcatheter was retrieved unsheathing the retrieval device. The 6 [place] Catalyst guide catheter was then advanced to oppose and engage the proximal portion of the clot. With proximal flow arrest in the right internal carotid artery by inflating the balloon of the FlowGate guide catheter, a combination of the retrieval device, the microcatheter, and the 6 [place] Catalyst guide catheter was then retrieved proximally as constant aspiration was applied with a 60 mL syringe at the hub of the [facility] guide catheter, and a Penumbra aspiration device at the hub of the 6 [place] Catalyst guide catheter. The combination was retrieved and removed. Flow arrest was reversed in the right internal carotid artery. A control arteriogram performed through the [facility] guide catheter in the right internal carotid artery demonstrated now angiographic complete revascularization of the right middle cerebral M1 segment, and also a very dominant inferior division with multiple branches. A TICI 2b revascularization had been achieved. There continued to be lack of visibility of the previously seen diminutive superior division branches. The combination of the Trevo ProVue microcatheter, the 6 [place] Catalyst guide catheter over a 0.014 inch Softip Synchro micro guidewire was then advanced to the right middle cerebral artery M1 segment. The micro guidewire was then exchanged for a 016 Fathom micro guidewire. The micro guidewire was then gently manipulated with a torque device to advance into the diminutive superior division into the M2 region. This was then followed by the microcatheter. The guidewire was removed. There was good aspiration at the hub of the microcatheter. Gentle control arteriogram performed demonstrated slow antegrade flow into these diminutive branches. A  5 mm x 33 mm Embotrap retrieval device was again advanced and deployed such that it covered the M2 region of superior division of the right  middle cerebral artery. Again with constant aspiration through the Catalyst guide catheter at the origin of the superior division, and proximal flow arrest, the combination of the retrieval device, and the microcatheter in the 6 [place] Catalyst guide catheter were retrieved and removed as constant aspiration was applied at the hub of the [facility] guide catheter with a 60 mL syringe and Penumbra aspiration catheter at the hub of the 6 [place] Catalyst guide catheter. Following reversal of flow, flow arrest a control arteriogram performed through the [facility] guide catheter demonstrated minimal opacification of the superior division of the right middle cerebral artery. Patient was given 25 mcg of nitroglycerin intra-arterially x3 in order to relieve the vasospasm with mild benefit. No further attempt was made to target the diminutive superior division branches as the patient had already received IV tPA. A final control arteriogram performed through the 8 [place] FlowGate guide catheter in the right common carotid artery demonstrated wide patency of the right internal carotid artery extra cranially and intracranially. There continued to be patency of the right middle cerebral artery branches and the right anterior cerebral artery branch into the capillary and venous phases. The area supplied by the diminutive branches of the superior division appeared to be gradually recanalizing. The right posterior communicating artery with opacification of both posterior cerebral arteries continued to be present. The left common carotid arteriogram demonstrated the left external carotid artery and its major branches to be widely patent. The left internal carotid artery at the bulb to the cranial skull base also demonstrates wide patency. The petrous, cavernous and supraclinoid segments demonstrate wide patency as well. Cross-filling via the anterior communicating artery of the right anterior cerebral A2 segment and distally was noted  transiently. During the procedure, no evidence of extravasation or of mass effect or midline shift was noted. The 8 [place] FlowGate guide catheter was then retrieved into the abdominal aorta as was the 55 cm Brite tip neurovascular sheath. These were then exchanged over a J-tip guidewire for an 8 [place] Pinnacle sheath. An 8 French Pinnacle sheath was converted to a 7 [place] Pinnacle sheath in order to maintain antegrade flow into the right common femoral artery trifurcation regions. At the end of the procedure, a flat panel CT of the brain demonstrated no evidence of gross mass effect, midline shift or of intracranial hemorrhage. Distal pulses remained Dopplerable in the dorsalis pedis and the posterior tibial regions bilaterally in both feet at the end of the procedure unchanged from prior to the procedure. The patient was then transported to the neuro ICU for post thrombectomy care. IMPRESSION: Status post endovascular revascularization of occluded right middle cerebral artery distal M1 segment, achieving TICI 2b revascularization, with 2 passes with the 5 mm x 33 mm Embotrap retrieval device. PLAN: Follow-up in the clinic 4-6 weeks post discharge. Electronically Signed   By: Luanne Bras M.D.   On: 10/22/2018 15:40   Ir Angio Vertebral Sel Subclavian Innominate Uni R Mod Sed  Result Date: 10/23/2018 INDICATION: Left-sided weakness with right gaze preference and dysarthria. 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  EXAM: 1. EMERGENT LARGE VESSEL OCCLUSION THROMBOLYSIS anterior CIRCULATION)  COMPARISON:  CT angiogram of the head and neck of 10/21/2015.  MEDICATIONS: 2 g Ancef IV antibiotic was administered within 1 hour of the procedure.  ANESTHESIA/SEDATION: General anesthesia.  CONTRAST:  Isovue 300 approximately 100 mL.  FLUOROSCOPY TIME:  Fluoroscopy Time: 50 minutes 46 seconds (3279 mGy).  COMPLICATIONS: None immediate.  TECHNIQUE: Following a full explanation of the  procedure along with the potential associated complications, an informed witnessed consent was obtained from the patient's sister and daughter. The risks of intracranial hemorrhage of 10%, worsening neurological deficit, ventilator dependency, death and inability to revascularize were all reviewed in detail with the patient's sister and daughter.  The patient was then put under general anesthesia by the Department of Anesthesiology at [facility].  The right groin was prepped and draped in the usual sterile fashion. Thereafter using modified Seldinger technique, transfemoral access into the right common femoral artery was obtained without difficulty. Over a 0.035 inch guidewire a 5 French Pinnacle sheath was inserted. Through this, and also over a 0.035 inch guidewire a 5 [place] JB 1 catheter was advanced to the aortic arch region and selectively positioned in the innominate artery artery and the right common carotid artery and the left common carotid artery.  FINDINGS: Innominate artery angiogram demonstrates the right subclavian artery and the right common carotid artery origins to be widely patent.  The right vertebral artery is patent. The vessel is seen to opacify to the cranial skull base. Opacification is seen of the right vertebrobasilar junction and the right posterior-inferior cerebellar artery.  The opacified portion of the basilar artery, the posterior cerebral arteries and the superior cerebellar arteries is grossly intact on the lateral DSA images. Unopacified blood is seen in the basilar artery from the contralateral vertebral artery.  The right common carotid arteriogram demonstrates wide patency of the right external carotid artery and its major branches. The right internal carotid artery at the bulb to the cranial skull base demonstrates wide patency.  The petrous, cavernous and supraclinoid segments are widely patent.  A right posterior communicating artery is seen opacifying the  right posterior cerebral distribution.  The right anterior cerebral artery opacifies into the capillary and venous phases. The right middle cerebral artery opacifies to the proximal right M1 segment with diminutive branches of the right MCA trifurcation.  PROCEDURE: The diagnostic JB 1 catheter in right common carotid artery was then exchanged over a 0.035 inch 300 cm Rosen exchange guidewire for a 55 cm 8 French Brite tip neurovascular sheath. Good aspiration was obtained from the side port of the neurovascular sheath. This was then connected to continuous heparinized saline infusion.  Over the [facility] exchange guidewire, a 95 cm 8 [place] FlowGate guide catheter which had been prepped with 50% contrast and 50% heparinized saline infusion was advanced and positioned in the distal right internal carotid artery. The guidewire was removed. Good aspiration obtained from the hub of the [facility] guide catheter. A gentle contrast injection demonstrated no evidence of spasms, dissections or of intraluminal filling defects. Over a 0.014 inch Softip Synchro micro guidewire, a combination of a 132 cm 6 [place] Catalyst guide catheter inside of which was an 021Trevo ProVue microcatheter was advanced to the supraclinoid right ICA.  The micro guidewire was then gently manipulated using a torque device into the M2 M3 region of the inferior division. The guidewire was removed. Good aspiration obtained from the hub of the microcatheter. A gentle contrast injection demonstrates safe position of tip of the microcatheter. This was then connected to continuous heparinized saline infusion. A 5 mm x 33 mm Embotrap retrieval device was then advanced to the distal end of the microcatheter. The Catalyst guide catheter was advanced into  the mid M1 segment of the right middle cerebral artery.  The O ring on the delivery microcatheter was then loosened. With slight forward gentle traction with the right hand on the delivery micro guidewire,  with the left hand the delivery microcatheter was retrieved unsheathing the retrieval device.  The 6 [place] Catalyst guide catheter was then advanced to oppose and engage the proximal portion of the clot.  With proximal flow arrest in the right internal carotid artery by inflating the balloon of the FlowGate guide catheter, a combination of the retrieval device, the microcatheter, and the 6 [place] Catalyst guide catheter was then retrieved proximally as constant aspiration was applied with a 60 mL syringe at the hub of the [facility] guide catheter, and a Penumbra aspiration device at the hub of the 6 [place] Catalyst guide catheter. The combination was retrieved and removed. Flow arrest was reversed in the right internal carotid artery.  A control arteriogram performed through the [facility] guide catheter in the right internal carotid artery demonstrated now angiographic complete revascularization of the right middle cerebral M1 segment, and also a very dominant inferior division with multiple branches. A TICI 2b revascularization had been achieved.  There continued to be lack of visibility of the previously seen diminutive superior division branches.  The combination of the Trevo ProVue microcatheter, the 6 [place] Catalyst guide catheter over a 0.014 inch Softip Synchro micro guidewire was then advanced to the right middle cerebral artery M1 segment.  The micro guidewire was then exchanged for a 016 Fathom micro guidewire.  The micro guidewire was then gently manipulated with a torque device to advance into the diminutive superior division into the M2 region.  This was then followed by the microcatheter. The guidewire was removed. There was good aspiration at the hub of the microcatheter. Gentle control arteriogram performed demonstrated slow antegrade flow into these diminutive branches.  A 5 mm x 33 mm Embotrap retrieval device was again advanced and deployed such that it covered the M2 region of superior  division of the right middle cerebral artery.  Again with constant aspiration through the Catalyst guide catheter at the origin of the superior division, and proximal flow arrest, the combination of the retrieval device, and the microcatheter in the 6 [place] Catalyst guide catheter were retrieved and removed as constant aspiration was applied at the hub of the [facility] guide catheter with a 60 mL syringe and Penumbra aspiration catheter at the hub of the 6 [place] Catalyst guide catheter.  Following reversal of flow, flow arrest a control arteriogram performed through the [facility] guide catheter demonstrated minimal opacification of the superior division of the right middle cerebral artery.  Patient was given 25 mcg of nitroglycerin intra-arterially x3 in order to relieve the vasospasm with mild benefit.  No further attempt was made to target the diminutive superior division branches as the patient had already received IV tPA.  A final control arteriogram performed through the 8 [place] FlowGate guide catheter in the right common carotid artery demonstrated wide patency of the right internal carotid artery extra cranially and intracranially.  There continued to be patency of the right middle cerebral artery branches and the right anterior cerebral artery branch into the capillary and venous phases. The area supplied by the diminutive branches of the superior division appeared to be gradually recanalizing. The right posterior communicating artery with opacification of both posterior cerebral arteries continued to be present.  The left common carotid arteriogram demonstrated the left external carotid artery and its major  branches to be widely patent.  The left internal carotid artery at the bulb to the cranial skull base also demonstrates wide patency.  The petrous, cavernous and supraclinoid segments demonstrate wide patency as well.  Cross-filling via the anterior communicating artery of the right anterior  cerebral A2 segment and distally was noted transiently.  During the procedure, no evidence of extravasation or of mass effect or midline shift was noted.  The 8 [place] FlowGate guide catheter was then retrieved into the abdominal aorta as was the 55 cm Brite tip neurovascular sheath. These were then exchanged over a J-tip guidewire for an 8 [place] Pinnacle sheath. An 8 French Pinnacle sheath was converted to a 7 [place] Pinnacle sheath in order to maintain antegrade flow into the right common femoral artery trifurcation regions.  At the end of the procedure, a flat panel CT of the brain demonstrated no evidence of gross mass effect, midline shift or of intracranial hemorrhage.  Distal pulses remained Dopplerable in the dorsalis pedis and the posterior tibial regions bilaterally in both feet at the end of the procedure unchanged from prior to the procedure.  The patient was then transported to the neuro ICU for post thrombectomy care.  IMPRESSION: Status post endovascular revascularization of occluded right middle cerebral artery distal M1 segment, achieving TICI 2b revascularization, with 2 passes with the 5 mm x 33 mm Embotrap retrieval device.  PLAN: Follow-up in the clinic 4-6 weeks post discharge.   Electronically Signed   By: Luanne Bras M.D.   On: 10/22/2018 15:40     Assessment & Plan:   No problem-specific Assessment & Plan notes found for this encounter.  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 - Management Per neuro - Continues eliquis  Adenocarcinoma lung stage III - PET scan December 2019 was negative however last CT chest from April 2020 showed some nodularity in the right lower lobe and needs 47-month follow-up imaging - As outpatient follow-up with oncology  CHF/Cardiomyopathy - Continue Coreg, Lipitor and Lasix - Follow-up cardiology and heart failure clinic  outpatient  Rcurrent right pleural effusion - Status post thoracentesis 5/1 with 1.7 L of clear fluid removed, transudative fluid - Plan diuresis - Thoracentesis only if develops respiratory distress and send for cytology/culture   Martyn Ehrich, NP 11/14/2018

## 2018-11-15 NOTE — Telephone Encounter (Signed)
If patient is still at [facility], then they are managing his INR while he is there.

## 2018-11-19 NOTE — Telephone Encounter (Signed)
Called patients daughter (on [place]) who states she thought patient went to appointment the next day. Daughter stated it would be best to call patient to set up appointment.  Called patient who stated that he needs transportation to appointments and wanted [place] to set them up. He stated that [address] (his PCP) has checked his INR and provided him transportation. I asked if his PCP is going to continue to manage his warfarin but he didn't know.  I have called [facility], who confirms they have seen patient and they do provide their own transportation. Awaiting call back to confirm PCP will continue to manage warfarin.

## 2018-11-19 NOTE — Telephone Encounter (Signed)
Received phone call from [facility]. They will be managing patients coumadin.

## 2018-11-22 ENCOUNTER — Ambulatory Visit (HOSPITAL_COMMUNITY): Admission: RE | Admit: 2018-11-22 | Payer: Medicare HMO | Source: Ambulatory Visit

## 2018-11-25 ENCOUNTER — Inpatient Hospital Stay: Payer: Medicare HMO | Attending: Internal Medicine

## 2018-11-25 ENCOUNTER — Telehealth: Payer: Self-pay | Admitting: Adult Health Nurse Practitioner

## 2018-11-25 ENCOUNTER — Ambulatory Visit (HOSPITAL_COMMUNITY): Payer: Medicare HMO

## 2018-11-25 ENCOUNTER — Other Ambulatory Visit: Payer: Self-pay

## 2018-11-25 DIAGNOSIS — C3490 Malignant neoplasm of unspecified part of unspecified bronchus or lung: Secondary | ICD-10-CM

## 2018-11-25 DIAGNOSIS — Z85118 Personal history of other malignant neoplasm of bronchus and lung: Secondary | ICD-10-CM | POA: Diagnosis present

## 2018-11-25 LAB — CMP (CANCER CENTER ONLY)
ALT: 27 U/L (ref 0–44)
AST: 29 U/L (ref 15–41)
Albumin: 3.7 g/dL (ref 3.5–5.0)
Alkaline Phosphatase: 102 U/L (ref 38–126)
Anion gap: 11 (ref 5–15)
BUN: 23 mg/dL (ref 8–23)
CO2: 29 mmol/L (ref 22–32)
Calcium: 9.6 mg/dL (ref 8.9–10.3)
Chloride: 103 mmol/L (ref 98–111)
Creatinine: 1 mg/dL (ref 0.61–1.24)
GFR, Est AFR Am: >60 mL/min (ref >60)
GFR, Estimated: >60 mL/min (ref >60)
Glucose, Bld: 114 mg/dL — ABNORMAL HIGH (ref 70–99)
Potassium: 4.2 mmol/L (ref 3.5–5.1)
Sodium: 143 mmol/L (ref 135–145)
Total Bilirubin: 0.3 mg/dL (ref 0.3–1.2)
Total Protein: 8 g/dL (ref 6.5–8.1)

## 2018-11-25 LAB — CBC WITH DIFFERENTIAL (CANCER CENTER ONLY)
Abs Immature Granulocytes: 0.02 10*3/uL (ref 0.00–0.07)
Basophils Absolute: 0.1 10*3/uL (ref 0.0–0.1)
Basophils Relative: 1 %
Eosinophils Absolute: 0.2 10*3/uL (ref 0.0–0.5)
Eosinophils Relative: 3 %
HCT: 43.9 % (ref 39.0–52.0)
Hemoglobin: 14.1 g/dL (ref 13.0–17.0)
Immature Granulocytes: 0 %
Lymphocytes Relative: 28 %
Lymphs Abs: 1.7 10*3/uL (ref 0.7–4.0)
MCH: 28.2 pg (ref 26.0–34.0)
MCHC: 32.1 g/dL (ref 30.0–36.0)
MCV: 87.8 fL (ref 80.0–100.0)
Monocytes Absolute: 0.6 10*3/uL (ref 0.1–1.0)
Monocytes Relative: 9 %
Neutro Abs: 3.6 10*3/uL (ref 1.7–7.7)
Neutrophils Relative %: 59 %
Platelet Count: 347 10*3/uL (ref 150–400)
RBC: 5 MIL/uL (ref 4.22–5.81)
RDW: 15.9 % — ABNORMAL HIGH (ref 11.5–15.5)
WBC Count: 6.1 10*3/uL (ref 4.0–10.5)
nRBC: 0 % (ref 0.0–0.2)

## 2018-11-25 NOTE — Telephone Encounter (Signed)
Called patient to set up appointment.  Michela Pitcher he was on his way to his daughter's place and asked if I could call him back tomorrow morning.  Said that I could call him as early as 8am.  Will call back tomorrow morning. Anette Barra K. Olena Heckle NP

## 2018-11-26 ENCOUNTER — Telehealth: Payer: Self-pay | Admitting: Adult Health Nurse Practitioner

## 2018-11-26 NOTE — Telephone Encounter (Signed)
Called and set up appointment for Thursday, 7/2, at [facility]. Olena Heckle NP

## 2018-11-27 ENCOUNTER — Other Ambulatory Visit: Payer: Self-pay

## 2018-11-27 ENCOUNTER — Ambulatory Visit: Payer: Medicare HMO | Admitting: Internal Medicine

## 2018-11-27 ENCOUNTER — Ambulatory Visit (HOSPITAL_COMMUNITY)
Admission: RE | Admit: 2018-11-27 | Discharge: 2018-11-27 | Disposition: A | Discharge status: Home / Self Care | Payer: Medicare HMO | Source: Ambulatory Visit | Attending: Internal Medicine | Admitting: Internal Medicine

## 2018-11-27 DIAGNOSIS — C3490 Malignant neoplasm of unspecified part of unspecified bronchus or lung: Secondary | ICD-10-CM | POA: Diagnosis not present

## 2018-11-27 MED ORDER — IOHEXOL 300 MG/ML  SOLN
75.0000 mL | Freq: Once | INTRAMUSCULAR | Status: AC | PRN
  Administered 2018-11-27: 75 mL via INTRAVENOUS

## 2018-11-27 MED ORDER — SODIUM CHLORIDE (PF) 0.9 % IJ SOLN
INTRAMUSCULAR | Status: AC
  Filled 2018-11-27: qty 50

## 2018-11-27 MED ORDER — IOHEXOL 300 MG/ML  SOLN: 100.0000 mL | Freq: Once | INTRAMUSCULAR | Status: DC | PRN

## 2018-11-28 ENCOUNTER — Inpatient Hospital Stay: Payer: Medicare HMO | Attending: Internal Medicine | Admitting: Internal Medicine

## 2018-11-28 ENCOUNTER — Telehealth: Payer: Self-pay | Admitting: *Deleted

## 2018-11-28 ENCOUNTER — Other Ambulatory Visit: Payer: Self-pay | Admitting: Adult Health Nurse Practitioner

## 2018-11-28 DIAGNOSIS — Z515 Encounter for palliative care: Secondary | ICD-10-CM

## 2018-11-28 NOTE — Telephone Encounter (Signed)
Pt no show appt -called to check pt status. Pt said he over did it yesterday and slept really good last night and just didn't feel like coming today" Message to scheduling -r/s today appt.

## 2018-11-28 NOTE — Progress Notes (Signed)
[facility] Palliative [facility] Consult Note Telephone: [phone]  Fax: [phone]  PATIENT NAME: Jared Stevens DOB: 1951-02-10 MRN: [id number]  PRIMARY CARE PROVIDER:  Dustin Folks NP  REFERRING PROVIDER: Dustin Folks NP  RESPONSIBLE PARTY:  Self [phone]             Jared Stevens, Jared Stevens, daughter  [phone]     RECOMMENDATIONS and PLAN:  1.  CVA.  In hospital 5/25-10/29/2018 for CVA.  Was started on warfarin and INR yesterday was 2.6.  This is monitored at the cancer center.  Has h/o lung cancer and is not in active treatment.  Had left sided weakness at time of stroke but not so much now.  Patient states that his right side feels weaker.  He takes a mile walk every morning. He uses a cane when he walks. This morning he missed his walk due to over doing it the day before.  He is able to do ADLs independently but tires easily.  Does have family near by that come by to help at times.    2.  Lung cancer.  Patient has history of lung cancer and is not in active treatment.  He has had recurrent right side pleural effusions in which he has been admitted to hospital to have drained.  There has been discussion of a pleurex.  Strongly consider using pleurex if this continues to prevent recurrent hospitalization.  Today he has no complaints of cough, SOB, chest pain.  Does have stable DOE  3.  Appetite.  States that his appetite is good.  States that he is supposed to start getting Meals on Wheels.  Does have food in his apartment and states that his brother is supposed to take him to the grocery store today.  States that he eats plenty of fruit, prunes, and cranberry juice and has no problems with constipation.  States that he drinks plenty of water.  4.  Goals of care.  Currently is a full code.  Gave him paperwork to look over MOST form and have an appointment in a week to discuss further.  Does state that his daughter, Jared Stevens, is HCPOA.  I spent 40  minutes providing this consultation,  from 9:00 to 9:40. More than 50% of the time in this consultation was spent coordinating communication.   HISTORY OF PRESENT ILLNESS:  Jared Stevens is a 68 y.o. year old male with multiple medical problems including recent CVA, CHF, HLD, COPD, lung cancer. Palliative Care was asked to help address goals of care.   CODE STATUS: Full Code  PPS: 60% HOSPICE ELIGIBILITY/DIAGNOSIS: TBD  PHYSICAL EXAM:   General: NAD, frail appearing, thin Extremities: no edema, no joint deformities Skin: no rashes Neurological: Weakness but otherwise nonfocal; does endorse having problems remembering future appointments   PAST MEDICAL HISTORY:  Past Medical History:  Diagnosis Date  . 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 COPD (chronic obstructive pulmonary disease) ([place])   . Hypercholesteremia   . Lung cancer ([place]) 03/2016    SOCIAL HX:  Social History   Tobacco Use  . Smoking status: Current Some Day Smoker  . Smokeless tobacco: Never Used  Substance Use Topics  . Alcohol use: Not Currently    ALLERGIES: No Known Allergies   PERTINENT MEDICATIONS:  Outpatient Encounter Medications as of 11/28/2018  Medication Sig  . amitriptyline (ELAVIL) 50 MG tablet Take 50 mg by mouth at bedtime.  Marland Kitchen atorvastatin (LIPITOR) 40 MG  tablet Place 1 tablet (40 mg total) into feeding tube daily at 6 PM.  . carvedilol (COREG) 3.125 MG tablet Take 1 tablet (3.125 mg total) by mouth 2 (two) times daily with a meal.  . diazepam (VALIUM) 5 MG tablet Take 1 tablet (5 mg total) by mouth every 4 (four) hours as needed. Back pain  . furosemide (LASIX) 40 MG tablet Take 1 tablet (40 mg total) by mouth daily.  Marland Kitchen ipratropium (ATROVENT HFA) 17 MCG/ACT inhaler Administer two puffs into the lungs every six hours as needed for cough, congestion  . latanoprost (XALATAN) 0.005 % ophthalmic solution Place 1 drop into both eyes 2 (two) times daily.   . mometasone  (ASMANEX) 220 MCG/INH inhaler Administer two puffs into the lungs twice a day for COPD Rinse after use. Shake inhaler before use  . spironolactone (ALDACTONE) 25 MG tablet Take 0.5 tablets (12.5 mg total) by mouth daily.  . traMADol (ULTRAM) 50 MG tablet Take 1 tablet (50 mg total) by mouth every 8 (eight) hours as needed.  . warfarin (COUMADIN) 7.5 MG tablet Take 1 tablet (7.5 mg total) by mouth daily.   No facility-administered encounter medications on file as of 11/28/2018.       Zoeie Ritter Jenetta Downer, NP

## 2018-12-03 ENCOUNTER — Other Ambulatory Visit: Payer: Self-pay

## 2018-12-03 ENCOUNTER — Other Ambulatory Visit: Payer: Medicare HMO | Admitting: Adult Health Nurse Practitioner

## 2018-12-03 DIAGNOSIS — Z515 Encounter for palliative care: Secondary | ICD-10-CM

## 2018-12-03 NOTE — Progress Notes (Signed)
    [facility] Consult Note Telephone: [phone]  Fax: [phone]  PATIENT NAME: Jared Stevens DOB: [birth year] MRN: [id number]  PRIMARY CARE PROVIDER: Dustin Folks NP  REFERRING PROVIDER: Dustin Folks NP  RESPONSIBLE PARTY:   Self [phone]                                              Jared Stevens, Jared Stevens, daughter  [phone]     RECOMMENDATIONS and PLAN:  Had appointment scheduled for 9am this morning.  Patient not at home.  Called patient and he is at his daughter's house getting ready to pick up furniture with his grandson.  Rescheduled for 12/05/2018 at 9:30.    I spent 0 minutes providing this consultation. More than 50% of the time in this consultation was spent coordinating communication.   HISTORY OF PRESENT ILLNESS:  Jared Stevens is a 68 y.o. year old male with multiple medical problems including recent CVA, CHF, HLD, COPD, lung cancer. Palliative Care was asked to help address goals of care.   CODE STATUS: Full code  PPS: 60% HOSPICE ELIGIBILITY/DIAGNOSIS: TBD  PAST MEDICAL HISTORY:  Past Medical History:  Diagnosis Date  . Combined congestive systolic and diastolic heart failure ([place])   . COPD (chronic obstructive pulmonary disease) ([place])   . Hypercholesteremia   . Lung cancer ([place]) 03/2016    SOCIAL HX:  Social History   Tobacco Use  . Smoking status: Current Some Day Smoker  . Smokeless tobacco: Never Used  Substance Use Topics  . Alcohol use: Not Currently    ALLERGIES: No Known Allergies   PERTINENT MEDICATIONS:  Outpatient Encounter Medications as of 12/03/2018  Medication Sig  . amitriptyline (ELAVIL) 50 MG tablet Take 50 mg by mouth at bedtime.  Marland Kitchen atorvastatin (LIPITOR) 40 MG tablet Place 1 tablet (40 mg total) into feeding tube daily at 6 PM.  . carvedilol (COREG) 3.125 MG tablet Take 1 tablet (3.125 mg total) by mouth 2 (two) times daily with a meal.  . diazepam (VALIUM) 5 MG tablet Take 1  tablet (5 mg total) by mouth every 4 (four) hours as needed. Back pain  . furosemide (LASIX) 40 MG tablet Take 1 tablet (40 mg total) by mouth daily.  Marland Kitchen ipratropium (ATROVENT HFA) 17 MCG/ACT inhaler Administer two puffs into the lungs every six hours as needed for cough, congestion  . latanoprost (XALATAN) 0.005 % ophthalmic solution Place 1 drop into both eyes 2 (two) times daily.   . mometasone (ASMANEX) 220 MCG/INH inhaler Administer two puffs into the lungs twice a day for COPD Rinse after use. Shake inhaler before use  . spironolactone (ALDACTONE) 25 MG tablet Take 0.5 tablets (12.5 mg total) by mouth daily.  . traMADol (ULTRAM) 50 MG tablet Take 1 tablet (50 mg total) by mouth every 8 (eight) hours as needed.  . warfarin (COUMADIN) 7.5 MG tablet Take 1 tablet (7.5 mg total) by mouth daily.   No facility-administered encounter medications on file as of 12/03/2018.        Jenetta Downer, NP

## 2018-12-05 ENCOUNTER — Other Ambulatory Visit: Payer: Medicare HMO | Admitting: Adult Health Nurse Practitioner

## 2018-12-05 DIAGNOSIS — Z515 Encounter for palliative care: Secondary | ICD-10-CM

## 2018-12-06 ENCOUNTER — Other Ambulatory Visit: Payer: Self-pay

## 2018-12-06 NOTE — Progress Notes (Signed)
[facility] Consult Note Telephone: [phone]  Fax: [phone]  PATIENT NAME: Jared Stevens DOB: 1950/07/22 MRN: [id number]  PRIMARY CARE PROVIDER:  Dustin Folks NP  REFERRING PROVIDER:Fred Tamala Julian NP  RESPONSIBLE PARTY:  Self [phone]                                              Jared Stevens, daughter  [phone]      RECOMMENDATIONS and PLAN:  1.  Goals of Care.  Went over MOST form and filled it out.  Patient does not like talking about future medical decisions but we discussed that this does not mean he is dying but that his wishes will be recorded for his providers and family if in the event he could not make his wishes known.  He endorses that he would like to have everything done to prolong life and wants to be a full code with full interventions.  We spent talking about his life history and he feels as if he has overcome so many accidents in his life that he can overcome future problems as well.  States that he has had family members live to be 68 yo and he hopes to as well. Overall patient is doing well right now with no complaints of SOB, cough, fever, chest pain, N/V/D.  States that he has constant pain in his back, hips, and knees but does not take anything for it.  States he has lived with it for so long that it is "just there."  His lung cancer is in remission and he sees his oncologist every 3 months for follow up.  He does endorse that his left foot drags sometimes when he walks, but he makes sure he walks daily.  He does use a cane when he walks outside of his apartment.   His MOST form was filled out, uploaded to Jared Stevens, and original left with him.  I spent 60 minutes providing this consultation,  from 9:30 to 10:30. More than 50% of the time in this consultation was spent coordinating communication.   HISTORY OF PRESENT ILLNESS:  Jared Stevens is a 68 y.o. year old male with multiple medical problems including  CVA, CHF, HLD, COPD, lung cancer. Palliative Care was asked to help address goals of care.   CODE STATUS: Full Code  PPS: 60% HOSPICE ELIGIBILITY/DIAGNOSIS: TBD  PHYSICAL EXAM:   General: NAD, frail appearing, thin Extremities: no edema, no joint deformities Skin: no rashes Neurological: Weakness but otherwise nonfocal; does endorse having problems remembering future appointments   PAST MEDICAL HISTORY:  Past Medical History:  Diagnosis Date  . Combined congestive systolic and diastolic heart failure ([place])   . COPD (chronic obstructive pulmonary disease) ([place])   . Hypercholesteremia   . Lung cancer ([place]) 03/2016    SOCIAL HX:  Social History   Tobacco Use  . Smoking status: Current Some Day Smoker  . Smokeless tobacco: Never Used  Substance Use Topics  . Alcohol use: Not Currently    ALLERGIES: No Known Allergies   PERTINENT MEDICATIONS:  Outpatient Encounter Medications as of 12/05/2018  Medication Sig  . amitriptyline (ELAVIL) 50 MG tablet Take 50 mg by mouth at bedtime.  Marland Kitchen atorvastatin (LIPITOR) 40 MG tablet Place 1 tablet (40 mg total) into feeding tube daily at 6 PM.  . carvedilol (COREG) 3.125 MG  tablet Take 1 tablet (3.125 mg total) by mouth 2 (two) times daily with a meal.  . diazepam (VALIUM) 5 MG tablet Take 1 tablet (5 mg total) by mouth every 4 (four) hours as needed. Back pain  . furosemide (LASIX) 40 MG tablet Take 1 tablet (40 mg total) by mouth daily.  Marland Kitchen ipratropium (ATROVENT HFA) 17 MCG/ACT inhaler Administer two puffs into the lungs every six hours as needed for cough, congestion  . latanoprost (XALATAN) 0.005 % ophthalmic solution Place 1 drop into both eyes 2 (two) times daily.   . mometasone (ASMANEX) 220 MCG/INH inhaler Administer two puffs into the lungs twice a day for COPD Rinse after use. Shake inhaler before use  . spironolactone (ALDACTONE) 25 MG tablet Take 0.5 tablets (12.5 mg total) by mouth daily.  . traMADol (ULTRAM) 50 MG tablet Take 1  tablet (50 mg total) by mouth every 8 (eight) hours as needed.  . warfarin (COUMADIN) 7.5 MG tablet Take 1 tablet (7.5 mg total) by mouth daily.   No facility-administered encounter medications on file as of 12/05/2018.      Jacquis Paxton Jenetta Downer, NP

## 2018-12-10 ENCOUNTER — Encounter: Payer: Self-pay | Admitting: Neurology

## 2018-12-10 ENCOUNTER — Ambulatory Visit (INDEPENDENT_AMBULATORY_CARE_PROVIDER_SITE_OTHER): Payer: Medicare HMO | Admitting: Neurology

## 2018-12-10 ENCOUNTER — Other Ambulatory Visit: Payer: Self-pay

## 2018-12-10 VITALS — BP 115/80 | HR 93 | Temp 97.5°F | Ht 72.0 in | Wt 138.0 lb

## 2018-12-10 DIAGNOSIS — I63511 Cerebral infarction due to unspecified occlusion or stenosis of right middle cerebral artery: Secondary | ICD-10-CM | POA: Diagnosis not present

## 2018-12-10 MED ORDER — CARVEDILOL 3.125 MG PO TABS: 3.1250 mg | ORAL_TABLET | Freq: Two times a day (BID) | ORAL | 0 refills | Status: DC

## 2018-12-10 NOTE — Progress Notes (Signed)
[facility] [address] [place]. [place] [address] [phone]       OFFICE FOLLOW-UP NOTE  Jared Stevens Date of Birth:  1951-01-16 Medical Record Number:  [id number]   HPI: Jared Stevens is a 68 year old African-American male seen today for initial office follow-up visit following hospital admission for stroke in May 2020.  History is obtained from the patient and review of electronic medical records.  I personally reviewed imaging films in PACS.JaredJester Atkinsis a 68 y.o.malewith history of ongoing tobacco use, COPD, CHF, cardiomyopathy (EF 15%), lung cancer, hx of substance abuse, and Hldpresenting with acute onset of left sided weakness with hemineglect, left facial droop, confusion and dysphasia after falling in bathroom. CT scan of the head on admission showed no acute pathology.  Patient met criteria for IV TPA which was administered uneventfully.  CT angiogram showed acute thrombus occluding the distal right M1 segment and extending into the M2 branches.  CT perfusion showed significant penumbra.  Patient was taken for emergent mechanical thrombectomy which was performed successfully withTICI 2b reperfusion achieved by Dr. Estanislado Pandy.  Patient was admitted to the intensive care unit and had close neurological monitoring and strict blood pressure control and did well.  MRI scan subsequently showed patchy right MCA infarct involving temporal lobes, right insula, basal ganglia and frontal lobe.  2D echo showed reduced ejection fraction of 15% but no definite clot.  LDL cholesterol was 94 mg percent and hemoglobin A1c was 5.7.  Urine drug screen was positive only for benzodiazepines.  Patient had been on aspirin which was switch to warfarin for anticoagulation.  He was transferred for rehabilitation to skilled nursing facility but patient signed himself out and his living at home and getting home therapy.  He states he is doing well.  He can walk about 1 to 2 miles.  He  uses a cane for outdoors but can walk indoors without it.  He states that his left leg drags a little bit but he can catch himself and avoid falls and injuries.  He is also noticed some decreased fine motor skills in his left hand.  He is tolerating warfarin well but his INR does fluctuate.  His last INR that I can find in the system was in 10/29/2018 and was optimal at 2.2.  Denies significant bleeding though he does bruise easily.  He has not had seen his cancer doctor for follow-up and is missed a recent appointment due to the [place] situation.  He has quit smoking.  ROS:   14 system review of systems is positive for leg weakness, gait difficulty, decreased stamina, tiredness, balance problems and all other systems negative  PMH:  Past Medical History:  Diagnosis Date  . Combined congestive systolic and diastolic heart failure ([place])   . COPD (chronic obstructive pulmonary disease) ([place])   . Hypercholesteremia   . Lung cancer ([place]) 03/2016    Social History:  Social History   Socioeconomic History  . Marital status: Single    Spouse name: Not on file  . Number of children: Not on file  . Years of education: Not on file  . Highest education level: Not on file  Occupational History  . Not on file  Social Needs  . Financial resource strain: Not on file  . Food insecurity    Worry: Not on file    Inability: Not on file  . Transportation needs    Medical: Not on file    Non-medical: Not on file  Tobacco  Use  . Smoking status: Current Some Day Smoker  . Smokeless tobacco: Never Used  Substance and Sexual Activity  . Alcohol use: Not Currently  . Drug use: Not Currently  . Sexual activity: Not on file  Lifestyle  . Physical activity    Days per week: Not on file    Minutes per session: Not on file  . Stress: Not on file  Relationships  . Social Herbalist on phone: Not on file    Gets together: Not on file    Attends religious service: Not on file    Active member  of club or organization: Not on file    Attends meetings of clubs or organizations: Not on file    Relationship status: Not on file  . Intimate partner violence    Fear of current or ex partner: Not on file    Emotionally abused: Not on file    Physically abused: Not on file    Forced sexual activity: Not on file  Other Topics Concern  . Not on file  Social History Narrative  . Not on file    Medications:   Current Outpatient Medications on File Prior to Visit  Medication Sig Dispense Refill  . amitriptyline (ELAVIL) 50 MG tablet Take 50 mg by mouth at bedtime.    Marland Kitchen atorvastatin (LIPITOR) 40 MG tablet Place 1 tablet (40 mg total) into feeding tube daily at 6 PM. 30 tablet 2  . diazepam (VALIUM) 5 MG tablet Take 1 tablet (5 mg total) by mouth every 4 (four) hours as needed. Back pain 30 tablet 0  . digoxin (LANOXIN) 0.125 MG tablet TK 1 T PO D    . furosemide (LASIX) 40 MG tablet Take 1 tablet (40 mg total) by mouth daily. 30 tablet 2  . ipratropium (ATROVENT HFA) 17 MCG/ACT inhaler Administer two puffs into the lungs every six hours as needed for cough, congestion    . latanoprost (XALATAN) 0.005 % ophthalmic solution Place 1 drop into both eyes 2 (two) times daily.   11  . mometasone (ASMANEX) 220 MCG/INH inhaler Administer two puffs into the lungs twice a day for COPD Rinse after use. Shake inhaler before use    . spironolactone (ALDACTONE) 25 MG tablet Take 0.5 tablets (12.5 mg total) by mouth daily. 15 tablet 2  . SYMBICORT 160-4.5 MCG/ACT inhaler INL 2 PFS PO BID IN THE MORNING AND IN THE EVE    . traMADol (ULTRAM) 50 MG tablet Take 1 tablet (50 mg total) by mouth every 8 (eight) hours as needed. 20 tablet 0  . warfarin (COUMADIN) 7.5 MG tablet Take 1 tablet (7.5 mg total) by mouth daily. 30 tablet 2   No current facility-administered medications on file prior to visit.     Allergies:  No Known Allergies  Physical Exam General: Frail middle-aged African-American male, seated,  in no evident distress Head: head normocephalic and atraumatic.  Neck: supple with no carotid or supraclavicular bruits Cardiovascular: regular rate and rhythm, no murmurs Musculoskeletal: no deformity Skin:  no rash/petichiae Vascular:  Normal pulses all extremities Vitals:   12/10/18 0936  BP: 115/80  Pulse: 93  Temp: (!) 97.5 F (36.4 C)   Neurologic Exam Mental Status: Awake and fully alert. Oriented to place and time. Recent and remote memory intact. Attention span, concentration and fund of knowledge appropriate. Mood and affect appropriate.  Cranial Nerves: Fundoscopic exam reveals sharp disc margins. Pupils equal, briskly reactive to light. Extraocular movements  full without nystagmus. Visual fields full to confrontation. Hearing intact. Facial sensation intact. Face, tongue, palate moves normally and symmetrically.  Motor: Normal bulk and tone. Normal strength in all tested extremity muscles.  Diminished fine finger movements on the left.  Orbits right over left upper extremity.  Increased tone in the left leg. Sensory.: intact to touch ,pinprick .position and vibratory sensation.  Coordination: Rapid alternating movements normal in all extremities. Finger-to-nose and heel-to-shin performed accurately bilaterally. Gait and Station: Arises from chair without difficulty. Stance is normal. Gait demonstrates normal stride length and balance but drags the left leg slightly..  Unable to tandem walk without difficulty.  Reflexes: 1+ and symmetric. Toes downgoing.   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  He is done remarkably well with hardly any residual physical deficits.  Vascular risk factors of hypercoagulability from adenocarcinoma of the lung, cardiomyopathy with low ejection  fraction, hyper lipidemia.,  And chronic smoking    PLAN:  had a long d/w patient about his recent stroke,cardiomyopathy risk for recurrent stroke/TIAs, personally independently reviewed imaging studies and stroke evaluation results and answered questions.Continue warfarin daily  for secondary stroke prevention with target INR between 2 and 3 and maintain strict control of hypertension with blood pressure goal below 130/90, diabetes with hemoglobin A1c goal below 6.5% and lipids with LDL cholesterol goal below 70 mg/dL. I also advised the patient to eat a healthy diet with plenty of whole grains, cereals, fruits and vegetables, exercise regularly and maintain ideal body weight Followup in the future with my nurse practitionerJessica in 3 months or call earlier if necessary. Greater than 50% of time during this 25 minute visit was spent on counseling,explanation of diagnosis embolic stroke, mechanical thrombectomy, planning of further management, discussion with patient and family and coordination of care Antony Contras, MD  [facility] [address] [place], [place] [address]  Phone [phone] Fax [phone] Note: This document was prepared with digital dictation and possible smart phrase technology. Any transcriptional errors that result from this process are unintentional

## 2018-12-10 NOTE — Patient Instructions (Signed)
I had a long d/w patient about his recent stroke,cardiomyopathy risk for recurrent stroke/TIAs, personally independently reviewed imaging studies and stroke evaluation results and answered questions.Continue warfarin daily  for secondary stroke prevention with target INR between 2 and 3 and maintain strict control of hypertension with blood pressure goal below 130/90, diabetes with hemoglobin A1c goal below 6.5% and lipids with LDL cholesterol goal below 70 mg/dL. I also advised the patient to eat a healthy diet with plenty of whole grains, cereals, fruits and vegetables, exercise regularly and maintain ideal body weight Followup in the future with my nurse practitionerJessica in 3 months or call earlier if necessary.  Stroke Prevention Some medical conditions and behaviors are associated with a higher chance of having a stroke. You can help prevent a stroke by making nutrition, lifestyle, and other changes, including managing any medical conditions you may have. What nutrition changes can be made?   Eat healthy foods. You can do this by: ? Choosing foods high in fiber, such as fresh fruits and vegetables and whole grains. ? Eating at least 5 or more servings of fruits and vegetables a day. Try to fill half of your plate at each meal with fruits and vegetables. ? Choosing lean protein foods, such as lean cuts of meat, poultry without skin, fish, tofu, beans, and nuts. ? Eating low-fat dairy products. ? Avoiding foods that are high in salt (sodium). This can help lower blood pressure. ? Avoiding foods that have saturated fat, trans fat, and cholesterol. This can help prevent high cholesterol. ? Avoiding processed and premade foods.  Follow your health care provider's specific guidelines for losing weight, controlling high blood pressure (hypertension), lowering high cholesterol, and managing diabetes. These may include: ? Reducing your daily calorie intake. ? Limiting your daily sodium intake to  1,500 milligrams (mg). ? Using only healthy fats for cooking, such as olive oil, canola oil, or sunflower oil. ? Counting your daily carbohydrate intake. What lifestyle changes can be made?  Maintain a healthy weight. Talk to your health care provider about your ideal weight.  Get at least 30 minutes of moderate physical activity at least 5 days a week. Moderate activity includes brisk walking, biking, and swimming.  Do not use any products that contain nicotine or tobacco, such as cigarettes and e-cigarettes. If you need help quitting, ask your health care provider. It may also be helpful to avoid exposure to secondhand smoke.  Limit alcohol intake to no more than 1 drink a day for nonpregnant women and 2 drinks a day for men. One drink equals 12 oz of beer, 5 oz of wine, or 1 oz of hard liquor.  Stop any illegal drug use.  Avoid taking birth control pills. Talk to your health care provider about the risks of taking birth control pills if: ? You are over 21 years old. ? You smoke. ? You get migraines. ? You have ever had a blood clot. What other changes can be made?  Manage your cholesterol levels. ? Eating a healthy diet is important for preventing high cholesterol. If cholesterol cannot be managed through diet alone, you may also need to take medicines. ? Take any prescribed medicines to control your cholesterol as told by your health care provider.  Manage your diabetes. ? Eating a healthy diet and exercising regularly are important parts of managing your blood sugar. If your blood sugar cannot be managed through diet and exercise, you may need to take medicines. ? Take any prescribed medicines to  control your diabetes as told by your health care provider.  Control your hypertension. ? To reduce your risk of stroke, try to keep your blood pressure below 130/80. ? Eating a healthy diet and exercising regularly are an important part of controlling your blood pressure. If your blood  pressure cannot be managed through diet and exercise, you may need to take medicines. ? Take any prescribed medicines to control hypertension as told by your health care provider. ? Ask your health care provider if you should monitor your blood pressure at home. ? Have your blood pressure checked every year, even if your blood pressure is normal. Blood pressure increases with age and some medical conditions.  Get evaluated for sleep disorders (sleep apnea). Talk to your health care provider about getting a sleep evaluation if you snore a lot or have excessive sleepiness.  Take over-the-counter and prescription medicines only as told by your health care provider. Aspirin or blood thinners (antiplatelets or anticoagulants) may be recommended to reduce your risk of forming blood clots that can lead to stroke.  Make sure that any other medical conditions you have, such as atrial fibrillation or atherosclerosis, are managed. What are the warning signs of a stroke? The warning signs of a stroke can be easily remembered as BEFAST.  B is for balance. Signs include: ? Dizziness. ? Loss of balance or coordination. ? Sudden trouble walking.  E is for eyes. Signs include: ? A sudden change in vision. ? Trouble seeing.  F is for face. Signs include: ? Sudden weakness or numbness of the face. ? The face or eyelid drooping to one side.  A is for arms. Signs include: ? Sudden weakness or numbness of the arm, usually on one side of the body.  S is for speech. Signs include: ? Trouble speaking (aphasia). ? Trouble understanding.  T is for time. ? These symptoms may represent a serious problem that is an emergency. Do not wait to see if the symptoms will go away. Get medical help right away. Call your local emergency services (911 in the [place] Do not drive yourself to the hospital.  Other signs of stroke may include: ? A sudden, severe headache with no known cause. ? Nausea or vomiting. ?  Seizure. Where to find more information For more information, visit:  American Stroke Association: www.strokeassociation.org  National Stroke Association: www.stroke.org Summary  You can prevent a stroke by eating healthy, exercising, not smoking, limiting alcohol intake, and managing any medical conditions you may have.  Do not use any products that contain nicotine or tobacco, such as cigarettes and e-cigarettes. If you need help quitting, ask your health care provider. It may also be helpful to avoid exposure to secondhand smoke.  Remember BEFAST for warning signs of stroke. Get help right away if you or a loved one has any of these signs. This information is not intended to replace advice given to you by your health care provider. Make sure you discuss any questions you have with your health care provider. Document Released: 06/22/2004 Document Revised: 04/27/2017 Document Reviewed: 06/20/2016 Elsevier Patient Education  2020 [employer].

## 2018-12-18 NOTE — Progress Notes (Deleted)
Cardiology Office Note   Date:  12/18/2018   ID:  Jared Stevens, DOB 1950/11/08, MRN [id number]  PCP:  Sonia Side., FNP  Cardiologist:  Dr. Acie Fredrickson    No chief complaint on file.     History of Present Illness: Jared Stevens is a 68 y.o. 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 Right and left heart cath with chronically occluded RCA with collaterals, no intervention. [place] numbers well preserved. ( RA 1, RV 21/2, PA 22/11/ PCWP 7, Fick 4.7/2.7) It was felt his heart failure was nonischemic in nature and, therefore, no lifevest was prescribed. CHF thought to be due to chemotherapy, HTN, and cocaine use. He also had right thoracentesis on 09/27/18 for recurrent pleural effusion with 1.7 L removed. He was discharged with ASA, statin, and coreg. Were not able to titrate heart failure medications prior to discharge  Readmitted 10/21/18 with CVA. right MCA infarct on 10/21/18 for acute on chronic combined CHF and admitted with CVA and rec'd TPA and thrombectomy.  He was   Diuresed 8.1 L this admit.   EF 11% by MRI, inf scar  Ischemic and non ischemic cardiomyopathy.   During hospitalization had NSVT EP did not believe candidate for life vest.  D/c'd on coreg, dig, spiro, add entresto or ARB, or Bidil as BP allows. and coumadin.   Stop ASA with INR > 2, tobacco, statins Cardiac MRI with RVEF 22%, small but circumferential pericardial effusion, large bil pl effusions  Repeat labs.   Past Medical History:  Diagnosis Date  . Combined congestive systolic and diastolic heart failure ([place])   . COPD (chronic obstructive pulmonary  disease) ([place])   . Hypercholesteremia   . Lung cancer ([place]) 03/2016    Past Surgical History:  Procedure Laterality Date  . Arm Surgery    . HERNIA REPAIR    . IR ANGIO INTRA EXTRACRAN SEL COM CAROTID INNOMINATE UNI L MOD SED  10/21/2018  . IR ANGIO VERTEBRAL SEL SUBCLAVIAN INNOMINATE UNI R MOD SED  10/21/2018  . IR CT HEAD LTD  10/21/2018  . IR PERCUTANEOUS ART THROMBECTOMY/INFUSION INTRACRANIAL INC DIAG ANGIO  10/21/2018  . IR THORACENTESIS ASP PLEURAL SPACE W/IMG GUIDE  09/27/2018  . RADIOLOGY WITH ANESTHESIA N/A 10/21/2018   Procedure: RADIOLOGY WITH ANESTHESIA;  Surgeon: Luanne Bras, MD;  Location: [facility];  Service: Radiology;  Laterality: N/A;  . REPLANTATION THUMB    . RIGHT/LEFT HEART CATH AND CORONARY ANGIOGRAPHY N/A 10/01/2018   Procedure: RIGHT/LEFT HEART CATH AND CORONARY ANGIOGRAPHY;  Surgeon: Troy Sine, MD;  Location: [facility];  Service: Cardiovascular;  Laterality: N/A;     Current Outpatient Medications  Medication Sig Dispense Refill  . amitriptyline (ELAVIL) 50 MG tablet Take 50 mg by mouth at bedtime.    Marland Kitchen atorvastatin (LIPITOR) 40 MG tablet Place 1 tablet (40 mg total) into feeding tube daily at 6 PM. 30 tablet 2  . carvedilol (COREG) 3.125 MG tablet Take 1 tablet (3.125 mg total) by mouth 2 (two) times daily with a meal. 60 tablet 0  . diazepam (VALIUM) 5 MG tablet Take 1 tablet (5 mg total) by mouth every 4 (four) hours as  needed. Back pain 30 tablet 0  . digoxin (LANOXIN) 0.125 MG tablet TK 1 T PO D    . furosemide (LASIX) 40 MG tablet Take 1 tablet (40 mg total) by mouth daily. 30 tablet 2  . ipratropium (ATROVENT HFA) 17 MCG/ACT inhaler Administer two puffs into the lungs every six hours as needed for cough, congestion    . latanoprost (XALATAN) 0.005 % ophthalmic solution Place 1 drop into both eyes 2 (two) times daily.   11  . mometasone (ASMANEX) 220 MCG/INH inhaler Administer two puffs into the lungs twice a day for COPD Rinse after use. Shake  inhaler before use    . spironolactone (ALDACTONE) 25 MG tablet Take 0.5 tablets (12.5 mg total) by mouth daily. 15 tablet 2  . SYMBICORT 160-4.5 MCG/ACT inhaler INL 2 PFS PO BID IN THE MORNING AND IN THE EVE    . traMADol (ULTRAM) 50 MG tablet Take 1 tablet (50 mg total) by mouth every 8 (eight) hours as needed. 20 tablet 0  . warfarin (COUMADIN) 7.5 MG tablet Take 1 tablet (7.5 mg total) by mouth daily. 30 tablet 2   No current facility-administered medications for this visit.     Allergies:   Patient has no known allergies.    Social History:  The patient  reports that he has been smoking. He has never used smokeless tobacco. He reports previous alcohol use. He reports previous drug use.   Family History:  The patient's ***family history includes Aneurysm in his mother.    ROS:  General:no colds or fevers, no weight changes Skin:no rashes or ulcers HEENT:no blurred vision, no congestion CV:see HPI PUL:see HPI GI:no diarrhea constipation or melena, no indigestion GU:no hematuria, no dysuria MS:no joint pain, no claudication Neuro:no syncope, no lightheadedness Endo:no diabetes, no thyroid disease Wt Readings from Last 3 Encounters:  12/10/18 138 lb (62.6 kg)  11/05/18 133 lb (60.3 kg)  11/01/18 133 lb (60.3 kg)     PHYSICAL EXAM: VS:  There were no vitals taken for this visit. , BMI There is no height or weight on file to calculate BMI. General:Pleasant affect, NAD Skin:Warm and dry, brisk capillary refill HEENT:normocephalic, sclera clear, mucus membranes moist Neck:supple, no JVD, no bruits  Heart:S1S2 RRR without murmur, gallup, rub or click Lungs:clear without rales, rhonchi, or wheezes QAS:TMHD, non tender, + BS, do not palpate liver spleen or masses Ext:no lower ext edema, 2+ pedal pulses, 2+ radial pulses Neuro:alert and oriented, MAE, follows commands, + facial symmetry    EKG:  EKG is ordered today. The ekg ordered today demonstrates ***   Recent Labs:  09/27/2018: B Natriuretic Peptide 1,225.5 09/28/2018: TSH 0.789 10/28/2018: Magnesium 1.9 11/25/2018: ALT 27; BUN 23; Creatinine 1.00; Hemoglobin 14.1; Platelet Count 347; Potassium 4.2; Sodium 143    Lipid Panel    Component Value Date/Time   CHOL 162 10/22/2018 0425   TRIG 118 10/23/2018 0323   HDL 51 10/22/2018 0425   CHOLHDL 3.2 10/22/2018 0425   VLDL 17 10/22/2018 0425   LDLCALC 94 10/22/2018 0425       Other studies Reviewed: Additional studies/ records that were reviewed today include: ***.   ASSESSMENT AND PLAN:  1.  ***   Current medicines are reviewed with the patient today.  The patient Has no concerns regarding medicines.  The following changes have been made:  See above Labs/ tests ordered today include:see above  Disposition:   FU:  see above  Signed, Cecilie Kicks, NP  12/18/2018 10:42  PM    [facility] [place], [address] [place], [place] Phone: [phone]; Fax: [phone]

## 2018-12-19 ENCOUNTER — Ambulatory Visit: Payer: Medicare HMO | Admitting: Cardiology

## 2019-01-01 ENCOUNTER — Telehealth: Payer: Self-pay | Admitting: Internal Medicine

## 2019-01-01 NOTE — Telephone Encounter (Signed)
Scheduled appt per 8/05 sch message- pt aware of appt date and time

## 2019-01-02 ENCOUNTER — Other Ambulatory Visit: Payer: Medicare HMO | Admitting: Adult Health Nurse Practitioner

## 2019-01-02 ENCOUNTER — Other Ambulatory Visit: Payer: Self-pay

## 2019-01-02 DIAGNOSIS — Z515 Encounter for palliative care: Secondary | ICD-10-CM

## 2019-01-02 NOTE — Progress Notes (Signed)
[facility] Palliative [facility] Consult Note Telephone: [phone]  Fax: [phone]  PATIENT NAME: Jared Stevens DOB: 01/04/1951 MRN: [id number]  PRIMARY CARE PROVIDER:   Sonia Side., FNP  REFERRING PROVIDER:  Sonia Side., [place],  [place] [address]  RESPONSIBLE PARTY:   Self [phone]  PASQUALE, MATTERS, daughter [phone]     RECOMMENDATIONS and PLAN:  1.  Numbness in left hand post CVA.  Endorses having numbness in left hand after his stroke.  Also has numbness in right arm, hand, and side due to a fall about 14 months ago.  Neurologist has suggested taking Vitamin B6.  States that he has been taking it for 2 months with little to no benefit. States that it may feel a slight bit better in the left hand but not on the right side. Encouraged him to continue taking it and possibly add vitamin B12 as well. Did discuss that with the injury on the right side being over a year old he may see any improvement.  States that he is able to use the right hand but has to make sure he is holding things because he cannot feel it in his hand.  Continue recommendations by neurologist.  2.  Lung cancer.  Not on active treatment.  No complaints of SOB, cough, chest pain.  Has stable DOE.  Walks 2 miles every day.  No changes in appetite or weight.    3.  Goals of care.  Patient wants to remain a full code.  He currently rents a room in a house with shared laundry and kitchen. He wishes to move into an apartment of his own but has limited funds.  Have reached out to SW to see if we have any resources he can reach out to for low income housing.  Overall patient is stable with no new complaints.  I spent 45 minutes providing this consultation,  from 10:00 to 10:45. More than 50% of the time in this consultation was spent coordinating communication.   HISTORY OF PRESENT ILLNESS:  Jared Stevens is a  68 y.o. year old male with multiple medical problems including CVA, CHF, HLD, COPD, lung cancer. Palliative Care was asked to help address goals of care.   CODE STATUS: full Code  PPS: 60% HOSPICE ELIGIBILITY/DIAGNOSIS: TBD  PHYSICAL EXAM:   General: NAD, frail appearing, thin Extremities: no edema, no joint deformities Skin: no rashes Neurological: Weakness but otherwise nonfocal; does endorse having problems remembering future appointmentsthough he remembered our appointment for today  PAST MEDICAL HISTORY:  Past Medical History:  Diagnosis Date  . Combined congestive systolic and diastolic heart failure ([place])   . COPD (chronic obstructive pulmonary disease) ([place])   . Hypercholesteremia   . Lung cancer ([place]) 03/2016    SOCIAL HX:  Social History   Tobacco Use  . Smoking status: Current Some Day Smoker  . Smokeless tobacco: Never Used  Substance Use Topics  . Alcohol use: Not Currently    ALLERGIES: No Known Allergies   PERTINENT MEDICATIONS:  Outpatient Encounter Medications as of 01/02/2019  Medication Sig  . amitriptyline (ELAVIL) 50 MG tablet Take 50 mg by mouth at bedtime.  Marland Kitchen atorvastatin (LIPITOR) 40 MG tablet Place 1 tablet (40 mg total) into feeding tube daily at 6 PM.  . carvedilol (COREG) 3.125 MG tablet Take 1 tablet (3.125 mg total) by mouth 2 (two) times daily with a meal.  . diazepam (VALIUM) 5 MG  tablet Take 1 tablet (5 mg total) by mouth every 4 (four) hours as needed. Back pain  . digoxin (LANOXIN) 0.125 MG tablet TK 1 T PO D  . furosemide (LASIX) 40 MG tablet Take 1 tablet (40 mg total) by mouth daily.  Marland Kitchen ipratropium (ATROVENT HFA) 17 MCG/ACT inhaler Administer two puffs into the lungs every six hours as needed for cough, congestion  . latanoprost (XALATAN) 0.005 % ophthalmic solution Place 1 drop into both eyes 2 (two) times daily.   . mometasone (ASMANEX) 220 MCG/INH inhaler Administer two puffs into the lungs twice a day for COPD Rinse after use.  Shake inhaler before use  . spironolactone (ALDACTONE) 25 MG tablet Take 0.5 tablets (12.5 mg total) by mouth daily.  . SYMBICORT 160-4.5 MCG/ACT inhaler INL 2 PFS PO BID IN THE MORNING AND IN THE EVE  . traMADol (ULTRAM) 50 MG tablet Take 1 tablet (50 mg total) by mouth every 8 (eight) hours as needed.  . warfarin (COUMADIN) 7.5 MG tablet Take 1 tablet (7.5 mg total) by mouth daily.   No facility-administered encounter medications on file as of 01/02/2019.       Nevae Pinnix Jenetta Downer, NP

## 2019-01-03 NOTE — Progress Notes (Signed)
Cardiology Office Note   Date:  01/08/2019   ID:  Jared Stevens, DOB 07-08-1950, MRN [id number]  PCP:  Sonia Side., FNP  Cardiologist: Dr. Mertie Moores, MD  Chief Complaint  Patient presents with  . Hospitalization Follow-up    History of Present Illness: Jared Stevens is a 68 y.o. male with a history of chronic systolic and diastolic heart failure with an EF of 15% per echocardiogram 09/27/2018, nonischemic cardiomyopathy with chronically occluded RCA with collaterals per LHC 10/01/2018, COPD, HLD, tobacco abuse, recurrent right pleural effusion and history of lung cancer.  [name] was recently hospitalized 09/27/2018 to 10/02/2018 with hypoxia found to have systolic and diastolic heart failure with an EF of 15%.  A right and left heart cath was performed with chronically occluded RCA with collaterals in which no intervention was pursued.  It was felt his heart failure was nonischemic in nature and therefore no LifeVest was prescribed.  CHF thought to be secondary to chemotherapy, hypertension and cocaine use.  He underwent a right thoracentesis on 09/27/2018 for recurrent pleural effusion with approximately 1.7 L removed.  He was discharged on ASA, statin and carvedilol.  Heart failure medications were not able to be titrated at discharge secondary to BP.  He unfortunately suffered a right MCA infarct on 10/21/2018 treated with TPA and thrombectomy with IR.  He was intubated for airway protection and BP was sustained with phenylephrine.  He was extubated 10/23/2018.  Coumadin was started 10/24/2018 for stroke prevention with ASA 325 mg as a bridge. NICM was thought to be etiology of his stroke.  He had issues with nonsustained VT prior to discharge and cardiology was asked to evaluate.  He was initially diuresed with IV Lasix which was ultimately switched to p.o. as well as the addition of spironolactone 12.5 mg, digoxin and was continued on low-dose carvedilol as tolerated given BP.   ACE/ARB/ARNI or BiDil were not initiated given low BP.  Cardiac MRI obtained as below.  He was ultimately discharged to [facility] facility however signed himself out and was last reported as living at home receiving home health therapy per neurology note on 12/10/2018.  He was noted to be tolerating his Coumadin however his INR was fluctuating.  Most recent INR 10/29/2018 was optimal at 2.2 on our records.  He was seen 12/02/2018 by palliative care in which he wanted to remain a full code.  He currently rents a room in a house with a shared laundry kitchen and wishes to move to an apartment of his own but has limited funds.  Social work has been contacted to assess for additional resources.  Per chart review, Jared Stevens has been managing patient's Coumadin per note on 11/13/2018 by our pharmacist.  Today he states that he is doing well from a cardiac perspective.  He denies anginal symptoms, shortness of breath, LE swelling, orthopnea, palpitations, dizziness or syncope.  Initially after hospital discharge, he had some issues with balance secondary to CVA however this has improved.  He is walking with a walker.  He states his INR is followed with [name] health for which she has seen on a weekly basis.  He reports medication compliance.  He lives at home by himself and completes all ADLs/IADLs without issues.  He continues to drive.  Overall he seems to be well.  We discussed follow-up echocardiogram to assess his LV function in the near future.  His BP is currently borderline for the  addition of ACE/ARB/ARNI.  May be able to add at a later date.   Past Medical History:  Diagnosis Date  . Combined congestive systolic and diastolic heart failure ([place])   . COPD (chronic obstructive pulmonary disease) ([place])   . Hypercholesteremia   . Lung cancer ([place]) 03/2016    Past Surgical History:  Procedure Laterality Date  . Arm Surgery    . HERNIA REPAIR    . IR ANGIO INTRA EXTRACRAN SEL COM  CAROTID INNOMINATE UNI L MOD SED  10/21/2018  . IR ANGIO VERTEBRAL SEL SUBCLAVIAN INNOMINATE UNI R MOD SED  10/21/2018  . IR CT HEAD LTD  10/21/2018  . IR PERCUTANEOUS ART THROMBECTOMY/INFUSION INTRACRANIAL INC DIAG ANGIO  10/21/2018  . IR THORACENTESIS ASP PLEURAL SPACE W/IMG GUIDE  09/27/2018  . RADIOLOGY WITH ANESTHESIA N/A 10/21/2018   Procedure: RADIOLOGY WITH ANESTHESIA;  Surgeon: Luanne Bras, MD;  Location: [facility];  Service: Radiology;  Laterality: N/A;  . REPLANTATION THUMB    . RIGHT/LEFT HEART CATH AND CORONARY ANGIOGRAPHY N/A 10/01/2018   Procedure: RIGHT/LEFT HEART CATH AND CORONARY ANGIOGRAPHY;  Surgeon: Troy Sine, MD;  Location: [facility];  Service: Cardiovascular;  Laterality: N/A;     Current Outpatient Medications  Medication Sig Dispense Refill  . amitriptyline (ELAVIL) 50 MG tablet Take 50 mg by mouth at bedtime.    Marland Kitchen atorvastatin (LIPITOR) 40 MG tablet Place 1 tablet (40 mg total) into feeding tube daily at 6 PM. 30 tablet 2  . carvedilol (COREG) 3.125 MG tablet Take 1 tablet (3.125 mg total) by mouth 2 (two) times daily with a meal. 60 tablet 0  . diazepam (VALIUM) 5 MG tablet Take 1 tablet (5 mg total) by mouth every 4 (four) hours as needed. Back pain 30 tablet 0  . digoxin (LANOXIN) 0.125 MG tablet TK 1 T PO D    . furosemide (LASIX) 40 MG tablet Take 1 tablet (40 mg total) by mouth daily. 30 tablet 2  . ipratropium (ATROVENT HFA) 17 MCG/ACT inhaler Administer two puffs into the lungs every six hours as needed for cough, congestion    . latanoprost (XALATAN) 0.005 % ophthalmic solution Place 1 drop into both eyes 2 (two) times daily.   11  . mometasone (ASMANEX) 220 MCG/INH inhaler Administer two puffs into the lungs twice a day for COPD Rinse after use. Shake inhaler before use    . spironolactone (ALDACTONE) 25 MG tablet Take 0.5 tablets (12.5 mg total) by mouth daily. 15 tablet 2  . SYMBICORT 160-4.5 MCG/ACT inhaler INL 2 PFS PO BID IN THE MORNING AND  IN THE EVE    . traMADol (ULTRAM) 50 MG tablet Take 1 tablet (50 mg total) by mouth every 8 (eight) hours as needed. 20 tablet 0  . warfarin (COUMADIN) 7.5 MG tablet Take 1 tablet (7.5 mg total) by mouth daily. 30 tablet 2   No current facility-administered medications for this visit.     Allergies:   Patient has no known allergies.    Social History:  The patient  reports that he has been smoking. He has never used smokeless tobacco. He reports previous alcohol use. He reports previous drug use.   Family History:  The patient's family history includes Aneurysm in his mother.    ROS:  Please see the history of present illness. Otherwise, review of systems are positive for none.   All other systems are reviewed and negative.    PHYSICAL EXAM: VS:  BP 110/72  Pulse 88   Ht 6' (1.829 m)   Wt 136 lb (61.7 kg)   SpO2 95%   BMI 18.44 kg/m  , BMI Body mass index is 18.44 kg/m.   General: Well developed, well nourished, NAD Neck: Negative for carotid bruits. No JVD Lungs: Mild wheezing in RLL. No rales or rhonchi. Breathing is unlabored. Cardiovascular: RRR with S1 S2. No murmurs, rubs, gallops, or LV heave appreciated. Extremities: No edema. No clubbing or cyanosis. DP/PT pulses 2+ bilaterally Neuro: Alert and oriented. No focal deficits. No facial asymmetry. MAE spontaneously. Psych: Responds to questions appropriately with normal affect.     EKG:  EKG is ordered today. The ekg ordered today demonstrates NSR with evidence of LVH, HR 88 bpm with no acute changes.  Inverted P waves.   Recent Labs: 09/27/2018: B Natriuretic Peptide 1,225.5 09/28/2018: TSH 0.789 10/28/2018: Magnesium 1.9 11/25/2018: ALT 27; BUN 23; Creatinine 1.00; Hemoglobin 14.1; Platelet Count 347; Potassium 4.2; Sodium 143    Lipid Panel    Component Value Date/Time   CHOL 162 10/22/2018 0425   TRIG 118 10/23/2018 0323   HDL 51 10/22/2018 0425   CHOLHDL 3.2 10/22/2018 0425   VLDL 17 10/22/2018 0425    LDLCALC 94 10/22/2018 0425     Wt Readings from Last 3 Encounters:  01/08/19 136 lb (61.7 kg)  12/10/18 138 lb (62.6 kg)  11/05/18 133 lb (60.3 kg)     Other studies Reviewed: Additional studies/ records that were reviewed today include:  Right and left heart cath 10/01/18:  Prox RCA to Dist RCA lesion is 100% stenosed.  LV end diastolic pressure is low.  There is severe left ventricular systolic dysfunction.  There is dilated left ventricle with severe LV dysfunction with EF estimate less than 15%. Low right heart pressure secondary to recent significant diuresis.  Single-vessel coronary obstructive disease with total proximal occlusion of the RCA with evidence for left to right distal collateralization. Normal large LAD and left circumflex vessels with collateralization to the distal RCA.  RECOMMENDATION: Medical therapy for severe combined LV systolic and diastolic function with potential ultimate initiation of Entresto, aldosterone blockade, diuretics as blood pressure allow. Consider of evaluation for LifeVest and subsequent ICD candidacy.   Echo 09/30/18 1. The left ventricle has a visually estimated ejection fraction of 15%. The cavity size was moderately dilated. Left ventricular diastolic Doppler parameters are consistent with impaired relaxation. 2. The right ventricle has severely reduced systolic function. The cavity was normal. There is no increase in right ventricular wall thickness. 3. Trivial pericardial effusion is present. 4. The mitral valve is abnormal. Mild thickening of the mitral valve leaflet. There is mild mitral annular calcification present. 5. The aortic valve is tricuspid. Mild thickening of the aortic valve.  Cardiac MRI 10/28/2018: FINDINGS: There was mild LAE. Normal RA. No ASD/VSD. Dilated IVC. Normal aortic root. Normal appearing AV and MV. Mild appearing MR. Small pericardial effusion. Large bilateral pleural effusions. RV is mildly  dilated and hypokinetic. The RVEF was 22% (EDV 165cc, ESV 129 cc SV 36 cc) The LV was severely dilated. There was diffuse hypokinesis worse in the inferior wall with marked anterior and inferior wall dyssynchrony The quantitative LVEF was 11% (EDV 234 cc ESV 209 cc SV 25 cc) There was no mural apical thrombus Delayed enhancement images showed full thickness inferior wall scar at mid and apical levels with small area of subendocardial scar in the apex.  IMPRESSION: 1. Severe LVE with diffuse hypokinesis worse in the  inferior wall EF 11%  2.  Mild RVE with hypokinesis RVEF 22%  3.  Small but circumferential pericardial effusion  4.  Large bilateral pleural effusions  5.  No mural apical thrombus  6. Full thickness scar involving the mid and apical inferior wall with small area of subendocardial scar at apex  7.  Mild MR  8.  Dilated IVC  9.  Marked dyssynchrony between the anterior and inferior walls  ASSESSMENT AND PLAN:  1.  Chronic systolic heart failure with severe biventricular failure due to mixed nonischemic/ischemic cardiomyopathy: -LVEF noted to be 15% per echocardiogram>>noted to be relatively  disproportionate to the level of CAD.  -Cardiac MRI obtain with an LVEF of 11% with inferior scar>> cardiomyopathy due to ischemic and nonischemic causes -Discharge weight on 10/21/2018 133lb>>>136lb today.  Appears euvolemic on exam -Continue carvedilol 3.125 mg, digoxin 0.125 mg, Lasix 40 mg daily, spironolactone 12.5 mg -Unable to add ARB/ARNI secondary to low BP -We will obtain repeat echocardiogram in 1 month and follow closely thereafter  2.  Nonsustained VT: -LifeVest discussed with EP MD prior to discharge in which neither thought he would benefit at that time -Discharged on carvedilol, digoxin -Electrolytes stable on last lab draw 11/25/2018 -BMET today -No complaints of palpitations  3.  History of stroke: -Likely in the setting of cardioembolism  secondary to severe LV dysfunction -Started on Coumadin therapy>>> follows with [facility] for INR checks -Last seen last week per patient report  4.  CAD with known total chronic occlusion of RCA with collaterals: -Per cardiac catheterization performed on 10/01/2018  -Denies anginal symptoms -Continue current regimen  5.  HLD: -Stable, last LDL 94 -Continue statin   Current medicines are reviewed at length with the patient today.  The patient does not have concerns regarding medicines.  The following changes have been made:  no change  Labs/ tests ordered today include: Echocardiogram, BMET, CBC  Orders Placed This Encounter  Procedures  . Basic metabolic panel  . CBC  . ECHOCARDIOGRAM COMPLETE    Disposition:   FU with Dr. Acie Fredrickson in 1 month  Signed, Kathyrn Drown, NP  01/08/2019 10:03 AM    [facility] [address], [place], [place]  [address] Phone: [phone]; Fax: [phone]

## 2019-01-06 ENCOUNTER — Inpatient Hospital Stay: Payer: Medicare HMO | Attending: Internal Medicine | Admitting: Internal Medicine

## 2019-01-06 DIAGNOSIS — J449 Chronic obstructive pulmonary disease, unspecified: Secondary | ICD-10-CM | POA: Insufficient documentation

## 2019-01-06 DIAGNOSIS — Z85118 Personal history of other malignant neoplasm of bronchus and lung: Secondary | ICD-10-CM | POA: Insufficient documentation

## 2019-01-06 DIAGNOSIS — I5042 Chronic combined systolic (congestive) and diastolic (congestive) heart failure: Secondary | ICD-10-CM | POA: Insufficient documentation

## 2019-01-06 DIAGNOSIS — E7800 Pure hypercholesterolemia, unspecified: Secondary | ICD-10-CM | POA: Insufficient documentation

## 2019-01-06 DIAGNOSIS — Z9221 Personal history of antineoplastic chemotherapy: Secondary | ICD-10-CM | POA: Insufficient documentation

## 2019-01-06 DIAGNOSIS — Z8673 Personal history of transient ischemic attack (TIA), and cerebral infarction without residual deficits: Secondary | ICD-10-CM | POA: Insufficient documentation

## 2019-01-06 DIAGNOSIS — Z7951 Long term (current) use of inhaled steroids: Secondary | ICD-10-CM | POA: Insufficient documentation

## 2019-01-06 DIAGNOSIS — Z7901 Long term (current) use of anticoagulants: Secondary | ICD-10-CM | POA: Insufficient documentation

## 2019-01-06 DIAGNOSIS — Z79899 Other long term (current) drug therapy: Secondary | ICD-10-CM | POA: Insufficient documentation

## 2019-01-08 ENCOUNTER — Ambulatory Visit (INDEPENDENT_AMBULATORY_CARE_PROVIDER_SITE_OTHER): Payer: Medicare HMO | Admitting: Cardiology

## 2019-01-08 ENCOUNTER — Other Ambulatory Visit: Payer: Self-pay

## 2019-01-08 ENCOUNTER — Encounter: Payer: Self-pay | Admitting: Cardiology

## 2019-01-08 VITALS — BP 110/72 | HR 88 | Ht 72.0 in | Wt 136.0 lb

## 2019-01-08 DIAGNOSIS — I10 Essential (primary) hypertension: Secondary | ICD-10-CM | POA: Diagnosis not present

## 2019-01-08 DIAGNOSIS — Z720 Tobacco use: Secondary | ICD-10-CM | POA: Diagnosis not present

## 2019-01-08 DIAGNOSIS — I5021 Acute systolic (congestive) heart failure: Secondary | ICD-10-CM

## 2019-01-08 DIAGNOSIS — I255 Ischemic cardiomyopathy: Secondary | ICD-10-CM

## 2019-01-08 DIAGNOSIS — I63411 Cerebral infarction due to embolism of right middle cerebral artery: Secondary | ICD-10-CM | POA: Diagnosis not present

## 2019-01-08 DIAGNOSIS — I428 Other cardiomyopathies: Secondary | ICD-10-CM

## 2019-01-08 LAB — BASIC METABOLIC PANEL
BUN/Creatinine Ratio: 21 (ref 10–24)
BUN: 17 mg/dL (ref 8–27)
CO2: 24 mmol/L (ref 20–29)
Calcium: 9.6 mg/dL (ref 8.6–10.2)
Chloride: 101 mmol/L (ref 96–106)
Creatinine, Ser: 0.8 mg/dL (ref 0.76–1.27)
GFR calc Af Amer: 107 mL/min/{1.73_m2} (ref >59)
GFR calc non Af Amer: 92 mL/min/{1.73_m2} (ref >59)
Glucose: 90 mg/dL (ref 65–99)
Potassium: 4.3 mmol/L (ref 3.5–5.2)
Sodium: 140 mmol/L (ref 134–144)

## 2019-01-08 LAB — CBC
Hematocrit: 42.7 % (ref 37.5–51.0)
Hemoglobin: 14.4 g/dL (ref 13.0–17.7)
MCH: 29 pg (ref 26.6–33.0)
MCHC: 33.7 g/dL (ref 31.5–35.7)
MCV: 86 fL (ref 79–97)
Platelets: 348 10*3/uL (ref 150–450)
RBC: 4.97 x10E6/uL (ref 4.14–5.80)
RDW: 15.4 % (ref 11.6–15.4)
WBC: 7.1 10*3/uL (ref 3.4–10.8)

## 2019-01-08 NOTE — Patient Instructions (Addendum)
Medication Instructions:  Your physician recommends that you continue on your current medications as directed. Please refer to the Current Medication list given to you today.  If you need a refill on your cardiac medications before your next appointment, please call your pharmacy.   Lab work: TODAY: BMET, CBC If you have labs (blood work) drawn today and your tests are completely normal, you will receive your results only by: Marland Kitchen MyChart Message (if you have MyChart) OR . A paper copy in the mail If you have any lab test that is abnormal or we need to change your treatment, we will call you to review the results.  Testing/Procedures: Your physician has requested that you have an echocardiogram. Echocardiography is a painless test that uses sound waves to create images of your heart. It provides your doctor with information about the size and shape of your heart and how well your heart's chambers and valves are working. This procedure takes approximately one hour. There are no restrictions for this procedure.   Follow-Up: At [facility], you and your health needs are our priority.  As part of our continuing mission to provide you with exceptional heart care, we have created designated Provider Care Teams.  These Care Teams include your primary Cardiologist (physician) and Advanced Practice Providers (APPs -  Physician Assistants and Nurse Practitioners) who all work together to provide you with the care you need, when you need it. You will need a follow up appointment in:  4 weeks.   You may see Mertie Moores, MD or one of the following Advanced Practice Providers on your designated Care Team: Richardson Dopp, PA-C [place], [place] . Daune Perch, NP  Any Other Special Instructions Will Be Listed Below (If Applicable).   Echocardiogram An echocardiogram is a procedure that uses painless sound waves (ultrasound) to produce an image of the heart. Images from an echocardiogram can provide  important information about:  Signs of coronary artery disease (CAD).  Aneurysm detection. An aneurysm is a weak or damaged part of an artery wall that bulges out from the normal force of blood pumping through the body.  Heart size and shape. Changes in the size or shape of the heart can be associated with certain conditions, including heart failure, aneurysm, and CAD.  Heart muscle function.  Heart valve function.  Signs of a past heart attack.  Fluid buildup around the heart.  Thickening of the heart muscle.  A tumor or infectious growth around the heart valves. Tell a health care provider about:  Any allergies you have.  All medicines you are taking, including vitamins, herbs, eye drops, creams, and over-the-counter medicines.  Any blood disorders you have.  IAny surgeries you have had.  Any medical conditions you have.  Whether you are pregnant or may be pregnant. What are the risks? Generally, this is a safe procedure. However, problems may occur, including:  Allergic reaction to dye (contrast) that may be used during the procedure. What happens before the procedure? No specific preparation is needed. You may eat and drink normally. What happens during the procedure?   An IV tube may be inserted into one of your veins.  You may receive contrast through this tube. A contrast is an injection that improves the quality of the pictures from your heart.  A gel will be applied to your chest.  A wand-like tool (transducer) will be moved over your chest. The gel will help to transmit the sound waves from the transducer.  The  sound waves will harmlessly bounce off of your heart to allow the heart images to be captured in real-time motion. The images will be recorded on a computer. The procedure may vary among health care providers and hospitals. What happens after the procedure?  You may return to your normal, everyday life, including diet, activities, and medicines,  unless your health care provider tells you not to do that. Summary  An echocardiogram is a procedure that uses painless sound waves (ultrasound) to produce an image of the heart.  Images from an echocardiogram can provide important information about the size and shape of your heart, heart muscle function, heart valve function, and fluid buildup around your heart.  You do not need to do anything to prepare before this procedure. You may eat and drink normally.  After the echocardiogram is completed, you may return to your normal, everyday life, unless your health care provider tells you not to do that. This information is not intended to replace advice given to you by your health care provider. Make sure you discuss any questions you have with your health care provider. Document Released: 05/12/2000 Document Revised: 09/05/2018 Document Reviewed: 06/17/2016 Elsevier Patient Education  2020 [employer].

## 2019-01-08 NOTE — Addendum Note (Signed)
Addended by: Jacinta Shoe on: 01/08/2019 11:38 AM   Modules accepted: Orders

## 2019-01-10 ENCOUNTER — Telehealth: Payer: Self-pay | Admitting: Internal Medicine

## 2019-01-10 ENCOUNTER — Encounter: Payer: Self-pay | Admitting: Internal Medicine

## 2019-01-10 ENCOUNTER — Other Ambulatory Visit (HOSPITAL_COMMUNITY): Payer: Medicare HMO

## 2019-01-10 NOTE — Telephone Encounter (Signed)
Confirmed 8/31 f/u with patient. Per has a few no show appointments. Per patient he used to have dedicated ride but does not have it anymore and has been using senior services. Message to transportation department.

## 2019-01-16 ENCOUNTER — Ambulatory Visit (HOSPITAL_COMMUNITY): Payer: Medicare HMO | Attending: Cardiovascular Disease

## 2019-01-16 ENCOUNTER — Other Ambulatory Visit: Payer: Self-pay

## 2019-01-16 DIAGNOSIS — I5021 Acute systolic (congestive) heart failure: Secondary | ICD-10-CM | POA: Insufficient documentation

## 2019-01-20 ENCOUNTER — Telehealth: Payer: Self-pay

## 2019-01-20 NOTE — Telephone Encounter (Signed)
-----   Message from Tommie Raymond, NP sent at 01/19/2019  8:03 AM EDT ----- Please let the patient know that her squeeze function has improved from her last echocardiogram. It is now 20-25% from 15%. We will need to continue her current regimen for now to see if we can get her heart stronger.

## 2019-01-20 NOTE — Progress Notes (Unsigned)
This encounter was created in error - please disregard.

## 2019-01-20 NOTE — Telephone Encounter (Signed)
Notes recorded by Frederik Schmidt, RN on 01/20/2019 at 1:59 PM EDT  [id number] 8/24  ------

## 2019-01-21 ENCOUNTER — Telehealth: Payer: Self-pay

## 2019-01-21 NOTE — Telephone Encounter (Signed)
Already spoke to patient this morning.  8/25

## 2019-01-21 NOTE — Telephone Encounter (Signed)
Follow UP:   Pt called and said he was left a message and was returning the call.

## 2019-01-21 NOTE — Telephone Encounter (Signed)
This encounter was created in error - please disregard.

## 2019-01-21 NOTE — Telephone Encounter (Signed)
-----   Message from Tommie Raymond, NP sent at 01/19/2019  8:03 AM EDT ----- Please let the patient know that her squeeze function has improved from her last echocardiogram. It is now 20-25% from 15%. We will need to continue her current regimen for now to see if we can get her heart stronger.

## 2019-01-21 NOTE — Telephone Encounter (Signed)
Notes recorded by Frederik Schmidt, RN on 01/21/2019 at 8:15 AM EDT  The patient has been notified of the result and verbalized understanding. All questions (if any) were answered.  Frederik Schmidt, RN 01/21/2019 8:15 AM

## 2019-01-27 ENCOUNTER — Inpatient Hospital Stay (HOSPITAL_BASED_OUTPATIENT_CLINIC_OR_DEPARTMENT_OTHER): Payer: Medicare HMO | Admitting: Internal Medicine

## 2019-01-27 ENCOUNTER — Encounter: Payer: Self-pay | Admitting: Internal Medicine

## 2019-01-27 ENCOUNTER — Other Ambulatory Visit: Payer: Self-pay

## 2019-01-27 ENCOUNTER — Telehealth: Payer: Self-pay | Admitting: Internal Medicine

## 2019-01-27 VITALS — BP 100/64 | HR 93 | Temp 98.5°F | Resp 18 | Ht 72.0 in | Wt 137.4 lb

## 2019-01-27 DIAGNOSIS — Z9221 Personal history of antineoplastic chemotherapy: Secondary | ICD-10-CM

## 2019-01-27 DIAGNOSIS — Z85118 Personal history of other malignant neoplasm of bronchus and lung: Secondary | ICD-10-CM | POA: Diagnosis present

## 2019-01-27 DIAGNOSIS — E7800 Pure hypercholesterolemia, unspecified: Secondary | ICD-10-CM | POA: Diagnosis not present

## 2019-01-27 DIAGNOSIS — Z720 Tobacco use: Secondary | ICD-10-CM

## 2019-01-27 DIAGNOSIS — C3490 Malignant neoplasm of unspecified part of unspecified bronchus or lung: Secondary | ICD-10-CM | POA: Diagnosis not present

## 2019-01-27 DIAGNOSIS — I5042 Chronic combined systolic (congestive) and diastolic (congestive) heart failure: Secondary | ICD-10-CM | POA: Diagnosis not present

## 2019-01-27 DIAGNOSIS — Z923 Personal history of irradiation: Secondary | ICD-10-CM | POA: Diagnosis not present

## 2019-01-27 DIAGNOSIS — Z7901 Long term (current) use of anticoagulants: Secondary | ICD-10-CM | POA: Diagnosis not present

## 2019-01-27 DIAGNOSIS — Z8673 Personal history of transient ischemic attack (TIA), and cerebral infarction without residual deficits: Secondary | ICD-10-CM | POA: Diagnosis not present

## 2019-01-27 DIAGNOSIS — J449 Chronic obstructive pulmonary disease, unspecified: Secondary | ICD-10-CM | POA: Diagnosis not present

## 2019-01-27 DIAGNOSIS — C3491 Malignant neoplasm of unspecified part of right bronchus or lung: Secondary | ICD-10-CM

## 2019-01-27 DIAGNOSIS — Z7951 Long term (current) use of inhaled steroids: Secondary | ICD-10-CM | POA: Diagnosis not present

## 2019-01-27 DIAGNOSIS — Z79899 Other long term (current) drug therapy: Secondary | ICD-10-CM | POA: Diagnosis not present

## 2019-01-27 NOTE — Progress Notes (Signed)
[facility] [phone]   [phone]  OFFICE PROGRESS NOTE  Sonia Side., FNP [place] [address]  DIAGNOSIS: Stage IIIA non-small cell lung cancer, adenocarcinoma   PRIOR THERAPY: status post a course of concurrent chemoradiation with weekly carboplatin and paclitaxel completed in early 2018.  The patient has been in observation since that time.  CURRENT THERAPY: Observation.  INTERVAL HISTORY: Jared Stevens 68 y.o. male returns to the clinic today for follow-up visit.  The patient is feeling fine today with no concerning complaints except for fatigue.  He was recently diagnosed with a stroke.  He denied having any current chest pain, shortness of breath, cough or hemoptysis.  He has no nausea, vomiting, diarrhea or constipation.  He has no headache or visual changes.  The patient had repeat CT scan of the chest performed last month and he is here for evaluation and discussion of his scan results.  MEDICAL HISTORY: Past Medical History:  Diagnosis Date  . Combined congestive systolic and diastolic heart failure ([place])   . COPD (chronic obstructive pulmonary disease) ([place])   . Hypercholesteremia   . Lung cancer ([place]) 03/2016    ALLERGIES:  has No Known Allergies.  MEDICATIONS:  Current Outpatient Medications  Medication Sig Dispense Refill  . amitriptyline (ELAVIL) 50 MG tablet Take 50 mg by mouth at bedtime.    Marland Kitchen atorvastatin (LIPITOR) 40 MG tablet Place 1 tablet (40 mg total) into feeding tube daily at 6 PM. 30 tablet 2  . carvedilol (COREG) 3.125 MG tablet Take 1 tablet (3.125 mg total) by mouth 2 (two) times daily with a meal. 60 tablet 0  . diazepam (VALIUM) 5 MG tablet Take 1 tablet (5 mg total) by mouth every 4 (four) hours as needed. Back pain 30 tablet 0  . digoxin (LANOXIN) 0.125 MG tablet TK 1 T PO D    . furosemide (LASIX) 40 MG tablet Take 1 tablet (40 mg total) by mouth daily. 30 tablet 2  . ipratropium  (ATROVENT HFA) 17 MCG/ACT inhaler Administer two puffs into the lungs every six hours as needed for cough, congestion    . latanoprost (XALATAN) 0.005 % ophthalmic solution Place 1 drop into both eyes 2 (two) times daily.   11  . mometasone (ASMANEX) 220 MCG/INH inhaler Administer two puffs into the lungs twice a day for COPD Rinse after use. Shake inhaler before use    . spironolactone (ALDACTONE) 25 MG tablet Take 0.5 tablets (12.5 mg total) by mouth daily. 15 tablet 2  . SYMBICORT 160-4.5 MCG/ACT inhaler INL 2 PFS PO BID IN THE MORNING AND IN THE EVE    . traMADol (ULTRAM) 50 MG tablet Take 1 tablet (50 mg total) by mouth every 8 (eight) hours as needed. 20 tablet 0  . warfarin (COUMADIN) 7.5 MG tablet Take 1 tablet (7.5 mg total) by mouth daily. 30 tablet 2   No current facility-administered medications for this visit.     SURGICAL HISTORY:  Past Surgical History:  Procedure Laterality Date  . Arm Surgery    . HERNIA REPAIR    . IR ANGIO INTRA EXTRACRAN SEL COM CAROTID INNOMINATE UNI L MOD SED  10/21/2018  . IR ANGIO VERTEBRAL SEL SUBCLAVIAN INNOMINATE UNI R MOD SED  10/21/2018  . IR CT HEAD LTD  10/21/2018  . IR PERCUTANEOUS ART THROMBECTOMY/INFUSION INTRACRANIAL INC DIAG ANGIO  10/21/2018  . IR THORACENTESIS ASP PLEURAL SPACE W/IMG GUIDE  09/27/2018  .  RADIOLOGY WITH ANESTHESIA N/A 10/21/2018   Procedure: RADIOLOGY WITH ANESTHESIA;  Surgeon: Luanne Bras, MD;  Location: [facility];  Service: Radiology;  Laterality: N/A;  . REPLANTATION THUMB    . RIGHT/LEFT HEART CATH AND CORONARY ANGIOGRAPHY N/A 10/01/2018   Procedure: RIGHT/LEFT HEART CATH AND CORONARY ANGIOGRAPHY;  Surgeon: Troy Sine, MD;  Location: [facility];  Service: Cardiovascular;  Laterality: N/A;    REVIEW OF SYSTEMS:  A comprehensive review of systems was negative except for: Constitutional: positive for fatigue Respiratory: positive for dyspnea on exertion Musculoskeletal: positive for muscle weakness    PHYSICAL EXAMINATION: General appearance: alert, cooperative, fatigued and no distress Head: Normocephalic, without obvious abnormality, atraumatic Neck: no adenopathy, no JVD, supple, symmetrical, trachea midline and thyroid not enlarged, symmetric, no tenderness/mass/nodules Lymph nodes: Cervical, supraclavicular, and axillary nodes normal. Resp: clear to auscultation bilaterally Back: symmetric, no curvature. ROM normal. No CVA tenderness. Cardio: regular rate and rhythm, S1, S2 normal, no murmur, click, rub or gallop GI: soft, non-tender; bowel sounds normal; no masses,  no organomegaly Extremities: extremities normal, atraumatic, no cyanosis or edema  ECOG PERFORMANCE STATUS: 1 - Symptomatic but completely ambulatory  Blood pressure 100/64, pulse 93, temperature 98.5 F (36.9 C), temperature source Oral, resp. rate 18, height 6' (1.829 m), weight 137 lb 6.4 oz (62.3 kg), SpO2 100 %.  LABORATORY DATA: Lab Results  Component Value Date   WBC 7.1 01/08/2019   HGB 14.4 01/08/2019   HCT 42.7 01/08/2019   MCV 86 01/08/2019   PLT 348 01/08/2019      Chemistry      Component Value Date/Time   NA 140 01/08/2019 1018   K 4.3 01/08/2019 1018   CL 101 01/08/2019 1018   CO2 24 01/08/2019 1018   BUN 17 01/08/2019 1018   CREATININE 0.80 01/08/2019 1018   CREATININE 1.00 11/25/2018 0854      Component Value Date/Time   CALCIUM 9.6 01/08/2019 1018   ALKPHOS 102 11/25/2018 0854   AST 29 11/25/2018 0854   ALT 27 11/25/2018 0854   BILITOT 0.3 11/25/2018 0854       RADIOGRAPHIC STUDIES:. Ct Chest W Contrast  Result Date: 11/27/2018 CLINICAL DATA:  RIGHT lung cancer.  Treatment completed 2018. EXAM: CT CHEST WITH CONTRAST TECHNIQUE: Multidetector CT imaging of the chest was performed during intravenous contrast administration. CONTRAST:  17mL OMNIPAQUE IOHEXOL 300 MG/ML  SOLN COMPARISON:  CT 09/05/2018 FINDINGS: Cardiovascular: No significant vascular findings. Normal heart size. No  pericardial effusion. Mediastinum/Nodes: No axillary adenopathy no supraclavicular adenopathy. No mediastinal adenopathy. No pericardial effusion. Lungs/Pleura: Along the inferior RIGHT hilum convex mass measures 5.5 x 3.2 cm compared to 6.5 by 5.0 cm for no significant change in size. There is volume loss in the RIGHT hemithorax. Band of curvilinear consolidation and bronchiectasis extends superiorly from the RIGHT hilum which si better appreciated with reduction in pleural fluid (image 64/7). In the RIGHT upper lobe, along the mediastinal border at the level of the mid SVC, a 1.8 cm flattened nodule (image 55/7) is not clearly seen on prior however the large effusion alters the orientation. Significant reduction in the RIGHT pleural effusion. Moderate small moderate effusion remains. LEFT lung is hyperexpanded.  No suspicious nodules. Upper Abdomen: Limited view of the liver, kidneys, pancreas are unremarkable. Normal adrenal glands. Musculoskeletal: No aggressive osseous lesion. IMPRESSION: 1. LEFT infrahilar mass similar to comparison exam and most consistent post treatment benign consolidation. 2. Significant reduction in RIGHT pleural effusion. 3. Band of curvilinear  atelectasis in the RIGHT lobe is more conspicuous than comparison exam but favored related to reduction in the RIGHT pleural fluid. 4. Nodule in the RIGHT upper lobe along the mediastinal border/ SVC is also more conspicuous. Recommend attention on follow-up. Electronically Signed   By: Suzy Bouchard M.D.   On: 11/27/2018 13:54   ASSESSMENT AND PLAN: This is a very pleasant 68 years old African-American male with history of a stage IIIa non-small cell lung cancer, adenocarcinoma status post a course of concurrent chemoradiation in [place] in 2017. The patient is currently in observation and he is feeling fine with no concerning complaints. He had repeat CT scan of the chest performed last months.  I personally and independently reviewed  the scan images and discussed the results with the patient today. His scan showed no concerning findings for disease progression. I recommended for the patient to continue on observation and repeat CT scan of the chest in 6 months. He was advised to call immediately if he has any concerning symptoms in the interval. The patient voices understanding of current disease status and treatment options and is in agreement with the current care plan. All questions were answered. The patient knows to call the clinic with any problems, questions or concerns. We can certainly see the patient much sooner if necessary.  I spent 10 minutes counseling the patient face to face. The total time spent in the appointment was 15 minutes.  Disclaimer: This note was dictated with voice recognition software. Similar sounding words can inadvertently be transcribed and may not be corrected upon review.

## 2019-01-27 NOTE — Telephone Encounter (Signed)
Gave avs and calendar ° °

## 2019-02-04 ENCOUNTER — Other Ambulatory Visit: Payer: Self-pay

## 2019-02-04 NOTE — Progress Notes (Signed)
Cardiology Office Note:    Date:  02/05/2019   ID:  Jared Stevens, DOB [birth year], MRN [id number]  PCP:  Sonia Side., FNP  Cardiologist:  Mertie Moores, MD   Electrophysiologist:  None  Oncologist: Dr. Julien Nordmann  Referring MD: Sonia Side., FNP   Chief Complaint  Patient presents with  . Follow-up    CAD    History of Present Illness:    Jared Stevens is a 68 y.o. 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  He returns for follow-up.  He is here alone.  Since last seen, he has done well.  He has not had significant shortness of breath.  He has not had chest pain, syncope, orthopnea, paroxysmal nocturnal dyspnea.  Prior CV studies:   The following studies were reviewed today:  Echocardiogram 01/08/2019 EF 20-25, Gr 3 DD, normal RVSF, mild RAE  Cardiac MRI 10/28/2018 IMPRESSION: 1. 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  Mild RVE with hypokinesis RVEF 22% 3.  Small but circumferential pericardial effusion 4.  Large bilateral pleural effusions 5.  No mural apical thrombus 6. Full thickness scar involving the mid and apical inferior wall with small area of subendocardial scar at apex 7.  Mild MR 8.  Dilated IVC 9.  Marked dyssynchrony between the anterior and inferior walls  R/L Cardiac catheterization 10/01/2018 RCA prox 100 (L-R collats) EF 15 RA: A 2, V 1;  mean 1 RV: 21/2 PA: 20/9; mean 14 PW: a 8 v 6; mean 7  AO: 82/65  LV 88/7/16 AO: 85/61  Oxygen saturation in the pulmonary artery 71% and in the central aorta 99%. By the [facility] method cardiac output 4.7 L/min; cardiac index 2.6 L/min/m  SVR: 15.4 PVR: 1.7 WU additional action:  Echocardiogram 09/27/2018 EF 15, Gr 1 DD, severely reduced RVSF, trivial eff, mild MAC    Past Medical History:  Diagnosis Date  . Combined congestive systolic and diastolic heart failure ([place])   . COPD (chronic obstructive pulmonary disease) ([place])   . Hypercholesteremia   . Lung cancer ([place]) 03/2016   Surgical Hx: The patient  has a past surgical history that includes Hernia repair; Replantation thumb; Arm Surgery; IR THORACENTESIS ASP PLEURAL SPACE W/IMG GUIDE (09/27/2018); RIGHT/LEFT HEART CATH AND CORONARY ANGIOGRAPHY (N/A, 10/01/2018); Radiology with anesthesia (N/A, 10/21/2018); IR PERCUTANEOUS ART THROMBECTOMY/INFUSION INTRACRANIAL INC DIAG ANGIO (10/21/2018); [place] (10/21/2018); IR ANGIO INTRA EXTRACRAN SEL COM CAROTID INNOMINATE UNI L MOD SED (10/21/2018); and IR ANGIO VERTEBRAL SEL SUBCLAVIAN INNOMINATE UNI R MOD SED (10/21/2018).   Current Medications: Current Meds  Medication Sig  . amitriptyline (ELAVIL) 50 MG tablet Take 50 mg by mouth at bedtime.  Marland Kitchen atorvastatin (LIPITOR) 40 MG tablet Place 1 tablet (40 mg total) into feeding tube daily at 6 PM.  . digoxin (LANOXIN) 0.125 MG tablet TK 1 T  PO D  . furosemide (LASIX) 40 MG tablet Take 1 tablet (40 mg total) by mouth daily.  Marland Kitchen ipratropium (ATROVENT HFA) 17 MCG/ACT inhaler Administer two puffs into the lungs every six hours as needed for cough, congestion  . latanoprost (XALATAN) 0.005 % ophthalmic solution Place 1 drop into both eyes 2 (two) times daily.   . mometasone (ASMANEX) 220 MCG/INH inhaler Administer two puffs into the lungs twice a day for COPD Rinse after use. Shake inhaler before use  . spironolactone (ALDACTONE) 25 MG tablet Take  0.5 tablets (12.5 mg total) by mouth daily.  . SYMBICORT 160-4.5 MCG/ACT inhaler INL 2 PFS PO BID IN THE MORNING AND IN THE EVE  . warfarin (COUMADIN) 7.5 MG tablet Take 1 tablet (7.5 mg total) by mouth daily.  . [DISCONTINUED] atorvastatin (LIPITOR) 20 MG tablet Take 20 mg by mouth daily.     Allergies:   Patient has no known allergies.   Social History   Tobacco Use  . Smoking status: Current Some Day Smoker  . Smokeless tobacco: Never Used  Substance Use Topics  . Alcohol use: Not Currently  . Drug use: Not Currently     Family Hx: The patient's family history includes Aneurysm in his mother.  ROS:   Please see the history of present illness.    Review of Systems  Gastrointestinal: Negative for hematochezia.  Genitourinary: Negative for hematuria.   All other systems reviewed and are negative.   EKGs/Labs/Other Test Reviewed:    EKG:  EKG is not ordered today.  The ekg ordered today demonstrates N/A  Recent Labs: 09/27/2018: B Natriuretic Peptide 1,225.5 09/28/2018: TSH 0.789 10/28/2018: Magnesium 1.9 11/25/2018: ALT 27 01/08/2019: BUN 17; Creatinine, Ser 0.80; Hemoglobin 14.4; Platelets 348; Potassium 4.3; Sodium 140   Recent Lipid Panel Lab Results  Component Value Date/Time   CHOL 162 10/22/2018 04:25 AM   TRIG 118 10/23/2018 03:23 AM   HDL 51 10/22/2018 04:25 AM   CHOLHDL 3.2 10/22/2018 04:25 AM   LDLCALC 94 10/22/2018 04:25 AM    Physical Exam:    VS:  BP 110/72   Pulse 83   Ht 6' (1.829 m)   Wt 140 lb (63.5 kg)   SpO2 99%   BMI 18.99 kg/m     Wt Readings from Last 3 Encounters:  02/05/19 140 lb (63.5 kg)  01/27/19 137 lb 6.4 oz (62.3 kg)  01/08/19 136 lb (61.7 kg)     Physical Exam  Constitutional: He is oriented to person, place, and time. He appears well-developed. He appears cachectic. No distress.  HENT:  Head: Normocephalic and atraumatic.  Eyes: No scleral icterus.  Neck: No JVD present. No thyromegaly present.  Cardiovascular: Normal  rate, regular rhythm and normal heart sounds.  No murmur heard. Pulmonary/Chest: Effort normal and breath sounds normal. He has no rales.  Abdominal: Soft. There is no hepatomegaly.  Musculoskeletal:        General: No edema.  Lymphadenopathy:    He has no cervical adenopathy.  Neurological: He is alert and oriented to person, place, and time.  Skin: Skin is warm and dry.  Psychiatric: He has a normal mood and affect.    ASSESSMENT & PLAN:    1. Chronic systolic CHF (congestive heart failure) ([place]) EF 11 by cardiac MRI June 2020.  Most recent echocardiogram in August 2020 demonstrated EF 20-25% with restrictive physiology.  His cardiomyopathy is felt to be mixed ischemic/nonischemic.  He was not felt to be a candidate for  LifeVest in the hospital.  He notes that his primary care physician took him off of carvedilol for unclear reasons couple of months ago.  He remains on digoxin, furosemide, spironolactone.  We discussed the importance of beta-blocker therapy.  He does have a history of cocaine use.  Therefore, nonselective beta-blocker should be used.  We will try to reach out to his primary care physician to find out why this medication was stopped.  I plan to resume carvedilol 3.125 mg twice daily.  I will see him back in a few weeks.  If his blood pressure will tolerate, I will try to start lisinopril 2.5 mg daily.  Arrange follow-up BMET, dig level.  2. Coronary artery disease involving native coronary artery of native heart without angina pectoris Cardiac catheterization May 2020 demonstrated a chronically occluded RCA.  He is not having anginal symptoms.  He is not on aspirin as he is on Coumadin.  Continue atorvastatin.  3. Hyperlipidemia, unspecified hyperlipidemia type Continue atorvastatin.  4. History of CVA (cerebrovascular accident) Coumadin is managed by primary care.  5. NSVT (nonsustained ventricular tachycardia) ([place]) As noted, I will try to resume beta-blocker therapy.   I will discuss further with EP whether or not he is a candidate for ICD versus CRT-D.  6. Non-small cell lung cancer, right [facility]) Continue follow-up with oncology.   Dispo:  Return in about 3 weeks (around 02/26/2019) for Routine Follow Up, w/ Richardson Dopp, PA-C.   Medication Adjustments/Labs and Tests Ordered: Current medicines are reviewed at length with the patient today.  Concerns regarding medicines are outlined above.  Tests Ordered: Orders Placed This Encounter  Procedures  . Basic Metabolic Panel (BMET)  . Digoxin level   Medication Changes: Meds ordered this encounter  Medications  . carvedilol (COREG) 3.125 MG tablet    Sig: Take 1 tablet (3.125 mg total) by mouth 2 (two) times daily.    Dispense:  180 tablet    Refill:  3    Signed, Richardson Dopp, PA-C  02/05/2019 6:03 PM    [facility] [place], [place], [place] Phone: [phone]; Fax: [phone]

## 2019-02-04 NOTE — Patient Outreach (Signed)
First attempt to obtain mRs. No answer. Left message for return call.  

## 2019-02-05 ENCOUNTER — Telehealth: Payer: Self-pay | Admitting: *Deleted

## 2019-02-05 ENCOUNTER — Other Ambulatory Visit: Payer: Self-pay

## 2019-02-05 ENCOUNTER — Ambulatory Visit (INDEPENDENT_AMBULATORY_CARE_PROVIDER_SITE_OTHER): Payer: Medicare HMO | Admitting: Physician Assistant

## 2019-02-05 ENCOUNTER — Encounter: Payer: Self-pay | Admitting: Physician Assistant

## 2019-02-05 VITALS — BP 110/72 | HR 83 | Ht 72.0 in | Wt 140.0 lb

## 2019-02-05 DIAGNOSIS — E785 Hyperlipidemia, unspecified: Secondary | ICD-10-CM | POA: Diagnosis not present

## 2019-02-05 DIAGNOSIS — I4729 Other ventricular tachycardia: Secondary | ICD-10-CM

## 2019-02-05 DIAGNOSIS — I2510 Atherosclerotic heart disease of native coronary artery without angina pectoris: Secondary | ICD-10-CM | POA: Diagnosis not present

## 2019-02-05 DIAGNOSIS — I472 Ventricular tachycardia: Secondary | ICD-10-CM

## 2019-02-05 DIAGNOSIS — I5022 Chronic systolic (congestive) heart failure: Secondary | ICD-10-CM | POA: Diagnosis not present

## 2019-02-05 DIAGNOSIS — C3491 Malignant neoplasm of unspecified part of right bronchus or lung: Secondary | ICD-10-CM

## 2019-02-05 DIAGNOSIS — Z8673 Personal history of transient ischemic attack (TIA), and cerebral infarction without residual deficits: Secondary | ICD-10-CM | POA: Diagnosis not present

## 2019-02-05 MED ORDER — CARVEDILOL 3.125 MG PO TABS: 3.1250 mg | ORAL_TABLET | Freq: Two times a day (BID) | ORAL | 3 refills | Status: DC

## 2019-02-05 NOTE — Telephone Encounter (Signed)
Jared Stevens  is seeing pt today and wanted to know why PCP, Dustin Folks, NP d/c pt's coreg.  [place] @ [phone] stated would send message and get back to [place].

## 2019-02-05 NOTE — Telephone Encounter (Signed)
error 

## 2019-02-05 NOTE — Patient Instructions (Addendum)
Medication Instructions:  Your physician has recommended you make the following change in your medication:  1. Discontinue the am dose of Lipitor (20 mg ). 2.  Continue evening dose of Lipitor one tablet (40 mg ) every evening. 3. Start Coreg one tablet (3.125 mg) twice daily.  DO NOT START THIS BACK UNTIL YOU HEAR FROM THIS OFFICE   Labwork: Your physician recommends that you return for lab work in: Wednesday, September 16 between 8-5. DO NOT TAKE DIGOXIN THE MORNING OF THIS LAB WORK. YOU MAKE TAKE DIGOXIN AFTER THIS LAB WORK.   Testing/Procedures: -None  Follow-Up: Your physician recommends that you keep your scheduled  follow-up appointment with Richardson Dopp, PA-C on  October 7 @ 9:15 am.    Any Other Special Instructions Will Be Listed Below (If Applicable).     If you need a refill on your cardiac medications before your next appointment, please call your pharmacy.

## 2019-02-06 ENCOUNTER — Other Ambulatory Visit: Payer: Self-pay

## 2019-02-06 NOTE — Telephone Encounter (Signed)
Please check with insurance to make sure it will be covered.  If PA is needed, let me know.  I will sign it. He needs Coreg for systolic CHF. After we confirm that his insurance will pay, let the patient know he can pick it up and start taking it. Of note, Carvedilol has usually been on the $4 list at [facility].  If his insurance will not cover it, see if the patient can pay out of pocket given it is generic. Richardson Dopp, PA-C    02/06/2019 8:32 PM

## 2019-02-06 NOTE — Telephone Encounter (Signed)
Per PCP  Call pt was told to stop CARVEDILOL for financial reason Ins stopped paying for it.  They will call back with more information about this medication.

## 2019-02-06 NOTE — Telephone Encounter (Signed)
I will route this to Richardson Dopp, PAC and [place]. CMA for further follow up.

## 2019-02-06 NOTE — Patient Outreach (Signed)
Second attempt to obtain mRs. No answer. Left message for return call.  

## 2019-02-10 ENCOUNTER — Other Ambulatory Visit: Payer: Self-pay

## 2019-02-10 NOTE — Telephone Encounter (Signed)
Thank you! Richardson Dopp, PA-C    02/10/2019 2:32 PM

## 2019-02-10 NOTE — Telephone Encounter (Signed)
Called pt's pharmacy, [facility], verified Coreg 3.125 bid is only $1.36 with insurance. Called pt to make him aware and he said he will pick it up and restart it.

## 2019-02-10 NOTE — Patient Outreach (Signed)
Telephone outreach to patient to obtain mRs was successfully completed. mRs= 2. 

## 2019-02-12 ENCOUNTER — Other Ambulatory Visit: Payer: Self-pay

## 2019-02-12 ENCOUNTER — Other Ambulatory Visit: Payer: Medicare HMO | Admitting: *Deleted

## 2019-02-12 DIAGNOSIS — I2510 Atherosclerotic heart disease of native coronary artery without angina pectoris: Secondary | ICD-10-CM

## 2019-02-12 DIAGNOSIS — I4729 Other ventricular tachycardia: Secondary | ICD-10-CM

## 2019-02-12 DIAGNOSIS — Z8673 Personal history of transient ischemic attack (TIA), and cerebral infarction without residual deficits: Secondary | ICD-10-CM

## 2019-02-12 DIAGNOSIS — E785 Hyperlipidemia, unspecified: Secondary | ICD-10-CM

## 2019-02-12 DIAGNOSIS — C3491 Malignant neoplasm of unspecified part of right bronchus or lung: Secondary | ICD-10-CM

## 2019-02-12 DIAGNOSIS — I472 Ventricular tachycardia: Secondary | ICD-10-CM

## 2019-02-12 DIAGNOSIS — I5022 Chronic systolic (congestive) heart failure: Secondary | ICD-10-CM

## 2019-02-12 LAB — BASIC METABOLIC PANEL
BUN/Creatinine Ratio: 18 (ref 10–24)
BUN: 19 mg/dL (ref 8–27)
CO2: 26 mmol/L (ref 20–29)
Calcium: 9.6 mg/dL (ref 8.6–10.2)
Chloride: 101 mmol/L (ref 96–106)
Creatinine, Ser: 1.04 mg/dL (ref 0.76–1.27)
GFR calc Af Amer: 85 mL/min/{1.73_m2} (ref >59)
GFR calc non Af Amer: 74 mL/min/{1.73_m2} (ref >59)
Glucose: 78 mg/dL (ref 65–99)
Potassium: 4.2 mmol/L (ref 3.5–5.2)
Sodium: 144 mmol/L (ref 134–144)

## 2019-02-12 LAB — DIGOXIN LEVEL: Digoxin, Serum: 0.6 ng/mL (ref 0.5–0.9)

## 2019-03-04 NOTE — Progress Notes (Signed)
Cardiology Office Note:    Date:  03/05/2019   ID:  Jared Stevens, DOB Nov 12, 1950, MRN [id number]  PCP:  Sonia Side., FNP  Cardiologist:  Mertie Moores, MD  Electrophysiologist:  None  Oncologist: Dr. Julien Nordmann  Referring MD: Sonia Side., FNP   Chief Complaint  Patient presents with  . Follow-up    CHF    History of Present Illness:    Jared Stevens is a 68 y.o. male with:   Chronic systolic CHF ? 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 BP limits med titration ? Echocardiogram 09/2018: EF 15 ? cMRI 10/2018: EF 11, inf scar  Coronary artery disease ? 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 Hx of Recurrent R Pleural Effusion ? 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 2/2 to non-ischemic cardiomyopathy >> Coumadin Rx (managed by [facility])  NSVT  [name] was last seen in 01/2019.  He returns for follow-up.  He is here alone.  Since last seen, he has not had chest discomfort, significant shortness of breath, orthopnea, leg swelling or syncope.  He is considering changing primary care providers.   Prior CV studies:   The following studies were reviewed today:  Echocardiogram 01/08/2019 EF 20-25, Gr 3 DD, normal RVSF, mild RAE  Cardiac MRI 10/28/2018 IMPRESSION: 1. 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 Mild RVE with hypokinesis RVEF 22% 3. Small but circumferential pericardial effusion 4. Large bilateral pleural effusions 5. No mural apical thrombus 6. Full thickness scar involving the mid and apical inferior wall with small area of subendocardial scar at apex 7. Mild MR 8. Dilated IVC 9. Marked dyssynchrony between the anterior and inferior walls  R/L Cardiac catheterization 10/01/2018 RCA prox 100 (L-R collats) EF 15 RA: A 2, V 1; mean 1 RV:  21/2 PA: 20/9; mean 14 PW: a 8 v 6; mean 7  AO: 82/65  LV 88/7/16 AO: 85/61  Oxygen saturation in the pulmonary artery 71% and in the central aorta 99%. By the [facility] method cardiac output 4.7 L/min; cardiac index 2.6 L/min/m  SVR: 15.4 PVR: 1.7 WU additional action:  Echocardiogram 09/27/2018 EF 15, Gr 1 DD, severely reduced RVSF, trivial eff, mild MAC  Past Medical History:  Diagnosis Date  . Combined congestive systolic and diastolic heart failure ([place])   . COPD (chronic obstructive pulmonary disease) ([place])   . Hypercholesteremia   . Lung cancer ([place]) 03/2016   Surgical Hx: The patient  has a past surgical history that includes Hernia repair; Replantation thumb; Arm Surgery; IR THORACENTESIS ASP PLEURAL SPACE W/IMG GUIDE (09/27/2018); RIGHT/LEFT HEART CATH AND CORONARY ANGIOGRAPHY (N/A, 10/01/2018); Radiology with anesthesia (N/A, 10/21/2018); IR PERCUTANEOUS ART THROMBECTOMY/INFUSION INTRACRANIAL INC DIAG ANGIO (10/21/2018); [place] (10/21/2018); IR ANGIO INTRA EXTRACRAN SEL COM CAROTID INNOMINATE UNI L MOD SED (10/21/2018); and IR ANGIO VERTEBRAL SEL SUBCLAVIAN INNOMINATE UNI R MOD SED (10/21/2018).   Current Medications: Current Meds  Medication Sig  . amitriptyline (ELAVIL) 50 MG tablet Take 50 mg by mouth at bedtime.  Marland Kitchen atorvastatin (LIPITOR) 40 MG tablet Place 1 tablet (40 mg total) into feeding tube daily at 6 PM.  . carvedilol (COREG) 3.125 MG tablet Take 1 tablet (3.125 mg total) by mouth 2 (two) times daily.  . digoxin (LANOXIN) 0.125 MG  tablet TK 1 T PO D  . furosemide (LASIX) 40 MG tablet Take 1 tablet (40 mg total) by mouth daily.  Marland Kitchen ipratropium (ATROVENT HFA) 17 MCG/ACT inhaler Administer two puffs into the lungs every six hours as needed for cough, congestion  . latanoprost (XALATAN) 0.005 % ophthalmic solution Place 1 drop into both eyes 2 (two) times daily.   . mometasone (ASMANEX) 220 MCG/INH inhaler Administer two puffs into the lungs twice a day for COPD  Rinse after use. Shake inhaler before use  . spironolactone (ALDACTONE) 25 MG tablet Take 0.5 tablets (12.5 mg total) by mouth daily.  . SYMBICORT 160-4.5 MCG/ACT inhaler INL 2 PFS PO BID IN THE MORNING AND IN THE EVE  . warfarin (COUMADIN) 7.5 MG tablet Take 1 tablet (7.5 mg total) by mouth daily.     Allergies:   Patient has no known allergies.   Social History   Tobacco Use  . Smoking status: Current Some Day Smoker  . Smokeless tobacco: Never Used  Substance Use Topics  . Alcohol use: Not Currently  . Drug use: Not Currently     Family Hx: The patient's family history includes Aneurysm in his mother.  ROS:   Please see the history of present illness.    ROS All other systems reviewed and are negative.   EKGs/Labs/Other Test Reviewed:    EKG:  EKG is not ordered today.  The ekg ordered today demonstrates n/a  Recent Labs: 09/27/2018: B Natriuretic Peptide 1,225.5 09/28/2018: TSH 0.789 10/28/2018: Magnesium 1.9 11/25/2018: ALT 27 01/08/2019: Hemoglobin 14.4; Platelets 348 02/12/2019: BUN 19; Creatinine, Ser 1.04; Potassium 4.2; Sodium 144   Recent Lipid Panel Lab Results  Component Value Date/Time   CHOL 162 10/22/2018 04:25 AM   TRIG 118 10/23/2018 03:23 AM   HDL 51 10/22/2018 04:25 AM   CHOLHDL 3.2 10/22/2018 04:25 AM   LDLCALC 94 10/22/2018 04:25 AM    Physical Exam:    VS:  BP 102/64   Pulse (!) 101   Ht 6' (1.829 m)   Wt 142 lb 1.9 oz (64.5 kg)   SpO2 96%   BMI 19.27 kg/m     Wt Readings from Last 3 Encounters:  03/05/19 142 lb 1.9 oz (64.5 kg)  02/05/19 140 lb (63.5 kg)  01/27/19 137 lb 6.4 oz (62.3 kg)     Physical Exam  Constitutional: He is oriented to person, place, and time. He appears well-developed and well-nourished. No distress.  HENT:  Head: Normocephalic and atraumatic.  Eyes: No scleral icterus.  Neck: No JVD present. No thyromegaly present.  Cardiovascular: Normal rate and regular rhythm.  No murmur heard. Pulmonary/Chest: Effort  normal. He has no rales.  Abdominal: Soft.  Musculoskeletal:        General: No edema.  Lymphadenopathy:    He has no cervical adenopathy.  Neurological: He is alert and oriented to person, place, and time.  Skin: Skin is warm and dry.  Psychiatric: He has a normal mood and affect.    ASSESSMENT & PLAN:    1. Chronic systolic CHF (congestive heart failure) (HCC) EF 11 by cardiac MRI in June 2020.  Most recent echocardiogram in August 2020 demonstrated EF 20-25% with restrictive physiology.  He is currently stable from a heart failure standpoint.  He is NYHA 2.  Volume status is stable on exam.  His blood pressure is too low to further titrate heart failure medications.  Continue current dose of carvedilol, digoxin, spironolactone.  I did review with  Dr. Rayann Heman who felt that the patient is not an ICD candidate.  FU with Dr. Acie Fredrickson in 3 months.  He is upset that he has not seen Dr. Acie Fredrickson.  I will make sure he is put on his schedule.    2. Coronary artery disease involving native coronary artery of native heart without angina pectoris He has a chronically occluded RCA.  He is not having angina.  He is not on aspirin as he is on Coumadin.  Continue statin therapy.  3. Hyperlipidemia, unspecified hyperlipidemia type Continue high-dose statin therapy.  4. Non-small cell lung cancer, right ([place]) Continue follow-up with oncology.  5. NSVT (nonsustained ventricular tachycardia) (HCC) Continue beta-blocker.  6. Hx of CVA He is on chronic Coumadin.  This is currently managed by primary care.  He is confused on how to manage his diet, especially as it relates to Vitamin K rich foods.  I have encouraged him to keep his diet consistent so that his INR will remain stable.  He is upset with his primary care provider and plans to change.  I have encouraged him to contact us if he would like to have his Coumadin managed at our office.   Dispo:  Return in about 3 months (around 06/05/2019) for Routine  Follow Up, w/ Dr. Acie Fredrickson, in person.   Medication Adjustments/Labs and Tests Ordered: Current medicines are reviewed at length with the patient today.  Concerns regarding medicines are outlined above.  Tests Ordered: No orders of the defined types were placed in this encounter.  Medication Changes: No orders of the defined types were placed in this encounter.   Signed, Richardson Dopp, PA-C  03/05/2019 12:00 PM    [facility] [place], [place], [place]  [address] Phone: [phone]; Fax: [phone]

## 2019-03-05 ENCOUNTER — Ambulatory Visit (INDEPENDENT_AMBULATORY_CARE_PROVIDER_SITE_OTHER): Payer: Medicare HMO | Admitting: Physician Assistant

## 2019-03-05 ENCOUNTER — Other Ambulatory Visit: Payer: Self-pay

## 2019-03-05 ENCOUNTER — Encounter: Payer: Self-pay | Admitting: Physician Assistant

## 2019-03-05 VITALS — BP 102/64 | HR 101 | Ht 72.0 in | Wt 142.1 lb

## 2019-03-05 DIAGNOSIS — I2510 Atherosclerotic heart disease of native coronary artery without angina pectoris: Secondary | ICD-10-CM | POA: Diagnosis not present

## 2019-03-05 DIAGNOSIS — C3491 Malignant neoplasm of unspecified part of right bronchus or lung: Secondary | ICD-10-CM | POA: Diagnosis not present

## 2019-03-05 DIAGNOSIS — I5022 Chronic systolic (congestive) heart failure: Secondary | ICD-10-CM | POA: Diagnosis not present

## 2019-03-05 DIAGNOSIS — I4729 Other ventricular tachycardia: Secondary | ICD-10-CM

## 2019-03-05 DIAGNOSIS — E785 Hyperlipidemia, unspecified: Secondary | ICD-10-CM | POA: Diagnosis not present

## 2019-03-05 DIAGNOSIS — Z8673 Personal history of transient ischemic attack (TIA), and cerebral infarction without residual deficits: Secondary | ICD-10-CM

## 2019-03-05 DIAGNOSIS — I472 Ventricular tachycardia: Secondary | ICD-10-CM

## 2019-03-05 NOTE — Patient Instructions (Signed)
Medication Instructions:   Your physician recommends that you continue on your current medications as directed. Please refer to the Current Medication list given to you today.  If you need a refill on your cardiac medications before your next appointment, please call your pharmacy.   Lab work:  None ordered today  Testing/Procedures:  None ordered today  Follow-Up: At [facility], you and your health needs are our priority.  As part of our continuing mission to provide you with exceptional heart care, we have created designated Provider Care Teams.  These Care Teams include your primary Cardiologist (physician) and Advanced Practice Providers (APPs -  Physician Assistants and Nurse Practitioners) who all work together to provide you with the care you need, when you need it. You will need a follow up appointment in:  3 months.  Please call our office 2 months in advance to schedule this appointment.  You may see Mertie Moores, MD or one of the following Advanced Practice Providers on your designated Care Team: Richardson Dopp, PA-C [place], [place] . Daune Perch, NP

## 2019-04-14 ENCOUNTER — Other Ambulatory Visit: Payer: Self-pay

## 2019-04-14 ENCOUNTER — Other Ambulatory Visit: Payer: Medicare HMO | Admitting: Hospice

## 2019-04-14 DIAGNOSIS — Z515 Encounter for palliative care: Secondary | ICD-10-CM

## 2019-04-14 DIAGNOSIS — J42 Unspecified chronic bronchitis: Secondary | ICD-10-CM

## 2019-04-14 NOTE — Progress Notes (Signed)
[facility] Palliative Care Consult Note Telephone: [phone]  Fax: [phone]  PATIENT NAME: Jared Stevens DOB: 04-14-1951 MRN: [id number]  PRIMARY CARE PROVIDER:   Sonia Side., FNP  REFERRING PROVIDER:  Sonia Side., FNP [place],  [place] [address]  RESPONSIBLE PARTY:   Self [phone]  Jared Stevens, daughter [phone] TELEHEALTH VISIT STATEMENT Due to the COVID-19 crisis, this visit was done via telephone from my office. It was initiated and consented to by this patient and/or family.  RECOMMENDATIONS/PLAN:   Advance Care Planning/Goals of Care: Telehealth Visit consisted of building trust and discussions on Palliative Medicine as specialized medical care for people living with serious illness, aimed at facilitating advance care plan, symptoms relief and establishing goals of care. Goals of care include to maximize quality of life and symptom management. Patient remains a full code, MOST form uploaded in [place]. Symptom management: Patient said he is in fairly good health, denied pain, chest pain/discomfort. Numbness in left extremities is ongoing, continuing to take Vit B6 with no relief. He said he added Vit B12 at a time but it only made him hyperactive but did not stop the numbness. He is no longer taking Vit B12. He is ambulatory. He said he will be seeing his Neurologist in about 3 months. COPD well managed with current breathing treatments, no recent flare. He denied swelling in his extremities. INR check today 2.3. Consider starting him on Neurontin 100 BID and increase as indicated/needed.  Follow up: Palliative care will continue to follow patient for goals of care clarification and symptom management  I spent  30  minutes providing this consultation, from 3.00pm to 3.30pm. More than 50% of the time in this consultation was spent on coordinating communication.   HISTORY OF PRESENT ILLNESS:  Jared Stevens is a 68 y.o. year old male with multiple medical problems including CVA, CHF, HLD, COPD, lung cancer. Palliative Care was asked to help address goals of care.    CODE STATUS: Full  PPS: 60% HOSPICE ELIGIBILITY/DIAGNOSIS: TBD  PAST MEDICAL HISTORY:  Past Medical History:  Diagnosis Date  . Combined congestive systolic and diastolic heart failure ([place])   . COPD (chronic obstructive pulmonary disease) ([place])   . Hypercholesteremia   . Lung cancer ([place]) 03/2016    SOCIAL HX:  Social History   Tobacco Use  . Smoking status: Current Some Day Smoker  . Smokeless tobacco: Never Used  Substance Use Topics  . Alcohol use: Not Currently    ALLERGIES: No Known Allergies   PERTINENT MEDICATIONS:  Outpatient Encounter Medications as of 04/14/2019  Medication Sig  . amitriptyline (ELAVIL) 50 MG tablet Take 50 mg by mouth at bedtime.  Marland Kitchen atorvastatin (LIPITOR) 40 MG tablet Place 1 tablet (40 mg total) into feeding tube daily at 6 PM.  . carvedilol (COREG) 3.125 MG tablet Take 1 tablet (3.125 mg total) by mouth 2 (two) times daily.  . digoxin (LANOXIN) 0.125 MG tablet TK 1 T PO D  . furosemide (LASIX) 40 MG tablet Take 1 tablet (40 mg total) by mouth daily.  Marland Kitchen ipratropium (ATROVENT HFA) 17 MCG/ACT inhaler Administer two puffs into the lungs every six hours as needed for cough, congestion  . latanoprost (XALATAN) 0.005 % ophthalmic solution Place 1 drop into both eyes 2 (two) times daily.   . mometasone (ASMANEX) 220 MCG/INH inhaler Administer two puffs into the lungs twice a day for COPD Rinse after use. Shake inhaler before use  .  spironolactone (ALDACTONE) 25 MG tablet Take 0.5 tablets (12.5 mg total) by mouth daily.  . SYMBICORT 160-4.5 MCG/ACT inhaler INL 2 PFS PO BID IN THE MORNING AND IN THE EVE  . warfarin (COUMADIN) 7.5 MG tablet Take 1 tablet (7.5 mg total) by mouth daily.   No facility-administered encounter medications on file as of  04/14/2019.     PHYSICAL EXAM:   Teodoro Spray, NP

## 2019-05-26 ENCOUNTER — Other Ambulatory Visit: Payer: Self-pay

## 2019-05-26 ENCOUNTER — Other Ambulatory Visit: Payer: Medicare HMO | Admitting: Hospice

## 2019-05-26 DIAGNOSIS — J42 Unspecified chronic bronchitis: Secondary | ICD-10-CM

## 2019-05-26 DIAGNOSIS — Z515 Encounter for palliative care: Secondary | ICD-10-CM

## 2019-05-26 NOTE — Progress Notes (Signed)
[facility] Palliative [facility] Consult Note Telephone: [phone]  Fax: [phone]  PATIENT NAME: Jared Stevens DOB: 02-12-1951 MRN: [id number]  PRIMARY CARE PROVIDER:   Sonia Side., FNP  REFERRING PROVIDER:  Sonia Side., [place],  [place] [address]  RESPONSIBLE PARTY:Self [phone]  Jared Stevens, Jared Stevens, daughter [phone] TELEHEALTH VISIT STATEMENT Due to the COVID-19 crisis, this visit was done via telephone from my office. It was initiated and consented to by this patient and/or family.  RECOMMENDATIONS/PLAN:   Advance Care Planning/Goals of Care: Telehealth Visit consisted of building trust and checking up on patient's status. No acute changes;  patient continues to go for his weekly INR check in [employer] office.  Goals of care include to maximize quality of life and symptom management. Patient remains a full code, MOST form uploaded in [place]. Symptom management: Patient affirmed he is in fairly good health, denied pain, chest pain/discomfort. No respiratory distress, no recent COPD flare.  Numbness in left upper extremities is ongoing, continuing to take Vit B6 with no relief. He said he would start back on his Vit B12 but will take it in the morning because it usually made him more active/alert.  Asked him if he sees a Garment/textile technologist. Patient said he plans to make appointment to see his Neurologist  for the ongoing numbness in his left upper extremity. He is ambulatory;  he continues to see Jared Stevens for his Lung CA which is currently in remission; scheduled to see Jared Stevens in March next year  Follow up: Palliative care will continue to follow patient for goals of care clarification and symptom management I spent  30  minutes providing this consultation, from 2.00pm to 2.30pm. More than 50% of the time in this consultation was spent on coordinating  communication.  HISTORY OF PRESENT ILLNESS:Jared Atkinsis a 68 y.o.year oldmalewith multiple medical problems including CVA, CHF, HLD, COPD, lung cancer. Palliative Care was asked to help address goals of care.  CODE STATUS: Full  PPS: 60% HOSPICE ELIGIBILITY/DIAGNOSIS: TBD  PAST MEDICAL HISTORY:  Past Medical History:  Diagnosis Date  . Combined congestive systolic and diastolic heart failure ([place])   . COPD (chronic obstructive pulmonary disease) ([place])   . Hypercholesteremia   . Lung cancer ([place]) 03/2016    SOCIAL HX:  Social History   Tobacco Use  . Smoking status: Current Some Day Smoker  . Smokeless tobacco: Never Used  Substance Use Topics  . Alcohol use: Not Currently    ALLERGIES: No Known Allergies   PERTINENT MEDICATIONS:  Outpatient Encounter Medications as of 05/26/2019  Medication Sig  . amitriptyline (ELAVIL) 50 MG tablet Take 50 mg by mouth at bedtime.  Marland Kitchen atorvastatin (LIPITOR) 40 MG tablet Place 1 tablet (40 mg total) into feeding tube daily at 6 PM.  . carvedilol (COREG) 3.125 MG tablet Take 1 tablet (3.125 mg total) by mouth 2 (two) times daily.  . digoxin (LANOXIN) 0.125 MG tablet TK 1 T PO D  . furosemide (LASIX) 40 MG tablet Take 1 tablet (40 mg total) by mouth daily.  Marland Kitchen ipratropium (ATROVENT HFA) 17 MCG/ACT inhaler Administer two puffs into the lungs every six hours as needed for cough, congestion  . latanoprost (XALATAN) 0.005 % ophthalmic solution Place 1 drop into both eyes 2 (two) times daily.   . mometasone (ASMANEX) 220 MCG/INH inhaler Administer two puffs into the lungs twice a day for COPD Rinse after use. Shake inhaler before use  .  spironolactone (ALDACTONE) 25 MG tablet Take 0.5 tablets (12.5 mg total) by mouth daily.  . SYMBICORT 160-4.5 MCG/ACT inhaler INL 2 PFS PO BID IN THE MORNING AND IN THE EVE  . warfarin (COUMADIN) 7.5 MG tablet Take 1 tablet (7.5 mg total) by mouth daily.   No facility-administered encounter medications on  file as of 05/26/2019.    Jared Spray, NP

## 2019-06-09 NOTE — Progress Notes (Deleted)
Cardiology Office Note:    Date:  06/09/2019   ID:  Jared Stevens, DOB 04-13-1951, MRN [id number]  PCP:  Sonia Side., FNP  Cardiologist:  Mertie Moores, MD *** Electrophysiologist:  None  Oncologist: Dr. Julien Nordmann  Referring MD: Sonia Side., FNP   No chief complaint on file. ***  History of Present Illness:    Jared Stevens is a 69 y.o. male with:   Chronic systolic CHF ? 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 BP limits med titration ? Echocardiogram 09/2018: EF 15 ? cMRI 10/2018: EF 11, inf scar  Coronary artery disease ? 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 Hx of Recurrent R Pleural Effusion ? 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 2/2 to non-ischemic cardiomyopathy >> Coumadin Rx (managed by [facility])  NSVT   Jared Stevens was last seen in 02/2019.    The DICTATELATER SmartLink is not supported in this context. ***   Prior CV studies:   The following studies were reviewed today:  *** Echocardiogram 01/08/2019 EF 20-25, Gr 3 DD, normal RVSF, mild RAE  Cardiac MRI 10/28/2018 IMPRESSION: 1. 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 Mild RVE with hypokinesis RVEF 22% 3. Small but circumferential pericardial effusion 4. Large bilateral pleural effusions 5. No mural apical thrombus 6. Full thickness scar involving the mid and apical inferior wall with small area of subendocardial scar at apex 7. Mild MR 8. Dilated IVC 9. Marked dyssynchrony between the anterior and inferior walls  R/L Cardiac catheterization 10/01/2018 RCA prox 100 (L-R collats) EF 15 RA: A 2, V 1; mean 1 RV: 21/2 PA: 20/9; mean 14 PW: a 8 v 6; mean 7  AO: 82/65  LV 88/7/16 AO: 85/61  Oxygen saturation in the pulmonary artery 71% and in the central aorta 99%. By  the [facility] method cardiac output 4.7 L/min; cardiac index 2.6 L/min/m  SVR: 15.4 PVR: 1.7 WU additional action:  Echocardiogram 09/27/2018 EF 15, Gr 1 DD, severely reduced RVSF, trivial eff, mild MAC   Past Medical History:  Diagnosis Date  . Combined congestive systolic and diastolic heart failure ([place])   . COPD (chronic obstructive pulmonary disease) ([place])   . Hypercholesteremia   . Lung cancer ([place]) 03/2016   Surgical Hx: The patient  has a past surgical history that includes Hernia repair; Replantation thumb; Arm Surgery; IR THORACENTESIS ASP PLEURAL SPACE W/IMG GUIDE (09/27/2018); RIGHT/LEFT HEART CATH AND CORONARY ANGIOGRAPHY (N/A, 10/01/2018); Radiology with anesthesia (N/A, 10/21/2018); IR PERCUTANEOUS ART THROMBECTOMY/INFUSION INTRACRANIAL INC DIAG ANGIO (10/21/2018); [place] (10/21/2018); IR ANGIO INTRA EXTRACRAN SEL COM CAROTID INNOMINATE UNI L MOD SED (10/21/2018); and IR ANGIO VERTEBRAL SEL SUBCLAVIAN INNOMINATE UNI R MOD SED (10/21/2018).   Current Medications: No outpatient medications have been marked as taking for the 06/10/19 encounter (Appointment) with Richardson Dopp T, PA-C.     Allergies:   Patient has no known allergies.   Social History   Tobacco Use  . Smoking status: Current Some Day Smoker  . Smokeless tobacco: Never Used  Substance Use Topics  . Alcohol use: Not Currently  . Drug use: Not Currently     Family Hx: The patient's family history includes Aneurysm in his mother.  ROS:   Please see the history of present illness.  ROS All other systems reviewed and are negative.   EKGs/Labs/Other Test Reviewed:    EKG:  EKG is *** ordered today.  The ekg ordered today demonstrates ***  Recent Labs: 09/27/2018: B Natriuretic Peptide 1,225.5 09/28/2018: TSH 0.789 10/28/2018: Magnesium 1.9 11/25/2018: ALT 27 01/08/2019: Hemoglobin 14.4; Platelets 348 02/12/2019: BUN 19; Creatinine, Ser 1.04; Potassium 4.2; Sodium 144   Recent Lipid Panel Lab Results    Component Value Date/Time   CHOL 162 10/22/2018 04:25 AM   TRIG 118 10/23/2018 03:23 AM   HDL 51 10/22/2018 04:25 AM   CHOLHDL 3.2 10/22/2018 04:25 AM   LDLCALC 94 10/22/2018 04:25 AM    Physical Exam:    VS:  There were no vitals taken for this visit.    Wt Readings from Last 3 Encounters:  03/05/19 142 lb 1.9 oz (64.5 kg)  02/05/19 140 lb (63.5 kg)  01/27/19 137 lb 6.4 oz (62.3 kg)     ***Physical Exam  ASSESSMENT & PLAN:    *** 1. Chronic systolic CHF (congestive heart failure) (HCC) EF 11 by cardiac MRI in June 2020.  Most recent echocardiogram in August 2020 demonstrated EF 20-25% with restrictive physiology.  He is currently stable from a heart failure standpoint.  He is NYHA 2.  Volume status is stable on exam.  His blood pressure is too low to further titrate heart failure medications.  Continue current dose of carvedilol, digoxin, spironolactone.  I did review with Dr. Rayann Heman who felt that the patient is not an ICD candidate.  FU with Dr. Acie Fredrickson in 3 months.  He is upset that he has not seen Dr. Acie Fredrickson.  I will make sure he is put on his schedule.    2. Coronary artery disease involving native coronary artery of native heart without angina pectoris He has a chronically occluded RCA.  He is not having angina.  He is not on aspirin as he is on Coumadin.  Continue statin therapy.  3. Hyperlipidemia, unspecified hyperlipidemia type Continue high-dose statin therapy.  4. Non-small cell lung cancer, right ([place]) Continue follow-up with oncology.  5. NSVT (nonsustained ventricular tachycardia) (HCC) Continue beta-blocker.  6. Hx of CVA He is on chronic Coumadin.  This is currently managed by primary care.  He is confused on how to manage his diet, especially as it relates to Vitamin K rich foods.  I have encouraged him to keep his diet consistent so that his INR will remain stable.  He is upset with his primary care provider and plans to change.  I have encouraged him  to contact us if he would like to have his Coumadin managed at our office.   Dispo:  No follow-ups on file.   Medication Adjustments/Labs and Tests Ordered: Current medicines are reviewed at length with the patient today.  Concerns regarding medicines are outlined above.  Tests Ordered: No orders of the defined types were placed in this encounter.  Medication Changes: No orders of the defined types were placed in this encounter.   Signed, Richardson Dopp, PA-C  06/09/2019 12:29 PM    [facility] [place], [place], [place]  [address] Phone: [phone]; Fax: [phone]

## 2019-06-10 ENCOUNTER — Ambulatory Visit: Payer: Medicare HMO | Admitting: Physician Assistant

## 2019-06-12 ENCOUNTER — Ambulatory Visit: Payer: Medicare HMO | Admitting: Cardiovascular Disease

## 2019-06-26 ENCOUNTER — Encounter: Payer: Self-pay | Admitting: Neurology

## 2019-06-26 ENCOUNTER — Ambulatory Visit (INDEPENDENT_AMBULATORY_CARE_PROVIDER_SITE_OTHER): Payer: Medicare HMO | Admitting: Neurology

## 2019-06-26 ENCOUNTER — Other Ambulatory Visit: Payer: Self-pay

## 2019-06-26 VITALS — BP 100/63 | HR 94 | Temp 97.0°F | Ht 73.0 in | Wt 142.0 lb

## 2019-06-26 DIAGNOSIS — I6990 Unspecified sequelae of unspecified cerebrovascular disease: Secondary | ICD-10-CM

## 2019-06-26 NOTE — Patient Instructions (Signed)
I had a long d/w patient about his remote stroke,cardiomyopathy, risk for recurrent stroke/TIAs, personally independently reviewed imaging studies and stroke evaluation results and answered questions.Continue warfarin daily  for secondary stroke prevention and maintain strict control of hypertension with blood pressure goal below 130/90, diabetes with hemoglobin A1c goal below 6.5% and lipids with LDL cholesterol goal below 70 mg/dL. I also advised the patient to eat a healthy diet with plenty of whole grains, cereals, fruits and vegetables, exercise regularly and maintain ideal body weight. Followup in the future with  Cardiology next month and with my nurse practitioner Janett Billow in 1 year or call earlier if necessary

## 2019-06-26 NOTE — Progress Notes (Signed)
[facility] [address] [place]. [place] [address] [phone]       OFFICE FOLLOW-UP NOTE  Mr. Badr Piedra Date of Birth:  Dec 21, 1950 Medical Record Number:  [id number]   HPI: Mr. Khader is a 69 year old African-American male seen today for initial office follow-up visit following hospital admission for stroke in May 2020.  History is obtained from the patient and review of electronic medical records.  I personally reviewed imaging films in PACS.Mr.Brownie Atkinsis a 69 y.o.malewith history of ongoing tobacco use, COPD, CHF, cardiomyopathy (EF 15%), lung cancer, hx of substance abuse, and Hldpresenting with acute onset of left sided weakness with hemineglect, left facial droop, confusion and dysphasia after falling in bathroom. CT scan of the head on admission showed no acute pathology.  Patient met criteria for IV TPA which was administered uneventfully.  CT angiogram showed acute thrombus occluding the distal right M1 segment and extending into the M2 branches.  CT perfusion showed significant penumbra.  Patient was taken for emergent mechanical thrombectomy which was performed successfully withTICI 2b reperfusion achieved by Dr. Estanislado Pandy.  Patient was admitted to the intensive care unit and had close neurological monitoring and strict blood pressure control and did well.  MRI scan subsequently showed patchy right MCA infarct involving temporal lobes, right insula, basal ganglia and frontal lobe.  2D echo showed reduced ejection fraction of 15% but no definite clot.  LDL cholesterol was 94 mg percent and hemoglobin A1c was 5.7.  Urine drug screen was positive only for benzodiazepines.  Patient had been on aspirin which was switch to warfarin for anticoagulation.  He was transferred for rehabilitation to skilled nursing facility but patient signed himself out and his living at home and getting home therapy.  He states he is doing well.  He can walk about 1 to 2 miles.  He  uses a cane for outdoors but can walk indoors without it.  He states that his left leg drags a little bit but he can catch himself and avoid falls and injuries.  He is also noticed some decreased fine motor skills in his left hand.  He is tolerating warfarin well but his INR does fluctuate.  His last INR that I can find in the system was in 10/29/2018 and was optimal at 2.2.  Denies significant bleeding though he does bruise easily.  He has not had seen his cancer doctor for follow-up and is missed a recent appointment due to the [place] situation.  He has quit smoking. Update 06/26/2019 : He returns for follow-up after last visit 6 months ago.  He is doing well from stroke standpoint without recurrent stroke or TIA symptoms.  He remains on warfarin and his INR has been fairly steady between 2 and 3.  He denies significant bleeding or bruising.  Continues to have some numbness and diminished fine motor skills in his hands and struggles while wearing his clothes.  Continues to walk long distances with a cane.  He feels his balance is still off.  He is an only one minor fall but no major injuries.  He had a follow-up echocardiogram done in August 2020 which showed improvement in his ejection fraction to now 20%.  He does have a follow-up appointment with his cardiologist next month.  He has no new complaints.  His blood pressure is well controlled and it is 100/63 today he is tolerating Lipitor well without any side effects ROS:   14 system review of systems is positive for leg weakness,  gait difficulty, fatigue, tiredness, balance problems and all other systems negative  PMH:  Past Medical History:  Diagnosis Date  . Combined congestive systolic and diastolic heart failure ([place])   . COPD (chronic obstructive pulmonary disease) ([place])   . Hypercholesteremia   . Lung cancer ([place]) 03/2016  . Stroke [facility])     Social History:  Social History   Socioeconomic History  . Marital status: Single    Spouse name:  Not on file  . Number of children: Not on file  . Years of education: Not on file  . Highest education level: Not on file  Occupational History  . Not on file  Tobacco Use  . Smoking status: Current Some Day Smoker  . Smokeless tobacco: Never Used  Substance and Sexual Activity  . Alcohol use: Not Currently  . Drug use: Not Currently  . Sexual activity: Not on file  Other Topics Concern  . Not on file  Social History Narrative  . Not on file   Social Determinants of Health   Financial Resource Strain:   . Difficulty of Paying Living Expenses: Not on file  Food Insecurity:   . Worried About Charity fundraiser in the Last Year: Not on file  . Ran Out of Food in the Last Year: Not on file  Transportation Needs: Unmet Transportation Needs  . Lack of Transportation (Medical): Yes  . Lack of Transportation (Non-Medical): Yes  Physical Activity:   . Days of Exercise per Week: Not on file  . Minutes of Exercise per Session: Not on file  Stress:   . Feeling of Stress : Not on file  Social Connections:   . Frequency of Communication with Friends and Family: Not on file  . Frequency of Social Gatherings with Friends and Family: Not on file  . Attends Religious Services: Not on file  . Active Member of Clubs or Organizations: Not on file  . Attends Archivist Meetings: Not on file  . Marital Status: Not on file  Intimate Partner Violence:   . Fear of Current or Ex-Partner: Not on file  . Emotionally Abused: Not on file  . Physically Abused: Not on file  . Sexually Abused: Not on file    Medications:   Current Outpatient Medications on File Prior to Visit  Medication Sig Dispense Refill  . amitriptyline (ELAVIL) 50 MG tablet Take 50 mg by mouth at bedtime.    Marland Kitchen atorvastatin (LIPITOR) 20 MG tablet     . atorvastatin (LIPITOR) 40 MG tablet Place 1 tablet (40 mg total) into feeding tube daily at 6 PM. 30 tablet 2  . carvedilol (COREG) 3.125 MG tablet Take 1 tablet  (3.125 mg total) by mouth 2 (two) times daily. 180 tablet 3  . digoxin (LANOXIN) 0.125 MG tablet TK 1 T PO D    . furosemide (LASIX) 40 MG tablet Take 1 tablet (40 mg total) by mouth daily. 30 tablet 2  . ipratropium (ATROVENT HFA) 17 MCG/ACT inhaler Administer two puffs into the lungs every six hours as needed for cough, congestion    . latanoprost (XALATAN) 0.005 % ophthalmic solution Place 1 drop into both eyes 2 (two) times daily.   11  . mometasone (ASMANEX) 220 MCG/INH inhaler Administer two puffs into the lungs twice a day for COPD Rinse after use. Shake inhaler before use    . spironolactone (ALDACTONE) 25 MG tablet Take 0.5 tablets (12.5 mg total) by mouth daily. 15 tablet 2  .  SYMBICORT 160-4.5 MCG/ACT inhaler INL 2 PFS PO BID IN THE MORNING AND IN THE EVE    . warfarin (COUMADIN) 7.5 MG tablet Take 1 tablet (7.5 mg total) by mouth daily. 30 tablet 2   No current facility-administered medications on file prior to visit.    Allergies:  No Known Allergies  Physical Exam General: Frail middle-aged African-American male, seated, in no evident distress Head: head normocephalic and atraumatic.  Neck: supple with no carotid or supraclavicular bruits Cardiovascular: regular rate and rhythm, no murmurs Musculoskeletal: no deformity Skin:  no rash/petichiae Vascular:  Normal pulses all extremities Vitals:   06/26/19 0833  BP: 100/63  Pulse: 94  Temp: (!) 97 F (36.1 C)   Neurologic Exam Mental Status: Awake and fully alert. Oriented to place and time. Recent and remote memory intact. Attention span, concentration and fund of knowledge appropriate. Mood and affect appropriate.  Cranial Nerves: Fundoscopic exam reveals sharp disc margins. Pupils equal, briskly reactive to light. Extraocular movements full without nystagmus. Visual fields full to confrontation. Hearing intact. Facial sensation intact. Face, tongue, palate moves normally and symmetrically.  Motor: Normal bulk and tone.  Normal strength in all tested extremity muscles.  Diminished fine finger movements on the left.  Orbits right over left upper extremity.  Increased tone in the left leg. Sensory.: intact to touch ,pinprick .position and vibratory sensation.  Coordination: Rapid alternating movements normal in all extremities. Finger-to-nose and heel-to-shin performed accurately bilaterally. Gait and Station: Arises from chair without difficulty. Stance is normal. Gait demonstrates normal stride length and balance but drags the left leg slightly..  Unable to tandem walk without difficulty.  Reflexes: 1+ and symmetric. Toes downgoing.   NIHSS  0 Modified Rankin  2   ASSESSMENT: 69 year old African-American male with embolic right MCA stroke in May 2020 treated with IV TPA followed by successful mechanical thrombectomy from cardiogenic embolism from ischemic cardiomyopathy.  He is done remarkably well with hardly any residual physical deficits.  Vascular risk factors of hypercoagulability from adenocarcinoma of the lung, cardiomyopathy with low ejection fraction, hyper lipidemia., and chronic smoking    PLAN: I had a long d/w patient about his remote stroke,cardiomyopathy, risk for recurrent stroke/TIAs, personally independently reviewed imaging studies and stroke evaluation results and answered questions.Continue warfarin daily  for secondary stroke prevention and maintain strict control of hypertension with blood pressure goal below 130/90, diabetes with hemoglobin A1c goal below 6.5% and lipids with LDL cholesterol goal below 70 mg/dL. I also advised the patient to eat a healthy diet with plenty of whole grains, cereals, fruits and vegetables, exercise regularly and maintain ideal body weight. Followup in the future with  Cardiology next month and with my nurse practitioner Janett Billow in 1 year or call earlier if necessary Greater than 50% of time during this 25 minute visit was spent on counseling,explanation of  diagnosis embolic stroke, mechanical thrombectomy, planning of further management, discussion with patient and family and coordination of care Antony Contras, MD  [facility] [address] [place], [place] [address]  Phone [phone] Fax [phone] Note: This document was prepared with digital dictation and possible smart phrase technology. Any transcriptional errors that result from this process are unintentional

## 2019-06-30 NOTE — Progress Notes (Signed)
Cardiology Office Note:    Date:  07/01/2019   ID:  Jared Stevens, DOB 1950-06-21, MRN [id number]  PCP:  Sonia Side., FNP  Cardiologist:  Mertie Moores, MD   Electrophysiologist:  None   Referring MD: Sonia Side., FNP   Chief Complaint:  Follow-up (CHF)    Patient Profile:    Jared Stevens is a 69 y.o. male with:   Chronic systolic CHF ? 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 BP limits med titration ? Echocardiogram 09/2018: EF 15 ? cMRI 10/2018: EF 11, inf scar ? Echo 12/2018: EF 20-25, Gr 3 DD  Coronary artery disease ? 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 Hx of Recurrent R Pleural Effusion ? 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 2/2 to non-ischemic cardiomyopathy >> Coumadin Rx (managed by [facility])  NSVT  Prior CV studies: Echocardiogram 01/08/2019 EF 20-25, Gr 3 DD, normal RVSF, mild RAE  Cardiac MRI 10/28/2018 IMPRESSION: 1. 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 Mild RVE with hypokinesis RVEF 22% 3. Small but circumferential pericardial effusion 4. Large bilateral pleural effusions 5. No mural apical thrombus 6. Full thickness scar involving the mid and apical inferior wall with small area of subendocardial scar at apex 7. Mild MR 8. Dilated IVC 9. Marked dyssynchrony between the anterior and inferior walls  R/L Cardiac catheterization 10/01/2018 RCA prox 100 (L-R collats) EF 15  Echocardiogram 09/27/2018 EF 15, Gr 1 DD, severely reduced RVSF, trivial eff, mild MAC  History of Present Illness:    [name] was last seen in 02/2019.  He returns for follow-up.  Overall, he has been doing well.  He has not had significant shortness of breath.  He has not had chest pain, orthopnea, syncope, leg swelling.  He has not had  significant cough.  Past Medical History:  Diagnosis Date  . Chronic obstructive pulmonary disease   . Combined systolic and diastolic CHF    Mixed Ischemic/Non-Ischemic CM // Echo 09/2018: EF 15 // cMRI 10/2018: EF 11, inf scar // Echo 12/2018: EF 20-25, Gr 3 DD // Not a candidate for ICD due to comorbid illnesses   . Coronary artery disease    cath 09/2018: RCA chronically occluded >> Med Rx  . 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 Hx of recurrent R pleural effusion    s/p thoracentesis  . Hypercholesteremia   . Lung cancer ([place]) 03/2016  . Mixed Ischemic and Non-Ischemic Cardiomyopathy   . Non-small cell carcinoma of lung   . Nonsustained ventricular tachycardia     Current Medications: Current Meds  Medication Sig  . amitriptyline (ELAVIL) 50 MG tablet Take 50 mg by mouth at bedtime.  Marland Kitchen atorvastatin (LIPITOR) 20 MG tablet   . atorvastatin (LIPITOR) 40 MG tablet Place 1 tablet (40 mg total) into feeding tube daily at 6 PM.  . carvedilol (COREG) 3.125 MG tablet Take 1 tablet (3.125 mg total) by mouth 2 (two) times daily.  . digoxin (LANOXIN) 0.125 MG tablet TK 1 T PO D  . furosemide (LASIX) 40 MG tablet Take 1 tablet (40 mg total) by mouth daily.  Marland Kitchen ipratropium (ATROVENT HFA) 17 MCG/ACT inhaler Administer two puffs into the lungs  every six hours as needed for cough, congestion  . latanoprost (XALATAN) 0.005 % ophthalmic solution Place 1 drop into both eyes 2 (two) times daily.   . mometasone (ASMANEX) 220 MCG/INH inhaler Administer two puffs into the lungs twice a day for COPD Rinse after use. Shake inhaler before use  . spironolactone (ALDACTONE) 25 MG tablet Take 0.5 tablets (12.5 mg total) by mouth daily.  . SYMBICORT 160-4.5 MCG/ACT inhaler INL 2 PFS PO BID IN THE MORNING AND IN THE EVE  . warfarin (COUMADIN) 7.5 MG tablet Take 1 tablet (7.5 mg total) by mouth daily.     Allergies:   Patient has no known allergies.   Social  History   Tobacco Use  . Smoking status: Current Some Day Smoker  . Smokeless tobacco: Never Used  Substance Use Topics  . Alcohol use: Not Currently  . Drug use: Not Currently     Family Hx: The patient's family history includes Aneurysm in his mother.  ROS   EKGs/Labs/Other Test Reviewed:    EKG:  EKG is ordered today.  The ekg ordered today demonstrates normal sinus rhythm, heart rate 74, normal axis, inferior Q waves, T wave inversions 2, 3, aVF, V4-V6, QTC 437, LVH  Recent Labs: 09/27/2018: B Natriuretic Peptide 1,225.5 09/28/2018: TSH 0.789 10/28/2018: Magnesium 1.9 11/25/2018: ALT 27 01/08/2019: Hemoglobin 14.4; Platelets 348 02/12/2019: BUN 19; Creatinine, Ser 1.04; Potassium 4.2; Sodium 144   Recent Lipid Panel Lab Results  Component Value Date/Time   CHOL 162 10/22/2018 04:25 AM   TRIG 118 10/23/2018 03:23 AM   HDL 51 10/22/2018 04:25 AM   CHOLHDL 3.2 10/22/2018 04:25 AM   LDLCALC 94 10/22/2018 04:25 AM    Physical Exam:    VS:  BP 100/64   Pulse 74   Ht _0  (1.854 m)   Wt 142 lb 6.4 oz (64.6 kg)   SpO2 95%   BMI 18.79 kg/m     Wt Readings from Last 3 Encounters:  07/01/19 142 lb 6.4 oz (64.6 kg)  06/26/19 142 lb (64.4 kg)  03/05/19 142 lb 1.9 oz (64.5 kg)     Constitutional:      Appearance: Not in distress. Cachectic.  Neck:     Thyroid: Thyroid normal.     Vascular: JVD normal.  Pulmonary:     Effort: Pulmonary effort is normal.     Breath sounds: Examination of the right-lower field reveals decreased breath sounds. Decreased breath sounds present. No wheezing. No rales.  Cardiovascular:     Normal rate. Regular rhythm. Normal S1. Normal S2.     Murmurs: There is no murmur.  Edema:    Peripheral edema absent.  Abdominal:     Palpations: Abdomen is soft. There is no hepatomegaly.  Skin:    General: Skin is warm and dry.  Neurological:     General: No focal deficit present.     Mental Status: Alert and oriented to person, place and time.      Cranial Nerves: Cranial nerves are intact.      ASSESSMENT & PLAN:    1. Chronic systolic CHF (congestive heart failure) (HCC) EF 20-25 by echocardiogram in August 2020.  NYHA II.  Volume status appears stable.  His blood pressure limits further titration of his medications.  Continue carvedilol 3.125 mg twice daily, digoxin 0.125 mg daily, furosemide 40 mg daily, spironolactone 12.5 mg daily.  As noted previously, I reviewed his case with Dr. Rayann Heman who felt that the patient is not  a candidate for ICD.  Obtain follow-up BMET today.  Follow-up with Dr. Acie Fredrickson in 6 months.  2. Coronary artery disease involving native coronary artery of native heart without angina pectoris Cath in 09/2018 with CTO of the RCA.  He is not having angina.  He is not on aspirin as he is on Warfarin.  Continue atorvastatin 40 mg daily.  3. Hyperlipidemia, unspecified hyperlipidemia type Continue high intensity statin.  Obtain follow-up lipids and LFTs today as he is fasting.  4. History of stroke Continue long-term anticoagulation with Warfarin.  His warfarin is managed by primary care.  5. Non-small cell lung cancer, right [facility] He continues follow-up with oncology.    Dispo:  Return in about 6 months (around 12/29/2019) for Routine Follow Up, w/ Dr. Acie Fredrickson, in person.   Medication Adjustments/Labs and Tests Ordered: Current medicines are reviewed at length with the patient today.  Concerns regarding medicines are outlined above.  Tests Ordered: Orders Placed This Encounter  Procedures  . Basic metabolic panel  . Hepatic function panel  . Lipid panel  . EKG 12-Lead   Medication Changes: No orders of the defined types were placed in this encounter.   Signed, Richardson Dopp, PA-C  07/01/2019 12:18 PM    [facility] [address], [place], [place]  [address] Phone: [phone]; Fax: [phone]

## 2019-07-01 ENCOUNTER — Encounter: Payer: Self-pay | Admitting: Physician Assistant

## 2019-07-01 ENCOUNTER — Ambulatory Visit (INDEPENDENT_AMBULATORY_CARE_PROVIDER_SITE_OTHER): Payer: Medicare Other | Admitting: Physician Assistant

## 2019-07-01 ENCOUNTER — Other Ambulatory Visit: Payer: Self-pay

## 2019-07-01 VITALS — BP 100/64 | HR 74 | Ht 73.0 in | Wt 142.4 lb

## 2019-07-01 DIAGNOSIS — E785 Hyperlipidemia, unspecified: Secondary | ICD-10-CM

## 2019-07-01 DIAGNOSIS — C3491 Malignant neoplasm of unspecified part of right bronchus or lung: Secondary | ICD-10-CM

## 2019-07-01 DIAGNOSIS — I2510 Atherosclerotic heart disease of native coronary artery without angina pectoris: Secondary | ICD-10-CM | POA: Diagnosis not present

## 2019-07-01 DIAGNOSIS — I5022 Chronic systolic (congestive) heart failure: Secondary | ICD-10-CM | POA: Diagnosis not present

## 2019-07-01 DIAGNOSIS — Z8673 Personal history of transient ischemic attack (TIA), and cerebral infarction without residual deficits: Secondary | ICD-10-CM | POA: Diagnosis not present

## 2019-07-01 NOTE — Patient Instructions (Signed)
Medication Instructions:   Your physician recommends that you continue on your current medications as directed. Please refer to the Current Medication list given to you today.  *If you need a refill on your cardiac medications before your next appointment, please call your pharmacy*  Lab Work:  You will have labs drawn today: BMET/LFTs/Lipids  If you have labs (blood work) drawn today and your tests are completely normal, you will receive your results only by: Marland Kitchen MyChart Message (if you have MyChart) OR . A paper copy in the mail If you have any lab test that is abnormal or we need to change your treatment, we will call you to review the results.  Testing/Procedures:  None ordered today  Follow-Up: At [facility], you and your health needs are our priority.  As part of our continuing mission to provide you with exceptional heart care, we have created designated Provider Care Teams.  These Care Teams include your primary Cardiologist (physician) and Advanced Practice Providers (APPs -  Physician Assistants and Nurse Practitioners) who all work together to provide you with the care you need, when you need it.  Your next appointment:   6 month(s)  The format for your next appointment:   In Person  Provider:   Mertie Moores, MD

## 2019-07-02 LAB — HEPATIC FUNCTION PANEL
ALT: 31 IU/L (ref 0–44)
AST: 28 IU/L (ref 0–40)
Albumin: 4.3 g/dL (ref 3.8–4.8)
Alkaline Phosphatase: 92 IU/L (ref 39–117)
Bilirubin Total: 0.5 mg/dL (ref 0.0–1.2)
Bilirubin, Direct: 0.11 mg/dL (ref 0.00–0.40)
Total Protein: 7 g/dL (ref 6.0–8.5)

## 2019-07-02 LAB — LIPID PANEL
Chol/HDL Ratio: 2.5 ratio (ref 0.0–5.0)
Cholesterol, Total: 171 mg/dL (ref 100–199)
HDL: 68 mg/dL (ref >39)
LDL Chol Calc (NIH): 92 mg/dL (ref 0–99)
Triglycerides: 52 mg/dL (ref 0–149)
VLDL Cholesterol Cal: 11 mg/dL (ref 5–40)

## 2019-07-02 LAB — BASIC METABOLIC PANEL
BUN/Creatinine Ratio: 22 (ref 10–24)
BUN: 19 mg/dL (ref 8–27)
CO2: 27 mmol/L (ref 20–29)
Calcium: 9.7 mg/dL (ref 8.6–10.2)
Chloride: 103 mmol/L (ref 96–106)
Creatinine, Ser: 0.88 mg/dL (ref 0.76–1.27)
GFR calc Af Amer: 102 mL/min/{1.73_m2} (ref >59)
GFR calc non Af Amer: 88 mL/min/{1.73_m2} (ref >59)
Glucose: 72 mg/dL (ref 65–99)
Potassium: 4.1 mmol/L (ref 3.5–5.2)
Sodium: 145 mmol/L — ABNORMAL HIGH (ref 134–144)

## 2019-07-04 ENCOUNTER — Telehealth: Payer: Self-pay | Admitting: Physician Assistant

## 2019-07-04 DIAGNOSIS — E785 Hyperlipidemia, unspecified: Secondary | ICD-10-CM

## 2019-07-04 MED ORDER — ATORVASTATIN CALCIUM 80 MG PO TABS: 80.0000 mg | ORAL_TABLET | Freq: Every day | ORAL | 3 refills | Status: DC

## 2019-07-04 NOTE — Telephone Encounter (Signed)
The patient has been notified of the result and verbalized understanding.  All questions (if any) were answered. Pt aware to increase his atorvastatin to 80mg  daily. New Rx sent. Pt will come in on Mon May 3rd for follow up lab work.  Wilma Flavin, RN 07/04/2019 8:41 AM

## 2019-07-04 NOTE — Telephone Encounter (Signed)
Patient returning call for lab results. 

## 2019-07-07 ENCOUNTER — Other Ambulatory Visit: Payer: Self-pay

## 2019-07-07 ENCOUNTER — Other Ambulatory Visit: Payer: Medicaid Other | Admitting: Hospice

## 2019-07-07 DIAGNOSIS — J42 Unspecified chronic bronchitis: Secondary | ICD-10-CM

## 2019-07-07 DIAGNOSIS — Z515 Encounter for palliative care: Secondary | ICD-10-CM

## 2019-07-07 NOTE — Progress Notes (Signed)
[facility] Pall[facility] Consult Note Telephone: [phone]  Fax: [phone]  PATIENT NAME: Jared Stevens DOB: 1950/06/08 MRN: [id number]  PRIMARY CARE PROVIDER:   Sonia Side., FNP  REFERRING PROVIDER:  Sonia Side., [place],  [place] [address]  RESPONSIBLE PARTY:Self [phone]  MIRO, BALDERSON, daughter [phone]  TELEHEALTH VISIT STATEMENT Due to the COVID-19 crisis, this visit was done via telephone from my office. It was initiated and consented to by this patient and/or family.  RECOMMENDATIONS/PLAN:  Advance Care Planning/Goals of Care: Telehealth Visit consisted of building trust and f/u on palliative care. Patient remains a full code.  Goals of care include to maximize quality of life and symptom management. Symptom management:Patient denied pain, chest pain/discomfort. No respiratory distress, no recent COPD flare.  Numbness in left upper extremity is ongoing, continuing to take Vit B6  And B12; he said he has an appointment to see his Neurologist 07/22/2019. He continues to go for his weekly INR check in PCP's office, no changes to his Coumadin dose. He has appointment next month to see Dr Earlie Server for his Lung CA which is currently in remission. Patient with no complaint or concerns at this time. Follow AS:[id number] care will continue to follow patient for goals of care clarification and symptom management I spent77minutes providing this consultation.More than 50% of the time in this consultation was spent on coordinating communication.  HISTORY OF PRESENT ILLNESS:Jared Atkinsis a 69 y.o.year oldmalewith multiple medical problems including CVA, CHF, HLD, COPD, lung cancer. Palliative Care was asked to help address goals of care.  CODE STATUS: Full   PPS: 60% HOSPICE ELIGIBILITY/DIAGNOSIS: TBD  PAST MEDICAL HISTORY:  Past Medical  History:  Diagnosis Date  . Chronic obstructive pulmonary disease   . Combined systolic and diastolic CHF    Mixed Ischemic/Non-Ischemic CM // Echo 09/2018: EF 15 // cMRI 10/2018: EF 11, inf scar // Echo 12/2018: EF 20-25, Gr 3 DD // Not a candidate for ICD due to comorbid illnesses   . Coronary artery disease    cath 09/2018: RCA chronically occluded >> Med Rx  . 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 Hx of recurrent R pleural effusion    s/p thoracentesis  . Hypercholesteremia   . Lung cancer ([place]) 03/2016  . Mixed Ischemic and Non-Ischemic Cardiomyopathy   . Non-small cell carcinoma of lung   . Nonsustained ventricular tachycardia     SOCIAL HX:  Social History   Tobacco Use  . Smoking status: Current Some Day Smoker  . Smokeless tobacco: Never Used  Substance Use Topics  . Alcohol use: Not Currently    ALLERGIES: No Known Allergies   PERTINENT MEDICATIONS:  Outpatient Encounter Medications as of 07/07/2019  Medication Sig  . amitriptyline (ELAVIL) 50 MG tablet Take 50 mg by mouth at bedtime.  Marland Kitchen atorvastatin (LIPITOR) 80 MG tablet Take 1 tablet (80 mg total) by mouth daily.  . carvedilol (COREG) 3.125 MG tablet Take 1 tablet (3.125 mg total) by mouth 2 (two) times daily.  . digoxin (LANOXIN) 0.125 MG tablet TK 1 T PO D  . furosemide (LASIX) 40 MG tablet Take 1 tablet (40 mg total) by mouth daily.  Marland Kitchen ipratropium (ATROVENT HFA) 17 MCG/ACT inhaler Administer two puffs into the lungs every six hours as needed for cough, congestion  . latanoprost (XALATAN) 0.005 % ophthalmic solution Place 1 drop into  both eyes 2 (two) times daily.   . mometasone (ASMANEX) 220 MCG/INH inhaler Administer two puffs into the lungs twice a day for COPD Rinse after use. Shake inhaler before use  . spironolactone (ALDACTONE) 25 MG tablet Take 0.5 tablets (12.5 mg total) by mouth daily.  . SYMBICORT 160-4.5 MCG/ACT inhaler INL 2 PFS PO BID IN THE MORNING AND IN  THE EVE  . warfarin (COUMADIN) 7.5 MG tablet Take 1 tablet (7.5 mg total) by mouth daily.   No facility-administered encounter medications on file as of 07/07/2019.    Teodoro Spray, NP

## 2019-07-25 ENCOUNTER — Ambulatory Visit (HOSPITAL_COMMUNITY)
Admission: RE | Admit: 2019-07-25 | Discharge: 2019-07-25 | Disposition: A | Discharge status: Home / Self Care | Payer: Medicare Other | Source: Ambulatory Visit | Attending: Internal Medicine | Admitting: Internal Medicine

## 2019-07-25 ENCOUNTER — Inpatient Hospital Stay: Payer: Medicare Other | Attending: Internal Medicine

## 2019-07-25 ENCOUNTER — Other Ambulatory Visit: Payer: Self-pay

## 2019-07-25 DIAGNOSIS — Z923 Personal history of irradiation: Secondary | ICD-10-CM | POA: Insufficient documentation

## 2019-07-25 DIAGNOSIS — C3490 Malignant neoplasm of unspecified part of unspecified bronchus or lung: Secondary | ICD-10-CM

## 2019-07-25 DIAGNOSIS — Z85118 Personal history of other malignant neoplasm of bronchus and lung: Secondary | ICD-10-CM | POA: Insufficient documentation

## 2019-07-25 DIAGNOSIS — Z9221 Personal history of antineoplastic chemotherapy: Secondary | ICD-10-CM | POA: Insufficient documentation

## 2019-07-25 LAB — CMP (CANCER CENTER ONLY)
ALT: 17 U/L (ref 0–44)
AST: 20 U/L (ref 15–41)
Albumin: 3.7 g/dL (ref 3.5–5.0)
Alkaline Phosphatase: 83 U/L (ref 38–126)
Anion gap: 8 (ref 5–15)
BUN: 18 mg/dL (ref 8–23)
CO2: 31 mmol/L (ref 22–32)
Calcium: 9.1 mg/dL (ref 8.9–10.3)
Chloride: 104 mmol/L (ref 98–111)
Creatinine: 0.97 mg/dL (ref 0.61–1.24)
GFR, Est AFR Am: >60 mL/min (ref >60)
GFR, Estimated: >60 mL/min (ref >60)
Glucose, Bld: 81 mg/dL (ref 70–99)
Potassium: 4 mmol/L (ref 3.5–5.1)
Sodium: 143 mmol/L (ref 135–145)
Total Bilirubin: 0.7 mg/dL (ref 0.3–1.2)
Total Protein: 7.3 g/dL (ref 6.5–8.1)

## 2019-07-25 LAB — CBC WITH DIFFERENTIAL (CANCER CENTER ONLY)
Abs Immature Granulocytes: 0.02 10*3/uL (ref 0.00–0.07)
Basophils Absolute: 0 10*3/uL (ref 0.0–0.1)
Basophils Relative: 0 %
Eosinophils Absolute: 0.2 10*3/uL (ref 0.0–0.5)
Eosinophils Relative: 3 %
HCT: 46 % (ref 39.0–52.0)
Hemoglobin: 15 g/dL (ref 13.0–17.0)
Immature Granulocytes: 0 %
Lymphocytes Relative: 23 %
Lymphs Abs: 1.6 10*3/uL (ref 0.7–4.0)
MCH: 29.5 pg (ref 26.0–34.0)
MCHC: 32.6 g/dL (ref 30.0–36.0)
MCV: 90.4 fL (ref 80.0–100.0)
Monocytes Absolute: 0.5 10*3/uL (ref 0.1–1.0)
Monocytes Relative: 7 %
Neutro Abs: 4.6 10*3/uL (ref 1.7–7.7)
Neutrophils Relative %: 67 %
Platelet Count: 275 10*3/uL (ref 150–400)
RBC: 5.09 MIL/uL (ref 4.22–5.81)
RDW: 16.5 % — ABNORMAL HIGH (ref 11.5–15.5)
WBC Count: 6.9 10*3/uL (ref 4.0–10.5)
nRBC: 0 % (ref 0.0–0.2)

## 2019-07-25 MED ORDER — SODIUM CHLORIDE (PF) 0.9 % IJ SOLN
INTRAMUSCULAR | Status: AC
  Filled 2019-07-25: qty 50

## 2019-07-25 MED ORDER — IOHEXOL 300 MG/ML  SOLN
75.0000 mL | Freq: Once | INTRAMUSCULAR | Status: AC | PRN
  Administered 2019-07-25: 75 mL via INTRAVENOUS

## 2019-07-28 ENCOUNTER — Ambulatory Visit: Payer: Medicare HMO | Admitting: Internal Medicine

## 2019-07-30 ENCOUNTER — Telehealth: Payer: Self-pay | Admitting: Internal Medicine

## 2019-07-30 NOTE — Telephone Encounter (Signed)
Rescheduled per 3/3 sch msg, pt req. Called and spoke with pt, confirmed 3/9 appt

## 2019-08-04 ENCOUNTER — Telehealth: Payer: Self-pay | Admitting: Medical Oncology

## 2019-08-04 NOTE — Telephone Encounter (Signed)
March 11th appt 0945.transportation arranged.  Reservation confirmed  Number [phone]  to the cancer center and back home at 1045.  Ride  will pickup pt at 0845 +/- 15 mins.    If pt needs to call for updates he needs to call   [phone] (Ride assist number)   LVM on pts phone with this ride information.  [facility] transportation arranged through [facility].

## 2019-08-05 ENCOUNTER — Ambulatory Visit: Payer: Medicare Other | Admitting: Internal Medicine

## 2019-08-05 ENCOUNTER — Telehealth: Payer: Self-pay | Admitting: Internal Medicine

## 2019-08-05 NOTE — Telephone Encounter (Signed)
I talk with patient regarding schedule  

## 2019-08-07 ENCOUNTER — Inpatient Hospital Stay: Payer: Medicare Other | Admitting: Internal Medicine

## 2019-08-19 ENCOUNTER — Inpatient Hospital Stay: Payer: Medicare Other | Attending: Internal Medicine | Admitting: Internal Medicine

## 2019-08-19 ENCOUNTER — Other Ambulatory Visit: Payer: Self-pay

## 2019-08-19 ENCOUNTER — Encounter: Payer: Self-pay | Admitting: Internal Medicine

## 2019-08-19 ENCOUNTER — Telehealth: Payer: Self-pay | Admitting: Internal Medicine

## 2019-08-19 VITALS — BP 109/75 | HR 81 | Temp 98.7°F | Resp 17 | Ht 73.0 in | Wt 143.1 lb

## 2019-08-19 DIAGNOSIS — E7800 Pure hypercholesterolemia, unspecified: Secondary | ICD-10-CM | POA: Insufficient documentation

## 2019-08-19 DIAGNOSIS — Z7951 Long term (current) use of inhaled steroids: Secondary | ICD-10-CM | POA: Diagnosis not present

## 2019-08-19 DIAGNOSIS — C3490 Malignant neoplasm of unspecified part of unspecified bronchus or lung: Secondary | ICD-10-CM

## 2019-08-19 DIAGNOSIS — Z79899 Other long term (current) drug therapy: Secondary | ICD-10-CM | POA: Diagnosis not present

## 2019-08-19 DIAGNOSIS — Z923 Personal history of irradiation: Secondary | ICD-10-CM | POA: Insufficient documentation

## 2019-08-19 DIAGNOSIS — I5042 Chronic combined systolic (congestive) and diastolic (congestive) heart failure: Secondary | ICD-10-CM | POA: Diagnosis not present

## 2019-08-19 DIAGNOSIS — Z7901 Long term (current) use of anticoagulants: Secondary | ICD-10-CM | POA: Insufficient documentation

## 2019-08-19 DIAGNOSIS — C3491 Malignant neoplasm of unspecified part of right bronchus or lung: Secondary | ICD-10-CM

## 2019-08-19 DIAGNOSIS — Z720 Tobacco use: Secondary | ICD-10-CM | POA: Diagnosis not present

## 2019-08-19 DIAGNOSIS — Z8673 Personal history of transient ischemic attack (TIA), and cerebral infarction without residual deficits: Secondary | ICD-10-CM | POA: Insufficient documentation

## 2019-08-19 DIAGNOSIS — J449 Chronic obstructive pulmonary disease, unspecified: Secondary | ICD-10-CM | POA: Diagnosis not present

## 2019-08-19 DIAGNOSIS — Z9221 Personal history of antineoplastic chemotherapy: Secondary | ICD-10-CM | POA: Diagnosis not present

## 2019-08-19 DIAGNOSIS — Z791 Long term (current) use of non-steroidal anti-inflammatories (NSAID): Secondary | ICD-10-CM | POA: Diagnosis not present

## 2019-08-19 DIAGNOSIS — Z85118 Personal history of other malignant neoplasm of bronchus and lung: Secondary | ICD-10-CM | POA: Insufficient documentation

## 2019-08-19 NOTE — Telephone Encounter (Signed)
Scheduled appt per 3/23 los - printed AVS and calender out for pt

## 2019-08-19 NOTE — Progress Notes (Signed)
[place] [phone]   [phone]  OFFICE PROGRESS NOTE  Sonia Side., FNP [place] [address]  DIAGNOSIS: Stage IIIA non-small cell lung cancer, adenocarcinoma   PRIOR THERAPY: status post a course of concurrent chemoradiation with weekly carboplatin and paclitaxel completed in early 2018.  The patient has been in observation since that time.  CURRENT THERAPY: Observation.  INTERVAL HISTORY: Granger Chui 69 y.o. male returns to the clinic today for 6 months follow-up visit.  The patient is feeling fine today with no concerning complaints.  He denied having any current chest pain, shortness of breath, cough or hemoptysis.  He denied having any recent weight loss or night sweats.  He has no nausea, vomiting, diarrhea or constipation.  He denied having any headache or visual changes.  He had repeat CT scan of the chest performed recently and he is here for evaluation and discussion of his discuss results.   MEDICAL HISTORY: Past Medical History:  Diagnosis Date  . Chronic obstructive pulmonary disease   . Combined systolic and diastolic CHF    Mixed Ischemic/Non-Ischemic CM // Echo 09/2018: EF 15 // cMRI 10/2018: EF 11, inf scar // Echo 12/2018: EF 20-25, Gr 3 DD // Not a candidate for ICD due to comorbid illnesses   . Coronary artery disease    cath 09/2018: RCA chronically occluded >> Med Rx  . 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 Hx of recurrent R pleural effusion    s/p thoracentesis  . Hypercholesteremia   . Lung cancer ([place]) 03/2016  . Mixed Ischemic and Non-Ischemic Cardiomyopathy   . Non-small cell carcinoma of lung   . Nonsustained ventricular tachycardia     ALLERGIES:  has No Known Allergies.  MEDICATIONS:  Current Outpatient Medications  Medication Sig Dispense Refill  . amitriptyline (ELAVIL) 50 MG tablet Take 50 mg by mouth at bedtime.    Marland Kitchen  atorvastatin (LIPITOR) 80 MG tablet Take 1 tablet (80 mg total) by mouth daily. 90 tablet 3  . digoxin (LANOXIN) 0.125 MG tablet TK 1 T PO D    . furosemide (LASIX) 40 MG tablet Take 1 tablet (40 mg total) by mouth daily. 30 tablet 2  . ibuprofen (ADVIL) 800 MG tablet Take 800 mg by mouth 3 (three) times daily.    Marland Kitchen ipratropium (ATROVENT HFA) 17 MCG/ACT inhaler Administer two puffs into the lungs every six hours as needed for cough, congestion    . latanoprost (XALATAN) 0.005 % ophthalmic solution Place 1 drop into both eyes 2 (two) times daily.   11  . spironolactone (ALDACTONE) 25 MG tablet Take 0.5 tablets (12.5 mg total) by mouth daily. 15 tablet 2  . SYMBICORT 160-4.5 MCG/ACT inhaler INL 2 PFS PO BID IN THE MORNING AND IN THE EVE    . warfarin (COUMADIN) 7.5 MG tablet Take 1 tablet (7.5 mg total) by mouth daily. 30 tablet 2  . albuterol (VENTOLIN HFA) 108 (90 Base) MCG/ACT inhaler Inhale 1 puff into the lungs every 6 (six) hours as needed.    . carvedilol (COREG) 3.125 MG tablet Take 1 tablet (3.125 mg total) by mouth 2 (two) times daily. 180 tablet 3  . mometasone (ASMANEX) 220 MCG/INH inhaler Administer two puffs into the lungs twice a day for COPD Rinse after use. Shake inhaler before use     No current facility-administered medications for  this visit.    SURGICAL HISTORY:  Past Surgical History:  Procedure Laterality Date  . Arm Surgery    . HERNIA REPAIR    . IR ANGIO INTRA EXTRACRAN SEL COM CAROTID INNOMINATE UNI L MOD SED  10/21/2018  . IR ANGIO VERTEBRAL SEL SUBCLAVIAN INNOMINATE UNI R MOD SED  10/21/2018  . IR CT HEAD LTD  10/21/2018  . IR PERCUTANEOUS ART THROMBECTOMY/INFUSION INTRACRANIAL INC DIAG ANGIO  10/21/2018  . IR THORACENTESIS ASP PLEURAL SPACE W/IMG GUIDE  09/27/2018  . RADIOLOGY WITH ANESTHESIA N/A 10/21/2018   Procedure: RADIOLOGY WITH ANESTHESIA;  Surgeon: Luanne Bras, MD;  Location: [place];  Service: Radiology;  Laterality: N/A;  . REPLANTATION THUMB    .  RIGHT/LEFT HEART CATH AND CORONARY ANGIOGRAPHY N/A 10/01/2018   Procedure: RIGHT/LEFT HEART CATH AND CORONARY ANGIOGRAPHY;  Surgeon: Troy Sine, MD;  Location: [place];  Service: Cardiovascular;  Laterality: N/A;    REVIEW OF SYSTEMS:  A comprehensive review of systems was negative except for: Constitutional: positive for fatigue   PHYSICAL EXAMINATION: General appearance: alert, cooperative, fatigued and no distress Head: Normocephalic, without obvious abnormality, atraumatic Neck: no adenopathy, no JVD, supple, symmetrical, trachea midline and thyroid not enlarged, symmetric, no tenderness/mass/nodules Lymph nodes: Cervical, supraclavicular, and axillary nodes normal. Resp: clear to auscultation bilaterally Back: symmetric, no curvature. ROM normal. No CVA tenderness. Cardio: regular rate and rhythm, S1, S2 normal, no murmur, click, rub or gallop GI: soft, non-tender; bowel sounds normal; no masses,  no organomegaly Extremities: extremities normal, atraumatic, no cyanosis or edema  ECOG PERFORMANCE STATUS: 1 - Symptomatic but completely ambulatory  Blood pressure 109/75, pulse 81, temperature 98.7 F (37.1 C), temperature source Temporal, resp. rate 17, height 6\' 1"  (1.854 m), weight 143 lb 1.6 oz (64.9 kg), SpO2 100 %.  LABORATORY DATA: Lab Results  Component Value Date   WBC 6.9 07/25/2019   HGB 15.0 07/25/2019   HCT 46.0 07/25/2019   MCV 90.4 07/25/2019   PLT 275 07/25/2019      Chemistry      Component Value Date/Time   NA 143 07/25/2019 1142   NA 145 (H) 07/01/2019 1210   K 4.0 07/25/2019 1142   CL 104 07/25/2019 1142   CO2 31 07/25/2019 1142   BUN 18 07/25/2019 1142   BUN 19 07/01/2019 1210   CREATININE 0.97 07/25/2019 1142      Component Value Date/Time   CALCIUM 9.1 07/25/2019 1142   ALKPHOS 83 07/25/2019 1142   AST 20 07/25/2019 1142   ALT 17 07/25/2019 1142   BILITOT 0.7 07/25/2019 1142       RADIOGRAPHIC STUDIES  ASSESSMENT AND PLAN:  This is a very pleasant 69 years old African-American male with history of a stage IIIa non-small cell lung cancer, adenocarcinoma status post a course of concurrent chemoradiation in [place] in 2017. He has been in observation since that time with no concerning complaints. He had repeat CT scan of the chest performed recently.  I personally and independently reviewed the scans and discussed the results with the patient today. His scan showed no concerning findings for disease progression. I recommended for the patient to continue on observation with repeat CT scan of the chest in 6 months. He was advised to call immediately if he has any concerning symptoms in the interval. The patient voices understanding of current disease status and treatment options and is in agreement with the current care plan. All questions were answered. The patient knows  to call the clinic with any problems, questions or concerns. We can certainly see the patient much sooner if necessary.  Disclaimer: This note was dictated with voice recognition software. Similar sounding words can inadvertently be transcribed and may not be corrected upon review.

## 2019-08-19 NOTE — Patient Instructions (Signed)
Steps to Quit Smoking Smoking tobacco is the leading cause of preventable death. It can affect almost every organ in the body. Smoking puts you and people around you at risk for many serious, long-lasting (chronic) diseases. Quitting smoking can be hard, but it is one of the best things that you can do for your health. It is never too late to quit. How do I get ready to quit? When you decide to quit smoking, make a plan to help you succeed. Before you quit:  Pick a date to quit. Set a date within the next 2 weeks to give you time to prepare.  Write down the reasons why you are quitting. Keep this list in places where you will see it often.  Tell your family, friends, and co-workers that you are quitting. Their support is important.  Talk with your doctor about the choices that may help you quit.  Find out if your health insurance will pay for these treatments.  Know the people, places, things, and activities that make you want to smoke (triggers). Avoid them. What first steps can I take to quit smoking?  Throw away all cigarettes at home, at work, and in your car.  Throw away the things that you use when you smoke, such as ashtrays and lighters.  Clean your car. Make sure to empty the ashtray.  Clean your home, including curtains and carpets. What can I do to help me quit smoking? Talk with your doctor about taking medicines and seeing a counselor at the same time. You are more likely to succeed when you do both.  If you are pregnant or breastfeeding, talk with your doctor about counseling or other ways to quit smoking. Do not take medicine to help you quit smoking unless your doctor tells you to do so. To quit smoking: Quit right away  Quit smoking totally, instead of slowly cutting back on how much you smoke over a period of time.  Go to counseling. You are more likely to quit if you go to counseling sessions regularly. Take medicine You may take medicines to help you quit. Some  medicines need a prescription, and some you can buy over-the-counter. Some medicines may contain a drug called nicotine to replace the nicotine in cigarettes. Medicines may:  Help you to stop having the desire to smoke (cravings).  Help to stop the problems that come when you stop smoking (withdrawal symptoms). Your doctor may ask you to use:  Nicotine patches, gum, or lozenges.  Nicotine inhalers or sprays.  Non-nicotine medicine that is taken by mouth. Find resources Find resources and other ways to help you quit smoking and remain smoke-free after you quit. These resources are most helpful when you use them often. They include:  Online chats with a counselor.  Phone quitlines.  Printed self-help materials.  Support groups or group counseling.  Text messaging programs.  Mobile phone apps. Use apps on your mobile phone or tablet that can help you stick to your quit plan. There are many free apps for mobile phones and tablets as well as websites. Examples include Quit Guide from the CDC and smokefree.gov  What things can I do to make it easier to quit?   Talk to your family and friends. Ask them to support and encourage you.  Call a phone quitline (1-800-QUIT-NOW), reach out to support groups, or work with a counselor.  Ask people who smoke to not smoke around you.  Avoid places that make you want to smoke,   such as: ? Bars. ? Parties. ? Smoke-break areas at work.  Spend time with people who do not smoke.  Lower the stress in your life. Stress can make you want to smoke. Try these things to help your stress: ? Getting regular exercise. ? Doing deep-breathing exercises. ? Doing yoga. ? Meditating. ? Doing a body scan. To do this, close your eyes, focus on one area of your body at a time from head to toe. Notice which parts of your body are tense. Try to relax the muscles in those areas. How will I feel when I quit smoking? Day 1 to 3 weeks Within the first 24 hours,  you may start to have some problems that come from quitting tobacco. These problems are very bad 2-3 days after you quit, but they do not often last for more than 2-3 weeks. You may get these symptoms:  Mood swings.  Feeling restless, nervous, angry, or annoyed.  Trouble concentrating.  Dizziness.  Strong desire for high-sugar foods and nicotine.  Weight gain.  Trouble pooping (constipation).  Feeling like you may vomit (nausea).  Coughing or a sore throat.  Changes in how the medicines that you take for other issues work in your body.  Depression.  Trouble sleeping (insomnia). Week 3 and afterward After the first 2-3 weeks of quitting, you may start to notice more positive results, such as:  Better sense of smell and taste.  Less coughing and sore throat.  Slower heart rate.  Lower blood pressure.  Clearer skin.  Better breathing.  Fewer sick days. Quitting smoking can be hard. Do not give up if you fail the first time. Some people need to try a few times before they succeed. Do your best to stick to your quit plan, and talk with your doctor if you have any questions or concerns. Summary  Smoking tobacco is the leading cause of preventable death. Quitting smoking can be hard, but it is one of the best things that you can do for your health.  When you decide to quit smoking, make a plan to help you succeed.  Quit smoking right away, not slowly over a period of time.  When you start quitting, seek help from your doctor, family, or friends. This information is not intended to replace advice given to you by your health care provider. Make sure you discuss any questions you have with your health care provider. Document Revised: 02/07/2019 Document Reviewed: 08/03/2018 Elsevier Patient Education  2020 Elsevier Inc.  

## 2019-09-05 ENCOUNTER — Other Ambulatory Visit: Payer: Self-pay | Admitting: Cardiovascular Disease

## 2019-09-05 MED ORDER — ATORVASTATIN CALCIUM 80 MG PO TABS: 80.0000 mg | ORAL_TABLET | Freq: Every day | ORAL | 3 refills | Status: DC

## 2019-09-05 NOTE — Telephone Encounter (Signed)
New Message      *STAT* If patient is at the pharmacy, call can be transferred to refill team.   1. Which medications need to be refilled? (please list name of each medication and dose if known) atorvastatin (LIPITOR) 80 MG tablet  2. Which pharmacy/location (including street and city if local pharmacy) is medication to be sent to? [place], [place]  3. Do they need a 30 day or 90 day supply? [place]

## 2019-09-29 ENCOUNTER — Other Ambulatory Visit: Payer: Medicare Other

## 2019-09-29 ENCOUNTER — Telehealth: Payer: Self-pay | Admitting: Physician Assistant

## 2019-09-29 NOTE — Telephone Encounter (Signed)
Called to discuss the homebound vaccination initiative with the patient and/or caregiver.   Message left to call back.  Angelena Form PA-C  [facility]

## 2019-10-01 ENCOUNTER — Ambulatory Visit (HOSPITAL_COMMUNITY)
Admission: EM | Admit: 2019-10-01 | Discharge: 2019-10-01 | Disposition: A | Discharge status: Home / Self Care | Payer: Medicare Other | Attending: Family Medicine | Admitting: Family Medicine

## 2019-10-01 ENCOUNTER — Other Ambulatory Visit: Payer: Self-pay

## 2019-10-01 ENCOUNTER — Encounter (HOSPITAL_COMMUNITY): Payer: Self-pay | Admitting: Emergency Medicine

## 2019-10-01 DIAGNOSIS — K4091 Unilateral inguinal hernia, without obstruction or gangrene, recurrent: Secondary | ICD-10-CM

## 2019-10-01 NOTE — ED Provider Notes (Signed)
[facility]   [id number] 10/01/19 Arrival Time: 1132  ASSESSMENT & PLAN:  1. Unilateral recurrent inguinal hernia without obstruction or gangrene     Benign abdominal exam. No s/s of strangulation. No indications for urgent abdominal/pelvic imaging at this time..  Recommend: Follow-up Information    Schedule an appointment as soon as possible for a visit  with [facility], [place].   Specialty: General Surgery Contact information: [address] [place] [address] [phone]            Reviewed expectations re: course of current medical issues. Questions answered. Outlined signs and symptoms indicating need for more acute intervention. Patient verbalized understanding. After Visit Summary given.   SUBJECTIVE: History from: patient. Jared Stevens is a 69 y.o. male who presents with complaint of left inguinal hernia. Previously repaired in 1977. Unsure when recurred. No pain. Able to reduce when needed.  "Just coming to have it checked". Ambulatory without difficulty. Normal bowel/bladder habits.   Past Surgical History:  Procedure Laterality Date  . Arm Surgery    . HERNIA REPAIR    . IR ANGIO INTRA EXTRACRAN SEL COM CAROTID INNOMINATE UNI L MOD SED  10/21/2018  . IR ANGIO VERTEBRAL SEL SUBCLAVIAN INNOMINATE UNI R MOD SED  10/21/2018  . IR CT HEAD LTD  10/21/2018  . IR PERCUTANEOUS ART THROMBECTOMY/INFUSION INTRACRANIAL INC DIAG ANGIO  10/21/2018  . IR THORACENTESIS ASP PLEURAL SPACE W/IMG GUIDE  09/27/2018  . RADIOLOGY WITH ANESTHESIA N/A 10/21/2018   Procedure: RADIOLOGY WITH ANESTHESIA;  Surgeon: Luanne Bras, MD;  Location: [facility];  Service: Radiology;  Laterality: N/A;  . REPLANTATION THUMB    . RIGHT/LEFT HEART CATH AND CORONARY ANGIOGRAPHY N/A 10/01/2018   Procedure: RIGHT/LEFT HEART CATH AND CORONARY ANGIOGRAPHY;  Surgeon: Troy Sine, MD;  Location: [facility];  Service: Cardiovascular;  Laterality: N/A;      OBJECTIVE:  Vitals:   10/01/19 1213  BP: 102/66  Pulse: 81  Resp: 16  Temp: 98.4 F (36.9 C)  TempSrc: Oral  SpO2: 99%    General appearance: alert, oriented, no acute distress Abdomen: soft; left direct inguinal hernia; easily reduced; no overlying skin changes Skin: warm and dry Psychological: alert and cooperative; normal mood and affect    No Known Allergies                                             Past Medical History:  Diagnosis Date  . Chronic obstructive pulmonary disease   . Combined systolic and diastolic CHF    Mixed Ischemic/Non-Ischemic CM // Echo 09/2018: EF 15 // cMRI 10/2018: EF 11, inf scar // Echo 12/2018: EF 20-25, Gr 3 DD // Not a candidate for ICD due to comorbid illnesses   . Coronary artery disease    cath 09/2018: RCA chronically occluded >> Med Rx  . 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 Hx of recurrent R pleural effusion    s/p thoracentesis  . Hypercholesteremia   . Lung cancer ([place]) 03/2016  . Mixed Ischemic and Non-Ischemic Cardiomyopathy   . Non-small cell carcinoma of lung   . Nonsustained ventricular tachycardia     Social History   Socioeconomic History  . Marital status: Single    Spouse name: Not on file  .  Number of children: Not on file  . Years of education: Not on file  . Highest education level: Not on file  Occupational History  . Not on file  Tobacco Use  . Smoking status: Current Some Day Smoker  . Smokeless tobacco: Never Used  Substance and Sexual Activity  . Alcohol use: Not Currently  . Drug use: Not Currently  . Sexual activity: Not on file  Other Topics Concern  . Not on file  Social History Narrative  . Not on file   Social Determinants of Health   Financial Resource Strain:   . Difficulty of Paying Living Expenses:   Food Insecurity:   . Worried About Charity fundraiser in the Last Year:   . Arboriculturist in the Last Year:   Transportation  Needs: Unmet Transportation Needs  . Lack of Transportation (Medical): Yes  . Lack of Transportation (Non-Medical): Yes  Physical Activity:   . Days of Exercise per Week:   . Minutes of Exercise per Session:   Stress:   . Feeling of Stress :   Social Connections:   . Frequency of Communication with Friends and Family:   . Frequency of Social Gatherings with Friends and Family:   . Attends Religious Services:   . Active Member of Clubs or Organizations:   . Attends Archivist Meetings:   Marland Kitchen Marital Status:   Intimate Partner Violence:   . Fear of Current or Ex-Partner:   . Emotionally Abused:   Marland Kitchen Physically Abused:   . Sexually Abused:     Family History  Problem Relation Age of Onset  . Aneurysm Mother      Vanessa Kick, MD 10/01/19 1311

## 2019-10-01 NOTE — ED Triage Notes (Signed)
PT has a self-reported inguinal hernia on left side that has increased in size over the last month. He is able to reduce it himself. He has not yet sought medical treatment.

## 2019-10-09 ENCOUNTER — Other Ambulatory Visit: Payer: Medicare Other | Admitting: Hospice

## 2019-10-09 ENCOUNTER — Other Ambulatory Visit: Payer: Self-pay

## 2019-10-09 DIAGNOSIS — Z515 Encounter for palliative care: Secondary | ICD-10-CM

## 2019-10-09 DIAGNOSIS — J449 Chronic obstructive pulmonary disease, unspecified: Secondary | ICD-10-CM

## 2019-10-09 NOTE — Progress Notes (Addendum)
[facility] Consult Note Telephone: [phone]  Fax: [phone]  PATIENT NAME: Jared Stevens DOB: 07/18/1950 MRN: [id number]  PRIMARY CARE PROVIDER:   Sonia Side., FNP  REFERRING PROVIDER: Sonia Side., FNP   RESPONSIBLE PARTY:Self [phone]  JULIAS, MOULD, daughter [phone]  TELEHEALTH VISIT STATEMENT Due to the COVID-19 crisis, this visit was done via telephone from my office. It was initiated and consented to by this patient and/or family.  RECOMMENDATIONS/PLAN:  Advance Care Planning/Goals of Care: Telehealth Visit consisted of building trust and f/u on palliative care. Patient remains a full code. Goals of care include to maximize quality of life and symptom management. Patient agreed to in person visit to further discuss goals of care clarification. Visit consisted of counseling and education dealing with the complex and emotionally intense issues of symptom management and palliative care in the setting of serious and potentially life-threatening illness. Palliative care team will continue to support patient, patient's family, and medical team.  Symptom management: Patient was seen last week in the ED for unilateral recurrent inguinal hernia without obstruction or gangrene. Chart review shows this was previously repaired in 1977.  Patient denies pain and said he has appointment for possible surgical repair 10/23/2019.  No respiratory distress, no recent COPD flare; he denies edema. Numbness in left upperextremity is ongoing/chronic, continuing to take Vit B6  And B12; he continues to see his neurologist as planned.  He goes for his weekly INR check in PCP's office.  He said overall he is doing well.  Patient with no complaint or concerns at this time.  Encouraged ongoing care.  Follow DJ:[id number] care will continue to follow patient for goals of care  clarification and symptom management.  I spent 9minutes providing this consultation.More than 50% of the time in this consultation was spent on coordinating communication.  HISTORY OF PRESENT ILLNESS:Jared Atkinsis a 69 y.o.year oldmalewith multiple medical problems including CVA, CHF, HLD, COPD, lung cancer. Palliative Care was asked to help address goals of care.  CODE STATUS: Full   PPS: 60% HOSPICE ELIGIBILITY/DIAGNOSIS: TBD  PAST MEDICAL HISTORY:  Past Medical History:  Diagnosis Date  . Chronic obstructive pulmonary disease   . Combined systolic and diastolic CHF    Mixed Ischemic/Non-Ischemic CM // Echo 09/2018: EF 15 // cMRI 10/2018: EF 11, inf scar // Echo 12/2018: EF 20-25, Gr 3 DD // Not a candidate for ICD due to comorbid illnesses   . Coronary artery disease    cath 09/2018: RCA chronically occluded >> Med Rx  . 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 Hx of recurrent R pleural effusion    s/p thoracentesis  . Hypercholesteremia   . Lung cancer ([place]) 03/2016  . Mixed Ischemic and Non-Ischemic Cardiomyopathy   . Non-small cell carcinoma of lung   . Nonsustained ventricular tachycardia     SOCIAL HX:  Social History   Tobacco Use  . Smoking status: Current Some Day Smoker  . Smokeless tobacco: Never Used  Substance Use Topics  . Alcohol use: Not Currently    ALLERGIES: No Known Allergies   PERTINENT MEDICATIONS:  Outpatient Encounter Medications as of 10/09/2019  Medication Sig  . albuterol (VENTOLIN HFA) 108 (90 Base) MCG/ACT inhaler Inhale 1 puff into the lungs every 6 (six) hours as needed.  Marland Kitchen amitriptyline (ELAVIL) 50 MG tablet Take 50 mg by mouth at bedtime.  Marland Kitchen  atorvastatin (LIPITOR) 80 MG tablet Take 1 tablet (80 mg total) by mouth daily.  . carvedilol (COREG) 3.125 MG tablet Take 1 tablet (3.125 mg total) by mouth 2 (two) times daily.  . digoxin (LANOXIN) 0.125 MG tablet TK 1 T PO D  . furosemide  (LASIX) 40 MG tablet Take 1 tablet (40 mg total) by mouth daily.  Marland Kitchen ibuprofen (ADVIL) 800 MG tablet Take 800 mg by mouth 3 (three) times daily.  Marland Kitchen ipratropium (ATROVENT HFA) 17 MCG/ACT inhaler Administer two puffs into the lungs every six hours as needed for cough, congestion  . latanoprost (XALATAN) 0.005 % ophthalmic solution Place 1 drop into both eyes 2 (two) times daily.   . mometasone (ASMANEX) 220 MCG/INH inhaler Administer two puffs into the lungs twice a day for COPD Rinse after use. Shake inhaler before use  . spironolactone (ALDACTONE) 25 MG tablet Take 0.5 tablets (12.5 mg total) by mouth daily.  . SYMBICORT 160-4.5 MCG/ACT inhaler INL 2 PFS PO BID IN THE MORNING AND IN THE EVE  . warfarin (COUMADIN) 7.5 MG tablet Take 1 tablet (7.5 mg total) by mouth daily.   No facility-administered encounter medications on file as of 10/09/2019.     Teodoro Spray, NP

## 2019-10-13 ENCOUNTER — Telehealth: Payer: Self-pay | Admitting: Unknown Physician Specialty

## 2019-10-13 ENCOUNTER — Telehealth: Payer: Self-pay | Admitting: Nurse Practitioner

## 2019-10-13 NOTE — Telephone Encounter (Signed)
I connected by phone with [name] and/or patient's caregiver on 10/13/2019 at 4:01 PM to discuss the potential vaccination through our Homebound vaccination initiative.   Prevaccination Checklist for COVID-19 Vaccines  1.  Are you feeling sick today? no  2.  Have you ever received a dose of a COVID-19 vaccine?  no      If yes, which one? None   3.  Have you ever had an allergic reaction: (This would include a severe reaction [ e.g., anaphylaxis] that required treatment with epinephrine or EpiPen or that caused you to go to the hospital.  It would also include an allergic reaction that occurred within 4 hours that caused hives, swelling, or respiratory distress, including wheezing.) A.  A previous dose of COVID-19 vaccine. no  B.  A vaccine or injectable therapy that contains multiple components, one of which is a COVID-19 vaccine component, but it is not known which component elicited the immediate reaction. no  C.  Are you allergic to polyethylene glycol? no   4.  Have you ever had an allergic reaction to another vaccine (other than COVID-19 vaccine) or an injectable medication? (This would include a severe reaction [ e.g., anaphylaxis] that required treatment with epinephrine or EpiPen or that caused you to go to the hospital.  It would also include an allergic reaction that occurred within 4 hours that caused hives, swelling, or respiratory distress, including wheezing.)  no   5.  Have you ever had a severe allergic reaction (e.g., anaphylaxis) to something other than a component of the COVID-19 vaccine, or any vaccine or injectable medication?  This would include food, pet, venom, environmental, or oral medication allergies.  no   6.  Have you received any vaccine in the last 14 days? no   7.  Have you ever had a positive test for COVID-19 or has a doctor ever told you that you had COVID-19?  no   8.  Have you received passive antibody therapy (monoclonal antibodies or convalescent  serum) as a treatment for COVID-19? no   9.  Do you have a weakened immune system caused by something such as HIV infection or cancer or do you take immunosuppressive drugs or therapies?  no   10.  Do you have a bleeding disorder or are you taking a blood thinner? yes   11.  Are you pregnant or breast-feeding? no   12.  Do you have dermal fillers? no   __________________   This patient is a 69 y.o. male that meets the FDA criteria to receive homebound vaccination. Patient or parent/caregiver understands they have the option to accept or refuse homebound vaccination.  Patient passed the pre-screening checklist and would like to proceed with homebound vaccination.  Based on questionnaire above, I recommend the patient be observed for 30 minutes.  There are an estimated 0 of other household members/caregivers who are also interested in receiving the vaccine.      I will send the patient's information to our scheduling team who will reach out to schedule the patient and potential caregiver/family members for homebound vaccination.    Kathrine Haddock 10/13/2019 4:01 PM

## 2019-10-13 NOTE — Telephone Encounter (Signed)
Called to screen for possible home bound covid vaccine - unable to contact patient.

## 2019-10-23 ENCOUNTER — Ambulatory Visit: Payer: Medicare Other | Attending: Critical Care Medicine

## 2019-10-23 ENCOUNTER — Telehealth: Payer: Self-pay | Admitting: *Deleted

## 2019-10-23 ENCOUNTER — Telehealth: Payer: Self-pay | Admitting: Physician Assistant

## 2019-10-23 DIAGNOSIS — Z23 Encounter for immunization: Secondary | ICD-10-CM

## 2019-10-23 NOTE — Telephone Encounter (Signed)
Pt still has significant LV dysfunction  and has had a CVA I agree with Lovenox bridging while he is off his couimadin. Further details per Marcelle Overlie, [facility]

## 2019-10-23 NOTE — Telephone Encounter (Signed)
   [facility] Pre-operative Risk Assessment    HEARTCARE STAFF: - Please ensure there is not already an duplicate clearance open for this procedure. - Under Visit Info/Reason for Call, type in Other and utilize the format Clearance MM/DD/YY or Clearance TBD. Do not use dashes or single digits. - If request is for dental extraction, please clarify the # of teeth to be extracted.  Request for surgical clearance:  1. What type of surgery is being performed? HERNIA REPAIR   2. When is this surgery scheduled? TBD   3. What type of clearance is required (medical clearance vs. Pharmacy clearance to hold med vs. Both)? BOTH  4. Are there any medications that need to be held prior to surgery and how long? WARFARIN x 5 DAYS PRIOR TO PROCEDURE   5. Practice name and name of physician performing surgery? [facility]; DR. PAUL TOTH   6. What is the office phone number? [phone]   7.   What is the office fax number?  [place]: [place], CMA  8.   Anesthesia type (None, local, MAC, general) ? GENERAL   Julaine Hua 10/23/2019, 11:01 AM  _________________________________________________________________   (provider comments below)

## 2019-10-23 NOTE — Telephone Encounter (Signed)
Patient with diagnosis of CVA  in the setting of cardioembolism secondary to severe LV dysfunction in 2020 on warfarin for anticoagulation.    Procedure: HERNIA REPAIR  Date of procedure: TBD  Patient INRs are managed by PCP at [facility], however I will defer to Dr. Acie Fredrickson as to if a lovenox bridge is needed.

## 2019-10-23 NOTE — Progress Notes (Signed)
   Covid-19 Vaccination Clinic  Name:  Revel Stellmach    MRN: [id number] DOB: [birth year]  10/23/2019  Mr. Galbreath was observed post Covid-19 immunization for 15 minutes without incident. He was provided with Vaccine Information Sheet and instruction to access the V-Safe system.   Mr. Clute was instructed to call 911 with any severe reactions post vaccine: Marland Kitchen Difficulty breathing  . Swelling of face and throat  . A fast heartbeat  . A bad rash all over body  . Dizziness and weakness   Immunizations Administered    Name Date Dose VIS Date Route   Moderna COVID-19 Vaccine 10/23/2019  2:14 PM 0.5 mL 04/2019 Intramuscular   Manufacturer: Moderna   Lot: [id number]   [place]: [id number]

## 2019-10-23 NOTE — Telephone Encounter (Signed)
Patient called during Epic downtime returned call in reference to scheduling appt with Richardson Dopp. Left vm

## 2019-10-24 NOTE — Telephone Encounter (Signed)
Faxed via Epic to requesting providers office

## 2019-10-24 NOTE — Telephone Encounter (Signed)
   Primary Cardiologist: Mertie Moores, MD  Chart reviewed as part of pre-operative protocol coverage. Patient has an upcoming appointment with Dr. Acie Fredrickson 10/31/19, at which time his preoperative status can be addressed.   Per pharmacy and Dr. Elmarie Shiley recommendations, patient can hold coumadin 5 days prior to his upcoming procedure but WILL require a lovenox bridge which will need to be coordinated by the patients PCP at [facility].  Pre-op covering staff: - Please add "pre-op clearance" to the appointment notes so provider is aware. - Please contact requesting surgeon's office via preferred method (i.e, phone, fax) to inform them of need for appointment prior to surgery.   Abigail Butts, PA-C  10/24/2019, 9:42 AM

## 2019-10-24 NOTE — Telephone Encounter (Signed)
Pt has appt with Dr. Acie Fredrickson 10/31/19. Will add notes to appt needs pre op clearance. I will forward to MD for upcoming appt.

## 2019-10-31 ENCOUNTER — Other Ambulatory Visit: Payer: Self-pay

## 2019-10-31 ENCOUNTER — Ambulatory Visit (INDEPENDENT_AMBULATORY_CARE_PROVIDER_SITE_OTHER): Payer: Medicare Other | Admitting: Cardiovascular Disease

## 2019-10-31 ENCOUNTER — Encounter: Payer: Self-pay | Admitting: Cardiovascular Disease

## 2019-10-31 DIAGNOSIS — I5042 Chronic combined systolic (congestive) and diastolic (congestive) heart failure: Secondary | ICD-10-CM | POA: Diagnosis not present

## 2019-10-31 NOTE — Telephone Encounter (Signed)
I have seen [name] today for follow-up visit.  He has a history of chronic combined systolic and diastolic congestive heart failure.  He is very well compensated.  He denies any chest pain or shortness of breath.  He is on warfarin for history of stroke.  From a cardiac standpoint he is at moderate to high risk based on his low ejection fraction.  He is well compensated and is adequately tuned up for his hernia surgery.  He is on warfarin which is managed by [employer].. I think that he needs to be bridged with Lovenox as he comes off his warfarin.  Specific recommendations regarding his Coumadin management and Lovenox management will be up to [facility].

## 2019-10-31 NOTE — Progress Notes (Signed)
Cardiology Office Note:    Date:  10/31/2019   ID:  Jared Stevens, DOB 1950-08-31, MRN [id number]  PCP:  Sonia Side., FNP  [facility] Cardiologist:  Mertie Moores, MD  [place] Electrophysiologist:  None   Referring MD: Sonia Side., FNP   Chief Complaint  Patient presents with  . Congestive Heart Failure    History of Present Illness:    Jared Stevens is a 69 y.o. 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  He has a history of coronary artery disease with a occluded right coronary artery by heart catheterization in May, 2020.  He has a history of COPD, hyperlipidemia, stage IIIa non-small cell lung cancer with recurrent right pleural effusion.  He has a history of repetitive cocaine use.  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 He is on Coumadin which is managed by [employer].  He was originally seen  by Dr. Rayann Heman who felt that he was not a candidate for ICD. His most recent echocardiogram from August, 2020 reveals slightly improved LV function with an EF of 20 to 25%.  He has grade 3 diastolic dysfunction.  No cp or dyspnea.  INR has been well controlled.  He has no CP or dyspnea.   Very well compensated.    Past Medical History:  Diagnosis Date  . Chronic obstructive pulmonary disease   . Combined systolic and diastolic CHF    Mixed Ischemic/Non-Ischemic CM // Echo 09/2018: EF 15 // cMRI 10/2018: EF 11, inf scar // Echo 12/2018: EF 20-25, Gr 3 DD // Not a candidate for ICD due to comorbid illnesses   . Coronary artery disease    cath 09/2018: RCA chronically occluded >> Med Rx  . 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 Hx of recurrent R pleural effusion    s/p thoracentesis  . Hypercholesteremia   . Lung cancer ([place]) 03/2016  . Mixed Ischemic and Non-Ischemic Cardiomyopathy   . Non-small cell  carcinoma of lung   . Nonsustained ventricular tachycardia     Past Surgical History:  Procedure Laterality Date  . Arm Surgery    . HERNIA REPAIR    . IR ANGIO INTRA EXTRACRAN SEL COM CAROTID INNOMINATE UNI L MOD SED  10/21/2018  . IR ANGIO VERTEBRAL SEL SUBCLAVIAN INNOMINATE UNI R MOD SED  10/21/2018  . IR CT HEAD LTD  10/21/2018  . IR PERCUTANEOUS ART THROMBECTOMY/INFUSION INTRACRANIAL INC DIAG ANGIO  10/21/2018  . IR THORACENTESIS ASP PLEURAL SPACE W/IMG GUIDE  09/27/2018  . RADIOLOGY WITH ANESTHESIA N/A 10/21/2018   Procedure: RADIOLOGY WITH ANESTHESIA;  Surgeon: Luanne Bras, MD;  Location: [place];  Service: Radiology;  Laterality: N/A;  . REPLANTATION THUMB    . RIGHT/LEFT HEART CATH AND CORONARY ANGIOGRAPHY N/A 10/01/2018   Procedure: RIGHT/LEFT HEART CATH AND CORONARY ANGIOGRAPHY;  Surgeon: Troy Sine, MD;  Location: [facility];  Service: Cardiovascular;  Laterality: N/A;    Current Medications: Current Meds  Medication Sig  . albuterol (VENTOLIN HFA) 108 (90 Base) MCG/ACT inhaler Inhale 1 puff into the lungs every 6 (six) hours as needed.  Marland Kitchen amitriptyline (ELAVIL) 50 MG tablet Take 50 mg by mouth at bedtime.  Marland Kitchen atorvastatin (LIPITOR) 80 MG tablet Take 1 tablet (80 mg total) by mouth daily.  . carvedilol (COREG) 3.125 MG tablet Take  1 tablet (3.125 mg total) by mouth 2 (two) times daily.  . digoxin (LANOXIN) 0.125 MG tablet TK 1 T PO D  . furosemide (LASIX) 40 MG tablet Take 1 tablet (40 mg total) by mouth daily.  Marland Kitchen ibuprofen (ADVIL) 800 MG tablet Take 800 mg by mouth 3 (three) times daily.  Marland Kitchen ipratropium (ATROVENT HFA) 17 MCG/ACT inhaler Administer two puffs into the lungs every six hours as needed for cough, congestion  . latanoprost (XALATAN) 0.005 % ophthalmic solution Place 1 drop into both eyes 2 (two) times daily.   . mometasone (ASMANEX) 220 MCG/INH inhaler Administer two puffs into the lungs twice a day for COPD Rinse after use. Shake inhaler before use  .  spironolactone (ALDACTONE) 25 MG tablet Take 0.5 tablets (12.5 mg total) by mouth daily.  . SYMBICORT 160-4.5 MCG/ACT inhaler INL 2 PFS PO BID IN THE MORNING AND IN THE EVE  . warfarin (COUMADIN) 7.5 MG tablet Take 1 tablet (7.5 mg total) by mouth daily.     Allergies:   Patient has no known allergies.   Social History   Socioeconomic History  . Marital status: Single    Spouse name: Not on file  . Number of children: Not on file  . Years of education: Not on file  . Highest education level: Not on file  Occupational History  . Not on file  Tobacco Use  . Smoking status: Current Some Day Smoker  . Smokeless tobacco: Never Used  Substance and Sexual Activity  . Alcohol use: Not Currently  . Drug use: Not Currently  . Sexual activity: Not on file  Other Topics Concern  . Not on file  Social History Narrative  . Not on file   Social Determinants of Health   Financial Resource Strain:   . Difficulty of Paying Living Expenses:   Food Insecurity:   . Worried About Charity fundraiser in the Last Year:   . Arboriculturist in the Last Year:   Transportation Needs: Unmet Transportation Needs  . Lack of Transportation (Medical): Yes  . Lack of Transportation (Non-Medical): Yes  Physical Activity:   . Days of Exercise per Week:   . Minutes of Exercise per Session:   Stress:   . Feeling of Stress :   Social Connections:   . Frequency of Communication with Friends and Family:   . Frequency of Social Gatherings with Friends and Family:   . Attends Religious Services:   . Active Member of Clubs or Organizations:   . Attends Archivist Meetings:   Marland Kitchen Marital Status:      Family History: The patient's family history includes Aneurysm in his mother.  ROS:   Please see the history of present illness.     All other systems reviewed and are negative.  EKGs/Labs/Other Studies Reviewed:    The following studies were reviewed today:   EKG:  Feb , 21, 2021:   NSR , no  ST or T wave changes.   Recent Labs: 07/25/2019: ALT 17; BUN 18; Creatinine 0.97; Hemoglobin 15.0; Platelet Count 275; Potassium 4.0; Sodium 143  Recent Lipid Panel    Component Value Date/Time   CHOL 171 07/01/2019 1210   TRIG 52 07/01/2019 1210   HDL 68 07/01/2019 1210   CHOLHDL 2.5 07/01/2019 1210   CHOLHDL 3.2 10/22/2018 0425   VLDL 17 10/22/2018 0425   LDLCALC 92 07/01/2019 1210    Physical Exam:    VS:  BP Marland Kitchen)  102/58   Pulse 90   Ht 6\' 1"  (1.854 m)   Wt 135 lb 12 oz (61.6 kg)   SpO2 95%   BMI 17.91 kg/m     Wt Readings from Last 3 Encounters:  10/31/19 135 lb 12 oz (61.6 kg)  08/19/19 143 lb 1.6 oz (64.9 kg)  07/01/19 142 lb 6.4 oz (64.6 kg)     GEN:  Thin black gentleman ,  NAD  HEENT: Normal NECK: No JVD; No carotid bruits LYMPHATICS: No lymphadenopathy CARDIAC:  RR , soft systolic  murmur RESPIRATORY:  Clear to auscultation without rales, wheezing or rhonchi  ABDOMEN: Soft, non-tender, non-distended MUSCULOSKELETAL:  No edema; No deformity  SKIN: Warm and dry NEUROLOGIC:  Alert and oriented x 3 PSYCHIATRIC:  Normal affect   ASSESSMENT:    No diagnosis found. PLAN:    In order of problems listed above:   1. Chronic combined systolic / diastolic CHF:   He Has known chronic combined systolic and diastolic congestive heart failure.  His ejection fraction has improved slightly and is now 20 to 25%.  He is well compensated at this point he denies any chest pain or shortness of breath.  He denies any PND orthopnea.  He needs to have hernia surgery.  He is at moderate to high risk for his hernia surgery based on his congestive heart failure but he is adequately tuned up and well compensated.  He is on warfarin which is managed by [facility] Delene Ruffini, FNP)   they will have to manage his Coumadin and Lovenox bridging.  I do suggest that he have a Lovenox bridge because of his history of strokes.  2.  Coronary artery disease: He is stable.  He has an  occluded RCA.  No medicine changes.  3.  Hx of CVA :   Having memory issues   4.  Hernia :   He has a history of chronic combined systolic and diastolic congestive heart failure.  He is very well compensated and is adequately tuned up for his surgery.  He is still at moderate to high risk because of his heart failure but there is no further cardiac work-up needed at this time.  We will send clearance to preop pool.   Medication Adjustments/Labs and Tests Ordered: Current medicines are reviewed at length with the patient today.  Concerns regarding medicines are outlined above.  No orders of the defined types were placed in this encounter.  No orders of the defined types were placed in this encounter.   Patient Instructions  Medication Instructions:  Your physician recommends that you continue on your current medications as directed. Please refer to the Current Medication list given to you today. *If you need a refill on your cardiac medications before your next appointment, please call your pharmacy*   Lab Work: none If you have labs (blood work) drawn today and your tests are completely normal, you will receive your results only by: Marland Kitchen MyChart Message (if you have MyChart) OR . A paper copy in the mail If you have any lab test that is abnormal or we need to change your treatment, we will call you to review the results.   Testing/Procedures: none   Follow-Up: At [facility], you and your health needs are our priority.  As part of our continuing mission to provide you with exceptional heart care, we have created designated Provider Care Teams.  These Care Teams include your primary Cardiologist (physician) and Advanced Practice  Providers (APPs -  Physician Assistants and Nurse Practitioners) who all work together to provide you with the care you need, when you need it.  We recommend signing up for the patient portal called "MyChart".  Sign up information is provided on this After  Visit Summary.  MyChart is used to connect with patients for Virtual Visits (Telemedicine).  Patients are able to view lab/test results, encounter notes, upcoming appointments, etc.  Non-urgent messages can be sent to your provider as well.   To learn more about what you can do with MyChart, go to [email or web address].    Your next appointment:   6 month(s)  The format for your next appointment:   In Person  Provider:   You may see  or one of the following Advanced Practice Providers on your designated Care Team:    Richardson Dopp, [place]  Other Instructions none     Signed, Mertie Moores, MD  10/31/2019 8:40 AM    [place]

## 2019-10-31 NOTE — Patient Instructions (Signed)
Medication Instructions:  Your physician recommends that you continue on your current medications as directed. Please refer to the Current Medication list given to you today. *If you need a refill on your cardiac medications before your next appointment, please call your pharmacy*   Lab Work: none If you have labs (blood work) drawn today and your tests are completely normal, you will receive your results only by: Marland Kitchen MyChart Message (if you have MyChart) OR . A paper copy in the mail If you have any lab test that is abnormal or we need to change your treatment, we will call you to review the results.   Testing/Procedures: none   Follow-Up: At [facility], you and your health needs are our priority.  As part of our continuing mission to provide you with exceptional heart care, we have created designated Provider Care Teams.  These Care Teams include your primary Cardiologist (physician) and Advanced Practice Providers (APPs -  Physician Assistants and Nurse Practitioners) who all work together to provide you with the care you need, when you need it.  We recommend signing up for the patient portal called "MyChart".  Sign up information is provided on this After Visit Summary.  MyChart is used to connect with patients for Virtual Visits (Telemedicine).  Patients are able to view lab/test results, encounter notes, upcoming appointments, etc.  Non-urgent messages can be sent to your provider as well.   To learn more about what you can do with MyChart, go to [email or web address].    Your next appointment:   6 month(s)  The format for your next appointment:   In Person  Provider:   You may see  or one of the following Advanced Practice Providers on your designated Care Team:    Richardson Dopp, [place]  Other Instructions none

## 2019-10-31 NOTE — Telephone Encounter (Signed)
Please inform the patient Dr. Acie Fredrickson cleared him for the surgery, however he will need to call [facility] at least 7 days prior to the surgery to discuss lovenox bridging so he can safely come off of coumadin prior to the procedure.   I have forwarded this clearance to the requesting provider

## 2019-11-03 NOTE — Telephone Encounter (Signed)
I called and spoke with patient, he is aware that Dr. Acie Fredrickson will clear for surgery and he is to follow up with [facility] at least 7 days prior for Lovenox bridging. Will forward this note to PCP as well. Patient verbalized understanding and thanked me for the call.

## 2019-11-06 ENCOUNTER — Other Ambulatory Visit: Payer: Self-pay

## 2019-11-06 ENCOUNTER — Other Ambulatory Visit: Payer: Medicare Other | Admitting: Hospice

## 2019-11-06 DIAGNOSIS — Z515 Encounter for palliative care: Secondary | ICD-10-CM

## 2019-11-06 DIAGNOSIS — J449 Chronic obstructive pulmonary disease, unspecified: Secondary | ICD-10-CM

## 2019-11-06 NOTE — Progress Notes (Signed)
[facility] Palliative Care Consult Note Telephone: [phone]  Fax: [phone] PATIENT NAME: Jared Stevens DOB: 1950-10-14 MRN: [id number]  PRIMARY CARE PROVIDER:   Sonia Side., FNP  REFERRING PROVIDER: Sonia Side., FNP   RESPONSIBLE PARTY:Self [phone]  MARTHA, SOLTYS, daughter [phone]    RECOMMENDATIONS/PLAN:  Advance Care Planning/Goals of Care: Inpatient visit consisted of building trust andf/u on palliative care and its benefits.   Patient is receptive of discussions on counseling and education dealing with the complex and emotionally intense issues of symptom management and palliative care in the setting of serious and potentially life-threatening illness. Patient said the desire to live is his motivation in effectively coping with his multple health challenges.  He shared he has 7 children and their different stations and live.  He lost his wife a few years ago.  Therapeutic listening and emotional support provided.  Patient remains a full code.Goals of care include to maximize quality of life and symptom management. Palliative care team will continue to support patient, patient's family, and medical team.  Symptom management:  Patient has unilateral recurrent inguinal hernia without obstruction or gangrene.  He reports he was seen at the hospital 10/23/2019 for interview; waiting for scheduling for surgical repair.  He denies pain/discomfort. No respiratory distress, no recent COPD flare; he denies edema. Numbness in bil handsis ongoing/chronic, not as bad as it used to be, continuing to take Vit B6 And B12; he continues to see his neurologist as planned every 6 months. He also sees his Oncologist every 6 months for Lung CA; had completed chemo/radiation; patient on remission. He goes for his weekly INR check inPCP's office, now goes once a month instead of  weekly.  He said overall he is doing well.  He is compliant with his medications Patient with no complaint or concerns at this time.  Encouraged ongoing care.  Follow CB:[id number] care will continue to follow patient for goals of care clarification and symptom management.  I spent  1 hour and 31minutes providing this consultation; time includes chart review and documentation. More than 50% of the time in this consultation was spent on coordinating communication.  HISTORY OF PRESENT ILLNESS:Jared Atkinsis a 69 y.o.year oldmalewith multiple medical problems including CVA, CHF, HLD, COPD, lung cancer. Palliative Care was asked to help address goals of care.  CODE STATUS:Full  PPS:60% HOSPICE ELIGIBILITY/DIAGNOSIS: TBD  PAST MEDICAL HISTORY:  Past Medical History:  Diagnosis Date  . Chronic obstructive pulmonary disease   . Combined systolic and diastolic CHF    Mixed Ischemic/Non-Ischemic CM // Echo 09/2018: EF 15 // cMRI 10/2018: EF 11, inf scar // Echo 12/2018: EF 20-25, Gr 3 DD // Not a candidate for ICD due to comorbid illnesses   . Coronary artery disease    cath 09/2018: RCA chronically occluded >> Med Rx  . 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 Hx of recurrent R pleural effusion    s/p thoracentesis  . Hypercholesteremia   . Lung cancer ([place]) 03/2016  . Mixed Ischemic and Non-Ischemic Cardiomyopathy   . Non-small cell carcinoma of lung   . Nonsustained ventricular tachycardia     SOCIAL HX:  Social History   Tobacco Use  . Smoking status: Current Some Day Smoker  . Smokeless tobacco: Never Used  Substance Use Topics  . Alcohol use: Not Currently    ALLERGIES: No Known Allergies  PERTINENT MEDICATIONS:  Outpatient Encounter Medications as of 11/06/2019  Medication Sig  . albuterol (VENTOLIN HFA) 108 (90 Base) MCG/ACT inhaler Inhale 1 puff into the lungs every 6 (six) hours as needed.  Marland Kitchen amitriptyline (ELAVIL)  50 MG tablet Take 50 mg by mouth at bedtime.  Marland Kitchen atorvastatin (LIPITOR) 80 MG tablet Take 1 tablet (80 mg total) by mouth daily.  . carvedilol (COREG) 3.125 MG tablet Take 1 tablet (3.125 mg total) by mouth 2 (two) times daily.  . digoxin (LANOXIN) 0.125 MG tablet TK 1 T PO D  . furosemide (LASIX) 40 MG tablet Take 1 tablet (40 mg total) by mouth daily.  Marland Kitchen ibuprofen (ADVIL) 800 MG tablet Take 800 mg by mouth 3 (three) times daily.  Marland Kitchen ipratropium (ATROVENT HFA) 17 MCG/ACT inhaler Administer two puffs into the lungs every six hours as needed for cough, congestion  . latanoprost (XALATAN) 0.005 % ophthalmic solution Place 1 drop into both eyes 2 (two) times daily.   . mometasone (ASMANEX) 220 MCG/INH inhaler Administer two puffs into the lungs twice a day for COPD Rinse after use. Shake inhaler before use  . spironolactone (ALDACTONE) 25 MG tablet Take 0.5 tablets (12.5 mg total) by mouth daily.  . SYMBICORT 160-4.5 MCG/ACT inhaler INL 2 PFS PO BID IN THE MORNING AND IN THE EVE  . warfarin (COUMADIN) 7.5 MG tablet Take 1 tablet (7.5 mg total) by mouth daily.   No facility-administered encounter medications on file as of 11/06/2019.    PHYSICAL EXAM/ROS:  General: NAD, frail appearing, thin Cardiovascular: regular rate and rhythm; denies chest pain Pulmonary: no adventitious lung sounds auscultated Abdomen: soft, nontender, + bowel sounds GU: no suprapubic tenderness Extremities: no edema, no joint deformities Skin: no rashes to exposed skin Neurological: Weakness but otherwise nonfocal  Teodoro Spray, NP

## 2019-11-20 ENCOUNTER — Ambulatory Visit: Payer: Medicare Other | Attending: Critical Care Medicine

## 2019-11-20 DIAGNOSIS — Z23 Encounter for immunization: Secondary | ICD-10-CM

## 2019-11-20 NOTE — Progress Notes (Signed)
   Covid-19 Vaccination Clinic  Name:  Jared Stevens    MRN: [id number] DOB: 1950-06-01  11/20/2019  [name] was observed post Covid-19 immunization for 15 min without incident. He was provided with Vaccine Information Sheet and instruction to access the V-Safe system.   [name] was instructed to call 911 with any severe reactions post vaccine: Marland Kitchen Difficulty breathing  . Swelling of face and throat  . A fast heartbeat  . A bad rash all over body  . Dizziness and weakness   Immunizations Administered    Name Date Dose VIS Date Route   Moderna COVID-19 Vaccine 11/20/2019 10:56 AM 0.5 mL 04/2019 Intramuscular   Manufacturer: Moderna   Lot: [id number]   [place]: [id number]

## 2019-12-01 ENCOUNTER — Other Ambulatory Visit: Payer: Medicare Other | Admitting: Hospice

## 2019-12-01 ENCOUNTER — Other Ambulatory Visit: Payer: Self-pay

## 2019-12-01 DIAGNOSIS — J449 Chronic obstructive pulmonary disease, unspecified: Secondary | ICD-10-CM

## 2019-12-01 DIAGNOSIS — Z515 Encounter for palliative care: Secondary | ICD-10-CM

## 2019-12-01 NOTE — Progress Notes (Signed)
[facility] Palliative Care Consult Note Telephone: [phone]  PATIENT NAME:Jared Stevens DOB:December 05, 1950 TMH:[id number]  PRIMARY CARE PROVIDER:Charlotte Ecea NP  REFERRING PROVIDER:Smith, Malva Limes., FNP   RESPONSIBLE PARTY:Self [phone]  Jared Stevens, Jared Stevens, daughter [phone]   RECOMMENDATIONS/PLAN:  Advance Care Planning/Goals of Care: Inpatient visit consisted of building trust andf/u on palliative care and its benefits.Ongoing discussions on counseling and education dealing with the complex and emotionally intense issues of symptom management and palliative care in the setting of serious and potentially life-threatening illness.  Patient shared that he is a man of faith, believing in afterlife, and trusting God to see him through his daily challenges.  Patient said the desire to live is his motivation in effectively coping with his multple health challenges.  He shared he has 7 children.  He lost his wife a few years ago. Therapeutic listening and emotional support provided. Patient remains a full code.  Discussions today on goals of care and education on full CODE STATUS and DO NOT RESUSCITATE status.  He remains a FULL code at this time. Goals of care include to maximize quality of life and symptom management. Palliative care team will continue to support patient, patient's family, and medical team.  Symptom management: Patient awaiting information on surgical repair for recurring unilateral inguinal hernia.  He denies pain/discomfort.No respiratory distress, no recent COPD flare; no edema. Numbness in bil handsis ongoing/chronic, not as bad as it used to be, continuing to take Vit B6 And B12;he continues to see his neurologist as planned every 6 months. He also sees his Oncologist every 6 months for Lung CA; had completed chemo/radiation; patient on remission.INR with normal  range. He is compliant with his medicationsPatient with no complaint or concerns at this time.Encouraged ongoing care. Follow JS:[id number] care will continue to follow patient for goals of care clarification and symptom management. I spent 1 hour and 27minutes providing this consultation; time includes chart review and documentation. More than 50% of the time in this consultation was spent on coordinating communication.  HISTORY OF PRESENT ILLNESS:Jared Atkinsis a 69 y.o.year oldmalewith multiple medical problems including CVA, CHF, HLD, COPD, lung cancer. Palliative Care was asked to help address goals of care.  CODE STATUS:Full  PPS:60% HOSPICE ELIGIBILITY/DIAGNOSIS: TBD  PAST MEDICAL HISTORY:  Past Medical History:  Diagnosis Date  . Chronic obstructive pulmonary disease   . Combined systolic and diastolic CHF    Mixed Ischemic/Non-Ischemic CM // Echo 09/2018: EF 15 // cMRI 10/2018: EF 11, inf scar // Echo 12/2018: EF 20-25, Gr 3 DD // Not a candidate for ICD due to comorbid illnesses   . Coronary artery disease    cath 09/2018: RCA chronically occluded >> Med Rx  . 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 Hx of recurrent R pleural effusion    s/p thoracentesis  . Hypercholesteremia   . Lung cancer ([place]) 03/2016  . Mixed Ischemic and Non-Ischemic Cardiomyopathy   . Non-small cell carcinoma of lung   . Nonsustained ventricular tachycardia     SOCIAL HX:  Social History   Tobacco Use  . Smoking status: Current Some Day Smoker  . Smokeless tobacco: Never Used  Substance Use Topics  . Alcohol use: Not Currently    ALLERGIES: No Known Allergies   PERTINENT MEDICATIONS:  Outpatient Encounter Medications as of 12/01/2019  Medication Sig  . albuterol (VENTOLIN HFA) 108 (90 Base) MCG/ACT inhaler Inhale 1  puff into the lungs every 6 (six) hours as needed.  Marland Kitchen amitriptyline (ELAVIL) 50 MG tablet Take 50 mg by mouth at  bedtime.  Marland Kitchen atorvastatin (LIPITOR) 80 MG tablet Take 1 tablet (80 mg total) by mouth daily.  . carvedilol (COREG) 3.125 MG tablet Take 1 tablet (3.125 mg total) by mouth 2 (two) times daily.  . digoxin (LANOXIN) 0.125 MG tablet TK 1 T PO D  . furosemide (LASIX) 40 MG tablet Take 1 tablet (40 mg total) by mouth daily.  Marland Kitchen ibuprofen (ADVIL) 800 MG tablet Take 800 mg by mouth 3 (three) times daily.  Marland Kitchen ipratropium (ATROVENT HFA) 17 MCG/ACT inhaler Administer two puffs into the lungs every six hours as needed for cough, congestion  . latanoprost (XALATAN) 0.005 % ophthalmic solution Place 1 drop into both eyes 2 (two) times daily.   . mometasone (ASMANEX) 220 MCG/INH inhaler Administer two puffs into the lungs twice a day for COPD Rinse after use. Shake inhaler before use  . spironolactone (ALDACTONE) 25 MG tablet Take 0.5 tablets (12.5 mg total) by mouth daily.  . SYMBICORT 160-4.5 MCG/ACT inhaler INL 2 PFS PO BID IN THE MORNING AND IN THE EVE  . warfarin (COUMADIN) 7.5 MG tablet Take 1 tablet (7.5 mg total) by mouth daily.   No facility-administered encounter medications on file as of 12/01/2019.    PHYSICAL EXAM/ROS  General: NAD, frail appearing, thin Cardiovascular: regular rate and rhythm; denies chest pain Pulmonary: clear ant fields; no adventitious sounds auscultated Abdomen: soft, nontender, + bowel sounds GU: no suprapubic tenderness Extremities: no edema, no joint deformities Skin: no rashes to exposed skin Neurological: Weakness but otherwise nonfocal  Teodoro Spray, NP

## 2019-12-04 IMAGING — CT CT CHEST WITH CONTRAST
2 of 3 series · 15 of 36 positions shown, 18 images · IV contrast (omnipaque)
Comparison: 05/23/2018 PET-CT.  04/05/2018 chest CT.

CLINICAL DATA: Stage IIIA right lung adenocarcinoma status post
chemotherapy and radiation therapy completed early 3456. Interval
observation. Restaging.

EXAM:
CT CHEST WITH CONTRAST
TECHNIQUE: Multidetector CT imaging of the chest was performed during
intravenous contrast administration.
CONTRAST:  75mL OMNIPAQUE IOHEXOL 300 MG/ML  SOLN

[Series 2: axial st · axial · 0.70mm/px · z∈[+410,+728]mm · 12 of 187 slices shown, 15 images]
[im 14/187  mediastinal]
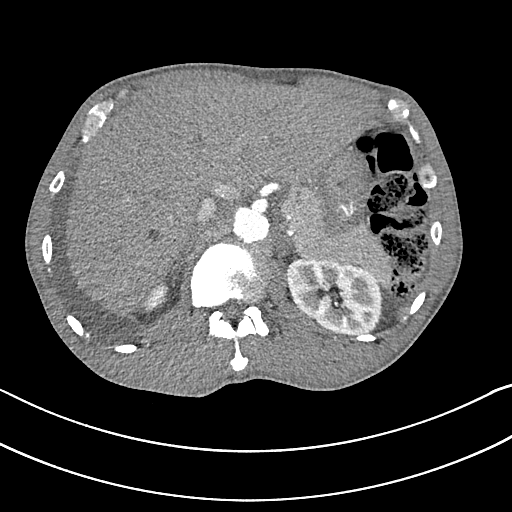
[im 14/187  lung]
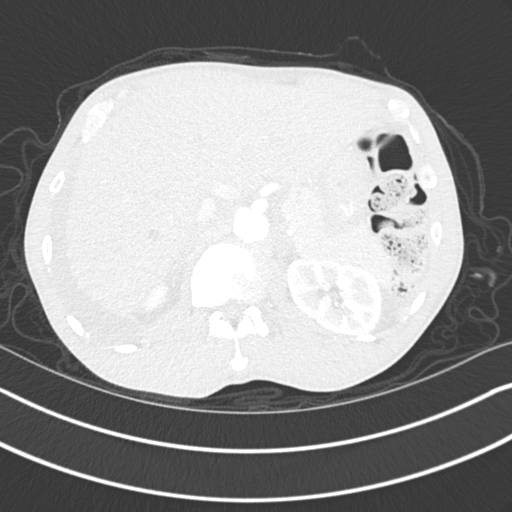
[im 28/187  lung]
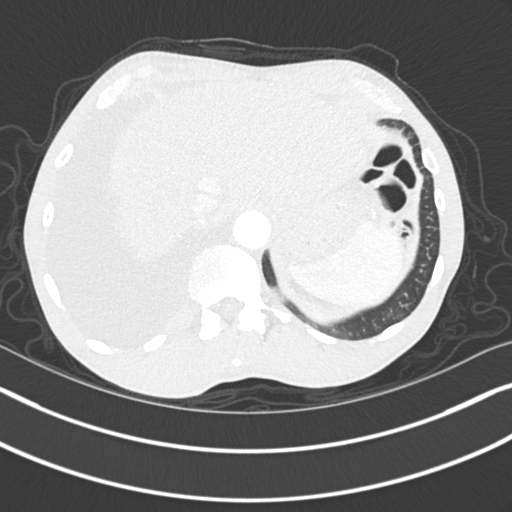
[im 42/187  lung]
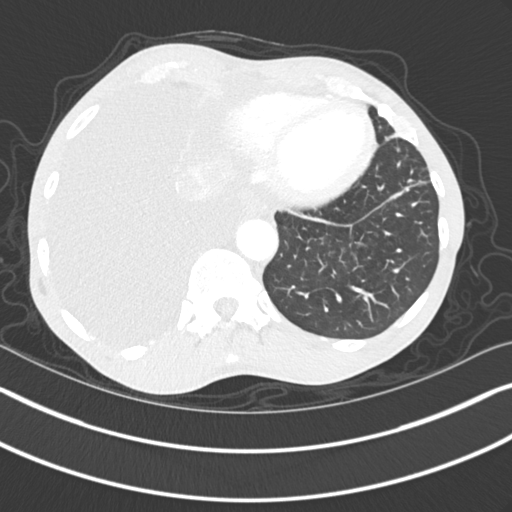
[im 56/187  lung]
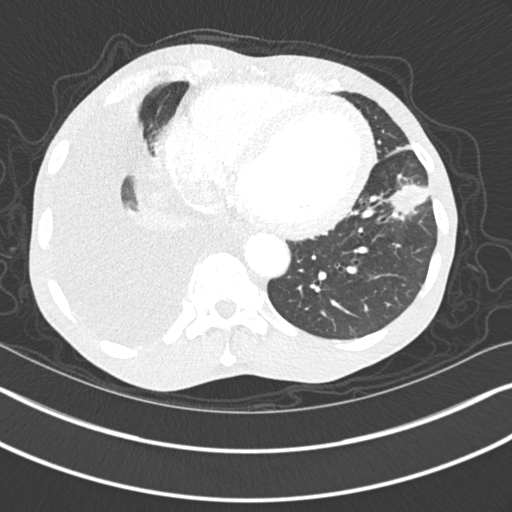
[im 69/187  mediastinal]
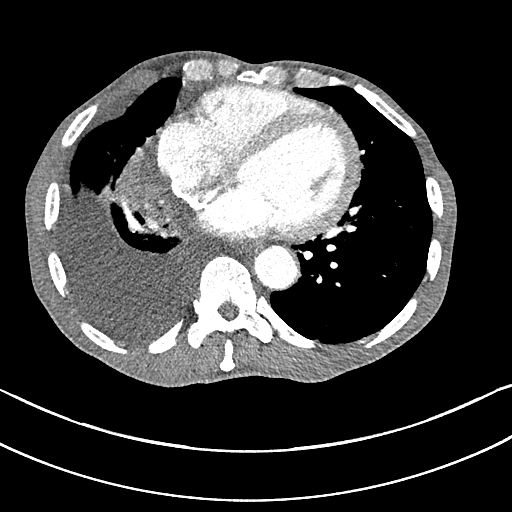
[im 69/187  lung]
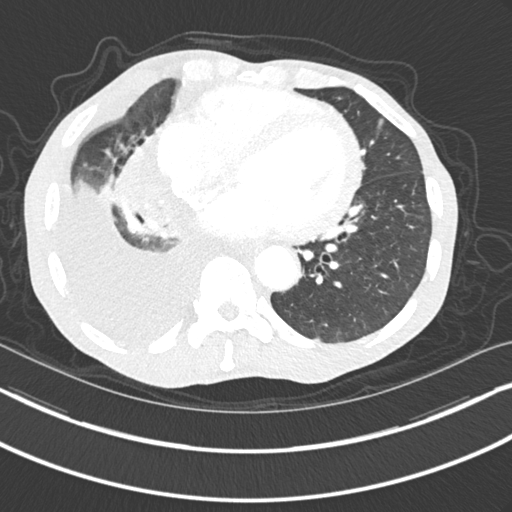
[im 83/187  lung]
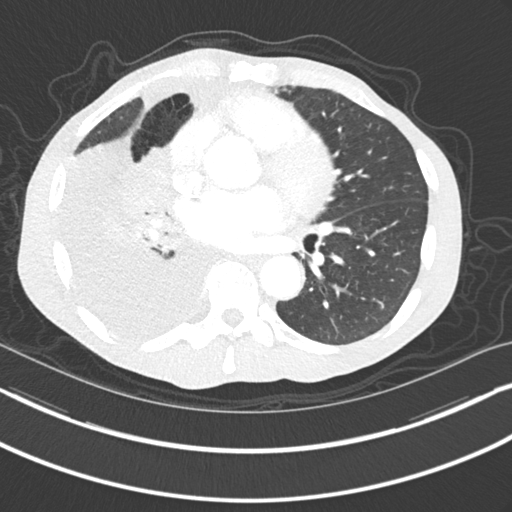
[im 104/187  lung]
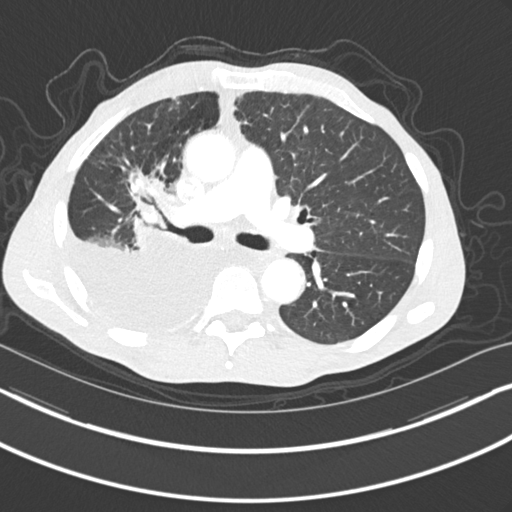
[im 118/187  lung]
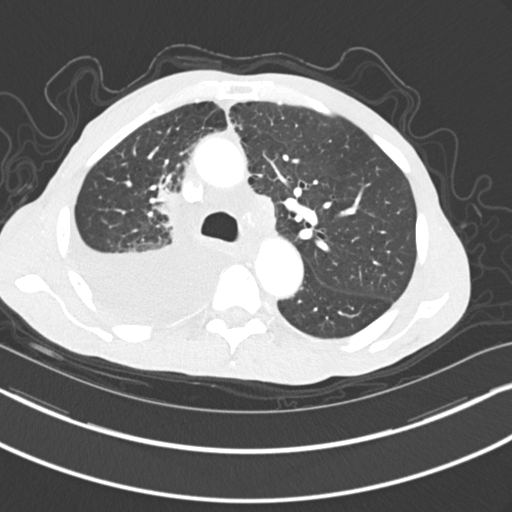
[im 131/187  mediastinal]
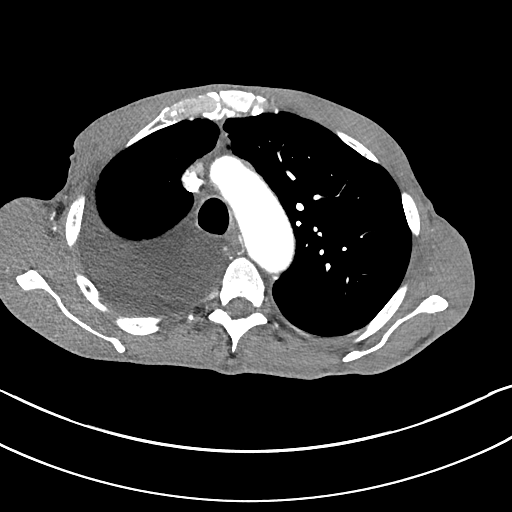
[im 131/187  lung]
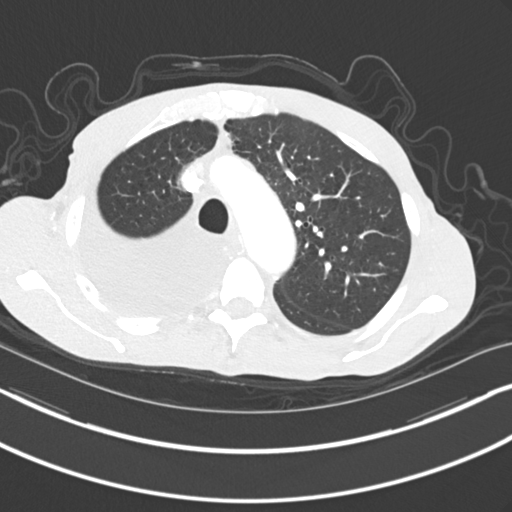
[im 145/187  lung]
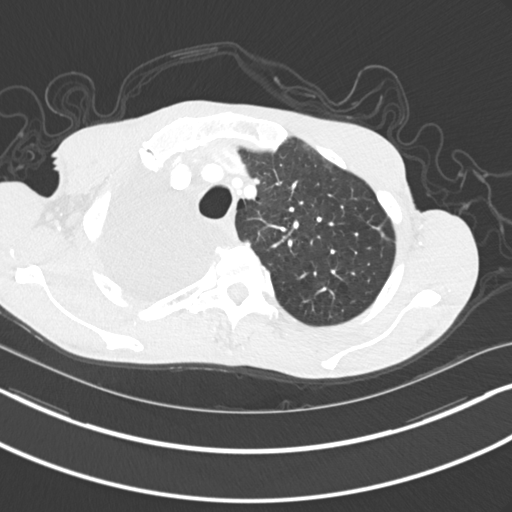
[im 159/187  lung]
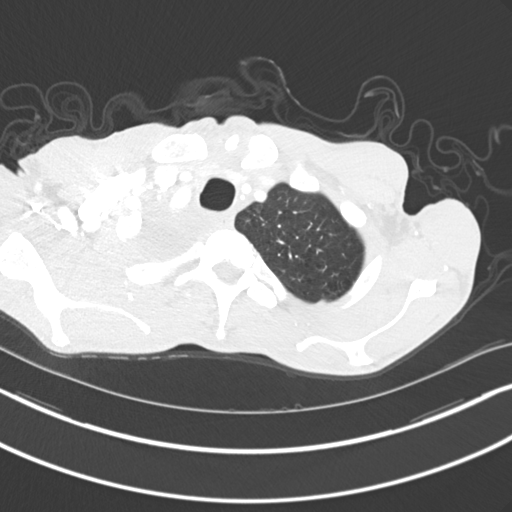
[im 173/187  lung]
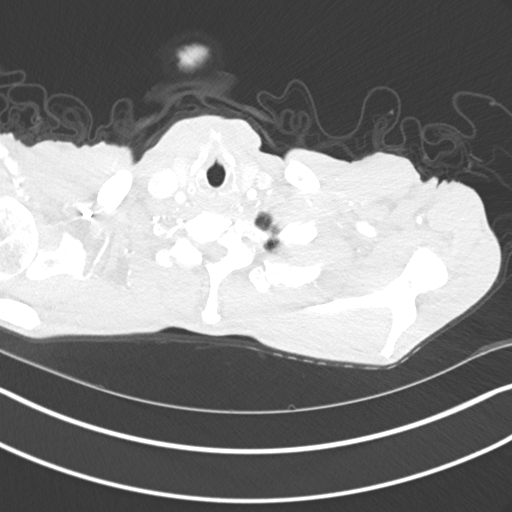

[Series 6: coronal · coronal · 0.72mm/px · 3 of 134 slices shown]
[im 27/134  lung]
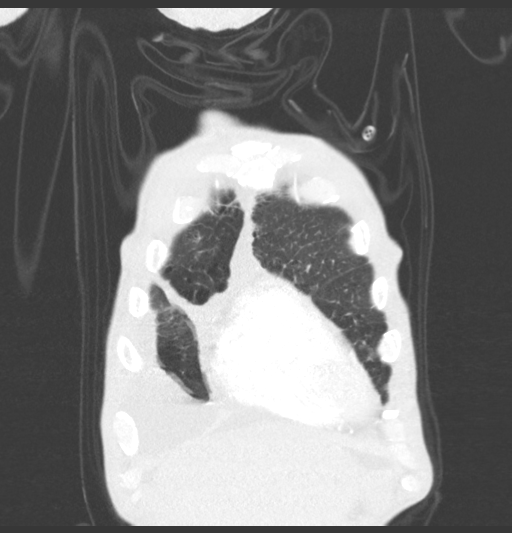
[im 54/134  lung]
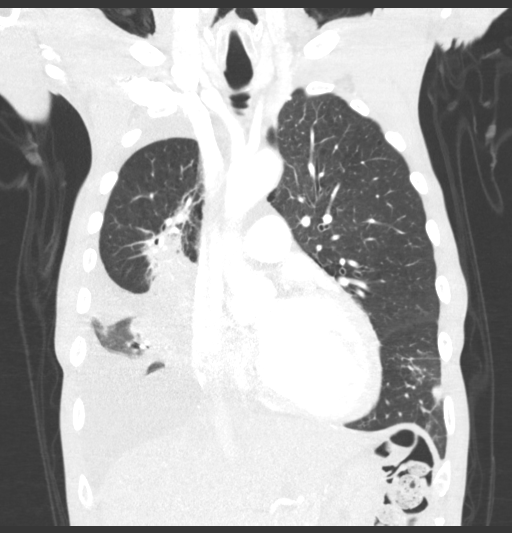
[im 80/134  lung]
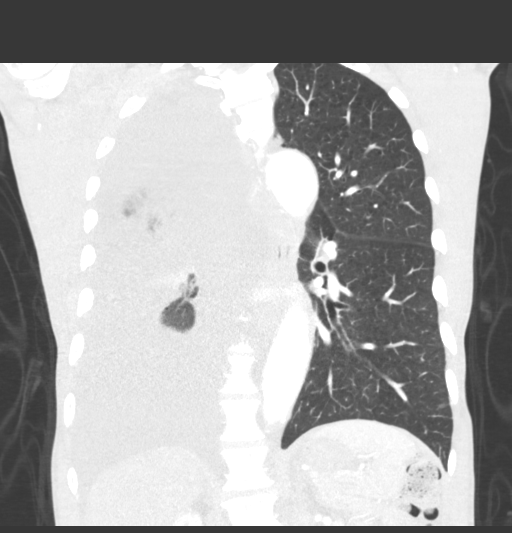

[15 of 36 positions shown; findings below may reference images not displayed]

FINDINGS: Cardiovascular: Top-normal heart size. No significant pericardial
effusion/thickening. Atherosclerotic nonaneurysmal thoracic aorta.
Normal caliber pulmonary arteries. No central pulmonary emboli.

Mediastinum/Nodes: No discrete thyroid nodules. Unremarkable
esophagus. No pathologically enlarged axillary, mediastinal or hilar
lymph nodes.

Lungs/Pleura: No pneumothorax. Large dependent right pleural
effusion increased from 05/23/2018 PET-CT. No left pleural effusion.
Moderate centrilobular emphysema. Nodular subpleural medial right
upper lobe 1.1 cm focus of consolidation (series 5/image 95), stable
since 04/05/2018 chest CT. Increased compressive atelectasis
throughout the dependent right lung. Masslike sharply marginated
right perihilar consolidation with associated volume loss and
distortion, similar. The previously described lower-attenuation
x 3.5 cm right middle lobe masslike focus of consolidation measures
4.4 x 3.3 cm (series 2/image 109), not appreciably changed. New
x 2.1 cm centrally cavitary masslike focus of consolidation in the
anterior left lower lobe (series 5/image 130). No additional
significant pulmonary nodules.

Upper abdomen: Scattered subcentimeter hypodense liver lesions, too
small to characterize, not appreciably changed. Subcentimeter
hypodense posterior upper left renal cortical lesion, too small to
characterize, unchanged.

Musculoskeletal: No aggressive appearing focal osseous lesions. Mild
thoracic spondylosis.
IMPRESSION: 1. New centrally cavitary 3.3 cm masslike focus of consolidation in
the anterior left lower lung lobe. Given the rapid appearance since
05/23/2018 PET-CT, cavitary infection is favored, although neoplasm
is not excluded. Recommend attention on short-term follow-up chest
CT in 1-2 months.
2. Large right pleural effusion, increased.
3. Masslike post treatment changes in the perihilar right lung are
not appreciably changed.
4. No discrete thoracic adenopathy.

Aortic Atherosclerosis (7W75Z-2FC.C) and Emphysema (7W75Z-WK4.8).

## 2019-12-10 ENCOUNTER — Telehealth: Payer: Self-pay | Admitting: Medical Oncology

## 2019-12-10 NOTE — Telephone Encounter (Signed)
appts confirmed with pt for sept labs and f/u.  Pt asked me to arrange transportation for him . I sent e-mail to transportation with the dates. The CT is not yet scheduled.

## 2019-12-21 ENCOUNTER — Telehealth: Payer: Self-pay | Admitting: Cardiology

## 2019-12-21 NOTE — Telephone Encounter (Signed)
Patient called stating that he is out of his Warfarin.  His PCP manages his INRs and he tells me he was dismissed from their practice.  I have no record of his INRs.  He took his last dose on Friday and tells me that he does not take warfarin on Sat or Sundays.  He is on warfarin for CVA.    I have instructed him that since we do not follow his INRs we do not have a record of prior INRs.  He will need to come into the office om Monday to be seen in Coumadin clinic.  I am forwarding this to Dr. Acie Fredrickson as he will need to follow his INRs until he gets a new PCP.  Please call patient and set up Coumadin clinic tomorrow

## 2019-12-22 ENCOUNTER — Telehealth: Payer: Self-pay | Admitting: Cardiovascular Disease

## 2019-12-22 MED ORDER — WARFARIN SODIUM 7.5 MG PO TABS: 7.5000 mg | ORAL_TABLET | Freq: Every day | ORAL | 0 refills | Status: DC

## 2019-12-22 NOTE — Telephone Encounter (Signed)
Patient had appointment with PCP on 8/5. Not established there yet and [facility] does not cover that office.  Patient states he cannot come into the office. Does not have a way to get here. Does have [facility] coming to his house. But wont be there until Thursday.  Per Dr. Acie Fredrickson - place orders with [facility] to have them check INR.  I called [facility] and left a message for them to call back Sent in Rx for 14 days of warfarin

## 2019-12-22 NOTE — Addendum Note (Signed)
Addended by: Marcelle Overlie D on: 12/22/2019 01:12 PM   Modules accepted: Orders

## 2019-12-22 NOTE — Telephone Encounter (Signed)
Left VM for patient to call back to schedule appointment

## 2019-12-22 NOTE — Telephone Encounter (Signed)
*  STAT* If patient is at the pharmacy, call can be transferred to refill team.   1. Which medications need to be refilled? (please list name of each medication and dose if known)? warfarin (COUMADIN) 7.5 MG tablet  2. Which pharmacy/location (including street and city if local pharmacy) is medication to be sent to? [place], [place]  3. Do they need a 30 day or 90 day supply? 90 day

## 2019-12-22 NOTE — Telephone Encounter (Signed)
Spoke with patient. He wants to be followed by his PCP. Already scheduled with [facility] next week. I will send to [facility] to set up INR check and refill warfarin (pt is out) since she will be the one to follow his INRs.

## 2019-12-25 NOTE — Telephone Encounter (Signed)
I called [facility] who states they do not check patients INR. I called patients previous PCP who states they did not set up any home health services for patient.  We will try to arrange for transport to appointment in clinic instead. I have called and left message for patient to call back.

## 2019-12-30 NOTE — Telephone Encounter (Signed)
Patient did not return call. Since his appointment is with his PCP in 2 days, I will leave it to PCP to check INR and set up monitoring. Will close out enounter.

## 2020-01-01 ENCOUNTER — Other Ambulatory Visit: Payer: Self-pay

## 2020-01-01 ENCOUNTER — Ambulatory Visit (INDEPENDENT_AMBULATORY_CARE_PROVIDER_SITE_OTHER): Payer: Medicare Other | Admitting: Nurse Practitioner

## 2020-01-01 ENCOUNTER — Encounter: Payer: Self-pay | Admitting: Nurse Practitioner

## 2020-01-01 VITALS — BP 102/68 | HR 82 | Temp 97.8°F | Ht 73.0 in | Wt 138.0 lb

## 2020-01-01 DIAGNOSIS — R29818 Other symptoms and signs involving the nervous system: Secondary | ICD-10-CM | POA: Diagnosis not present

## 2020-01-01 DIAGNOSIS — E7841 Elevated Lipoprotein(a): Secondary | ICD-10-CM | POA: Diagnosis not present

## 2020-01-01 DIAGNOSIS — Z7901 Long term (current) use of anticoagulants: Secondary | ICD-10-CM | POA: Insufficient documentation

## 2020-01-01 DIAGNOSIS — R4189 Other symptoms and signs involving cognitive functions and awareness: Secondary | ICD-10-CM | POA: Diagnosis not present

## 2020-01-01 LAB — PROTIME-INR
INR: 3.3 ratio — ABNORMAL HIGH (ref 0.8–1.0)
Prothrombin Time: 36.2 s — ABNORMAL HIGH (ref 9.6–13.1)

## 2020-01-01 LAB — CBC
HCT: 45 % (ref 39.0–52.0)
Hemoglobin: 14.8 g/dL (ref 13.0–17.0)
MCHC: 33 g/dL (ref 30.0–36.0)
MCV: 89.3 fl (ref 78.0–100.0)
Platelets: 297 10*3/uL (ref 150.0–400.0)
RBC: 5.03 Mil/uL (ref 4.22–5.81)
RDW: 16.6 % — ABNORMAL HIGH (ref 11.5–15.5)
WBC: 6.5 10*3/uL (ref 4.0–10.5)

## 2020-01-01 LAB — HEPATIC FUNCTION PANEL
ALT: 17 U/L (ref 0–53)
AST: 21 U/L (ref 0–37)
Albumin: 3.8 g/dL (ref 3.5–5.2)
Alkaline Phosphatase: 73 U/L (ref 39–117)
Bilirubin, Direct: 0.1 mg/dL (ref 0.0–0.3)
Total Bilirubin: 0.4 mg/dL (ref 0.2–1.2)
Total Protein: 6.8 g/dL (ref 6.0–8.3)

## 2020-01-01 LAB — BASIC METABOLIC PANEL
BUN: 18 mg/dL (ref 6–23)
CO2: 28 mEq/L (ref 19–32)
Calcium: 9.3 mg/dL (ref 8.4–10.5)
Chloride: 106 mEq/L (ref 96–112)
Creatinine, Ser: 0.94 mg/dL (ref 0.40–1.50)
GFR: 96.35 mL/min (ref >60.00)
Glucose, Bld: 123 mg/dL — ABNORMAL HIGH (ref 70–99)
Potassium: 3.7 mEq/L (ref 3.5–5.1)
Sodium: 139 mEq/L (ref 135–145)

## 2020-01-01 LAB — APTT: aPTT: 53.5 s — ABNORMAL HIGH (ref 23.4–32.7)

## 2020-01-01 NOTE — Patient Instructions (Addendum)
Please sign medical release form to get records from Previous pcp with [facility].  Stable renal and liver function. Stable cbc INR of 3.3. Hold coumadin for 1day, then resume at 6mg  daily. Schedule appt with Coumadin clinic at [facility] in 1week. Coumadin will be managed by our nurse at [facility] office: [place].  Have [facility] form faxed to me or dropped at front desk.

## 2020-01-01 NOTE — Progress Notes (Signed)
Subjective:  Patient ID: Jared Stevens, male    DOB: 08/12/1950  Age: 69 y.o. MRN: [id number]  CC: Establish Care, Cerebrovascular Accident [facility] was doing his PT/INR. States he needs refill of coumadin), and Hernia  HPI Transfer from [facility], last OV 08/2019 per patient. Unaccompanied today, use of [facility] bus per Patient, states he needs form completed but did not bring it. He is single and lives alone, has 9children whom all live out of state. Has a daughter in [place] whom is involved in his care. He also has a brother and uncle (both in poor health) whom live in [place].  Heath care team: Cardiology: Dr. Acie Fredrickson Neurology: Dr. Leonie Man Oncology: Dr. Earlie Server  Neurocognitive deficits He reports short term memory difficulty since CVA 09/2018.  Last OV with neurology 05/2019: Dr. Leonie Man. Recommended f/up 05/2020   Long term current use of anticoagulant therapy Post embolic CVA 83/3825, treated with TPA and thrombectomy. Coumadin previous managed by [facility]. He does not remember last PT/INR check. Current use of 7.5mg  INR goal of 2-3 Denies any melena or hematochezia or hematuria or hematoma or nausea or ABD pain or fall.  Stable renal and liver function. Stable cbc INR of 3.3. Hold coumadin for 1day, then resume at 6mg  daily. New rx sent Schedule appt with Coumadin clinic at [facility] in 1week.   Reviewed past Medical, Social and Family history today.  Outpatient Medications Prior to Visit  Medication Sig Dispense Refill  . amitriptyline (ELAVIL) 50 MG tablet Take 50 mg by mouth at bedtime.    . Cyanocobalamin (B-12) 2500 MCG SUBL Place under the tongue.    . furosemide (LASIX) 40 MG tablet Take 1 tablet (40 mg total) by mouth daily. 30 tablet 2  . latanoprost (XALATAN) 0.005 % ophthalmic solution Place 1 drop into both eyes 2 (two) times daily.   11  . spironolactone (ALDACTONE) 25 MG tablet Take 0.5 tablets (12.5 mg total) by mouth daily. 15 tablet 2  .  SYMBICORT 160-4.5 MCG/ACT inhaler INL 2 PFS PO BID IN THE MORNING AND IN THE EVE    . digoxin (LANOXIN) 0.125 MG tablet TK 1 T PO D    . warfarin (COUMADIN) 7.5 MG tablet Take 1 tablet (7.5 mg total) by mouth daily. (Patient taking differently: Take 7.5 mg by mouth daily. 2 on Mon and Friday, 1 on Tues, Wed, Thurs, Skip Sat & Sun.) 14 tablet 0  . albuterol (VENTOLIN HFA) 108 (90 Base) MCG/ACT inhaler Inhale 1 puff into the lungs every 6 (six) hours as needed. (Patient not taking: Reported on 01/01/2020)    . atorvastatin (LIPITOR) 80 MG tablet Take 1 tablet (80 mg total) by mouth daily. 90 tablet 3  . carvedilol (COREG) 3.125 MG tablet Take 1 tablet (3.125 mg total) by mouth 2 (two) times daily. 180 tablet 3  . ipratropium (ATROVENT HFA) 17 MCG/ACT inhaler Administer two puffs into the lungs every six hours as needed for cough, congestion (Patient not taking: Reported on 01/01/2020)    . mometasone (ASMANEX) 220 MCG/INH inhaler Administer two puffs into the lungs twice a day for COPD Rinse after use. Shake inhaler before use (Patient not taking: Reported on 01/01/2020)    . ibuprofen (ADVIL) 800 MG tablet Take 800 mg by mouth 3 (three) times daily.     No facility-administered medications prior to visit.    ROS See HPI  Objective:  BP 102/68 (BP Location: Left Arm, Patient Position: Sitting, Cuff  Size: Normal)   Pulse 82   Temp 97.8 F (36.6 C)   Ht 6\' 1"  (1.854 m)   Wt 138 lb (62.6 kg)   SpO2 96%   BMI 18.21 kg/m   Physical Exam Vitals reviewed.  Cardiovascular:     Rate and Rhythm: Normal rate. Rhythm irregular.     Pulses: Normal pulses.  Pulmonary:     Effort: Pulmonary effort is normal.     Breath sounds: Normal breath sounds.  Musculoskeletal:     Right lower leg: No edema.     Left lower leg: No edema.  Neurological:     Mental Status: He is alert and oriented to person, place, and time.     Gait: Gait abnormal.  Psychiatric:        Mood and Affect: Mood normal.         Speech: Speech normal.        Behavior: Behavior normal.        Thought Content: Thought content normal.        Cognition and Memory: Cognition and memory normal.    Assessment & Plan:  This visit occurred during the SARS-CoV-2 public health emergency.  Safety protocols were in place, including screening questions prior to the visit, additional usage of staff PPE, and extensive cleaning of exam room while observing appropriate contact time as indicated for disinfecting solutions.   Sohil was seen today for establish care, cerebrovascular accident and hernia.  Diagnoses and all orders for this visit:  Elevated lipoprotein(a) -     Hepatic function panel  Long term current use of anticoagulant therapy -     Protime-INR -     Basic metabolic panel -     PTT -     CBC -     warfarin (COUMADIN) 6 MG tablet; Take 1 tablet (6 mg total) by mouth daily.  Neurocognitive deficits    Problem List Items Addressed This Visit      Nervous and Auditory   Neurocognitive deficits    He reports short term memory difficulty since CVA 09/2018.  Last OV with neurology 05/2019: Dr. Leonie Man. Recommended f/up 05/2020         Other   Hyperlipidemia - Primary   Relevant Medications   warfarin (COUMADIN) 6 MG tablet   Other Relevant Orders   Hepatic function panel (Completed)   Long term current use of anticoagulant therapy    Post embolic CVA 86/3817, treated with TPA and thrombectomy. Coumadin previous managed by [facility]. He does not remember last PT/INR check. Current use of 7.5mg  INR goal of 2-3 Denies any melena or hematochezia or hematuria or hematoma or nausea or ABD pain or fall.  Stable renal and liver function. Stable cbc INR of 3.3. Hold coumadin for 1day, then resume at 6mg  daily. New rx sent Schedule appt with Coumadin clinic at [facility] in 1week.      Relevant Medications   warfarin (COUMADIN) 6 MG tablet   Other Relevant Orders   Protime-INR (Completed)    Basic metabolic panel (Completed)   PTT (Completed)   CBC (Completed)      I have spent 51mins with this patient regarding history taking, documentation, review of labs, radiology, specialty notes-neurology and cardiology, formulating plan and discussing treatment options with patient.  Follow-up: No follow-ups on file.  Wilfred Lacy, NP

## 2020-01-01 NOTE — Assessment & Plan Note (Addendum)
He reports short term memory difficulty since CVA 09/2018.  Last OV with neurology 05/2019: Dr. Leonie Man. Recommended f/up 05/2020

## 2020-01-01 NOTE — Assessment & Plan Note (Addendum)
Post embolic CVA 12/8108, treated with TPA and thrombectomy. Coumadin previous managed by [facility]. He does not remember last PT/INR check. Current use of 7.5mg  INR goal of 2-3 Denies any melena or hematochezia or hematuria or hematoma or nausea or ABD pain or fall.  Stable renal and liver function. Stable cbc INR of 3.3. Hold coumadin for 1day, then resume at 6mg  daily. New rx sent Schedule appt with Coumadin clinic at [facility] in 1week.

## 2020-01-02 ENCOUNTER — Telehealth: Payer: Self-pay | Admitting: Nurse Practitioner

## 2020-01-02 MED ORDER — WARFARIN SODIUM 6 MG PO TABS: 6.0000 mg | ORAL_TABLET | Freq: Every day | ORAL | 0 refills | Status: DC

## 2020-01-02 NOTE — Telephone Encounter (Signed)
Informed Jared Stevens about lab results and change in coumadin dose. I made him aware about need for Nurse visit in 1week for repeat INR. He will be contacted by front dest to schedule nurse visit. He verbalized understanding

## 2020-01-02 NOTE — Telephone Encounter (Signed)
Pt calling in because he said that he read over his after visit summary and said Baldo Ash put on there about him taking a medication differently, He said he was concerned about this and wanted to talk to [place], please advise

## 2020-01-02 NOTE — Telephone Encounter (Signed)
Patient states he spoke with Jared Stevens regarding his hernia repair. He said Dr. Marlou Starks at [facility] is going to do the repair but the patient is suppose to get a shot twice daily for 5 days. Patient is unaware of what the shot is, where to get it or how to use it. Please call the patient to discuss.

## 2020-01-05 ENCOUNTER — Other Ambulatory Visit: Payer: Medicare Other | Admitting: Hospice

## 2020-01-05 ENCOUNTER — Other Ambulatory Visit: Payer: Self-pay

## 2020-01-05 DIAGNOSIS — Z515 Encounter for palliative care: Secondary | ICD-10-CM

## 2020-01-05 DIAGNOSIS — J449 Chronic obstructive pulmonary disease, unspecified: Secondary | ICD-10-CM

## 2020-01-05 NOTE — Telephone Encounter (Signed)
Pt scheduled nurse visit on 12/28/19 at 10:20am.

## 2020-01-05 NOTE — Progress Notes (Signed)
[facility] Pall[facility] Consult Note Telephone: [phone]  Fax: [phone]  PATIENT NAME:Jared Stevens DOB:13-Feb-1951 KVQ:[id number]  PRIMARY CARE PROVIDER:Charlotte [name] NP  REFERRING PROVIDER:Smith, Malva Limes., FNP   RESPONSIBLE PARTY:Self [phone] Jared Stevens, daughter [phone]   RECOMMENDATIONS/PLAN:  Advance Care Planning/Goals of Care:Patient  remains a FULL code. Visitconsisted of building trust andf/u on palliative care. Ongoing discussions oncounseling and education dealing with the complex and emotionally intense issues of symptom management and palliative care in the setting of serious and potentially life-threatening illness.  Patient shared that he is a man of faith; his faith helps him to life's difficulties and challenges.  Validation and emotional support provided.  NP broached the difference between full code and DO NOT RESUSCITATE explaining that DNR does not mean no treatment from PCP or other specialists.  Will further discuss next visit; patient in agreement.  Goals of care include to maximize quality of life and symptom management. Palliative care team will continue to support patient, patient's family, and medical team.  Follow WF:[id number] care will continue to follow patient for goals of care clarification and symptom management.Next visit in two months.  Symptom management:Patient saw newly established PCP with [facility] last week who reduced his Warfarin dose. He denies bleeding, no palpitations. Patient awaiting information on surgical repair for recurring unilateral inguinal hernia; likely repair will be in September '21. He denies pain/discomfort.No respiratory distress, no recent COPD flare; no edema; he is continuing to take his breathing treatments as ordered,  Vit B6 And B12. He also sees his Oncologist every 6 months for Lung CA; had completed chemo/radiation; patient on  remission. He reports intermittent short term memory difficulty since CVA 09/2018. He is compliant with his medications.Patient with no acute complaint or concerns at this time.Encouraged ongoing care.  I spent1 hour and 16 minutes providing this consultation;time includes chart review and documentation. More than 50% of the time in this consultation was spent on coordinating communication.  HISTORY OF PRESENT ILLNESS:Jared Atkinsis a 69 y.o.year oldmalewith multiple medical problems including CVA, CHF, HLD, COPD, lung cancer. Palliative Care was asked to help address goals of care.  CODE STATUS:Full  PPS:60%HOSPICE ELIGIBILITY/DIAGNOSIS: TBD  PAST MEDICAL HISTORY:  Past Medical History:  Diagnosis Date  . Chronic obstructive pulmonary disease   . Combined systolic and diastolic CHF    Mixed Ischemic/Non-Ischemic CM // Echo 09/2018: EF 15 // cMRI 10/2018: EF 11, inf scar // Echo 12/2018: EF 20-25, Gr 3 DD // Not a candidate for ICD due to comorbid illnesses   . Coronary artery disease    cath 09/2018: RCA chronically occluded >> Med Rx  . 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 Hx of recurrent R pleural effusion    s/p thoracentesis  . Hypercholesteremia   . Lung cancer ([place]) 03/2016  . Mixed Ischemic and Non-Ischemic Cardiomyopathy   . Non-small cell carcinoma of lung   . Nonsustained ventricular tachycardia     SOCIAL HX:  Social History   Tobacco Use  . Smoking status: Current Some Day Smoker  . Smokeless tobacco: Never Used  Substance Use Topics  . Alcohol use: Not Currently    ALLERGIES: No Known Allergies   PERTINENT MEDICATIONS:  Outpatient Encounter Medications as of 01/05/2020  Medication Sig  . albuterol (VENTOLIN HFA) 108 (90 Base) MCG/ACT inhaler Inhale 1 puff into the lungs every 6 (six) hours as needed. (Patient  not taking: Reported on 01/01/2020)  . amitriptyline (ELAVIL) 50 MG tablet Take 50 mg by  mouth at bedtime.  Marland Kitchen atorvastatin (LIPITOR) 80 MG tablet Take 1 tablet (80 mg total) by mouth daily.  . carvedilol (COREG) 3.125 MG tablet Take 1 tablet (3.125 mg total) by mouth 2 (two) times daily.  . Cyanocobalamin (B-12) 2500 MCG SUBL Place under the tongue.  . furosemide (LASIX) 40 MG tablet Take 1 tablet (40 mg total) by mouth daily.  Marland Kitchen ipratropium (ATROVENT HFA) 17 MCG/ACT inhaler Administer two puffs into the lungs every six hours as needed for cough, congestion (Patient not taking: Reported on 01/01/2020)  . latanoprost (XALATAN) 0.005 % ophthalmic solution Place 1 drop into both eyes 2 (two) times daily.   . mometasone (ASMANEX) 220 MCG/INH inhaler Administer two puffs into the lungs twice a day for COPD Rinse after use. Shake inhaler before use (Patient not taking: Reported on 01/01/2020)  . spironolactone (ALDACTONE) 25 MG tablet Take 0.5 tablets (12.5 mg total) by mouth daily.  . SYMBICORT 160-4.5 MCG/ACT inhaler INL 2 PFS PO BID IN THE MORNING AND IN THE EVE  . warfarin (COUMADIN) 6 MG tablet Take 1 tablet (6 mg total) by mouth daily.   No facility-administered encounter medications on file as of 01/05/2020.    PHYSICAL EXAM/ROS:  General: NAD, frail appearing, thin, cooperative Cardiovascular: regular rate and rhythm; denies chest pain Pulmonary: No coughing, no shortness of breath Abdomen: soft, nontender, + bowel sounds GU: no suprapubic tenderness Extremities: no edema, no joint deformities Skin: no rashes to exposed skin Neurological: Weakness but otherwise nonfocal  Teodoro Spray, NP

## 2020-01-06 ENCOUNTER — Ambulatory Visit: Payer: Medicare Other

## 2020-01-07 ENCOUNTER — Other Ambulatory Visit: Payer: Self-pay

## 2020-01-08 ENCOUNTER — Telehealth: Payer: Self-pay | Admitting: Nurse Practitioner

## 2020-01-08 ENCOUNTER — Ambulatory Visit (INDEPENDENT_AMBULATORY_CARE_PROVIDER_SITE_OTHER): Payer: Medicare Other

## 2020-01-08 DIAGNOSIS — I6601 Occlusion and stenosis of right middle cerebral artery: Secondary | ICD-10-CM

## 2020-01-08 DIAGNOSIS — Z7901 Long term (current) use of anticoagulants: Secondary | ICD-10-CM

## 2020-01-08 HISTORY — DX: Long term (current) use of anticoagulants: Z79.01

## 2020-01-08 LAB — POCT INR: INR: 2.5 (ref 2.0–3.0)

## 2020-01-08 MED ORDER — WARFARIN SODIUM 6 MG PO TABS: ORAL_TABLET | ORAL | 0 refills | Status: DC

## 2020-01-08 NOTE — Telephone Encounter (Signed)
Form completed. He has to sign front page before form can be faxed. He can take care of this when he return for INR appt next week.

## 2020-01-08 NOTE — Progress Notes (Signed)
Medical screening examination/treatment/procedure(s) were performed by the  RN. As primary care provider I was immediately available for consulation/collaboration. I agree with above documentation. New coumadin rx sent Wilfred Lacy, AGNP-C

## 2020-01-08 NOTE — Progress Notes (Signed)
Pt here for INR check per  Goal INR = 2.0 -3.0  Last INR = 3.3  Pt currently takes Coumadin 6 mg daily   Date of last Coumadin dose = 01/08/2020  Pt denies recent antibiotics, no dietary changes and no unusual bruising / bleeding.  INR today = 2.5  Pt advised per Joya Martyr, to take Coumadin 9 mg Thursday, Friday, Saturday and to take Coumadin 6 mg Sunday, Monday, Tuesday, Wednesday. Patient advised to take an additional 3 mg today to meet Coumadin dosing  requirement. Repeat INR check in 1 week.

## 2020-01-08 NOTE — Addendum Note (Signed)
Addended by: Wilfred Lacy L on: 01/08/2020 12:18 PM   Modules accepted: Orders

## 2020-01-08 NOTE — Telephone Encounter (Signed)
I tried to call pt to make aware but was unable to get in touch

## 2020-01-08 NOTE — Telephone Encounter (Signed)
Pt dropped off form for [facility] to fill out, I put in [employer] folder up front. Pt request it be faxed to [phone], Attention to [place] Eligibility Department

## 2020-01-12 ENCOUNTER — Telehealth: Payer: Self-pay | Admitting: Physician Assistant

## 2020-01-12 NOTE — Telephone Encounter (Signed)
° ° °  Pt would like to speak with Richardson Dopp, he said he has question about his health

## 2020-01-12 NOTE — Telephone Encounter (Signed)
I spoke with patient who is asking about lovenox injections prior to surgery.  I told him this would be managed by PCP and he should contact that office with questions.  I scheduled patient for 6 month follow up with Richardson Dopp, PA on 05/04/20 at 8:15

## 2020-01-14 NOTE — Telephone Encounter (Signed)
Spoke with patient and he states he did get clarification from [facility] regarding injections needed.

## 2020-01-14 NOTE — Telephone Encounter (Signed)
Patient notified and verbalized understanding. 

## 2020-01-15 ENCOUNTER — Other Ambulatory Visit: Payer: Self-pay

## 2020-01-15 ENCOUNTER — Ambulatory Visit (INDEPENDENT_AMBULATORY_CARE_PROVIDER_SITE_OTHER): Payer: Medicare Other

## 2020-01-15 DIAGNOSIS — Z7901 Long term (current) use of anticoagulants: Secondary | ICD-10-CM

## 2020-01-15 DIAGNOSIS — I6601 Occlusion and stenosis of right middle cerebral artery: Secondary | ICD-10-CM

## 2020-01-15 LAB — POCT INR: INR: 2 (ref 2.0–3.0)

## 2020-01-15 NOTE — Progress Notes (Signed)
Pt here for INR check per  Goal INR = 2.0 -3.0  Last INR = 2.5  Pt currently takes Coumadin 9 mg and 6 mg alternating doses  Date of last Coumadin dose = 01/15/2020  Pt denies recent antibiotics, no dietary changes and no unusual bruising / bleeding. Recently starting taking Centrum MVI for Men over 50.   INR today = 2.0  Pt advised per Joya Martyr, to take Coumadin 9 mg Thursday, Friday, Saturday and to take Coumadin 6 mg Sunday, Monday, Tuesday, Wednesday.  Repeat INR check in 1 week.

## 2020-01-17 ENCOUNTER — Other Ambulatory Visit: Payer: Self-pay | Admitting: Nurse Practitioner

## 2020-01-19 ENCOUNTER — Ambulatory Visit: Payer: Medicaid Other | Admitting: Adult Health

## 2020-01-19 ENCOUNTER — Encounter: Payer: Self-pay | Admitting: Adult Health

## 2020-01-19 ENCOUNTER — Telehealth: Payer: Self-pay

## 2020-01-19 NOTE — Telephone Encounter (Signed)
Spoke to pt and he had appt today, but per Dr.Sethi note see in one year.  Made appt 06-22-2018 at 1115, arrive 1045 with JM/NP.

## 2020-01-19 NOTE — Telephone Encounter (Signed)
Pt left a VM asking for a call to discuss his appt for today.

## 2020-01-19 NOTE — Telephone Encounter (Signed)
I called pt back on phone and reiterated appt date time arrival time for him to place on his calendar.

## 2020-01-19 NOTE — Progress Notes (Deleted)
[facility] [address] [place]. [place] [address] [phone]       OFFICE FOLLOW-UP NOTE  [name] Date of Birth:  March 22, 1951 Medical Record Number:  [id number]   HPI:   Today, 01/19/2020, [name] requested sooner follow-up visit ***.  Previously seen on 06/26/2019 doing well from a stroke standpoint and recommended follow-up around 05/2020.    Remains on Coumadin without bleeding or bruising Remains on atorvastatin without myalgias Blood pressure today ***     History provided for reference purposes only Update 06/26/2019 Dr. Leonie Man : He returns for follow-up after last visit 6 months ago.  He is doing well from stroke standpoint without recurrent stroke or TIA symptoms.  He remains on warfarin and his INR has been fairly steady between 2 and 3.  He denies significant bleeding or bruising.  Continues to have some numbness and diminished fine motor skills in his hands and struggles while wearing his clothes.  Continues to walk long distances with a cane.  He feels his balance is still off.  He is an only one minor fall but no major injuries.  He had a follow-up echocardiogram done in August 2020 which showed improvement in his ejection fraction to now 20%.  He does have a follow-up appointment with his cardiologist next month.  He has no new complaints.  His blood pressure is well controlled and it is 100/63 today he is tolerating Lipitor well without any side effects  Initial visit 12/10/2018 Dr. Leonie Man: [name] is a 69 year old African-American male seen today for initial office follow-up visit following hospital admission for stroke in May 2020.  History is obtained from the patient and review of electronic medical records.  I personally reviewed imaging films in PACS.Mr.Jared Atkinsis a 69 y.o.malewith history of ongoing tobacco use, COPD, CHF, cardiomyopathy (EF 15%), lung cancer, hx of substance abuse, and Hldpresenting with acute onset of left  sided weakness with hemineglect, left facial droop, confusion and dysphasia after falling in bathroom. CT scan of the head on admission showed no acute pathology.  Patient met criteria for IV TPA which was administered uneventfully.  CT angiogram showed acute thrombus occluding the distal right M1 segment and extending into the M2 branches.  CT perfusion showed significant penumbra.  Patient was taken for emergent mechanical thrombectomy which was performed successfully withTICI 2b reperfusion achieved by Dr. Estanislado Pandy.  Patient was admitted to the intensive care unit and had close neurological monitoring and strict blood pressure control and did well.  MRI scan subsequently showed patchy right MCA infarct involving temporal lobes, right insula, basal ganglia and frontal lobe.  2D echo showed reduced ejection fraction of 15% but no definite clot.  LDL cholesterol was 94 mg percent and hemoglobin A1c was 5.7.  Urine drug screen was positive only for benzodiazepines.  Patient had been on aspirin which was switch to warfarin for anticoagulation.  He was transferred for rehabilitation to skilled nursing facility but patient signed himself out and his living at home and getting home therapy.  He states he is doing well.  He can walk about 1 to 2 miles.  He uses a cane for outdoors but can walk indoors without it.  He states that his left leg drags a little bit but he can catch himself and avoid falls and injuries.  He is also noticed some decreased fine motor skills in his left hand.  He is tolerating warfarin well but his INR does fluctuate.  His last INR  that I can find in the system was in 10/29/2018 and was optimal at 2.2.  Denies significant bleeding though he does bruise easily.  He has not had seen his cancer doctor for follow-up and is missed a recent appointment due to the [place] situation.  He has quit smoking.     ROS:   14 system review of systems is positive for leg weakness, gait difficulty, fatigue,  tiredness, balance problems and all other systems negative  PMH:  Past Medical History:  Diagnosis Date  . Chronic obstructive pulmonary disease   . Combined systolic and diastolic CHF    Mixed Ischemic/Non-Ischemic CM // Echo 09/2018: EF 15 // cMRI 10/2018: EF 11, inf scar // Echo 12/2018: EF 20-25, Gr 3 DD // Not a candidate for ICD due to comorbid illnesses   . Coronary artery disease    cath 09/2018: RCA chronically occluded >> Med Rx  . 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 Hx of recurrent R pleural effusion    s/p thoracentesis  . Hypercholesteremia   . Lung cancer ([place]) 03/2016  . Mixed Ischemic and Non-Ischemic Cardiomyopathy   . Non-small cell carcinoma of lung   . Nonsustained ventricular tachycardia     Social History:  Social History   Socioeconomic History  . Marital status: Single    Spouse name: Not on file  . Number of children: Not on file  . Years of education: Not on file  . Highest education level: Not on file  Occupational History  . Not on file  Tobacco Use  . Smoking status: Current Some Day Smoker  . Smokeless tobacco: Never Used  Vaping Use  . Vaping Use: Never used  Substance and Sexual Activity  . Alcohol use: Not Currently  . Drug use: Not Currently  . Sexual activity: Not on file  Other Topics Concern  . Not on file  Social History Narrative  . Not on file   Social Determinants of Health   Financial Resource Strain:   . Difficulty of Paying Living Expenses: Not on file  Food Insecurity:   . Worried About Charity fundraiser in the Last Year: Not on file  . Ran Out of Food in the Last Year: Not on file  Transportation Needs:   . Lack of Transportation (Medical): Not on file  . Lack of Transportation (Non-Medical): Not on file  Physical Activity:   . Days of Exercise per Week: Not on file  . Minutes of Exercise per Session: Not on file  Stress:   . Feeling of Stress : Not on file    Social Connections:   . Frequency of Communication with Friends and Family: Not on file  . Frequency of Social Gatherings with Friends and Family: Not on file  . Attends Religious Services: Not on file  . Active Member of Clubs or Organizations: Not on file  . Attends Archivist Meetings: Not on file  . Marital Status: Not on file  Intimate Partner Violence:   . Fear of Current or Ex-Partner: Not on file  . Emotionally Abused: Not on file  . Physically Abused: Not on file  . Sexually Abused: Not on file    Medications:   Current Outpatient Medications on File Prior to Visit  Medication Sig Dispense Refill  . amitriptyline (ELAVIL) 50 MG tablet Take 50 mg by mouth at bedtime as needed.     Marland Kitchen  atorvastatin (LIPITOR) 80 MG tablet Take 1 tablet (80 mg total) by mouth daily. 90 tablet 3  . carvedilol (COREG) 3.125 MG tablet Take 1 tablet (3.125 mg total) by mouth 2 (two) times daily. 180 tablet 3  . Cyanocobalamin (B-12) 2500 MCG SUBL Place under the tongue.    . digoxin (LANOXIN) 0.125 MG tablet Take 0.125 mg by mouth daily. Prescribed by Dustin Folks, NP on 10/20/19    . furosemide (LASIX) 40 MG tablet Take 1 tablet (40 mg total) by mouth daily. 30 tablet 2  . latanoprost (XALATAN) 0.005 % ophthalmic solution Place 1 drop into both eyes 2 (two) times daily.   11  . spironolactone (ALDACTONE) 25 MG tablet Take 0.5 tablets (12.5 mg total) by mouth daily. (Patient taking differently: Take 12.5 mg by mouth every other day. Changed by Dustin Folks NP on 08/09/19) 15 tablet 2  . SYMBICORT 160-4.5 MCG/ACT inhaler INL 2 PFS PO BID IN THE MORNING AND IN THE EVE    . warfarin (COUMADIN) 6 MG tablet Take 1tab on Sun-Wed and 1.5tab Thur-Sat. 14 tablet 0   No current facility-administered medications on file prior to visit.    Allergies:  No Known Allergies  Physical Exam General: Frail middle-aged African-American male, seated, in no evident distress Head: head normocephalic and atraumatic.   Neck: supple with no carotid or supraclavicular bruits Cardiovascular: regular rate and rhythm, no murmurs Musculoskeletal: no deformity Skin:  no rash/petichiae Vascular:  Normal pulses all extremities There were no vitals filed for this visit. Neurologic Exam Mental Status: Awake and fully alert. Oriented to place and time. Recent and remote memory intact. Attention span, concentration and fund of knowledge appropriate. Mood and affect appropriate.  Cranial Nerves: Fundoscopic exam reveals sharp disc margins. Pupils equal, briskly reactive to light. Extraocular movements full without nystagmus. Visual fields full to confrontation. Hearing intact. Facial sensation intact. Face, tongue, palate moves normally and symmetrically.  Motor: Normal bulk and tone. Normal strength in all tested extremity muscles.  Diminished fine finger movements on the left.  Orbits right over left upper extremity.  Increased tone in the left leg. Sensory.: intact to touch ,pinprick .position and vibratory sensation.  Coordination: Rapid alternating movements normal in all extremities. Finger-to-nose and heel-to-shin performed accurately bilaterally. Gait and Station: Arises from chair without difficulty. Stance is normal. Gait demonstrates normal stride length and balance but drags the left leg slightly..  Unable to tandem walk without difficulty.  Reflexes: 1+ and symmetric. Toes downgoing.      ASSESSMENT/PLAN: 69 year old African-American male with embolic right MCA stroke in May 2020 treated with IV TPA followed by successful mechanical thrombectomy from cardiogenic embolism from ischemic cardiomyopathy.  He is done remarkably well with hardly any residual physical deficits.  Vascular risk factors of hypercoagulability from adenocarcinoma of the lung, cardiomyopathy with low ejection fraction, hyper lipidemia., and chronic smoking    Right MCA stroke -Continue Coumadin with INR goal 2-3 and atorvastatin for  secondary stroke prevention -Ensure close PCP follow-up for aggressive stroke risk factor management including HTN with BP goal<130/90 and HLD with LDL goal<70 -Continue to follow with cardiology    I spent *** minutes of face-to-face and non-face-to-face time with patient.  This included previsit chart review, lab review, study review, order entry, electronic health record documentation, patient education  Frann Rider, [facility]  [facility] [address] [place], [place] [address]  Phone [phone] Fax [phone] Note: This document was prepared with digital dictation and possible smart  Company secretary. Any transcriptional errors that result from this process are unintentional.

## 2020-01-21 ENCOUNTER — Other Ambulatory Visit: Payer: Self-pay

## 2020-01-21 NOTE — Progress Notes (Signed)
Medical screening examination/treatment/procedure(s) were performed by the RN. As primary care provider I was immediately available for consulation/collaboration. I agree with above documentation. Wilfred Lacy, AGNP-C

## 2020-01-22 ENCOUNTER — Ambulatory Visit: Payer: Medicare Other

## 2020-01-22 DIAGNOSIS — Z7901 Long term (current) use of anticoagulants: Secondary | ICD-10-CM

## 2020-01-22 DIAGNOSIS — I6601 Occlusion and stenosis of right middle cerebral artery: Secondary | ICD-10-CM

## 2020-01-22 LAB — POCT INR: INR: 2.6 (ref 2.0–3.0)

## 2020-01-22 NOTE — Progress Notes (Unsigned)
Pt here for INR check per Wilfred Lacy, AGNP  Goal INR = 2.0 - 3.0  Last INR = 2.0  Pt currently takes Coumadin Coumadin 9 mg Thursday, Friday, Saturday and Coumadin 6 mg Sunday, Monday, Tuesday, Wednesday.  Date of last Coumadin dose = 01/22/20  Pt denies recent antibiotics, no dietary changes and no unusual bruising / bleeding. Discontinued use of Centrum MVI for Men over 50.   INR today = 2.6  Pt advised per Dr. Ethelene Hal to take Coumadin 9 mg Thursday, Friday, Saturday and to take Coumadin 6 mg Sunday, Monday, Tuesday, Wednesday.  Repeat INR check in 1 month.

## 2020-01-23 ENCOUNTER — Telehealth: Payer: Self-pay | Admitting: Nurse Practitioner

## 2020-01-23 NOTE — Telephone Encounter (Signed)
Patient called and is requesting a call back regarding medication, please advise. CB is [phone]

## 2020-01-23 NOTE — Telephone Encounter (Signed)
Pt states he was reading his after visit summary last night and it says he is to take his spironolactone every day instead of every other day and he just wants clarification as to how he is suppose to take this medication. Please advise.

## 2020-01-24 ENCOUNTER — Other Ambulatory Visit: Payer: Self-pay | Admitting: Nurse Practitioner

## 2020-01-24 NOTE — Telephone Encounter (Signed)
I documented as he reported

## 2020-01-26 NOTE — Telephone Encounter (Signed)
Patient notified and verbalized understanding. 

## 2020-01-27 ENCOUNTER — Other Ambulatory Visit: Payer: Self-pay | Admitting: Nurse Practitioner

## 2020-01-27 ENCOUNTER — Other Ambulatory Visit: Payer: Self-pay

## 2020-01-27 DIAGNOSIS — Z7901 Long term (current) use of anticoagulants: Secondary | ICD-10-CM

## 2020-01-27 MED ORDER — WARFARIN SODIUM 6 MG PO TABS: ORAL_TABLET | ORAL | 1 refills | Status: DC

## 2020-01-27 NOTE — Telephone Encounter (Signed)
-----   Message from Brynda Peon, RN sent at 01/27/2020 11:55 AM EDT ----- Pt called into [facility] and is requesting a refill on his Warfarin rx.  Pt states his Warfarin is managed by you at [facility].  Please refill pt's Warfarin since you are monitoring and managing his INRs.  Pt wishes for the rx to go to [facility] on [address]. Thanks, Doroteo Bradford

## 2020-02-01 IMAGING — XA IR PERCUTANEOUS ART THORMBECTOMY/INFUSION INTRACRANIAL INCLUDE D
2 series · 12 of 24 positions shown · non-contrast
Comparison: CT angiogram of the head and neck of 10/21/2015.

INDICATION: Left-sided weakness with right gaze preference and dysarthria.
Occluded right middle cerebral artery distal M1 segment.

EXAM:
1. EMERGENT LARGE VESSEL OCCLUSION THROMBOLYSIS anterior
CIRCULATION)
TECHNIQUE: Following a full explanation of the procedure along with the
potential associated complications, an informed witnessed consent
was obtained from the patient's sister and daughter. The risks of
intracranial hemorrhage of 10%, worsening neurological deficit,
ventilator dependency, death and inability to revascularize were all
reviewed in detail with the patient's sister and daughter.

[Series 26: <mpr range>3 · axial · 2.0mm · 0.30mm/px · z∈[-181,-24]mm · 6 of 27 slices shown]
[im 3/27]
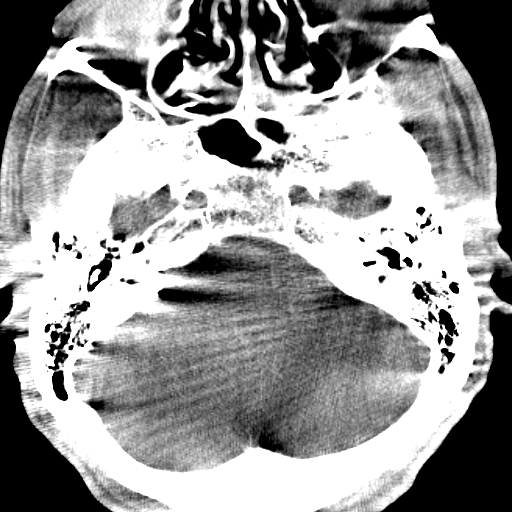
[im 8/27]
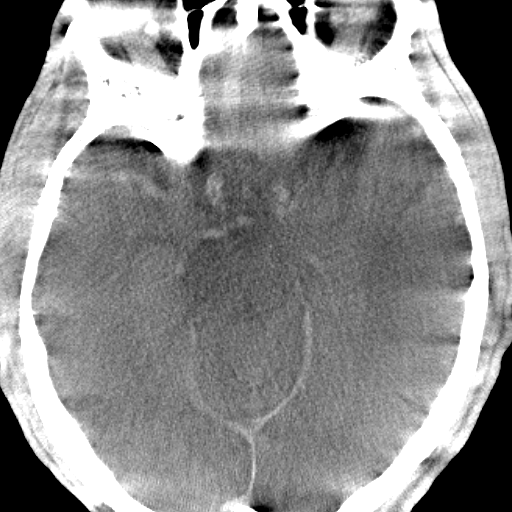
[im 12/27]
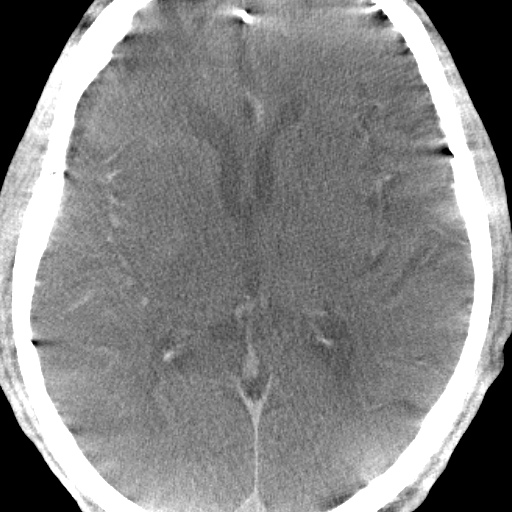
[im 17/27]
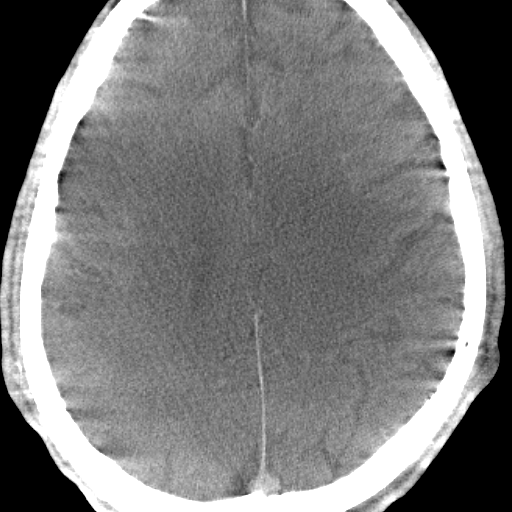
[im 22/27]
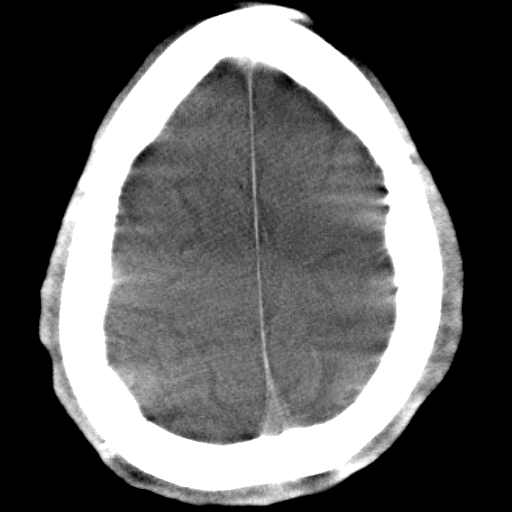
[im 27/27]
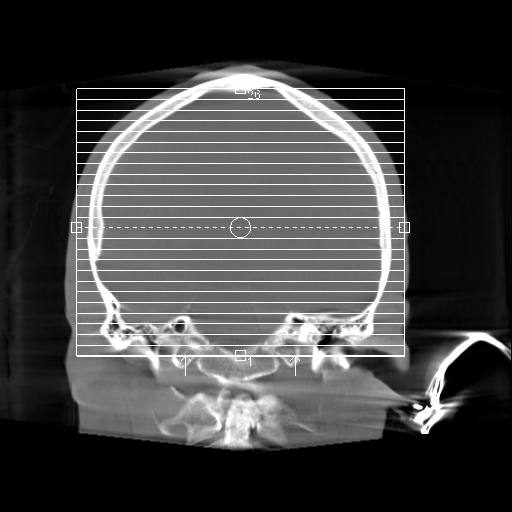

[Series 26: <mpr range>2 · axial · 2.0mm · 0.37mm/px · z∈[-189,-24]mm · 6 of 28 slices shown]
[im 3/28]
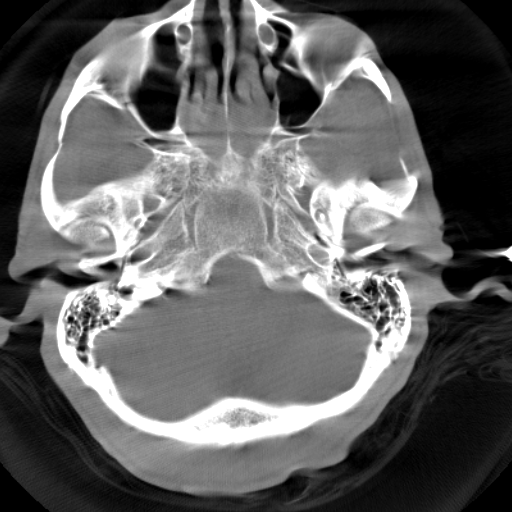
[im 8/28]
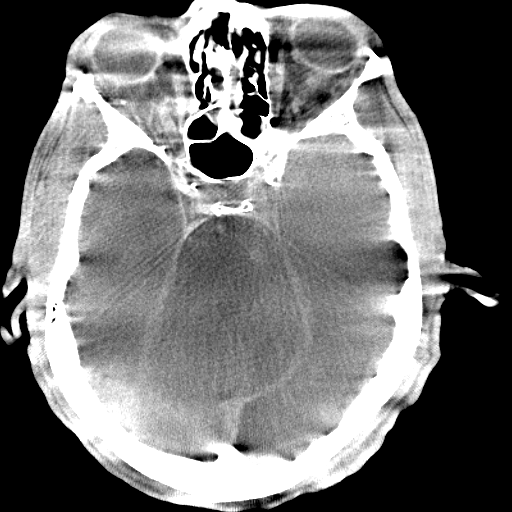
[im 13/28]
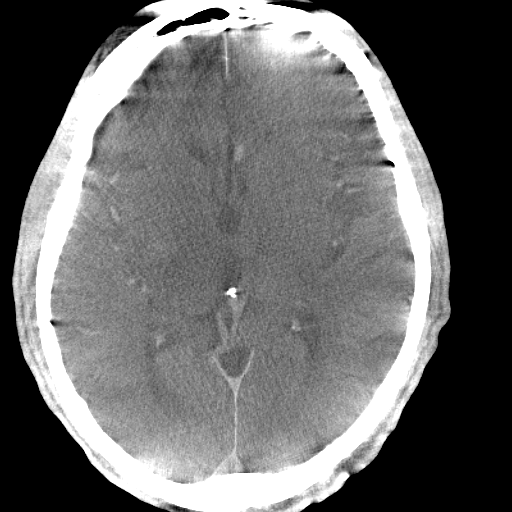
[im 18/28]
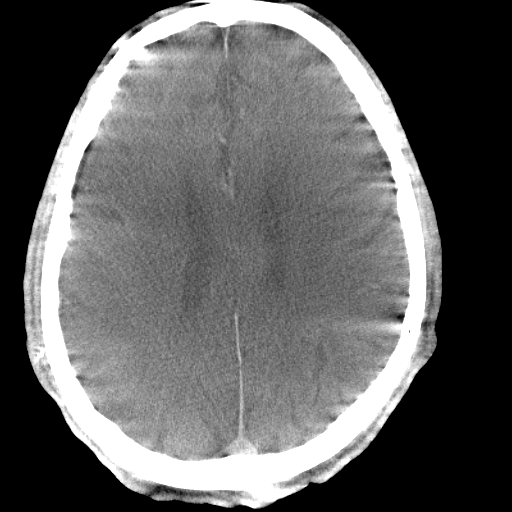
[im 23/28]
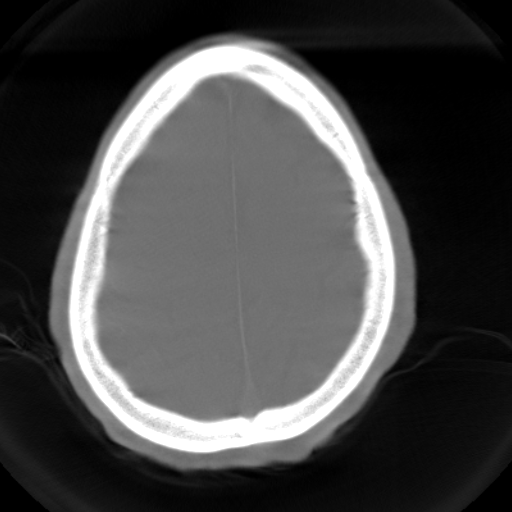
[im 28/28]
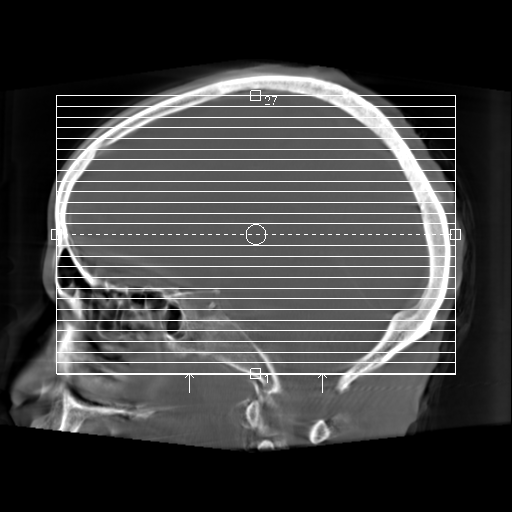

[12 of 24 positions shown; findings below may reference images not displayed]

MEDICATIONS:
2 g Ancef IV antibiotic was administered within 1 hour of the
procedure.

ANESTHESIA/SEDATION:
General anesthesia.

CONTRAST:  Isovue 300 approximately 100 mL.

FLUOROSCOPY TIME:  Fluoroscopy Time: 50 minutes 46 seconds (7930
mGy).

COMPLICATIONS:
None immediate.
The patient was then put under general anesthesia by the [REDACTED] at [HOSPITAL].

The right groin was prepped and draped in the usual sterile fashion.
Thereafter using modified Seldinger technique, transfemoral access
into the right common femoral artery was obtained without
difficulty. Over a 0.035 inch guidewire a 5 French Pinnacle sheath
was inserted. Through this, and also over a 0.035 inch guidewire a 5
French JB 1 catheter was advanced to the aortic arch region and
selectively positioned in the innominate artery artery and the right
common carotid artery and the left common carotid artery.
FINDINGS: Innominate artery angiogram demonstrates the right subclavian artery
and the right common carotid artery origins to be widely patent.

The right vertebral artery is patent. The vessel is seen to opacify
to the cranial skull base. Opacification is seen of the right
vertebrobasilar junction and the right posterior-inferior cerebellar
artery.

The opacified portion of the basilar artery, the posterior cerebral
arteries and the superior cerebellar arteries is grossly intact on
the lateral DSA images. Unopacified blood is seen in the basilar
artery from the contralateral vertebral artery.

The right common carotid arteriogram demonstrates wide patency of
the right external carotid artery and its major branches. The right
internal carotid artery at the bulb to the cranial skull base
demonstrates wide patency.

The petrous, cavernous and supraclinoid segments are widely patent.

A right posterior communicating artery is seen opacifying the right
posterior cerebral distribution.

The right anterior cerebral artery opacifies into the capillary and
venous phases. The right middle cerebral artery opacifies to the
proximal right M1 segment with diminutive branches of the right MCA
trifurcation.

PROCEDURE:
The diagnostic JB 1 catheter in right common carotid artery was then
exchanged over a 0.035 inch 300 cm Rosen exchange guidewire for a 55
cm 8 French Brite tip neurovascular sheath. Good aspiration was
obtained from the side port of the neurovascular sheath. This was
then connected to continuous heparinized saline infusion.

Over the Rosen exchange guidewire, a 95 cm 8 French FlowGate guide
catheter which had been prepped with 50% contrast and 50%
heparinized saline infusion was advanced and positioned in the
distal right internal carotid artery. The guidewire was removed.
Good aspiration obtained from the hub of the FlowGate guide
catheter. A gentle contrast injection demonstrated no evidence of
spasms, dissections or of intraluminal filling defects. Over a
inch Softip Synchro micro guidewire, a combination of a 132 cm 6
French Catalyst guide catheter inside of which was an GPOOrevo
ProVue microcatheter was advanced to the supraclinoid right ICA.

The micro guidewire was then gently manipulated using a torque
device into the M2 M3 region of the inferior division. The guidewire
was removed. Good aspiration obtained from the hub of the
microcatheter. A gentle contrast injection demonstrates safe
position of tip of the microcatheter. This was then connected to
continuous heparinized saline infusion. A 5 mm x 33 mm Embotrap
retrieval device was then advanced to the distal end of the
microcatheter. The Catalyst guide catheter was advanced into the mid
M1 segment of the right middle cerebral artery.

The O ring on the delivery microcatheter was then loosened. With
slight forward gentle traction with the right hand on the delivery
micro guidewire, with the left hand the delivery microcatheter was
retrieved unsheathing the retrieval device.

The 6 French Catalyst guide catheter was then advanced to oppose and
engage the proximal portion of the clot.

With proximal flow arrest in the right internal carotid artery by
inflating the balloon of the FlowGate guide catheter, a combination
of the retrieval device, the microcatheter, and the 6 French
Catalyst guide catheter was then retrieved proximally as constant
aspiration was applied with a 60 mL syringe at the hub of the
FlowGate guide catheter, and a Penumbra aspiration device at the hub
of the 6 French Catalyst guide catheter. The combination was
retrieved and removed. Flow arrest was reversed in the right
internal carotid artery.

A control arteriogram performed through the FlowGate guide catheter
in the right internal carotid artery demonstrated now angiographic
complete revascularization of the right middle cerebral M1 segment,
and also a very dominant inferior division with multiple branches. A
TICI 2b revascularization had been achieved.

There continued to be lack of visibility of the previously seen
diminutive superior division branches.

The combination of the Trevo ProVue microcatheter, the 6 French
Catalyst guide catheter over a 0.014 inch Softip Synchro micro
guidewire was then advanced to the right middle cerebral artery M1
segment.

The micro guidewire was then exchanged for a 016 Fathom micro
guidewire.

The micro guidewire was then gently manipulated with a torque device
to advance into the diminutive superior division into the M2 region.

This was then followed by the microcatheter. The guidewire was
removed. There was good aspiration at the hub of the microcatheter.
Gentle control arteriogram performed demonstrated slow antegrade
flow into these diminutive branches.

A 5 mm x 33 mm Embotrap retrieval device was again advanced and
deployed such that it covered the M2 region of superior division of
the right middle cerebral artery.

Again with constant aspiration through the Catalyst guide catheter
at the origin of the superior division, and proximal flow arrest,
the combination of the retrieval device, and the microcatheter in
the 6 French Catalyst guide catheter were retrieved and removed as
constant aspiration was applied at the hub of the FlowGate guide
catheter with a 60 mL syringe and Penumbra aspiration catheter at
the hub of the 6 French Catalyst guide catheter.

Following reversal of flow, flow arrest a control arteriogram
performed through the FlowGate guide catheter demonstrated minimal
opacification of the superior division of the right middle cerebral
artery.

Patient was given 25 mcg of nitroglycerin intra-arterially x3 in
order to relieve the vasospasm with mild benefit.

No further attempt was made to target the diminutive superior
division branches as the patient had already received IV tPA.

A final control arteriogram performed through the 8 French FlowGate
guide catheter in the right common carotid artery demonstrated wide
patency of the right internal carotid artery extra cranially and
intracranially.

There continued to be patency of the right middle cerebral artery
branches and the right anterior cerebral artery branch into the
capillary and venous phases. The area supplied by the diminutive
branches of the superior division appeared to be gradually
recanalizing. The right posterior communicating artery with
opacification of both posterior cerebral arteries continued to be
present.

The left common carotid arteriogram demonstrated the left external
carotid artery and its major branches to be widely patent.

The left internal carotid artery at the bulb to the cranial skull
base also demonstrates wide patency.

The petrous, cavernous and supraclinoid segments demonstrate wide
patency as well.

Cross-filling via the anterior communicating artery of the right
anterior cerebral A2 segment and distally was noted transiently.

During the procedure, no evidence of extravasation or of mass effect
or midline shift was noted.

The 8 French FlowGate guide catheter was then retrieved into the
abdominal aorta as was the 55 cm Brite tip neurovascular sheath.
These were then exchanged over a J-tip guidewire for an 8 French
Pinnacle sheath. An 8 French Pinnacle sheath was converted to a 7
French Pinnacle sheath in order to maintain antegrade flow into the
right common femoral artery trifurcation regions.

At the end of the procedure, a flat panel CT of the brain
demonstrated no evidence of gross mass effect, midline shift or of
intracranial hemorrhage.

Distal pulses remained Dopplerable in the dorsalis pedis and the
posterior tibial regions bilaterally in both feet at the end of the
procedure unchanged from prior to the procedure.

The patient was then transported to the neuro ICU for post
thrombectomy care.
IMPRESSION: Status post endovascular revascularization of occluded right middle
cerebral artery distal M1 segment, achieving TICI 2b
revascularization, with 2 passes with the 5 mm x 33 mm Embotrap
retrieval device.

PLAN:
Follow-up in the clinic 4-6 weeks post discharge.

## 2020-02-01 IMAGING — DX PORTABLE CHEST - 1 VIEW
1 series · 1 of 1 positions shown · non-contrast
Comparison: 09/27/2018

CLINICAL DATA: Intubation

EXAM:
PORTABLE CHEST 1 VIEW

[chest]
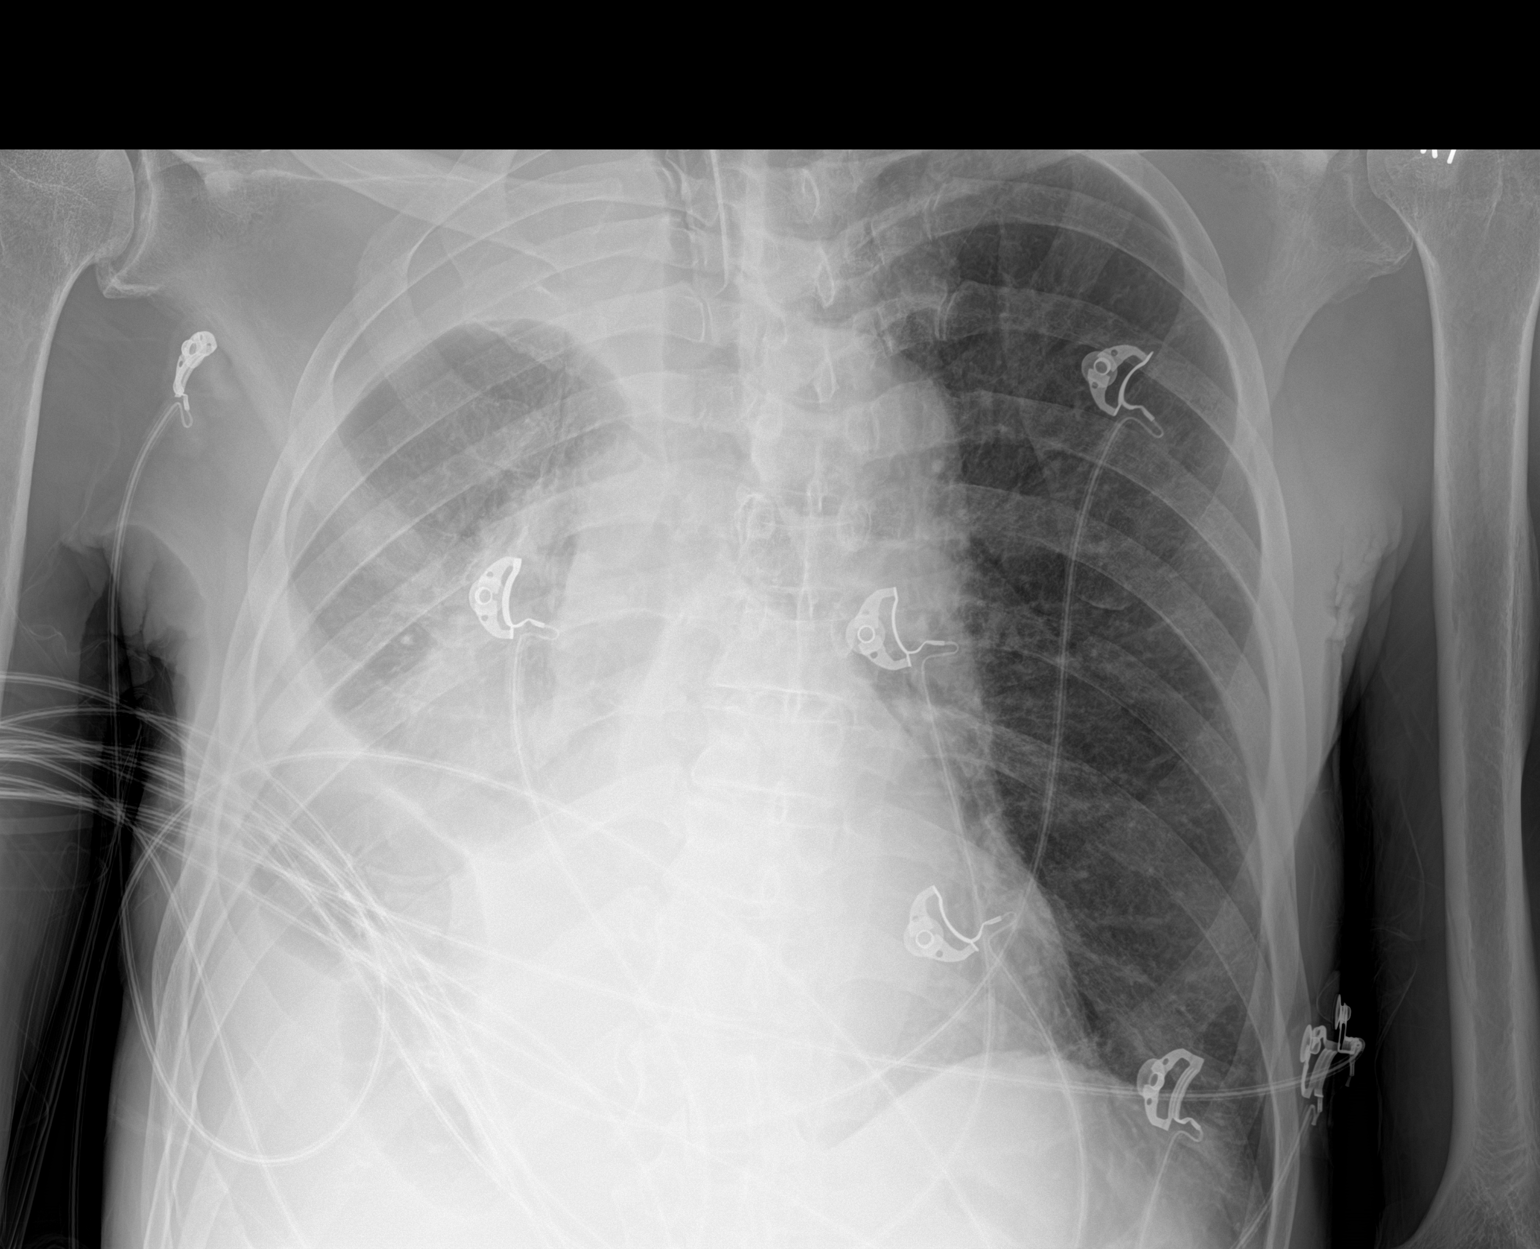

[1 of 1 positions shown; findings below may reference images not displayed]

FINDINGS: The endotracheal tube terminates above the carina by approximately 6
cm. There is a multiloculated right-sided pleural effusion which has
increased in size from prior study. No evidence of a right-sided
pneumothorax. The left lung field is mostly clear.
IMPRESSION: 1. Endotracheal tube as above.

2. Interval increase in size of the right-sided pleural effusion.

## 2020-02-05 ENCOUNTER — Other Ambulatory Visit: Payer: Self-pay | Admitting: Nurse Practitioner

## 2020-02-05 NOTE — Telephone Encounter (Signed)
Patient states this was prescribed by his previous pcp and he had a 90 day supply and is now down to 9 pills with no refills. Please advise

## 2020-02-05 NOTE — Telephone Encounter (Signed)
Patient is calling and requesting a refill for Digoxin sent to [facility] on [facility], please advise. CB is [phone]

## 2020-02-06 MED ORDER — DIGOXIN 125 MCG PO TABS: 0.1250 mg | ORAL_TABLET | Freq: Every day | ORAL | 3 refills | Status: DC

## 2020-02-12 ENCOUNTER — Telehealth: Payer: Self-pay

## 2020-02-12 ENCOUNTER — Ambulatory Visit: Payer: Medicare HMO

## 2020-02-12 ENCOUNTER — Inpatient Hospital Stay: Payer: Medicare Other | Attending: Internal Medicine

## 2020-02-12 ENCOUNTER — Ambulatory Visit (HOSPITAL_COMMUNITY)
Admission: RE | Admit: 2020-02-12 | Discharge: 2020-02-12 | Disposition: A | Discharge status: Home / Self Care | Payer: Medicare HMO | Source: Ambulatory Visit | Attending: Internal Medicine | Admitting: Internal Medicine

## 2020-02-12 ENCOUNTER — Other Ambulatory Visit: Payer: Self-pay

## 2020-02-12 DIAGNOSIS — C3490 Malignant neoplasm of unspecified part of unspecified bronchus or lung: Secondary | ICD-10-CM | POA: Diagnosis not present

## 2020-02-12 LAB — CMP (CANCER CENTER ONLY)
ALT: 20 U/L (ref 0–44)
AST: 23 U/L (ref 15–41)
Albumin: 3.7 g/dL (ref 3.5–5.0)
Alkaline Phosphatase: 91 U/L (ref 38–126)
Anion gap: 8 (ref 5–15)
BUN: 17 mg/dL (ref 8–23)
CO2: 29 mmol/L (ref 22–32)
Calcium: 9.7 mg/dL (ref 8.9–10.3)
Chloride: 105 mmol/L (ref 98–111)
Creatinine: 1 mg/dL (ref 0.61–1.24)
GFR, Est AFR Am: >60 mL/min (ref >60)
GFR, Estimated: >60 mL/min (ref >60)
Glucose, Bld: 82 mg/dL (ref 70–99)
Potassium: 4 mmol/L (ref 3.5–5.1)
Sodium: 142 mmol/L (ref 135–145)
Total Bilirubin: 0.7 mg/dL (ref 0.3–1.2)
Total Protein: 7.6 g/dL (ref 6.5–8.1)

## 2020-02-12 LAB — CBC WITH DIFFERENTIAL (CANCER CENTER ONLY)
Abs Immature Granulocytes: 0.02 10*3/uL (ref 0.00–0.07)
Basophils Absolute: 0.1 10*3/uL (ref 0.0–0.1)
Basophils Relative: 1 %
Eosinophils Absolute: 0.3 10*3/uL (ref 0.0–0.5)
Eosinophils Relative: 4 %
HCT: 46.4 % (ref 39.0–52.0)
Hemoglobin: 15.3 g/dL (ref 13.0–17.0)
Immature Granulocytes: 0 %
Lymphocytes Relative: 22 %
Lymphs Abs: 1.6 10*3/uL (ref 0.7–4.0)
MCH: 28.9 pg (ref 26.0–34.0)
MCHC: 33 g/dL (ref 30.0–36.0)
MCV: 87.5 fL (ref 80.0–100.0)
Monocytes Absolute: 0.6 10*3/uL (ref 0.1–1.0)
Monocytes Relative: 8 %
Neutro Abs: 4.6 10*3/uL (ref 1.7–7.7)
Neutrophils Relative %: 65 %
Platelet Count: 305 10*3/uL (ref 150–400)
RBC: 5.3 MIL/uL (ref 4.22–5.81)
RDW: 16.8 % — ABNORMAL HIGH (ref 11.5–15.5)
WBC Count: 7.1 10*3/uL (ref 4.0–10.5)
nRBC: 0 % (ref 0.0–0.2)

## 2020-02-12 MED ORDER — IOHEXOL 300 MG/ML  SOLN
75.0000 mL | Freq: Once | INTRAMUSCULAR | Status: AC | PRN
  Administered 2020-02-12: 75 mL via INTRAVENOUS

## 2020-02-12 NOTE — Telephone Encounter (Signed)
Attempted x 3 to contact patient for 3:00 phone visit appt for AWV. Left message for patient to call back.

## 2020-02-12 NOTE — Progress Notes (Deleted)
Subjective:   Jared Stevens is a 69 y.o. male who presents for an Initial Medicare Annual Wellness Visit.  I connected with Kit today by telephone and verified that I am speaking with the correct person using two identifiers. Location patient: home Location provider: work Persons participating in the virtual visit: patient, Marine scientist.    I discussed the limitations, risks, security and privacy concerns of performing an evaluation and management service by telephone and the availability of in person appointments. I also discussed with the patient that there may be a patient responsible charge related to this service. The patient expressed understanding and verbally consented to this telephonic visit.    Interactive audio and video telecommunications were attempted between this provider and patient, however failed, due to patient having technical difficulties OR patient did not have access to video capability.  We continued and completed visit with audio only.  Some vital signs may be absent or patient reported.   Time Spent with patient on telephone encounter: *** minutes  Review of Systems    ***       Objective:    There were no vitals filed for this visit. There is no height or weight on file to calculate BMI.  Advanced Directives 10/30/2018 10/25/2018 10/21/2018 09/27/2018 09/05/2018 05/06/2018 04/29/2018  Does Patient Have a Medical Advance Directive? Yes - No No No No No  Type of Advance Directive (No Data) - - - - - -  Does patient want to make changes to medical advance directive? No - Patient declined - - - - - -  Would patient like information on creating a medical advance directive? No - Patient declined No - Patient declined - No - Patient declined No - Patient declined - No - Patient declined    Current Medications (verified) Outpatient Encounter Medications as of 02/12/2020  Medication Sig  . amitriptyline (ELAVIL) 50 MG tablet Take 50 mg by mouth at bedtime as needed.   Marland Kitchen  atorvastatin (LIPITOR) 80 MG tablet Take 1 tablet (80 mg total) by mouth daily.  . carvedilol (COREG) 3.125 MG tablet Take 1 tablet (3.125 mg total) by mouth 2 (two) times daily.  . Cyanocobalamin (B-12) 2500 MCG SUBL Place under the tongue.  . digoxin (LANOXIN) 0.125 MG tablet Take 1 tablet (0.125 mg total) by mouth daily. Prescribed by Dustin Folks, NP on 10/20/19  . furosemide (LASIX) 40 MG tablet Take 1 tablet (40 mg total) by mouth daily.  Marland Kitchen latanoprost (XALATAN) 0.005 % ophthalmic solution Place 1 drop into both eyes 2 (two) times daily.   Marland Kitchen spironolactone (ALDACTONE) 25 MG tablet Take 0.5 tablets (12.5 mg total) by mouth every other day. Changed by Dustin Folks NP on 08/09/19  . SYMBICORT 160-4.5 MCG/ACT inhaler INL 2 PFS PO BID IN THE MORNING AND IN THE EVE  . warfarin (COUMADIN) 6 MG tablet Take 1tab on Sun-Wed and 1.5tab Thur-Sat.   No facility-administered encounter medications on file as of 02/12/2020.    Allergies (verified) Patient has no known allergies.   History: Past Medical History:  Diagnosis Date  . Chronic obstructive pulmonary disease   . Combined systolic and diastolic CHF    Mixed Ischemic/Non-Ischemic CM // Echo 09/2018: EF 15 // cMRI 10/2018: EF 11, inf scar // Echo 12/2018: EF 20-25, Gr 3 DD // Not a candidate for ICD due to comorbid illnesses   . Coronary artery disease    cath 09/2018: RCA chronically occluded >> Med Rx  . 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 Hx of recurrent R pleural effusion    s/p thoracentesis  . Hypercholesteremia   . Lung cancer ([place]) 03/2016  . Mixed Ischemic and Non-Ischemic Cardiomyopathy   . Non-small cell carcinoma of lung   . Nonsustained ventricular tachycardia    Past Surgical History:  Procedure Laterality Date  . Arm Surgery    . HERNIA REPAIR    . IR ANGIO INTRA EXTRACRAN SEL COM CAROTID INNOMINATE UNI L MOD SED  10/21/2018  . IR ANGIO VERTEBRAL SEL SUBCLAVIAN INNOMINATE UNI R  MOD SED  10/21/2018  . IR CT HEAD LTD  10/21/2018  . IR PERCUTANEOUS ART THROMBECTOMY/INFUSION INTRACRANIAL INC DIAG ANGIO  10/21/2018  . IR THORACENTESIS ASP PLEURAL SPACE W/IMG GUIDE  09/27/2018  . RADIOLOGY WITH ANESTHESIA N/A 10/21/2018   Procedure: RADIOLOGY WITH ANESTHESIA;  Surgeon: Luanne Bras, MD;  Location: [place];  Service: Radiology;  Laterality: N/A;  . REPLANTATION THUMB    . RIGHT/LEFT HEART CATH AND CORONARY ANGIOGRAPHY N/A 10/01/2018   Procedure: RIGHT/LEFT HEART CATH AND CORONARY ANGIOGRAPHY;  Surgeon: Troy Sine, MD;  Location: [facility];  Service: Cardiovascular;  Laterality: N/A;   Family History  Problem Relation Age of Onset  . Aneurysm Mother    Social History   Socioeconomic History  . Marital status: Single    Spouse name: Not on file  . Number of children: Not on file  . Years of education: Not on file  . Highest education level: Not on file  Occupational History  . Not on file  Tobacco Use  . Smoking status: Current Some Day Smoker  . Smokeless tobacco: Never Used  Vaping Use  . Vaping Use: Never used  Substance and Sexual Activity  . Alcohol use: Not Currently  . Drug use: Not Currently  . Sexual activity: Not on file  Other Topics Concern  . Not on file  Social History Narrative  . Not on file   Social Determinants of Health   Financial Resource Strain:   . Difficulty of Paying Living Expenses: Not on file  Food Insecurity:   . Worried About Charity fundraiser in the Last Year: Not on file  . Ran Out of Food in the Last Year: Not on file  Transportation Needs:   . Lack of Transportation (Medical): Not on file  . Lack of Transportation (Non-Medical): Not on file  Physical Activity:   . Days of Exercise per Week: Not on file  . Minutes of Exercise per Session: Not on file  Stress:   . Feeling of Stress : Not on file  Social Connections:   . Frequency of Communication with Friends and Family: Not on file  . Frequency of  Social Gatherings with Friends and Family: Not on file  . Attends Religious Services: Not on file  . Active Member of Clubs or Organizations: Not on file  . Attends Archivist Meetings: Not on file  . Marital Status: Not on file    Tobacco Counseling Ready to quit: Not Answered Counseling given: Not Answered   Clinical Intake:                 Diabetic?No         Activities of Daily Living No flowsheet data found.  Patient Care Team: [name], [name], NP as PCP - General (Internal Medicine) Nahser, Wonda Cheng, MD as PCP - Cardiology (  Cardiology)  Indicate any recent Medical Services you may have received from other than [facility] providers in the past year (date may be approximate).     Assessment:   This is a routine wellness examination for [place].  Hearing/Vision screen No exam data present  Dietary issues and exercise activities discussed:    Goals   None    Depression Screen PHQ 2/9 Scores 01/01/2020 05/06/2018  PHQ - 2 Score 0 0    Fall Risk Fall Risk  01/01/2020 05/06/2018  Falls in the past year? 1 1  Comment - tripped on the curb  Number falls in past yr: 1 0  Injury with Fall? 1 1  Comment - broke his collar bone, hands and chest now numb    Any stairs in or around the home? {YES/NO:21197} If so, are there any without handrails? {YES/NO:21197} Home free of loose throw rugs in walkways, pet beds, electrical cords, etc? {YES/NO:21197} Adequate lighting in your home to reduce risk of falls? {YES/NO:21197}  ASSISTIVE DEVICES UTILIZED TO PREVENT FALLS:  Life alert? {YES/NO:21197} Use of a cane, walker or w/c? {YES/NO:21197} Grab bars in the bathroom? {YES/NO:21197} Shower chair or bench in shower? {YES/NO:21197} Elevated toilet seat or a handicapped toilet? {YES/NO:21197}  TIMED UP AND GO:  Was the test performed? No . Phone visit   Cognitive Function:        Immunizations Immunization History  Administered Date(s)  Administered  . Moderna SARS-COVID-2 Vaccination 10/23/2019, 11/20/2019    {TDAP status:2101805}   {Flu Vaccine status:2101806}   {Pneumococcal vaccine status:2101807}   Covid-19 vaccine status: Completed vaccines  Qualifies for Shingles Vaccine? {YES/NO:21197}  Zostavax completed {YES/NO:21197}  {Shingrix Completed?:2101804}  Screening Tests Health Maintenance  Topic Date Due  . Hepatitis C Screening  Never done  . TETANUS/TDAP  Never done  . COLONOSCOPY  Never done  . PNA vac Low Risk Adult (1 of 2 - PCV13) Never done  . INFLUENZA VACCINE  Never done  . COVID-19 Vaccine  Completed    Health Maintenance  Health Maintenance Due  Topic Date Due  . Hepatitis C Screening  Never done  . TETANUS/TDAP  Never done  . COLONOSCOPY  Never done  . PNA vac Low Risk Adult (1 of 2 - PCV13) Never done  . INFLUENZA VACCINE  Never done    {Colorectal cancer screening:2101809}  Lung Cancer Screening: (Low Dose CT Chest recommended if Age 31-80 years, 30 pack-year currently smoking OR have quit w/in 15years.) {DOES NOT does:27190::"does not"} qualify.   Lung Cancer Screening Referral: ***  Additional Screening:  Hepatitis C Screening: {DOES NOT does:27190::"does not"} qualify; Completed ***  Vision Screening: Recommended annual ophthalmology exams for early detection of glaucoma and other disorders of the eye. Is the patient up to date with their annual eye exam?  {YES/NO:21197} Who is the provider or what is the name of the office in which the patient attends annual eye exams? *** If pt is not established with a provider, would they like to be referred to a provider to establish care? {YES/NO:21197}.   Dental Screening: Recommended annual dental exams for proper oral hygiene  Community Resource Referral / Chronic Care Management: CRR required this visit?  {YES/NO:21197}  CCM required this visit?  {YES/NO:21197}     Plan:     I have personally reviewed and noted the  following in the patient's chart:   . Medical and social history . Use of alcohol, tobacco or illicit drugs  . Current medications and supplements .  Functional ability and status . Nutritional status . Physical activity . Advanced directives . List of other physicians . Hospitalizations, surgeries, and ER visits in previous 12 months . Vitals . Screenings to include cognitive, depression, and falls . Referrals and appointments  In addition, I have reviewed and discussed with patient certain preventive protocols, quality metrics, and best practice recommendations. A written personalized care plan for preventive services as well as general preventive health recommendations were provided to patient.     Marta Antu, LPN   1/64/2903   Nurse Notes: ***

## 2020-02-17 ENCOUNTER — Other Ambulatory Visit: Payer: Self-pay

## 2020-02-17 ENCOUNTER — Other Ambulatory Visit: Payer: Medicare Other

## 2020-02-18 ENCOUNTER — Ambulatory Visit (INDEPENDENT_AMBULATORY_CARE_PROVIDER_SITE_OTHER): Payer: Medicare HMO

## 2020-02-18 ENCOUNTER — Telehealth: Payer: Self-pay

## 2020-02-18 DIAGNOSIS — Z7901 Long term (current) use of anticoagulants: Secondary | ICD-10-CM

## 2020-02-18 LAB — POCT INR: INR: 2.7 (ref 2.0–3.0)

## 2020-02-18 MED ORDER — WARFARIN SODIUM 6 MG PO TABS: ORAL_TABLET | ORAL | 1 refills | Status: DC

## 2020-02-18 NOTE — Telephone Encounter (Signed)
Refill sent.

## 2020-02-18 NOTE — Addendum Note (Signed)
Addended by: Wilfred Lacy L on: 02/18/2020 11:00 AM   Modules accepted: Orders

## 2020-02-18 NOTE — Progress Notes (Signed)
Maintain current dose

## 2020-02-18 NOTE — Progress Notes (Signed)
Pt here for INR check per [facility], AGNP.  Goal INR = 2.0- 3.0  Last INR = 2.6  Pt currently takes Coumadin 6 mg on Sunday, Monday, Tuesday, and Wednesday. Takes Coumadin 9 mg on Thursday, Friday, and Saturday.   Date of last Coumadin dose = 02/18/20  Pt denies recent antibiotics, no dietary changes and no unusual bruising / bleeding.  INR today = 2.7  Patient advised to return in 1 month for a repeat INR. Appt scheduled 03/18/20 @ 1000.

## 2020-02-18 NOTE — Telephone Encounter (Signed)
Refill for Warfarin requested by patient.  Last filled 01/27/20// #36 // refill: 1  Contacted [facility]. Spoke to [place]. One (1) refill remaining. Patient notified and verbalized understanding.

## 2020-02-19 ENCOUNTER — Inpatient Hospital Stay: Payer: Medicare Other | Admitting: Internal Medicine

## 2020-03-01 ENCOUNTER — Other Ambulatory Visit: Payer: Self-pay

## 2020-03-01 ENCOUNTER — Other Ambulatory Visit: Payer: Medicare Other | Admitting: Hospice

## 2020-03-01 DIAGNOSIS — J449 Chronic obstructive pulmonary disease, unspecified: Secondary | ICD-10-CM

## 2020-03-01 DIAGNOSIS — Z515 Encounter for palliative care: Secondary | ICD-10-CM

## 2020-03-01 NOTE — Progress Notes (Signed)
[facility] Consult Note Telephone: [phone]  Fax: [phone]  PATIENT NAME: Jared Stevens DOB: August 24, 1950 MRN: [id number]  PRIMARY CARE PROVIDER:   Flossie Buffy, NP [place], Jared Brooke, NP [address] [place],  [place] [address]  REFERRING PROVIDER: Lorayne Stevens Jared Brooke, NP [name], Jared Brooke, NP [place],  [place] [address] REFERRING PROVIDER:Smith, Malva Limes., FNP   RESPONSIBLE PARTY:Jared Stevens [phone] Jared Stevens, Jared Stevens, daughter [phone]   RECOMMENDATIONS/PLAN:  Advance Care Planning: Advance care planning discussions included the value and importance of advance care planning, exploration of goals of care in the event of a sudden illness/injury all progresssion of ongoing disease.  It also included identification of the healthcare agent, exploration of personal/cultural/spiritual beliefs that might influence advance care planning/medical decisions, and review and updates or completion of advance directive document.  FULL code: Patient elects to  remain a FULLcode after discussion on ramifications and implications.   GOALS of care: Goals of care is to maximize quality of life and symptom management. Patient wishes to continue to live independently as he presently does.  Ongoingdiscussions oncounseling and education dealing with the complex and emotionally intense issues of symptom management and palliative care in the setting of serious and potentially life-threatening illness.Patient shared that he is a man of faith; his faith helps him to life's difficulties and challenges. Palliative care team will continue to support patient, patient's family, and medical team.   Follow BS:[id number] care will continue to follow patient for goals of care clarification and symptom management.Next visit in two months.   Symptom management:Patient in no medical acuity; no  significant changes since last visit. CT scan  Of lungs Sept 16 2021- Epic report shows no significant interval change in post treatment appearance of a hypodense mass of the right hilum. 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 No evidence of recurrent or metastatic disease in the chest. He denies pain/discomfort.No respiratory distress, no recent COPD flare since May 2020;no edema; he is continuing to take his breathing treatments as ordered,  Vit B6 And B12.  Patient saw PCP 02/18/2020 and INR check was 2.6; next visit scheduled for 03/18/2020. He denies bleeding, no palpitations.   He also sees his Oncologist every 6 months for Lung CA; had completed chemo/radiation; patient on remission. He reports intermittent short term memory difficulty since CVA 09/2018.He is compliant with his medications.Patient with no acute complaint or concerns at this time.Encouraged ongoing care.  I spent50 minutes providing this consultation;time includes chart review and documentation. More than 50% of the time in this consultation was spent on coordinating communication.  HISTORY OF PRESENT ILLNESS:Jared Atkinsis a 69 y.o.year oldmalewith multiple medical problems including CVA, CHF, HLD, COPD, lung cancer. Palliative Care was asked to help address goals of care.  CODE STATUS:Full  PPS:60% HOSPICE ELIGIBILITY/DIAGNOSIS: TBD  PAST MEDICAL HISTORY:  Past Medical History:  Diagnosis Date  . Chronic obstructive pulmonary disease   . Combined systolic and diastolic CHF    Mixed Ischemic/Non-Ischemic CM // Echo 09/2018: EF 15 // cMRI 10/2018: EF 11, inf scar // Echo 12/2018: EF 20-25, Gr 3 DD // Not a candidate for ICD due to comorbid illnesses   . Coronary artery disease    cath 09/2018: RCA chronically occluded >> Med Rx  . 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 Hx of recurrent R pleural  effusion    s/p thoracentesis  . Hypercholesteremia   . Lung cancer ([facility]) 03/2016  . Mixed Ischemic and Non-Ischemic Cardiomyopathy   . Non-small cell carcinoma of lung   . Nonsustained ventricular tachycardia     SOCIAL HX:  Social History   Tobacco Use  . Smoking status: Current Some Day Smoker  . Smokeless tobacco: Never Used  Substance Use Topics  . Alcohol use: Not Currently    ALLERGIES: No Known Allergies   PERTINENT MEDICATIONS:  Outpatient Encounter Medications as of 03/01/2020  Medication Sig  . amitriptyline (ELAVIL) 50 MG tablet Take 50 mg by mouth at bedtime as needed.   Marland Kitchen atorvastatin (LIPITOR) 80 MG tablet Take 1 tablet (80 mg total) by mouth daily.  . carvedilol (COREG) 3.125 MG tablet Take 1 tablet (3.125 mg total) by mouth 2 (two) times daily.  . Cyanocobalamin (B-12) 2500 MCG SUBL Place under the tongue.  . digoxin (LANOXIN) 0.125 MG tablet Take 1 tablet (0.125 mg total) by mouth daily. Prescribed by Jared Folks, NP on 10/20/19  . furosemide (LASIX) 40 MG tablet Take 1 tablet (40 mg total) by mouth daily.  Marland Kitchen latanoprost (XALATAN) 0.005 % ophthalmic solution Place 1 drop into both eyes 2 (two) times daily.   Marland Kitchen spironolactone (ALDACTONE) 25 MG tablet Take 0.5 tablets (12.5 mg total) by mouth every other day. Changed by Jared Folks NP on 08/09/19  . SYMBICORT 160-4.5 MCG/ACT inhaler INL 2 PFS PO BID IN THE MORNING AND IN THE EVE  . warfarin (COUMADIN) 6 MG tablet Take 1tab on Sun-Wed and 1.5tab Thur-Sat.   No facility-administered encounter medications on file as of 03/01/2020.    PHYSICAL EXAM/ROS:  General: NAD, frail appearing, thin, cooperative Cardiovascular: regular rate and rhythm; denies chest pain Pulmonary: No coughing, no shortness of breath Abdomen: soft, nontender, + bowel sounds GU: no suprapubic tenderness Extremities: no edema, no joint deformities Skin: no rashes to exposed skin Neurological: Weakness but otherwise nonfocal  Teodoro Spray,  NP

## 2020-03-03 ENCOUNTER — Telehealth: Payer: Self-pay | Admitting: Nurse Practitioner

## 2020-03-03 NOTE — Telephone Encounter (Signed)
Patient states that he wants to speak to the nurse regarding "his situation". He would not give any further details, just requested a call back. Please give him a call at [phone].

## 2020-03-04 NOTE — Telephone Encounter (Signed)
LVM for patient to return call. 

## 2020-03-09 ENCOUNTER — Ambulatory Visit (INDEPENDENT_AMBULATORY_CARE_PROVIDER_SITE_OTHER): Payer: Medicare Other

## 2020-03-09 VITALS — Ht 73.0 in | Wt 140.0 lb

## 2020-03-09 DIAGNOSIS — Z0000 Encounter for general adult medical examination without abnormal findings: Secondary | ICD-10-CM

## 2020-03-09 NOTE — Patient Instructions (Signed)
Jared Stevens , Thank you for taking time to complete your Medicare Wellness Visit. I appreciate your ongoing commitment to your health goals. Please review the following plan we discussed and let me know if I can assist you in the future.   Screening recommendations/referrals: Colonoscopy: Per our conversation, completed 11/2018. Recommended yearly ophthalmology/optometry visit for glaucoma screening and checkup Recommended yearly dental visit for hygiene and checkup  Vaccinations: Influenza vaccine: Declined Pneumococcal vaccine: Please bring documentation of vaccine to your next office visit if available. Tdap vaccine: Please bring documentation of vaccine to your next office visit if available. Shingles vaccine: Declined   Covid-19: Completed vaccines  Advanced directives: Please bring a copy for your chart.  Conditions/risks identified: See problem list  Next appointment: Follow up in one year for your annual wellness visit. 03/15/2021 @ 1:30pm.  Preventive Care 65 Years and Older, Male Preventive care refers to lifestyle choices and visits with your health care provider that can promote health and wellness. What does preventive care include?  A yearly physical exam. This is also called an annual well check.  Dental exams once or twice a year.  Routine eye exams. Ask your health care provider how often you should have your eyes checked.  Personal lifestyle choices, including:  Daily care of your teeth and gums.  Regular physical activity.  Eating a healthy diet.  Avoiding tobacco and drug use.  Limiting alcohol use.  Practicing safe sex.  Taking low doses of aspirin every day.  Taking vitamin and mineral supplements as recommended by your health care provider. What happens during an annual well check? The services and screenings done by your health care provider during your annual well check will depend on your age, overall health, lifestyle risk factors, and family  history of disease. Counseling  Your health care provider may ask you questions about your:  Alcohol use.  Tobacco use.  Drug use.  Emotional well-being.  Home and relationship well-being.  Sexual activity.  Eating habits.  History of falls.  Memory and ability to understand (cognition).  Work and work Statistician. Screening  You may have the following tests or measurements:  Height, weight, and BMI.  Blood pressure.  Lipid and cholesterol levels. These may be checked every 5 years, or more frequently if you are over 45 years old.  Skin check.  Lung cancer screening. You may have this screening every year starting at age 79 if you have a 30-pack-year history of smoking and currently smoke or have quit within the past 15 years.  Fecal occult blood test (FOBT) of the stool. You may have this test every year starting at age 36.  Flexible sigmoidoscopy or colonoscopy. You may have a sigmoidoscopy every 5 years or a colonoscopy every 10 years starting at age 74.  Prostate cancer screening. Recommendations will vary depending on your family history and other risks.  Hepatitis C blood test.  Hepatitis B blood test.  Sexually transmitted disease (STD) testing.  Diabetes screening. This is done by checking your blood sugar (glucose) after you have not eaten for a while (fasting). You may have this done every 1-3 years.  Abdominal aortic aneurysm (AAA) screening. You may need this if you are a current or former smoker.  Osteoporosis. You may be screened starting at age 74 if you are at high risk. Talk with your health care provider about your test results, treatment options, and if necessary, the need for more tests. Vaccines  Your health care provider may recommend  certain vaccines, such as:  Influenza vaccine. This is recommended every year.  Tetanus, diphtheria, and acellular pertussis (Tdap, Td) vaccine. You may need a Td booster every 10 years.  Zoster vaccine.  You may need this after age 24.  Pneumococcal 13-valent conjugate (PCV13) vaccine. One dose is recommended after age 56.  Pneumococcal polysaccharide (PPSV23) vaccine. One dose is recommended after age 72. Talk to your health care provider about which screenings and vaccines you need and how often you need them. This information is not intended to replace advice given to you by your health care provider. Make sure you discuss any questions you have with your health care provider. Document Released: 06/11/2015 Document Revised: 02/02/2016 Document Reviewed: 03/16/2015 Elsevier Interactive Patient Education  2017 [place] Prevention in the Home Falls can cause injuries. They can happen to people of all ages. There are many things you can do to make your home safe and to help prevent falls. What can I do on the outside of my home?  Regularly fix the edges of walkways and driveways and fix any cracks.  Remove anything that might make you trip as you walk through a door, such as a raised step or threshold.  Trim any bushes or trees on the path to your home.  Use bright outdoor lighting.  Clear any walking paths of anything that might make someone trip, such as rocks or tools.  Regularly check to see if handrails are loose or broken. Make sure that both sides of any steps have handrails.  Any raised decks and porches should have guardrails on the edges.  Have any leaves, snow, or ice cleared regularly.  Use sand or salt on walking paths during winter.  Clean up any spills in your garage right away. This includes oil or grease spills. What can I do in the bathroom?  Use night lights.  Install grab bars by the toilet and in the tub and shower. Do not use towel bars as grab bars.  Use non-skid mats or decals in the tub or shower.  If you need to sit down in the shower, use a plastic, non-slip stool.  Keep the floor dry. Clean up any water that spills on the floor as soon  as it happens.  Remove soap buildup in the tub or shower regularly.  Attach bath mats securely with double-sided non-slip rug tape.  Do not have throw rugs and other things on the floor that can make you trip. What can I do in the bedroom?  Use night lights.  Make sure that you have a light by your bed that is easy to reach.  Do not use any sheets or blankets that are too big for your bed. They should not hang down onto the floor.  Have a firm chair that has side arms. You can use this for support while you get dressed.  Do not have throw rugs and other things on the floor that can make you trip. What can I do in the kitchen?  Clean up any spills right away.  Avoid walking on wet floors.  Keep items that you use a lot in easy-to-reach places.  If you need to reach something above you, use a strong step stool that has a grab bar.  Keep electrical cords out of the way.  Do not use floor polish or wax that makes floors slippery. If you must use wax, use non-skid floor wax.  Do not have throw rugs and other  things on the floor that can make you trip. What can I do with my stairs?  Do not leave any items on the stairs.  Make sure that there are handrails on both sides of the stairs and use them. Fix handrails that are broken or loose. Make sure that handrails are as long as the stairways.  Check any carpeting to make sure that it is firmly attached to the stairs. Fix any carpet that is loose or worn.  Avoid having throw rugs at the top or bottom of the stairs. If you do have throw rugs, attach them to the floor with carpet tape.  Make sure that you have a light switch at the top of the stairs and the bottom of the stairs. If you do not have them, ask someone to add them for you. What else can I do to help prevent falls?  Wear shoes that:  Do not have high heels.  Have rubber bottoms.  Are comfortable and fit you well.  Are closed at the toe. Do not wear sandals.  If  you use a stepladder:  Make sure that it is fully opened. Do not climb a closed stepladder.  Make sure that both sides of the stepladder are locked into place.  Ask someone to hold it for you, if possible.  Clearly mark and make sure that you can see:  Any grab bars or handrails.  First and last steps.  Where the edge of each step is.  Use tools that help you move around (mobility aids) if they are needed. These include:  Canes.  Walkers.  Scooters.  Crutches.  Turn on the lights when you go into a dark area. Replace any light bulbs as soon as they burn out.  Set up your furniture so you have a clear path. Avoid moving your furniture around.  If any of your floors are uneven, fix them.  If there are any pets around you, be aware of where they are.  Review your medicines with your doctor. Some medicines can make you feel dizzy. This can increase your chance of falling. Ask your doctor what other things that you can do to help prevent falls. This information is not intended to replace advice given to you by your health care provider. Make sure you discuss any questions you have with your health care provider. Document Released: 03/11/2009 Document Revised: 10/21/2015 Document Reviewed: 06/19/2014 Elsevier Interactive Patient Education  2017 [employer].

## 2020-03-09 NOTE — Progress Notes (Addendum)
Subjective:   Jared Stevens is a 69 y.o. male who presents for an Initial Medicare Annual Wellness Visit.  I connected with Jared Stevens today by telephone and verified that I am speaking with the correct person using two identifiers. Location patient: home Location provider: work Persons participating in the virtual visit: patient, Marine scientist.    I discussed the limitations, risks, security and privacy concerns of performing an evaluation and management service by telephone and the availability of in person appointments. I also discussed with the patient that there may be a patient responsible charge related to this service. The patient expressed understanding and verbally consented to this telephonic visit.    Interactive audio and video telecommunications were attempted between this provider and patient, however failed, due to patient having technical difficulties OR patient did not have access to video capability.  We continued and completed visit with audio only.  Some vital signs may be absent or patient reported.   Time Spent with patient on telephone encounter: 30 minutes  Review of Systems     Cardiac Risk Factors include: male gender;advanced age (>29men, >44 women);dyslipidemia     Objective:    Today's Vitals   03/09/20 1330  Weight: 140 lb (63.5 kg)  Height: 6\' 1"  (1.854 m)   Body mass index is 18.47 kg/m.  Advanced Directives 03/09/2020 10/30/2018 10/25/2018 10/21/2018 09/27/2018 09/05/2018 05/06/2018  Does Patient Have a Medical Advance Directive? Yes Yes - No No No No  Type of Paramedic of [place];Living will (No Data) - - - - -  Does patient want to make changes to medical advance directive? - No - Patient declined - - - - -  Copy of [place] in Chart? No - copy requested - - - - - -  Would patient like information on creating a medical advance directive? - No - Patient declined No - Patient declined - No - Patient declined No -  Patient declined -    Current Medications (verified) Outpatient Encounter Medications as of 03/09/2020  Medication Sig  . amitriptyline (ELAVIL) 50 MG tablet Take 50 mg by mouth at bedtime as needed.   . Cyanocobalamin (B-12) 2500 MCG SUBL Place under the tongue.  . digoxin (LANOXIN) 0.125 MG tablet Take 1 tablet (0.125 mg total) by mouth daily. Prescribed by Dustin Folks, NP on 10/20/19  . furosemide (LASIX) 40 MG tablet Take 1 tablet (40 mg total) by mouth daily.  Marland Kitchen latanoprost (XALATAN) 0.005 % ophthalmic solution Place 1 drop into both eyes 2 (two) times daily.   Marland Kitchen spironolactone (ALDACTONE) 25 MG tablet Take 0.5 tablets (12.5 mg total) by mouth every other day. Changed by Dustin Folks NP on 08/09/19  . SYMBICORT 160-4.5 MCG/ACT inhaler INL 2 PFS PO BID IN THE MORNING AND IN THE EVE  . warfarin (COUMADIN) 6 MG tablet Take 1tab on Sun-Wed and 1.5tab Thur-Sat.  Marland Kitchen atorvastatin (LIPITOR) 80 MG tablet Take 1 tablet (80 mg total) by mouth daily.  . carvedilol (COREG) 3.125 MG tablet Take 1 tablet (3.125 mg total) by mouth 2 (two) times daily.   No facility-administered encounter medications on file as of 03/09/2020.    Allergies (verified) Patient has no known allergies.   History: Past Medical History:  Diagnosis Date  . Chronic obstructive pulmonary disease   . Combined systolic and diastolic CHF    Mixed Ischemic/Non-Ischemic CM // Echo 09/2018: EF 15 // cMRI 10/2018: EF 11, inf scar // Echo 12/2018: EF 20-25, Gr  3 DD // Not a candidate for ICD due to comorbid illnesses   . Coronary artery disease    cath 09/2018: RCA chronically occluded >> Med Rx  . 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 Hx of recurrent R pleural effusion    s/p thoracentesis  . Hypercholesteremia   . Lung cancer ([place]) 03/2016  . Mixed Ischemic and Non-Ischemic Cardiomyopathy   . Non-small cell carcinoma of lung   . Nonsustained ventricular tachycardia    Past Surgical  History:  Procedure Laterality Date  . Arm Surgery    . HERNIA REPAIR    . IR ANGIO INTRA EXTRACRAN SEL COM CAROTID INNOMINATE UNI L MOD SED  10/21/2018  . IR ANGIO VERTEBRAL SEL SUBCLAVIAN INNOMINATE UNI R MOD SED  10/21/2018  . IR CT HEAD LTD  10/21/2018  . IR PERCUTANEOUS ART THROMBECTOMY/INFUSION INTRACRANIAL INC DIAG ANGIO  10/21/2018  . IR THORACENTESIS ASP PLEURAL SPACE W/IMG GUIDE  09/27/2018  . RADIOLOGY WITH ANESTHESIA N/A 10/21/2018   Procedure: RADIOLOGY WITH ANESTHESIA;  Surgeon: Luanne Bras, MD;  Location: [facility];  Service: Radiology;  Laterality: N/A;  . REPLANTATION THUMB    . RIGHT/LEFT HEART CATH AND CORONARY ANGIOGRAPHY N/A 10/01/2018   Procedure: RIGHT/LEFT HEART CATH AND CORONARY ANGIOGRAPHY;  Surgeon: Troy Sine, MD;  Location: [facility];  Service: Cardiovascular;  Laterality: N/A;   Family History  Problem Relation Age of Onset  . Aneurysm Mother    Social History   Socioeconomic History  . Marital status: Widowed    Spouse name: Not on file  . Number of children: Not on file  . Years of education: Not on file  . Highest education level: Not on file  Occupational History  . Not on file  Tobacco Use  . Smoking status: Current Some Day Smoker  . Smokeless tobacco: Never Used  Vaping Use  . Vaping Use: Never used  Substance and Sexual Activity  . Alcohol use: Not Currently  . Drug use: Not Currently  . Sexual activity: Not on file  Other Topics Concern  . Not on file  Social History Narrative  . Not on file   Social Determinants of Health   Financial Resource Strain: Low Risk   . Difficulty of Paying Living Expenses: Not hard at all  Food Insecurity: No Food Insecurity  . Worried About Charity fundraiser in the Last Year: Never true  . Ran Out of Food in the Last Year: Never true  Transportation Needs: No Transportation Needs  . Lack of Transportation (Medical): No  . Lack of Transportation (Non-Medical): No  Physical Activity:  Sufficiently Active  . Days of Exercise per Week: 7 days  . Minutes of Exercise per Session: 40 min  Stress: No Stress Concern Present  . Feeling of Stress : Not at all  Social Connections: Socially Isolated  . Frequency of Communication with Friends and Family: More than three times a week  . Frequency of Social Gatherings with Friends and Family: More than three times a week  . Attends Religious Services: Never  . Active Member of Clubs or Organizations: No  . Attends Archivist Meetings: Never  . Marital Status: Widowed    Tobacco Counseling Ready to quit: Not Answered Counseling given: Not Answered   Clinical Intake:  Pre-visit preparation completed: Yes  Pain : No/denies pain     Nutritional Status: BMI <19  Underweight Nutritional Risks: None Diabetes: No  How often do you need to have someone help you when you read instructions, pamphlets, or other written materials from your doctor or pharmacy?: 1 - Never What is the last grade level you completed in school?: 12th grade  Diabetic?No  Interpreter Needed?: No  Information entered by :: Caroleen Hamman LPN   Activities of Daily Living In your present state of health, do you have any difficulty performing the following activities: 03/09/2020  Hearing? N  Vision? N  Difficulty concentrating or making decisions? Y  Comment occasionally forgets things like appt's  Walking or climbing stairs? N  Dressing or bathing? N  Doing errands, shopping? N  Preparing Food and eating ? N  Using the Toilet? N  In the past six months, have you accidently leaked urine? N  Do you have problems with loss of bowel control? N  Managing your Medications? N  Managing your Finances? N  Housekeeping or managing your Housekeeping? N  Some recent data might be hidden    Patient Care Team: [name], [name], NP as PCP - General (Internal Medicine) Nahser, Wonda Cheng, MD as PCP - Cardiology (Cardiology)  Indicate any  recent Medical Services you may have received from other than [facility] providers in the past year (date may be approximate).     Assessment:   This is a routine wellness examination for [place].  Hearing/Vision screen  Hearing Screening   125Hz  250Hz  500Hz  1000Hz  2000Hz  3000Hz  4000Hz  6000Hz  8000Hz   Right ear:           Left ear:           Comments: No issues  Vision Screening Comments: Wears glasses Last eye exam-2021-Dr. Delman Cheadle  Dietary issues and exercise activities discussed: Current Exercise Habits: Home exercise routine, Type of exercise: walking, Time (Minutes): 25, Frequency (Times/Week): 7, Weekly Exercise (Minutes/Week): 175, Intensity: Mild, Exercise limited by: None identified  Goals    . Patient Stated     Would like to gain some weight      Depression Screen PHQ 2/9 Scores 03/09/2020 01/01/2020 05/06/2018  PHQ - 2 Score 0 0 0    Fall Risk Fall Risk  03/09/2020 01/01/2020 05/06/2018  Falls in the past year? 1 1 1   Comment - - tripped on the curb  Number falls in past yr: 1 1 0  Injury with Fall? 0 1 1  Comment - - broke his collar bone, hands and chest now numb  Risk for fall due to : History of fall(s) - -  Follow up Falls prevention discussed - -    Any stairs in or around the home? No  Home free of loose throw rugs in walkways, pet beds, electrical cords, etc? Yes  Adequate lighting in your home to reduce risk of falls? Yes   ASSISTIVE DEVICES UTILIZED TO PREVENT FALLS:  Life alert? No  Use of a cane, walker or w/c? Yes Grab bars in the bathroom? Yes  Shower chair or bench in shower? No  Elevated toilet seat or a handicapped toilet? No   TIMED UP AND GO:  Was the test performed? No . Phone visit  Cognitive Function:No cognitive impairment noted.        Immunizations Immunization History  Administered Date(s) Administered  . Moderna SARS-COVID-2 Vaccination 10/23/2019, 11/20/2019    TDAP status: Due, Education has been provided regarding the  importance of this vaccine. Advised may receive this vaccine at local pharmacy or Health Dept. Aware to  provide a copy of the vaccination record if obtained from local pharmacy or Health Dept. Verbalized acceptance and understanding.   Flu Vaccine status: Declined, Education has been provided regarding the importance of this vaccine but patient still declined. Advised may receive this vaccine at local pharmacy or Health Dept. Aware to provide a copy of the vaccination record if obtained from local pharmacy or Health Dept. Verbalized acceptance and understanding.   Pneumococcal vaccine status: Patient states he has had a pneumonia vaccine. Unsure which one or date.  Covid-19 vaccine status: Completed vaccines  Qualifies for Shingles Vaccine? Yes   Zostavax completed No   Shingrix Completed?: No.    Education has been provided regarding the importance of this vaccine. Patient has been advised to call insurance company to determine out of pocket expense if they have not yet received this vaccine. Advised may also receive vaccine at local pharmacy or Health Dept. Verbalized acceptance and understanding.  Screening Tests Health Maintenance  Topic Date Due  . Hepatitis C Screening  Never done  . TETANUS/TDAP  Never done  . COLONOSCOPY  Never done  . PNA vac Low Risk Adult (1 of 2 - PCV13) Never done  . INFLUENZA VACCINE  Never done  . COVID-19 Vaccine  Completed    Health Maintenance  Health Maintenance Due  Topic Date Due  . Hepatitis C Screening  Never done  . TETANUS/TDAP  Never done  . COLONOSCOPY  Never done  . PNA vac Low Risk Adult (1 of 2 - PCV13) Never done  . INFLUENZA VACCINE  Never done    Colorectal cancer screening: Completed 11/2018. Repeat every 10 years Per Patient  Lung Cancer Screening: (Low Dose CT Chest recommended if Age 15-80 years, 30 pack-year currently smoking OR have quit w/in 15years.) does not qualify.     Additional Screening:  Hepatitis C Screening:  does qualify; Discuss with PCP  Vision Screening: Recommended annual ophthalmology exams for early detection of glaucoma and other disorders of the eye. Is the patient up to date with their annual eye exam?  Yes  Who is the provider or what is the name of the office in which the patient attends annual eye exams? Dr. Delman Cheadle   Dental Screening: Recommended annual dental exams for proper oral hygiene  Community Resource Referral / Chronic Care Management: CRR required this visit?  No   CCM required this visit?  No      Plan:     I have personally reviewed and noted the following in the patient's chart:   . Medical and social history . Use of alcohol, tobacco or illicit drugs  . Current medications and supplements . Functional ability and status . Nutritional status . Physical activity . Advanced directives . List of other physicians . Hospitalizations, surgeries, and ER visits in previous 12 months . Vitals . Screenings to include cognitive, depression, and falls . Referrals and appointments  In addition, I have reviewed and discussed with patient certain preventive protocols, quality metrics, and best practice recommendations. A written personalized care plan for preventive services as well as general preventive health recommendations were provided to patient.    Due to this being a telephonic visit, the after visit summary with patients personalized plan was offered to patient via mail or my-chart.  Per request, patient was mailed a copy of [place], LPN   [id number]  Nurse Health Advisor  Nurse Notes: None

## 2020-03-15 NOTE — Progress Notes (Signed)
Medical screening examination/treatment/procedure(s) were performed by the Wellness Coach,LPN. As primary care provider I was immediately available for consulation/collaboration. I agree with above documentation. Wilfred Lacy, AGNP-C

## 2020-03-17 ENCOUNTER — Other Ambulatory Visit: Payer: Self-pay

## 2020-03-18 ENCOUNTER — Ambulatory Visit: Payer: Medicare HMO

## 2020-03-18 ENCOUNTER — Other Ambulatory Visit: Payer: Self-pay

## 2020-03-18 ENCOUNTER — Ambulatory Visit (INDEPENDENT_AMBULATORY_CARE_PROVIDER_SITE_OTHER): Payer: Medicare Other

## 2020-03-18 VITALS — Temp 97.1°F | Ht 73.0 in | Wt 137.6 lb

## 2020-03-18 DIAGNOSIS — Z7901 Long term (current) use of anticoagulants: Secondary | ICD-10-CM | POA: Diagnosis not present

## 2020-03-18 DIAGNOSIS — I6601 Occlusion and stenosis of right middle cerebral artery: Secondary | ICD-10-CM | POA: Diagnosis not present

## 2020-03-18 LAB — POCT INR: INR: 1.9 — AB (ref 2.0–3.0)

## 2020-03-18 MED ORDER — WARFARIN SODIUM 6 MG PO TABS: ORAL_TABLET | ORAL | 1 refills | Status: DC

## 2020-03-18 NOTE — Progress Notes (Signed)
Pt here for INR check per Wilfred Lacy, NP  Goal INR =2.0 -3.0  Last INR =2.7  Pt currently takes Coumadin 6 mg on Sunday, Tuesday, and Wednesday. Takes Coumadin 9 mg on Thursday, Friday, and Saturday.  Date of last Coumadin dose = 03/18/20  Pt denies recent antibiotics, no dietary changes and no unusual bruising / bleeding.  INR today = 1.9   Pt advised per Provider to: Take 2 tabs(12 mg) this Friday (10/22)  and Saturday (10/23).  Then, 1.5 tabs (9 mg ) this Sunday (10/24) and Monday (10/25).  Then, start 1 tab (6 mg) on Tuesdays and Wednesdays and 1.5 tabs (9mg ) on Thursday through Monday.  Recheck INR in two weeks.

## 2020-03-19 ENCOUNTER — Telehealth: Payer: Self-pay | Admitting: Nurse Practitioner

## 2020-03-19 ENCOUNTER — Other Ambulatory Visit: Payer: Self-pay | Admitting: Nurse Practitioner

## 2020-03-19 DIAGNOSIS — Z7901 Long term (current) use of anticoagulants: Secondary | ICD-10-CM

## 2020-03-19 NOTE — Telephone Encounter (Signed)
Informed patient that Rx was faxed to his pharmacy this morning and should be ready for him to pick up.

## 2020-03-19 NOTE — Telephone Encounter (Signed)
Patient is calling and requesting a refill for Warfarin sent to [facility] on [address], please advise.

## 2020-03-22 ENCOUNTER — Telehealth: Payer: Self-pay | Admitting: *Deleted

## 2020-03-22 NOTE — Telephone Encounter (Signed)
Our office received clearance request from [facility]. I called the dental office as to needing to confirm there number of teeth to be extracted, as well as type of anesthesia and name of MD doing procedure.

## 2020-03-24 NOTE — Telephone Encounter (Signed)
° °  [facility] Pre-operative Risk Assessment    HEARTCARE STAFF: - Please ensure there is not already an duplicate clearance open for this procedure. - Under Visit Info/Reason for Call, type in Other and utilize the format Clearance MM/DD/YY or Clearance TBD. Do not use dashes or single digits. - If request is for dental extraction, please clarify the # of teeth to be extracted.  Request for surgical clearance:  1. What type of surgery is being performed? 3 TEETH TO BE EXTRACTED    2. When is this surgery scheduled? TBD   3. What type of clearance is required (medical clearance vs. Pharmacy clearance to hold med vs. Both)? PHARM  4. Are there any medications that need to be held prior to surgery and how long? WARFARIN   5. Practice name and name of physician performing surgery? [facility]; DR. Joellen Jersey HEALD  6. What is the office phone number? [phone]   7.   What is the office fax number? [phone]  8.   Anesthesia type (None, local, MAC, general) ? LOCAL   Julaine Hua 03/24/2020, 9:02 AM  _________________________________________________________________   (provider comments below)

## 2020-03-24 NOTE — Telephone Encounter (Signed)
Patient with diagnosis of CVA in the setting of cardioembolism secondary to severe LV dysfunction in 2020 on warfarin for anticoagulation.    Procedure: 3 Teeth Extractions Date of procedure: TBD  [facility] does not manage this patient's warfarin/INRs.  Patient INRs are managed by PCP at [facility].  In May 2021, patient had hernia surgery and Dr Acie Fredrickson recommended a Lovenox bridge due to recurrent strokes but deferred to [facility] for the [employer].

## 2020-03-24 NOTE — Telephone Encounter (Signed)
Pharmacy, I know we normally don't hold blood thinners for simple teeth extractions. This patient is on coumadin and having 3 teeth extracted. Wondering if there would be different recommendations? Thanks!

## 2020-03-24 NOTE — Telephone Encounter (Signed)
Please call back Jared Stevens to inform then that we do not manage patient's warfarin. It is managed by his PCP/[facility], and in the past they have made final recommendations regarding pre-operative pharmacological recommendations.   Dr. Acie Fredrickson did recommend lovenox bridging given his history of stroke but will ultimately defer final decision to PCP.   Thanks! Gratia Disla Kathlen Mody, PA-C

## 2020-03-24 NOTE — Telephone Encounter (Signed)
Dr. Acie Fredrickson, this patient is having 3 dental teeth extractions and is on warfarin for CVA history. We do not manage this patient's warfarin. However, in the past patient had a hernia repair and you recommended lovenox bridging (due to history of strokes) but ultimately left it up the PCP for the final decision. Wondering if there was any recommendation from your end? Otherwise will defer to PCP.   PMH: mixed ischemic and NICM 2/2 HTN, chemotherapy, cocaine, CAD with occluded RCA by cath in May 2020, HLD, lung cancer with recurrent rt pleural effusion, prior CVA in May 2020 treated with TPA and thrombectomy.   Thank you, Mallerie Blok Kathlen Mody, PA-C

## 2020-03-24 NOTE — Telephone Encounter (Signed)
  Given his hx of strokes, I would also recommend bridging with Lovenox but I will defer to his primary MD for this decision .

## 2020-03-26 NOTE — Telephone Encounter (Signed)
Left message for [place] they will need to obtain clearance for Warfarin from pt's PCP, see clearance notes. I will re-fax notes today with this update as well.

## 2020-03-31 ENCOUNTER — Other Ambulatory Visit: Payer: Self-pay

## 2020-04-01 ENCOUNTER — Ambulatory Visit (INDEPENDENT_AMBULATORY_CARE_PROVIDER_SITE_OTHER): Payer: Medicare Other

## 2020-04-01 DIAGNOSIS — Z7901 Long term (current) use of anticoagulants: Secondary | ICD-10-CM | POA: Diagnosis not present

## 2020-04-01 LAB — POCT INR: INR: 5.4 — AB (ref 2.0–3.0)

## 2020-04-01 NOTE — Progress Notes (Signed)
Pt here for INR check per Wilfred Lacy, NP  Goal INR = 2.0- 3.0  Last INR =1.9  Pt currently takes Coumadin  1 tab (6 mg) on Sunday - Wednesdays and 1.5 tabs (9mg ) on Thursday - Saturday.   He was supposed to be taking it: Coumadin 1 tab (6 mg) on Tuesdays and Wednesdays and 1.5 tabs (9mg ) on Thursday through Monday.   Date of last Coumadin dose = 04/01/20  Pt denies recent antibiotics, no dietary changes and no unusual bruising / bleeding.  INR today = 5.4  Pt advised per Dr Bryan Lemma:  To hold Coumadin for 5 days (Friday- Tuesday)  Then restart Coumadin with the following directions:  Coumadin 1 tab (6 mg) on Tuesdays and Wednesdays and 1.5 tabs (9mg ) on Thursday through Monday.   Recheck in 3 weeks.

## 2020-04-01 NOTE — Progress Notes (Signed)
I reviewed and agree with the documentation and plan as outlined below.

## 2020-04-05 ENCOUNTER — Telehealth: Payer: Self-pay

## 2020-04-05 NOTE — Telephone Encounter (Signed)
Mail print out to him.

## 2020-04-05 NOTE — Telephone Encounter (Signed)
Pt would like a call back informing him of what type of foods he should avoid while on medication Warfarin 6mg .  Pt would like to have a chart or something pertaining to Vitamin K foods, how much to consumer.  Please advise.  KL#[phone].

## 2020-04-05 NOTE — Telephone Encounter (Signed)
Patient notified and verbalized understanding Forms mailed out

## 2020-04-14 DIAGNOSIS — H5203 Hypermetropia, bilateral: Secondary | ICD-10-CM | POA: Diagnosis not present

## 2020-04-14 DIAGNOSIS — H401132 Primary open-angle glaucoma, bilateral, moderate stage: Secondary | ICD-10-CM | POA: Diagnosis not present

## 2020-04-19 ENCOUNTER — Other Ambulatory Visit: Payer: Self-pay

## 2020-04-20 ENCOUNTER — Ambulatory Visit (INDEPENDENT_AMBULATORY_CARE_PROVIDER_SITE_OTHER): Payer: Medicare Other

## 2020-04-20 DIAGNOSIS — Z7901 Long term (current) use of anticoagulants: Secondary | ICD-10-CM

## 2020-04-20 LAB — POCT INR: INR: 4.2 — AB (ref 2.0–3.0)

## 2020-04-20 NOTE — Progress Notes (Signed)
Pt here for INR check per Baldo Ash [name]  Goal INR = 2.0-3.0  Last INR = 5.4 on 04/01/20  Pt currently takes Coumadin 1 tab (6 mg) on Tuesdays and Wednesdays and 1.5 tabs (9mg ) on Thursday through Monday.   Date of last Coumadin dose = this morning 04/20/20  Pt denies recent antibiotics, no dietary changes and no unusual bruising / bleeding.  INR today = 4.2  Pt advised per Wilfred Lacy, NP to hold Coumadin Wednesday, Thursday, and Friday this week (11/24 - 11/26).  Then start 1.5 tabs (9mg ) on Saturday 11/27, then continue with 1.5 tabs (9mg ) every Friday, Saturday and Sunday and 1 tab (6mg ) every Tuesday, Wednesday, and Thursday.  Return for INR check in 3 weeks.

## 2020-04-20 NOTE — Patient Instructions (Signed)
Health Maintenance Due  Topic Date Due  . Hepatitis C Screening  Never done  . TETANUS/TDAP  Never done  . COLONOSCOPY  Never done  . PNA vac Low Risk Adult (1 of 2 - PCV13) Never done  . INFLUENZA VACCINE  Never done    Depression screen [facility] 2/9 03/09/2020 01/01/2020 05/06/2018  Decreased Interest 0 0 0  Down, Depressed, Hopeless 0 0 0  PHQ - 2 Score 0 0 0

## 2020-04-23 ENCOUNTER — Other Ambulatory Visit: Payer: Medicare Other | Admitting: Hospice

## 2020-04-23 ENCOUNTER — Other Ambulatory Visit: Payer: Self-pay

## 2020-04-23 DIAGNOSIS — Z515 Encounter for palliative care: Secondary | ICD-10-CM

## 2020-04-23 NOTE — Progress Notes (Signed)
THIS VISIT DID NOT HOLD.  On arrival, patient was not home for scheduled visit.  He did not come to the door; called him several times -not picked.  NP left him a voicemail with callback number.

## 2020-04-26 ENCOUNTER — Other Ambulatory Visit: Payer: Self-pay | Admitting: Nurse Practitioner

## 2020-04-26 ENCOUNTER — Other Ambulatory Visit: Payer: Self-pay | Admitting: Cardiovascular Disease

## 2020-04-26 DIAGNOSIS — Z7901 Long term (current) use of anticoagulants: Secondary | ICD-10-CM

## 2020-04-26 MED ORDER — CARVEDILOL 3.125 MG PO TABS
3.1250 mg | ORAL_TABLET | Freq: Two times a day (BID) | ORAL | 1 refills | Status: DC
Start: 2020-04-26 — End: 2021-01-19

## 2020-04-26 MED ORDER — WARFARIN SODIUM 6 MG PO TABS: ORAL_TABLET | ORAL | 1 refills | Status: DC

## 2020-04-26 NOTE — Telephone Encounter (Signed)
 *  STAT* If patient is at the pharmacy, call can be transferred to refill team.   1. Which medications need to be refilled? (please list name of each medication and dose if known)   carvedilol (COREG) 3.125 MG tablet  2. Which pharmacy/location (including street and city if local pharmacy) is medication to be sent to?  [id number] - [place], [place] - [address] AT [address]  3. Do they need a 30 day or 90 day supply? 90 days

## 2020-04-26 NOTE — Telephone Encounter (Signed)
Patient is calling to see if [facility] [address] and [address]. He also needs a refill on Carvedilol. If approved, please send in and call him at [phone] to let him know that it has been sent in.

## 2020-04-26 NOTE — Telephone Encounter (Signed)
Pt's medication was sent to pt's pharmacy as requested. Confirmation received.  °

## 2020-04-26 NOTE — Telephone Encounter (Signed)
Pt states he is in need of warfarin right now and not digoxin. Pt states he will be in need of digoxin in about 10 days. Pharmacy has been updated in his chart. Pt states he is unsure of who is prescribing his Carvedilol but knows he has a appointment with his cardiologist on 05/04/20 and he has enough to last him until then.

## 2020-05-04 ENCOUNTER — Ambulatory Visit: Payer: Medicare Other | Admitting: Physician Assistant

## 2020-05-04 NOTE — Progress Notes (Deleted)
Cardiology Office Note:    Date:  05/04/2020   ID:  Jared Stevens, DOB [birth year], MRN [id number]  PCP:  Flossie Buffy, NP  [facility] Cardiologist:  Mertie Moores, MD *** [facility] Electrophysiologist:  None   Referring MD: Flossie Buffy, NP   Chief Complaint:  No chief complaint on file.    Patient Profile:    Jared Stevens is a 69 y.o. male with:   Chronic systolic CHF ? 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 BP limits med titration ? Echocardiogram 09/2018: EF 15 ? cMRI 10/2018: EF 11, inf scar ? Echo 12/2018: EF 20-25, Gr 3 DD  Coronary artery disease ? 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 Hx of Recurrent R Pleural Effusion ? 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 2/2 to non-ischemic cardiomyopathy >> Coumadin Rx (managed by [facility])  NSVT  Aortic atherosclerosis   Prior CV studies: Echocardiogram 01/08/2019 EF 20-25, Gr 3 DD, normal RVSF, mild RAE  Cardiac MRI 10/28/2018 IMPRESSION: 1. 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 Mild RVE with hypokinesis RVEF 22% 3. Small but circumferential pericardial effusion 4. Large bilateral pleural effusions 5. No mural apical thrombus 6. Full thickness scar involving the mid and apical inferior wall with small area of subendocardial scar at apex 7. Mild MR 8. Dilated IVC 9. Marked dyssynchrony between the anterior and inferior walls  R/L Cardiac catheterization 10/01/2018 RCA prox 100 (L-R collats) EF 15  Echocardiogram 09/27/2018 EF 15, Gr 1 DD, severely reduced RVSF, trivial eff, mild MAC  History of Present Illness:    Jared Stevens was last seen by Dr. Acie Fredrickson in 6/21.  He is seen for f/u.  ***      Past Medical History:  Diagnosis Date  . Chronic obstructive pulmonary disease    . Combined systolic and diastolic CHF    Mixed Ischemic/Non-Ischemic CM // Echo 09/2018: EF 15 // cMRI 10/2018: EF 11, inf scar // Echo 12/2018: EF 20-25, Gr 3 DD // Not a candidate for ICD due to comorbid illnesses   . Coronary artery disease    cath 09/2018: RCA chronically occluded >> Med Rx  . 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 Hx of recurrent R pleural effusion    s/p thoracentesis  . Hypercholesteremia   . Lung cancer ([place]) 03/2016  . Mixed Ischemic and Non-Ischemic Cardiomyopathy   . Non-small cell carcinoma of lung   . Nonsustained ventricular tachycardia     Current Medications: No outpatient medications have been marked as taking for the 05/05/20 encounter (Appointment) with Richardson Dopp T, PA-C.     Allergies:   Patient has no known allergies.   Social History   Tobacco Use  . Smoking status: Current Some Day Smoker  . Smokeless tobacco: Never Used  Vaping Use  . Vaping Use: Never used  Substance Use Topics  . Alcohol use: Not Currently  . Drug use: Not Currently     Family Hx: The patient's family history includes Aneurysm in his mother.  ROS   EKGs/Labs/Other Test Reviewed:    EKG:  EKG is *** ordered today.  The ekg ordered today demonstrates ***  Recent Labs: 02/12/2020: ALT 20; BUN 17;  Creatinine 1.00; Hemoglobin 15.3; Platelet Count 305; Potassium 4.0; Sodium 142   Recent Lipid Panel Lab Results  Component Value Date/Time   CHOL 171 07/01/2019 12:10 PM   TRIG 52 07/01/2019 12:10 PM   HDL 68 07/01/2019 12:10 PM   CHOLHDL 2.5 07/01/2019 12:10 PM   CHOLHDL 3.2 10/22/2018 04:25 AM   LDLCALC 92 07/01/2019 12:10 PM      Risk Assessment/Calculations:   {Does this patient have ATRIAL FIBRILLATION?:[id number]}  Physical Exam:    VS:  There were no vitals taken for this visit.    Wt Readings from Last 3 Encounters:  03/18/20 137 lb 9.6 oz (62.4 kg)  03/09/20 140 lb (63.5 kg)  01/01/20 138 lb  (62.6 kg)     Physical Exam ***  ASSESSMENT & PLAN:    ***  1. Chronic systolic CHF (congestive heart failure) (HCC) EF 20-25 by echocardiogram in August 2020.  NYHA II.  Volume status appears stable.  His blood pressure limits further titration of his medications.  Continue carvedilol 3.125 mg twice daily, digoxin 0.125 mg daily, furosemide 40 mg daily, spironolactone 12.5 mg daily.  As noted previously, I reviewed his case with Dr. Rayann Heman who felt that the patient is not a candidate for ICD.  Obtain follow-up BMET today.  Follow-up with Dr. Acie Fredrickson in 6 months.  2. Coronary artery disease involving native coronary artery of native heart without angina pectoris Cath in 09/2018 with CTO of the RCA.  He is not having angina.  He is not on aspirin as he is on Warfarin.  Continue atorvastatin 40 mg daily.  3. Hyperlipidemia, unspecified hyperlipidemia type Continue high intensity statin.  Obtain follow-up lipids and LFTs today as he is fasting.  4. History of stroke Continue long-term anticoagulation with Warfarin.  His warfarin is managed by primary care.  5. Non-small cell lung cancer, right [facility]) He continues follow-up with oncology.    Shared Decision Making/Informed Consent   {Are you ordering a CV Procedure (e.g. stress test, cath, DCCV, TEE, etc)?   Press F2        :[id number]}    Dispo:  No follow-ups on file.   Medication Adjustments/Labs and Tests Ordered: Current medicines are reviewed at length with the patient today.  Concerns regarding medicines are outlined above.  Tests Ordered: No orders of the defined types were placed in this encounter.  Medication Changes: No orders of the defined types were placed in this encounter.   Signed, Richardson Dopp, PA-C  05/04/2020 9:59 PM    [facility] [place], [place], [place]  [address] Phone: [phone]; Fax: [phone]

## 2020-05-05 ENCOUNTER — Ambulatory Visit: Payer: Medicare Other | Admitting: Physician Assistant

## 2020-05-05 DIAGNOSIS — I10 Essential (primary) hypertension: Secondary | ICD-10-CM

## 2020-05-05 DIAGNOSIS — I2510 Atherosclerotic heart disease of native coronary artery without angina pectoris: Secondary | ICD-10-CM

## 2020-05-05 DIAGNOSIS — I5020 Unspecified systolic (congestive) heart failure: Secondary | ICD-10-CM

## 2020-05-05 DIAGNOSIS — E782 Mixed hyperlipidemia: Secondary | ICD-10-CM

## 2020-05-05 DIAGNOSIS — J449 Chronic obstructive pulmonary disease, unspecified: Secondary | ICD-10-CM

## 2020-05-05 DIAGNOSIS — Z8673 Personal history of transient ischemic attack (TIA), and cerebral infarction without residual deficits: Secondary | ICD-10-CM

## 2020-05-06 ENCOUNTER — Other Ambulatory Visit: Payer: Self-pay

## 2020-05-06 ENCOUNTER — Encounter: Payer: Self-pay | Admitting: Nurse Practitioner

## 2020-05-06 ENCOUNTER — Ambulatory Visit (INDEPENDENT_AMBULATORY_CARE_PROVIDER_SITE_OTHER): Payer: Medicare Other | Admitting: Nurse Practitioner

## 2020-05-06 VITALS — BP 130/80 | HR 84 | Temp 97.0°F | Ht 73.0 in | Wt 138.0 lb

## 2020-05-06 DIAGNOSIS — Z7901 Long term (current) use of anticoagulants: Secondary | ICD-10-CM | POA: Diagnosis not present

## 2020-05-06 LAB — POCT INR: INR: 3 (ref 2.0–3.0)

## 2020-05-06 MED ORDER — WARFARIN SODIUM 6 MG PO TABS: ORAL_TABLET | ORAL | 1 refills | Status: DC

## 2020-05-06 NOTE — Patient Instructions (Addendum)
INR today is 3.0 Continue current warfarin dose. F/up in 68month for nurse visit/INR check.  F/up with me in 84months (F2F, 47mins)

## 2020-05-06 NOTE — Progress Notes (Signed)
Subjective:  Patient ID: Jared Stevens, male    DOB: [birth year]  Age: 69 y.o. MRN: [id number]  CC: Follow-up (Pt would like to discuss medications, states he feels like his INRs have been off because he has not had a follow up appointment in a long time to discuss how he is taking all of his meds. Also needs refill on digoxin. Declined flu vaccine)  HPI Jared Stevens is concerned about INR level due to previous high reading 2weeks ago. States he has taken all medications as prescribed and held coumadin x 3days as previously instructed. He denies any GI/GU bleed or bruising or nosebleed or ABD pain or nausea.  Reviewed past Medical, Social and Family history today.  Outpatient Medications Prior to Visit  Medication Sig Dispense Refill  . amitriptyline (ELAVIL) 50 MG tablet Take 50 mg by mouth at bedtime as needed.     . carvedilol (COREG) 3.125 MG tablet Take 1 tablet (3.125 mg total) by mouth 2 (two) times daily. 180 tablet 1  . Cyanocobalamin (B-12) 2500 MCG SUBL Place under the tongue.    . digoxin (LANOXIN) 0.125 MG tablet Take 1 tablet (0.125 mg total) by mouth daily. Prescribed by Dustin Folks, NP on 10/20/19 90 tablet 3  . furosemide (LASIX) 40 MG tablet Take 1 tablet (40 mg total) by mouth daily. 30 tablet 2  . latanoprost (XALATAN) 0.005 % ophthalmic solution Place 1 drop into both eyes 2 (two) times daily.   11  . spironolactone (ALDACTONE) 25 MG tablet Take 0.5 tablets (12.5 mg total) by mouth every other day. Changed by Dustin Folks NP on 08/09/19    . SYMBICORT 160-4.5 MCG/ACT inhaler INL 2 PFS PO BID IN THE MORNING AND IN THE EVE    . warfarin (COUMADIN) 6 MG tablet 1.5 tabs (9mg ) every Friday, Saturday and Sunday and 1 tab (6mg ) every Monday, Tuesday, Wednesday, and Thursday. 34 tablet 1  . atorvastatin (LIPITOR) 80 MG tablet Take 1 tablet (80 mg total) by mouth daily. 90 tablet 3   No facility-administered medications prior to visit.    ROS See HPI  Objective:  BP 130/80 (BP  Location: Left Arm, Patient Position: Sitting, Cuff Size: Normal)   Pulse 84   Temp (!) 97 F (36.1 C) (Temporal)   Ht 6\' 1"  (1.854 m)   Wt 138 lb (62.6 kg)   SpO2 99%   BMI 18.21 kg/m   Physical Exam Vitals reviewed.  Neurological:     Mental Status: He is alert and oriented to person, place, and time.    Assessment & Plan:  This visit occurred during the SARS-CoV-2 public health emergency.  Safety protocols were in place, including screening questions prior to the visit, additional usage of staff PPE, and extensive cleaning of exam room while observing appropriate contact time as indicated for disinfecting solutions.   Jared Stevens was seen today for follow-up.  Diagnoses and all orders for this visit:  Long term current use of anticoagulant therapy -     POCT INR -     warfarin (COUMADIN) 6 MG tablet; 1.5 tabs (9mg ) every Friday, Saturday and Sunday and 1 tab (6mg ) every Monday, Tuesday, Wednesday, and Thursday.  INR at goal today: 3.0 Advised to maintain current dose. F/up in 51month for repeat INR check  Problem List Items Addressed This Visit      Other   Long term current use of anticoagulant therapy - Primary   Relevant Medications   warfarin (COUMADIN) 6 MG tablet  Other Relevant Orders   POCT INR (Completed)      Follow-up: Return in about 3 months (around 08/04/2020), or Nurse visit in 31month for INR, for HTN and , hyperlipidemia (F2F, 63mins).  Wilfred Lacy, NP

## 2020-05-10 ENCOUNTER — Other Ambulatory Visit: Payer: Self-pay

## 2020-05-11 ENCOUNTER — Ambulatory Visit: Payer: Medicare Other

## 2020-05-11 ENCOUNTER — Other Ambulatory Visit: Payer: Self-pay

## 2020-05-17 ENCOUNTER — Telehealth: Payer: Self-pay

## 2020-05-17 MED ORDER — DIGOXIN 125 MCG PO TABS: 0.1250 mg | ORAL_TABLET | Freq: Every day | ORAL | 3 refills | Status: DC

## 2020-05-17 NOTE — Telephone Encounter (Signed)
Patient calling to request a refill for medication. Last fill 02/06/20  #90/3 Last OV 05/06/20

## 2020-05-19 ENCOUNTER — Telehealth: Payer: Self-pay | Admitting: Internal Medicine

## 2020-05-19 NOTE — Telephone Encounter (Signed)
Scheduled follow-up appointment. Patient is aware.

## 2020-05-23 ENCOUNTER — Other Ambulatory Visit: Payer: Self-pay | Admitting: Nurse Practitioner

## 2020-05-23 DIAGNOSIS — Z7901 Long term (current) use of anticoagulants: Secondary | ICD-10-CM

## 2020-05-24 ENCOUNTER — Inpatient Hospital Stay: Payer: Medicare Other | Attending: Internal Medicine | Admitting: Internal Medicine

## 2020-06-03 ENCOUNTER — Ambulatory Visit (INDEPENDENT_AMBULATORY_CARE_PROVIDER_SITE_OTHER): Payer: Medicare Other

## 2020-06-03 ENCOUNTER — Other Ambulatory Visit: Payer: Self-pay

## 2020-06-03 DIAGNOSIS — Z7901 Long term (current) use of anticoagulants: Secondary | ICD-10-CM | POA: Diagnosis not present

## 2020-06-03 LAB — POCT INR: INR: 3.5 — AB (ref 2.0–3.0)

## 2020-06-03 NOTE — Progress Notes (Signed)
Pt here for INR check per  Goal INR = 2.0-3.0  Last INR = 3.0  Pt currently takes Coumadin   Date of last Coumadin dose = This morning 06/03/20  Pt denies recent antibiotics, no dietary changes and no unusual bruising / bleeding.  INR today = 3.5   Pt advised per Baldo Ash [name]-NP patient is to stop warfarin for one day (06/04/20) and then go back to 9mg  on Friday-Sunday and 6mg  on Monday-Thursday.

## 2020-06-04 NOTE — Progress Notes (Signed)
Medical screening examination/treatment/procedure(s) were performed by the CMA. As primary care provider I was immediately available for consulation/collaboration. I agree with above documentation. Deysha Cartier, AGNP-C 

## 2020-06-16 ENCOUNTER — Other Ambulatory Visit: Payer: Self-pay | Admitting: Nurse Practitioner

## 2020-06-16 DIAGNOSIS — Z7901 Long term (current) use of anticoagulants: Secondary | ICD-10-CM

## 2020-06-17 ENCOUNTER — Other Ambulatory Visit: Payer: Self-pay

## 2020-06-17 ENCOUNTER — Other Ambulatory Visit: Payer: Medicaid Other | Admitting: Hospice

## 2020-06-17 DIAGNOSIS — J449 Chronic obstructive pulmonary disease, unspecified: Secondary | ICD-10-CM

## 2020-06-17 DIAGNOSIS — Z515 Encounter for palliative care: Secondary | ICD-10-CM

## 2020-06-17 NOTE — Progress Notes (Signed)
[facility] Consult Note Telephone: [phone]  Fax: [phone]  PATIENT NAME: Jared Stevens DOB: September 05, 1950 MRN: [id number]  PRIMARY CARE PROVIDER:   Flossie Buffy, NP [name], [name], NP [place],  [place] [address]  REFERRING PROVIDER:Smith, Malva Limes., FNP   RESPONSIBLE PARTY:Self [phone] KENDRY, PFARR, daughter [phone]  TELEHEALTH VISIT STATEMENT Due to the COVID-19 crisis, this visit was done via telephone from my office. It was initiated and consented to by this patient and/or family.  RECOMMENDATIONS/PLAN: Visits to build trust and follow-up on palliative care. Advance Care Planning/Code status: Code status reviewed. Patient remains a Full code.   GOALS of care: Goals of care is to maximize quality of life and symptom management. Patient wishes to continue to live independently as he is presently doing.  Follow TS:[id number] care will continue to follow patient for goals of care clarification and symptom management.Next visit in two months/as needed.  Symptom management:No fall or hospitalization since last visit.  Patient reports no COPD exacerbation.  Patient denies pain/discomfort; no medical acuity.  He is compliant with his medications and breathing treatments.  CT scan  Of lungs Sept 16 2021 showed no evidence of recurrent or metastatic disease in the chest.  He reports He also sees his Oncologist every 6 months for Lung CA; had completed chemo/radiation; patient on remission. Palliative will continue to monitor for symptom management/decline and make recommendations as needed.  I spent53minutes providing this consultation;time includes chart review and documentation. More than 50% of the time in this consultation was spent on coordinating communication.  HISTORY OF PRESENT ILLNESS:Luz Atkinsis a 61 oldmalewith multiple medical problems  including CVA, CHF, HLD, COPD, lung cancer. Palliative Care was asked to help address goals of care.  CODE STATUS:Full  PPS:60% HOSPICE ELIGIBILITY/DIAGNOSIS: TBD  PAST MEDICAL HISTORY:  Past Medical History:  Diagnosis Date  . Chronic obstructive pulmonary disease   . Combined systolic and diastolic CHF    Mixed Ischemic/Non-Ischemic CM // Echo 09/2018: EF 15 // cMRI 10/2018: EF 11, inf scar // Echo 12/2018: EF 20-25, Gr 3 DD // Not a candidate for ICD due to comorbid illnesses   . Coronary artery disease    cath 09/2018: RCA chronically occluded >> Med Rx  . 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 Hx of recurrent R pleural effusion    s/p thoracentesis  . Hypercholesteremia   . Lung cancer ([place]) 03/2016  . Mixed Ischemic and Non-Ischemic Cardiomyopathy   . Non-small cell carcinoma of lung   . Nonsustained ventricular tachycardia     SOCIAL HX:  Social History   Tobacco Use  . Smoking status: Current Some Day Smoker  . Smokeless tobacco: Never Used  Substance Use Topics  . Alcohol use: Not Currently    ALLERGIES: No Known Allergies   PERTINENT MEDICATIONS:  Outpatient Encounter Medications as of 06/17/2020  Medication Sig  . amitriptyline (ELAVIL) 50 MG tablet Take 50 mg by mouth at bedtime as needed.   Marland Kitchen atorvastatin (LIPITOR) 80 MG tablet Take 1 tablet (80 mg total) by mouth daily.  . carvedilol (COREG) 3.125 MG tablet Take 1 tablet (3.125 mg total) by mouth 2 (two) times daily.  . Cyanocobalamin (B-12) 2500 MCG SUBL Place under the tongue.  . digoxin (LANOXIN) 0.125 MG tablet Take 1 tablet (0.125 mg total) by mouth daily. Prescribed by Dustin Folks, NP on  10/20/19  . furosemide (LASIX) 40 MG tablet Take 1 tablet (40 mg total) by mouth daily.  Marland Kitchen latanoprost (XALATAN) 0.005 % ophthalmic solution Place 1 drop into both eyes 2 (two) times daily.   Marland Kitchen spironolactone (ALDACTONE) 25 MG tablet Take 0.5 tablets (12.5 mg total) by  mouth every other day. Changed by Dustin Folks NP on 08/09/19  . SYMBICORT 160-4.5 MCG/ACT inhaler INL 2 PFS PO BID IN THE MORNING AND IN THE EVE  . warfarin (COUMADIN) 6 MG tablet 1.5 tabs (9mg ) every Friday, Saturday and Sunday and 1 tab (6mg ) every Monday, Tuesday, Wednesday, and Thursday.   No facility-administered encounter medications on file as of 06/17/2020.    Note:  Portions of this note were generated with [employer]. Dictation errors may occur despite attempts at proofreading.  Teodoro Spray, NP

## 2020-06-21 NOTE — Progress Notes (Signed)
[facility] [address] [place]. [place] [address] [phone]       OFFICE FOLLOW-UP NOTE  Jared Stevens Date of Birth:  Dec 17, 1950 Medical Record Number:  [id number]    Chief Complaint  Patient presents with  . Follow-up    RM 14 alone PT is well, things are about the same       HPI:   Today, 06/22/2020, Jared Stevens returns for 1 year stroke follow-up.  Stable since prior visit without new stroke/TIA symptoms. Reports residual bilateral R>L hand numbness which has been present since his stroke as well as occasional dragging of left leg.  Uses cane for ambulation and denies any recent falls.  He does experience bilateral hand pain with colder temperatures but otherwise denies pain.  Remains on warfarin with recent INR level 3.5 monitored/managed by PCP.  Denies bleeding or bruising.  He does have multiple questions in regards to diet and vitamin K with use of warfarin.  Remains on atorvastatin 80 mg daily without myalgias.  Blood pressure today 133/89.  No further concerns at this time.   History provided for reference purposes only Update 06/26/2019 Dr. Leonie Stevens: He returns for follow-up after last visit 6 months ago.  He is doing well from stroke standpoint without recurrent stroke or TIA symptoms.  He remains on warfarin and his INR has been fairly steady between 2 and 3.  He denies significant bleeding or bruising.  Continues to have some numbness and diminished fine motor skills in his hands and struggles while wearing his clothes.  Continues to walk long distances with a cane.  He feels his balance is still off.  He is an only one minor fall but no major injuries.  He had a follow-up echocardiogram done in August 2020 which showed improvement in his ejection fraction to now 20%.  He does have a follow-up appointment with his cardiologist next month.  He has no new complaints.  His blood pressure is well controlled and it is 100/63 today he is tolerating Lipitor  well without any side effects  Initial visit 12/10/2018 Dr. Leonie Stevens: Jared Stevens is a 71 year old African-American male seen today for initial office follow-up visit following hospital admission for stroke in May 2020.  History is obtained from the patient and review of electronic medical records.  I personally reviewed imaging films in PACS.JaredAyson Atkinsis a 70 y.o.malewith history of ongoing tobacco use, COPD, CHF, cardiomyopathy (EF 15%), lung cancer, hx of substance abuse, and Hldpresenting with acute onset of left sided weakness with hemineglect, left facial droop, confusion and dysphasia after falling in bathroom. CT scan of the head on admission showed no acute pathology.  Patient met criteria for IV TPA which was administered uneventfully.  CT angiogram showed acute thrombus occluding the distal right M1 segment and extending into the M2 branches.  CT perfusion showed significant penumbra.  Patient was taken for emergent mechanical thrombectomy which was performed successfully withTICI 2b reperfusion achieved by Dr. Estanislado Stevens.  Patient was admitted to the intensive care unit and had close neurological monitoring and strict blood pressure control and did well.  MRI scan subsequently showed patchy right MCA infarct involving temporal lobes, right insula, basal ganglia and frontal lobe.  2D echo showed reduced ejection fraction of 15% but no definite clot.  LDL cholesterol was 94 mg percent and hemoglobin A1c was 5.7.  Urine drug screen was positive only for benzodiazepines.  Patient had been on aspirin which was switch to warfarin for anticoagulation.  He was transferred for rehabilitation to skilled nursing facility but patient signed himself out and his living at home and getting home therapy.  He states he is doing well.  He can walk about 1 to 2 miles.  He uses a cane for outdoors but can walk indoors without it.  He states that his left leg drags a little bit but he can catch himself and avoid  falls and injuries.  He is also noticed some decreased fine motor skills in his left hand.  He is tolerating warfarin well but his INR does fluctuate.  His last INR that I can find in the system was in 10/29/2018 and was optimal at 2.2.  Denies significant bleeding though he does bruise easily.  He has not had seen his cancer doctor for follow-up and is missed a recent appointment due to the [place] situation.  He has quit smoking.  ROS:   14 system review of systems is positive for those listed in HPI and all other systems negative  PMH:  Past Medical History:  Diagnosis Date  . Chronic obstructive pulmonary disease   . Combined systolic and diastolic CHF    Mixed Ischemic/Non-Ischemic CM // Echo 09/2018: EF 15 // cMRI 10/2018: EF 11, inf scar // Echo 12/2018: EF 20-25, Gr 3 DD // Not a candidate for ICD due to comorbid illnesses   . Coronary artery disease    cath 09/2018: RCA chronically occluded >> Med Rx  . 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 Hx of recurrent R pleural effusion    s/p thoracentesis  . Hypercholesteremia   . Lung cancer ([place]) 03/2016  . Mixed Ischemic and Non-Ischemic Cardiomyopathy   . Non-small cell carcinoma of lung   . Nonsustained ventricular tachycardia     Social History:  Social History   Socioeconomic History  . Marital status: Widowed    Spouse name: Not on file  . Number of children: Not on file  . Years of education: Not on file  . Highest education level: Not on file  Occupational History  . Not on file  Tobacco Use  . Smoking status: Current Some Day Smoker  . Smokeless tobacco: Never Used  Vaping Use  . Vaping Use: Never used  Substance and Sexual Activity  . Alcohol use: Not Currently  . Drug use: Not Currently  . Sexual activity: Not on file  Other Topics Concern  . Not on file  Social History Narrative  . Not on file   Social Determinants of Health   Financial Resource Strain: Low Risk    . Difficulty of Paying Living Expenses: Not hard at all  Food Insecurity: No Food Insecurity  . Worried About Charity fundraiser in the Last Year: Never true  . Ran Out of Food in the Last Year: Never true  Transportation Needs: No Transportation Needs  . Lack of Transportation (Medical): No  . Lack of Transportation (Non-Medical): No  Physical Activity: Sufficiently Active  . Days of Exercise per Week: 7 days  . Minutes of Exercise per Session: 40 min  Stress: No Stress Concern Present  . Feeling of Stress : Not at all  Social Connections: Socially Isolated  . Frequency of Communication with Friends and Family: More than three times a week  . Frequency of Social Gatherings with Friends and Family: More than three times a week  . Attends Religious Services: Never  .  Active Member of Clubs or Organizations: No  . Attends Archivist Meetings: Never  . Marital Status: Widowed  Intimate Partner Violence: Not At Risk  . Fear of Current or Ex-Partner: No  . Emotionally Abused: No  . Physically Abused: No  . Sexually Abused: No    Medications:   Current Outpatient Medications on File Prior to Visit  Medication Sig Dispense Refill  . amitriptyline (ELAVIL) 50 MG tablet Take 50 mg by mouth at bedtime as needed.     Marland Kitchen atorvastatin (LIPITOR) 80 MG tablet Take 1 tablet (80 mg total) by mouth daily. 90 tablet 3  . carvedilol (COREG) 3.125 MG tablet Take 1 tablet (3.125 mg total) by mouth 2 (two) times daily. 180 tablet 1  . Cyanocobalamin (B-12) 2500 MCG SUBL Place under the tongue.    . digoxin (LANOXIN) 0.125 MG tablet Take 1 tablet (0.125 mg total) by mouth daily. Prescribed by Dustin Folks, NP on 10/20/19 90 tablet 3  . furosemide (LASIX) 40 MG tablet Take 1 tablet (40 mg total) by mouth daily. 30 tablet 2  . latanoprost (XALATAN) 0.005 % ophthalmic solution Place 1 drop into both eyes 2 (two) times daily.   11  . spironolactone (ALDACTONE) 25 MG tablet Take 0.5 tablets (12.5 mg  total) by mouth every other day. Changed by Dustin Folks NP on 08/09/19    . SYMBICORT 160-4.5 MCG/ACT inhaler INL 2 PFS PO BID IN THE MORNING AND IN THE EVE    . warfarin (COUMADIN) 6 MG tablet 1.5 tabs (67m) every Friday, Saturday and Sunday and 1 tab (617m every Monday, Tuesday, Wednesday, and Thursday. 34 tablet 1   No current facility-administered medications on file prior to visit.    Allergies:  No Known Allergies  Physical Exam Today's Vitals   06/22/20 1047  BP: 133/89  Pulse: 80  Weight: 139 lb 9.6 oz (63.3 kg)  Height: 6' 7"  (2.007 m)   Body mass index is 15.73 kg/m.   General: Frail very pleasant middle-aged African-American male, seated, in no evident distress Head: head normocephalic and atraumatic.  Neck: supple with no carotid or supraclavicular bruits Cardiovascular: regular rate and rhythm, no murmurs Musculoskeletal: no deformity Skin:  no rash/petichiae Vascular:  Normal pulses all extremities  Neurologic Exam Mental Status: Awake and fully alert. Oriented to place and time. Recent and remote memory intact. Attention span, concentration and fund of knowledge appropriate. Mood and affect appropriate.  Cranial Nerves: Pupils equal, briskly reactive to light. Extraocular movements full without nystagmus. Visual fields full to confrontation. Hearing intact. Facial sensation intact. Face, tongue, palate moves normally and symmetrically.  Motor: Normal bulk and tone. Normal strength in all tested extremity muscles except slightly decreased left hand dexterity and mild ankle dorsiflexion weakness.  Sensory.: intact to touch ,pinprick .position and vibratory sensation.  Coordination: Rapid alternating movements normal in all extremities except slightly decreased left hand. Finger-to-nose and heel-to-shin performed accurately bilaterally. Gait and Station: Arises from chair without difficulty. Stance is normal. Gait demonstrates normal stride length but decreased left leg  step height with normal balance and use of cane.  Unable to tandem walk or heel toe Reflexes: 1+ and symmetric. Toes downgoing.      ASSESSMENT/PLAN: [age]ear old African-American male with embolic right MCA stroke in May 2020 treated with IV TPA followed by successful mechanical thrombectomy from cardiogenic embolism from ischemic cardiomyopathy.  Vascular risk factors of hypercoagulability from adenocarcinoma of the lung, cardiomyopathy with low ejection fraction, hyper lipidemia., and chronic  smoking     1. R MCA stroke :  a. Residual deficits of left hand numbness and mild left hand and ankle dorsiflexion weakness.  Overall stable without worsening.   b. Continue warfarin daily  and atorvastatin 80 mg daily for secondary stroke prevention.  c. Discussed secondary stroke prevention measures and importance of close PCP f/u for aggressive stroke risk factor management  2. Ischemic cardiomyopathy: On warfarin with INR goal 2-3 monitored/managed by PCP.  Provided patient with vitamin K and warfarin education but advised to discuss further with PCP prior to making any dietary changes 3. HTN: BP goal <130/90. Stable on carvedilol, furosemide and spironolactone per PCP 4. HLD: LDL goal <70. On atorvastatin 80 mg daily per PCP    Follow-up in 1 year or call earlier if needed   CC:  [facility] provider: Dr. Trudi Ida, [name], NP   I spent 30 minutes of face-to-face and non-face-to-face time with patient.  This included previsit chart review, lab review, study review, order entry, electronic health record documentation, patient education and discussion regarding history of prior stroke, residual deficits, ongoing use of warfarin and diet information, importance of managing stroke risk factors and answered all other questions to patient satisfaction  Frann Rider, AGNP-BC  [facility] [address] [place], [place] [address]  Phone [phone] Fax  [phone] Note: This document was prepared with digital dictation and possible smart phrase technology. Any transcriptional errors that result from this process are unintentional.

## 2020-06-22 ENCOUNTER — Encounter: Payer: Self-pay | Admitting: Adult Health

## 2020-06-22 ENCOUNTER — Ambulatory Visit (INDEPENDENT_AMBULATORY_CARE_PROVIDER_SITE_OTHER): Payer: 59 | Admitting: Adult Health

## 2020-06-22 VITALS — BP 133/89 | HR 80 | Ht 79.0 in | Wt 139.6 lb

## 2020-06-22 DIAGNOSIS — I255 Ischemic cardiomyopathy: Secondary | ICD-10-CM | POA: Diagnosis not present

## 2020-06-22 DIAGNOSIS — I10 Essential (primary) hypertension: Secondary | ICD-10-CM | POA: Diagnosis not present

## 2020-06-22 DIAGNOSIS — I63511 Cerebral infarction due to unspecified occlusion or stenosis of right middle cerebral artery: Secondary | ICD-10-CM

## 2020-06-22 DIAGNOSIS — E785 Hyperlipidemia, unspecified: Secondary | ICD-10-CM

## 2020-06-22 NOTE — Progress Notes (Signed)
I agree with the above plan 

## 2020-06-22 NOTE — Patient Instructions (Signed)
Continue warfarin daily  and atorvastatin  for secondary stroke prevention  Continue to follow up with PCP regarding cholesterol and blood pressure management  Maintain strict control of hypertension with blood pressure goal below 130/90 and cholesterol with LDL cholesterol (bad cholesterol) goal below 70 mg/dL.     Followup in the future with me in 1 year or call earlier if needed      Thank you for coming to see [place] at [facility]. I hope we have been able to provide you high quality care today.  You may receive a patient satisfaction survey over the next few weeks. We would appreciate your feedback and comments so that we may continue to improve ourselves and the health of our patients.     Vitamin K Foods and Warfarin Warfarin is a blood thinner (anticoagulant). Anticoagulant medicines help prevent the formation of blood clots. Warfarin works by blocking the activity of vitamin K, which promotes normal blood clotting. When you take warfarin, problems can occur from suddenly increasing or decreasing the amount of vitamin K that you eat from one day to the next. These problems can occur due to varying levels of warfarin in your blood. Problems may include:  Blood clots.  Bleeding. What are tips for eating the right amount of vitamin K? To avoid problems when taking warfarin:  Eat a balanced diet that includes: ? Fresh fruits and vegetables. ? Whole grains. ? Low-fat dairy products. ? Lean proteins, such as fish, eggs, and lean cuts of meat.  Keep your intake of vitamin K consistent from day to day. To do this: ? Avoid eating large amounts of vitamin K one day and low amounts of vitamin K the next day. ? If you take a multivitamin that contains vitamin K, be sure to take it every day. ? Know which foods contain vitamin K. Read food labels. Use the lists below to understand serving sizes and the amount of vitamin K in one serving.  Avoid major changes in  your diet. If you are going to change your diet, talk with your health care provider before making changes.  Work with a Financial planner (dietitian) to develop a meal plan that works best for you.   What foods are high in vitamin K? Foods that are high in vitamin K contain more than 100 mcg (micrograms) per serving. These include:  Broccoli (cooked from fresh) -  cup (78 g) has 110 mcg.  Brussels sprouts (cooked from fresh) -  cup (78 g) has 109 mcg.  Greens, beet (cooked from fresh) -  cup (72 g) has 350 mcg.  Greens, collard (cooked from fresh) -  cup (66 g) has 263 mcg.  Greens, turnip (cooked from fresh) -  cup (72 g) has 265 mcg.  Green onions or scallions -  cup (50 g) has 105 mcg.  Kale (cooked from fresh) -  cup (68 g) has 536 mcg.  Parsley (raw) - 10 sprigs (10 g) has 164 mcg.  Spinach (cooked from fresh) -  cup (90 g) has 444 mcg.  Swiss chard (cooked from fresh) -  cup (88 g) has 287 mcg.   What foods have a moderate amount of vitamin K? Foods that have a moderate amount of vitamin K contain 25-100 mcg per serving. These include:  Asparagus (cooked from fresh) - 4 spears (60 g) have 30 mcg.  Black-eyed peas (dried) -  cup (85 g) has 32 mcg.  Cabbage (cooked from fresh) -  cup (  78 g) has 84 mcg.  Cabbage (raw) -  cup (35 g) has 26 mcg.  Kiwi fruit - 1 medium (69 g) has 27 mcg.  Lettuce (raw) - 1 cup (36 g) has 45 mcg.  Okra (cooked from fresh) -  cup (80 g) has 32 mcg.  Prunes (dried) - 5 prunes (47 g) have 25 mcg.  Watercress (raw) - 1 cup (34 g) has 85 mcg. What foods are low in vitamin K? Foods low in vitamin K contain less than 25 mcg per serving. These include:  Artichoke - 1 medium (128 g) has 18 mcg.  Avocado - 1 oz (21 g) has 6 mcg.  Blueberries -  cup (73 g) has 14 mcg.  Carrots (cooked from fresh) -  cup (78 g) has 11 mcg.  Cauliflower (raw) -  cup (54 g) has 8 mcg.  Cucumber with peel (raw) -  cup (52 g) has 9  mcg.  Grapes -  cup (76 g) has 12 mcg.  Mango - 1 medium (207 g) has 9 mcg.  Mixed nuts - 1 cup (142 g) has 17 mcg.  Pear - 1 medium (178 g) has 8 mcg.  Peas (cooked from fresh) -  cup (80 g) has 20 mcg.  Pickled cucumber - 1 spear (65 g) has 11 mcg.  Sauerkraut (canned) -  cup (118 g) has 16 mcg.  Soybeans (cooked from fresh) -  cup (86 g) has 16 mcg.  Tomato (raw) - 1 medium (123 g) has 10 mcg.  Tomato sauce (raw) -  cup (123 g) has 17 mcg.   What foods do not have vitamin K? If a food contains less than 5 mcg per serving, it is considered to have no vitamin K. These foods include:  Bread and cereal products.  Cheese.  Eggs.  Fish and shellfish.  Meat and poultry.  Milk and dairy products.  Seeds, such as sunflower or pumpkin seeds. The items listed above may not be a complete list of foods that have high, moderate, and low amounts of vitamin K or do not have vitamin K. Actual amounts of vitamin K may differ depending on processing. Contact a dietitian for more information. Summary  Warfarin is an anticoagulant that prevents blood clots by blocking the activity of vitamin K. It is important to monitor the content of vitamin K in your foods and to be consistent with the amount of vitamin K you take in each day.  Avoid major changes in your diet. If you are going to change your diet, talk with your health care provider before making changes. This information is not intended to replace advice given to you by your health care provider. Make sure you discuss any questions you have with your health care provider. Document Revised: 03/04/2019 Document Reviewed: 03/04/2019 Elsevier Patient Education  [place].

## 2020-06-24 NOTE — Telephone Encounter (Signed)
Last OV 05/06/20 Last fill 05/06/20  #34/1

## 2020-06-30 ENCOUNTER — Other Ambulatory Visit: Payer: Self-pay

## 2020-07-01 ENCOUNTER — Ambulatory Visit (INDEPENDENT_AMBULATORY_CARE_PROVIDER_SITE_OTHER): Payer: Medicare Other

## 2020-07-01 DIAGNOSIS — Z7901 Long term (current) use of anticoagulants: Secondary | ICD-10-CM | POA: Diagnosis not present

## 2020-07-01 LAB — POCT INR: INR: 3.2 — AB (ref 2.0–3.0)

## 2020-07-01 NOTE — Progress Notes (Signed)
Pt here for INR check per Baldo Ash [name]-NP  Goal INR = 2.0-3.0  Last INR = 3.5  Pt currently takes Coumadin   Date of last Coumadin dose = This morning 07/01/20  Pt denies recent antibiotics, no dietary changes and no unusual bruising / bleeding.  INR today = 3.2  Pt advised per NP [name] to keep current medication regimen, 6mg  Mon-Thurs 9mg  Fri-Sat. Follow up on 08/06/2019

## 2020-07-05 ENCOUNTER — Other Ambulatory Visit: Payer: Self-pay | Admitting: Medical Oncology

## 2020-07-05 ENCOUNTER — Telehealth: Payer: Self-pay | Admitting: Internal Medicine

## 2020-07-05 ENCOUNTER — Telehealth: Payer: Self-pay | Admitting: Medical Oncology

## 2020-07-05 NOTE — Telephone Encounter (Signed)
Called pt per 2/7 sch msg - no answer - no vmail.

## 2020-07-05 NOTE — Telephone Encounter (Signed)
Pt tried to schedule ride tomorrow and unsuccessful - Transportation arranged. Ben from transportation is calling pt to explain how the process works and that it is free.

## 2020-07-06 ENCOUNTER — Other Ambulatory Visit: Payer: Self-pay

## 2020-07-06 ENCOUNTER — Telehealth: Payer: Self-pay | Admitting: Internal Medicine

## 2020-07-06 ENCOUNTER — Inpatient Hospital Stay: Payer: Medicare Other | Attending: Internal Medicine | Admitting: Internal Medicine

## 2020-07-06 VITALS — BP 126/78 | HR 81 | Temp 97.9°F | Resp 15 | Ht 79.0 in | Wt 140.4 lb

## 2020-07-06 DIAGNOSIS — C3491 Malignant neoplasm of unspecified part of right bronchus or lung: Secondary | ICD-10-CM

## 2020-07-06 DIAGNOSIS — I2510 Atherosclerotic heart disease of native coronary artery without angina pectoris: Secondary | ICD-10-CM | POA: Insufficient documentation

## 2020-07-06 DIAGNOSIS — J449 Chronic obstructive pulmonary disease, unspecified: Secondary | ICD-10-CM | POA: Diagnosis not present

## 2020-07-06 DIAGNOSIS — I5042 Chronic combined systolic (congestive) and diastolic (congestive) heart failure: Secondary | ICD-10-CM | POA: Diagnosis not present

## 2020-07-06 DIAGNOSIS — Z923 Personal history of irradiation: Secondary | ICD-10-CM | POA: Insufficient documentation

## 2020-07-06 DIAGNOSIS — Z9221 Personal history of antineoplastic chemotherapy: Secondary | ICD-10-CM | POA: Insufficient documentation

## 2020-07-06 DIAGNOSIS — C3490 Malignant neoplasm of unspecified part of unspecified bronchus or lung: Secondary | ICD-10-CM | POA: Insufficient documentation

## 2020-07-06 DIAGNOSIS — E7800 Pure hypercholesterolemia, unspecified: Secondary | ICD-10-CM | POA: Insufficient documentation

## 2020-07-06 DIAGNOSIS — Z7901 Long term (current) use of anticoagulants: Secondary | ICD-10-CM | POA: Diagnosis not present

## 2020-07-06 DIAGNOSIS — I255 Ischemic cardiomyopathy: Secondary | ICD-10-CM | POA: Insufficient documentation

## 2020-07-06 DIAGNOSIS — Z79899 Other long term (current) drug therapy: Secondary | ICD-10-CM | POA: Diagnosis not present

## 2020-07-06 NOTE — Progress Notes (Signed)
[facility] [phone]   [phone]  OFFICE PROGRESS NOTE  [name], [name], NP [place] [address]  DIAGNOSIS: Stage IIIA non-small cell lung cancer, adenocarcinoma   PRIOR THERAPY: status post a course of concurrent chemoradiation with weekly carboplatin and paclitaxel completed in early 2018.  The patient has been in observation since that time.  CURRENT THERAPY: Observation.  INTERVAL HISTORY: Jared Stevens 70 y.o. male returns to the clinic today for follow-up visit.  The patient is feeling fine today with no concerning complaints except for mild shortness of breath with exertion.  He denied having any current chest pain, cough or hemoptysis.  He denied having any fever or chills.  He has no nausea, vomiting, diarrhea or constipation.  He had repeat CT scan of the chest in September 2021.  He was supposed to come for follow-up visit after his scan but unfortunately he missed his appointment.  He is here today for reevaluation and recommendation regarding his condition.    MEDICAL HISTORY: Past Medical History:  Diagnosis Date  . Chronic obstructive pulmonary disease   . Combined systolic and diastolic CHF    Mixed Ischemic/Non-Ischemic CM // Echo 09/2018: EF 15 // cMRI 10/2018: EF 11, inf scar // Echo 12/2018: EF 20-25, Gr 3 DD // Not a candidate for ICD due to comorbid illnesses   . Coronary artery disease    cath 09/2018: RCA chronically occluded >> Med Rx  . 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 Hx of recurrent R pleural effusion    s/p thoracentesis  . Hypercholesteremia   . Lung cancer ([facility]) 03/2016  . Mixed Ischemic and Non-Ischemic Cardiomyopathy   . Non-small cell carcinoma of lung   . Nonsustained ventricular tachycardia     ALLERGIES:  has No Known Allergies.  MEDICATIONS:  Current Outpatient Medications  Medication Sig Dispense Refill  .  amitriptyline (ELAVIL) 50 MG tablet Take 50 mg by mouth at bedtime as needed.     Marland Kitchen atorvastatin (LIPITOR) 80 MG tablet Take 1 tablet (80 mg total) by mouth daily. 90 tablet 3  . carvedilol (COREG) 3.125 MG tablet Take 1 tablet (3.125 mg total) by mouth 2 (two) times daily. 180 tablet 1  . Cyanocobalamin (B-12) 2500 MCG SUBL Place under the tongue.    . digoxin (LANOXIN) 0.125 MG tablet Take 1 tablet (0.125 mg total) by mouth daily. Prescribed by Dustin Folks, NP on 10/20/19 90 tablet 3  . furosemide (LASIX) 40 MG tablet Take 1 tablet (40 mg total) by mouth daily. 30 tablet 2  . latanoprost (XALATAN) 0.005 % ophthalmic solution Place 1 drop into both eyes 2 (two) times daily.   11  . spironolactone (ALDACTONE) 25 MG tablet Take 0.5 tablets (12.5 mg total) by mouth every other day. Changed by Dustin Folks NP on 08/09/19    . SYMBICORT 160-4.5 MCG/ACT inhaler INL 2 PFS PO BID IN THE MORNING AND IN THE EVE    . warfarin (COUMADIN) 6 MG tablet TAKE 1 & 1/2 [place] 1 TAB EVERY MONDAY,TUESDAY, WEDNESDAY,& THURSDAY. 34 tablet 1   No current facility-administered medications for this visit.    SURGICAL HISTORY:  Past Surgical History:  Procedure Laterality Date  . Arm Surgery    . HERNIA REPAIR    . IR ANGIO INTRA EXTRACRAN SEL COM CAROTID INNOMINATE UNI L  MOD SED  10/21/2018  . IR ANGIO VERTEBRAL SEL SUBCLAVIAN INNOMINATE UNI R MOD SED  10/21/2018  . IR CT HEAD LTD  10/21/2018  . IR PERCUTANEOUS ART THROMBECTOMY/INFUSION INTRACRANIAL INC DIAG ANGIO  10/21/2018  . IR THORACENTESIS ASP PLEURAL SPACE W/IMG GUIDE  09/27/2018  . RADIOLOGY WITH ANESTHESIA N/A 10/21/2018   Procedure: RADIOLOGY WITH ANESTHESIA;  Surgeon: Luanne Bras, MD;  Location: [facility];  Service: Radiology;  Laterality: N/A;  . REPLANTATION THUMB    . RIGHT/LEFT HEART CATH AND CORONARY ANGIOGRAPHY N/A 10/01/2018   Procedure: RIGHT/LEFT HEART CATH AND CORONARY ANGIOGRAPHY;  Surgeon: Troy Sine, MD;  Location:  [facility];  Service: Cardiovascular;  Laterality: N/A;    REVIEW OF SYSTEMS:  A comprehensive review of systems was negative except for: Respiratory: positive for dyspnea on exertion   PHYSICAL EXAMINATION: General appearance: alert, cooperative, fatigued and no distress Head: Normocephalic, without obvious abnormality, atraumatic Neck: no adenopathy, no JVD, supple, symmetrical, trachea midline and thyroid not enlarged, symmetric, no tenderness/mass/nodules Lymph nodes: Cervical, supraclavicular, and axillary nodes normal. Resp: clear to auscultation bilaterally Back: symmetric, no curvature. ROM normal. No CVA tenderness. Cardio: regular rate and rhythm, S1, S2 normal, no murmur, click, rub or gallop GI: soft, non-tender; bowel sounds normal; no masses,  no organomegaly Extremities: extremities normal, atraumatic, no cyanosis or edema  ECOG PERFORMANCE STATUS: 1 - Symptomatic but completely ambulatory  Blood pressure 126/78, pulse 81, temperature 97.9 F (36.6 C), temperature source Tympanic, resp. rate 15, height 6\' 7"  (2.007 m), weight 140 lb 6.4 oz (63.7 kg), SpO2 98 %.  LABORATORY DATA: Lab Results  Component Value Date   WBC 7.1 02/12/2020   HGB 15.3 02/12/2020   HCT 46.4 02/12/2020   MCV 87.5 02/12/2020   PLT 305 02/12/2020      Chemistry      Component Value Date/Time   NA 142 02/12/2020 1127   NA 145 (H) 07/01/2019 1210   K 4.0 02/12/2020 1127   CL 105 02/12/2020 1127   CO2 29 02/12/2020 1127   BUN 17 02/12/2020 1127   BUN 19 07/01/2019 1210   CREATININE 1.00 02/12/2020 1127      Component Value Date/Time   CALCIUM 9.7 02/12/2020 1127   ALKPHOS 91 02/12/2020 1127   AST 23 02/12/2020 1127   ALT 20 02/12/2020 1127   BILITOT 0.7 02/12/2020 1127       RADIOGRAPHIC STUDIES  ASSESSMENT AND PLAN: This is a very pleasant 70 years old African-American male with history of a stage IIIa non-small cell lung cancer, adenocarcinoma status post a course of  concurrent chemoradiation in [place] in 2017. The patient is currently on observation and he is feeling fine with no concerning complaints except for the shortness of breath with exertion. His last CT scan of the chest in September 2021 showed no concerning findings for disease progression. I recommended for the patient to continue on observation and I will repeat CT scan of the chest in 3 months for restaging of his disease. He was strongly advised to keep his appointment as planned. He was also advised to call immediately if he has any concerning symptoms in the interval. The patient voices understanding of current disease status and treatment options and is in agreement with the current care plan. All questions were answered. The patient knows to call the clinic with any problems, questions or concerns. We can certainly see the patient much sooner if necessary.  Disclaimer: This note was dictated  with voice recognition software. Similar sounding words can inadvertently be transcribed and may not be corrected upon review.

## 2020-07-06 NOTE — Telephone Encounter (Signed)
Scheduled appointments per 2/8 los. Spoke to patient who is aware of appointments dates and times. Gave patient calendar print out.

## 2020-07-28 ENCOUNTER — Encounter (HOSPITAL_COMMUNITY): Payer: Self-pay

## 2020-07-28 ENCOUNTER — Other Ambulatory Visit: Payer: Self-pay

## 2020-07-28 ENCOUNTER — Emergency Department (HOSPITAL_COMMUNITY)
Admission: EM | Admit: 2020-07-28 | Discharge: 2020-07-28 | Disposition: A | Discharge status: Home / Self Care | Payer: Medicare Other | Attending: Emergency Medicine | Admitting: Emergency Medicine

## 2020-07-28 ENCOUNTER — Encounter: Payer: Self-pay | Admitting: Nurse Practitioner

## 2020-07-28 DIAGNOSIS — N390 Urinary tract infection, site not specified: Secondary | ICD-10-CM | POA: Insufficient documentation

## 2020-07-28 DIAGNOSIS — I5040 Unspecified combined systolic (congestive) and diastolic (congestive) heart failure: Secondary | ICD-10-CM | POA: Insufficient documentation

## 2020-07-28 DIAGNOSIS — F17200 Nicotine dependence, unspecified, uncomplicated: Secondary | ICD-10-CM | POA: Diagnosis not present

## 2020-07-28 DIAGNOSIS — R338 Other retention of urine: Secondary | ICD-10-CM | POA: Diagnosis not present

## 2020-07-28 DIAGNOSIS — Z85118 Personal history of other malignant neoplasm of bronchus and lung: Secondary | ICD-10-CM | POA: Diagnosis not present

## 2020-07-28 DIAGNOSIS — I2510 Atherosclerotic heart disease of native coronary artery without angina pectoris: Secondary | ICD-10-CM | POA: Diagnosis not present

## 2020-07-28 DIAGNOSIS — R0902 Hypoxemia: Secondary | ICD-10-CM | POA: Diagnosis not present

## 2020-07-28 DIAGNOSIS — Z7901 Long term (current) use of anticoagulants: Secondary | ICD-10-CM | POA: Diagnosis not present

## 2020-07-28 DIAGNOSIS — R300 Dysuria: Secondary | ICD-10-CM | POA: Diagnosis present

## 2020-07-28 LAB — URINALYSIS, ROUTINE W REFLEX MICROSCOPIC
Bilirubin Urine: NEGATIVE
Glucose, UA: NEGATIVE mg/dL
Ketones, ur: NEGATIVE mg/dL
Nitrite: NEGATIVE
Protein, ur: NEGATIVE mg/dL
Specific Gravity, Urine: 1.004 — ABNORMAL LOW (ref 1.005–1.030)
WBC, UA: >50 WBC/hpf — ABNORMAL HIGH (ref 0–5)
pH: 5 (ref 5.0–8.0)

## 2020-07-28 MED ORDER — CEPHALEXIN 500 MG PO CAPS: 500.0000 mg | ORAL_CAPSULE | Freq: Four times a day (QID) | ORAL | 0 refills | Status: DC

## 2020-07-28 MED ORDER — CEPHALEXIN 500 MG PO CAPS
1000.0000 mg | ORAL_CAPSULE | Freq: Once | ORAL | Status: AC
  Administered 2020-07-28: 1000 mg via ORAL
  Filled 2020-07-28: qty 2

## 2020-07-28 NOTE — ED Provider Notes (Signed)
[facility] DEPT Provider Note: Georgena Spurling, MD, FACEP  CSN: [id number] MRN: [id number] ARRIVAL: 07/28/20 at [place]: [place]  Urinary Retention   HISTORY OF PRESENT ILLNESS  07/28/20 5:44 AM Jared Stevens is a 70 y.o. male with noncell carcinoma of the lung. He has had a burning sensation with urination and difficulty voiding for about the past 2 months. He states he had been able to void until about an hour prior to arrival after drinking a beer. He is having pain in his suprapubic region which she rates as a 6 out of 10, this is worse with movement or palpation. Bedside ultrasound performed by nursing staff showed about 930 mL of retained urine.  Past Medical History:  Diagnosis Date  . Chronic obstructive pulmonary disease   . Combined systolic and diastolic CHF    Mixed Ischemic/Non-Ischemic CM // Echo 09/2018: EF 15 // cMRI 10/2018: EF 11, inf scar // Echo 12/2018: EF 20-25, Gr 3 DD // Not a candidate for ICD due to comorbid illnesses   . Coronary artery disease    cath 09/2018: RCA chronically occluded >> Med Rx  . 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 Hx of recurrent R pleural effusion    s/p thoracentesis  . Hypercholesteremia   . Lung cancer ([facility]) 03/2016  . Mixed Ischemic and Non-Ischemic Cardiomyopathy   . Non-small cell carcinoma of lung   . Nonsustained ventricular tachycardia     Past Surgical History:  Procedure Laterality Date  . Arm Surgery    . HERNIA REPAIR    . IR ANGIO INTRA EXTRACRAN SEL COM CAROTID INNOMINATE UNI L MOD SED  10/21/2018  . IR ANGIO VERTEBRAL SEL SUBCLAVIAN INNOMINATE UNI R MOD SED  10/21/2018  . IR CT HEAD LTD  10/21/2018  . IR PERCUTANEOUS ART THROMBECTOMY/INFUSION INTRACRANIAL INC DIAG ANGIO  10/21/2018  . IR THORACENTESIS ASP PLEURAL SPACE W/IMG GUIDE  09/27/2018  . RADIOLOGY WITH ANESTHESIA N/A 10/21/2018   Procedure: RADIOLOGY WITH ANESTHESIA;  Surgeon: Luanne Bras, MD;  Location: [facility];  Service: Radiology;  Laterality: N/A;  . REPLANTATION THUMB    . RIGHT/LEFT HEART CATH AND CORONARY ANGIOGRAPHY N/A 10/01/2018   Procedure: RIGHT/LEFT HEART CATH AND CORONARY ANGIOGRAPHY;  Surgeon: Troy Sine, MD;  Location: [facility];  Service: Cardiovascular;  Laterality: N/A;    Family History  Problem Relation Age of Onset  . Aneurysm Mother     Social History   Tobacco Use  . Smoking status: Current Some Day Smoker  . Smokeless tobacco: Never Used  Vaping Use  . Vaping Use: Never used  Substance Use Topics  . Alcohol use: Yes    Alcohol/week: 2.0 standard drinks    Types: 2 Cans of beer per week    Comment: 1-2 beers weekly  . Drug use: Not Currently    Prior to Admission medications   Medication Sig Start Date End Date Taking? Authorizing Provider  cephALEXin (KEFLEX) 500 MG capsule Take 1 capsule (500 mg total) by mouth 4 (four) times daily. 07/28/20  Yes Kelcey Korus, MD  amitriptyline (ELAVIL) 50 MG tablet Take 50 mg by mouth at bedtime as needed.  09/17/18   [provider]  atorvastatin (LIPITOR) 80 MG tablet Take 1 tablet (80 mg total) by mouth daily. 09/05/19 12/04/19  Nahser, Wonda Cheng, MD  carvedilol (COREG) 3.125 MG tablet Take 1 tablet (  3.125 mg total) by mouth 2 (two) times daily. 04/26/20   Nahser, Wonda Cheng, MD  Cyanocobalamin (B-12) 2500 MCG SUBL Place under the tongue.    [provider]  digoxin (LANOXIN) 0.125 MG tablet Take 1 tablet (0.125 mg total) by mouth daily. Prescribed by Dustin Folks, NP on 10/20/19 05/17/20   [name], [name], NP  furosemide (LASIX) 40 MG tablet Take 1 tablet (40 mg total) by mouth daily. 10/30/18   Rosalin Hawking, MD  latanoprost (XALATAN) 0.005 % ophthalmic solution Place 1 drop into both eyes 2 (two) times daily.  03/12/18   [provider]  spironolactone (ALDACTONE) 25 MG tablet Take 0.5 tablets (12.5 mg total) by mouth every other day. Changed by Dustin Folks NP on  08/09/19 01/24/20   Flossie Buffy, NP  SYMBICORT 160-4.5 MCG/ACT inhaler INL 2 PFS PO BID IN THE MORNING AND IN THE EVE 10/28/18   [provider]  warfarin (COUMADIN) 6 MG tablet TAKE 1 & 1/2 [place] 1 TAB EVERY MONDAY,TUESDAY, WEDNESDAY,& THURSDAY. 06/24/20   [name], [name], NP    Allergies Patient has no known allergies.   REVIEW OF SYSTEMS  Negative except as noted here or in the History of Present Illness.   PHYSICAL EXAMINATION  Initial Vital Signs Blood pressure (!) 146/92, pulse 94, temperature 97.6 F (36.4 C), temperature source Oral, SpO2 94 %.  Examination General: Well-developed, cachectic male in no acute distress; appearance consistent with age of record HENT: normocephalic; atraumatic Eyes: pupils equal, round and reactive to light; extraocular muscles intact Neck: supple Heart: regular rate and rhythm Lungs: clear to auscultation bilaterally Abdomen: soft; distended, tender bladder; bowel sounds present GU: Tanner V male, uncircumcised; soft, reducible, nontender left inguinal hernia Extremities: No deformity; full range of motion; pulses normal Neurologic: Awake, alert and oriented; motor function intact in all extremities and symmetric; no facial droop Skin: Warm and dry Psychiatric: Normal mood and affect   RESULTS  Summary of this visit's results, reviewed and interpreted by myself:   EKG Interpretation  Date/Time:    Ventricular Rate:    PR Interval:    QRS Duration:   QT Interval:    QTC Calculation:   R Axis:     Text Interpretation:        Laboratory Studies: Results for orders placed or performed during the hospital encounter of 07/28/20 (from the past 24 hour(s))  Urinalysis, Routine w reflex microscopic Urine, Clean Catch     Status: Abnormal   Collection Time: 07/28/20  5:31 AM  Result Value Ref Range   Color, Urine YELLOW YELLOW   APPearance HAZY (A) CLEAR   Specific Gravity, Urine 1.004  (L) 1.005 - 1.030   pH 5.0 5.0 - 8.0   Glucose, UA NEGATIVE NEGATIVE mg/dL   Hgb urine dipstick LARGE (A) NEGATIVE   Bilirubin Urine NEGATIVE NEGATIVE   Ketones, ur NEGATIVE NEGATIVE mg/dL   Protein, ur NEGATIVE NEGATIVE mg/dL   Nitrite NEGATIVE NEGATIVE   Leukocytes,Ua LARGE (A) NEGATIVE   RBC / HPF 21-50 0 - 5 RBC/hpf   WBC, UA >50 (H) 0 - 5 WBC/hpf   Bacteria, UA FEW (A) NONE SEEN   WBC Clumps PRESENT    Imaging Studies: No results found.  ED COURSE and MDM  Nursing notes, initial and subsequent vitals signs, including pulse oximetry, reviewed and interpreted by myself.  Vitals:   07/28/20 0531  BP: (!) 146/92  Pulse: 94  Temp: 97.6 F (  36.4 C)  TempSrc: Oral  SpO2: 94%   Medications  cephALEXin (KEFLEX) capsule 1,000 mg (has no administration in time range)    6:06 AM Foley catheter placed by nursing staff.  Approximately 1300 mL of cloudy urine obtained so far.  Urinalysis consistent with urinary tract infection and urine sent for culture.  We will send patient home with a leg bag and refer to urology.  Will start patient on Keflex 500 mg 4 times daily.  PROCEDURES  Procedures   ED DIAGNOSES     ICD-10-CM   1. Acute urinary retention  R33.8   2. Lower urinary tract infectious disease  N39.0        Carmine Youngberg, MD 07/28/20 [phone]

## 2020-07-28 NOTE — ED Notes (Signed)
Pt is attempting to call family for transport

## 2020-07-28 NOTE — ED Triage Notes (Signed)
Pt c/o burning sensation with urinating and difficulty urinating over past 2 months.  He reports drinking a beer about 30 minutes ago and not being able to urinate afterward.

## 2020-07-28 NOTE — ED Notes (Signed)
Leg bag applied to pt. Pt verbalized understanding of d/c, medication, and follow up care.

## 2020-07-29 ENCOUNTER — Telehealth: Payer: Self-pay | Admitting: Nurse Practitioner

## 2020-07-29 DIAGNOSIS — R338 Other retention of urine: Secondary | ICD-10-CM

## 2020-07-29 NOTE — Telephone Encounter (Signed)
Patient was seen at the [facility] and was unable to urinate. They recommended that he call his PCP and get a referral to Urology. They said his bladder is retaining fluid. Please call him at [phone] if you have any questions.

## 2020-07-29 NOTE — Telephone Encounter (Signed)
Pt states he does have a leg bag. Pt states they placed the catheter in because he was unable to urinate and now he is just waiting to hear from urology to schedule an appointment.

## 2020-07-29 NOTE — Addendum Note (Signed)
Addended by: Renette Butters on: 07/29/2020 04:22 PM   Modules accepted: Orders

## 2020-07-29 NOTE — Telephone Encounter (Signed)
Enter urgent referral to urology He is suppose to have a leg bag. Does he have that?

## 2020-07-30 LAB — URINE CULTURE: Culture: >=100000 — AB

## 2020-07-31 ENCOUNTER — Emergency Department (HOSPITAL_COMMUNITY)
Admission: EM | Admit: 2020-07-31 | Discharge: 2020-07-31 | Disposition: A | Discharge status: Home / Self Care | Payer: Medicare Other | Attending: Emergency Medicine | Admitting: Emergency Medicine

## 2020-07-31 ENCOUNTER — Encounter (HOSPITAL_COMMUNITY): Payer: Self-pay

## 2020-07-31 ENCOUNTER — Other Ambulatory Visit: Payer: Self-pay

## 2020-07-31 DIAGNOSIS — X58XXXA Exposure to other specified factors, initial encounter: Secondary | ICD-10-CM | POA: Insufficient documentation

## 2020-07-31 DIAGNOSIS — I2510 Atherosclerotic heart disease of native coronary artery without angina pectoris: Secondary | ICD-10-CM | POA: Insufficient documentation

## 2020-07-31 DIAGNOSIS — J449 Chronic obstructive pulmonary disease, unspecified: Secondary | ICD-10-CM | POA: Insufficient documentation

## 2020-07-31 DIAGNOSIS — I5042 Chronic combined systolic (congestive) and diastolic (congestive) heart failure: Secondary | ICD-10-CM | POA: Diagnosis not present

## 2020-07-31 DIAGNOSIS — T83091A Other mechanical complication of indwelling urethral catheter, initial encounter: Secondary | ICD-10-CM | POA: Diagnosis not present

## 2020-07-31 DIAGNOSIS — Z79899 Other long term (current) drug therapy: Secondary | ICD-10-CM | POA: Diagnosis not present

## 2020-07-31 DIAGNOSIS — Z7901 Long term (current) use of anticoagulants: Secondary | ICD-10-CM | POA: Diagnosis not present

## 2020-07-31 DIAGNOSIS — F17200 Nicotine dependence, unspecified, uncomplicated: Secondary | ICD-10-CM | POA: Diagnosis not present

## 2020-07-31 DIAGNOSIS — T83011A Breakdown (mechanical) of indwelling urethral catheter, initial encounter: Secondary | ICD-10-CM

## 2020-07-31 DIAGNOSIS — Z85118 Personal history of other malignant neoplasm of bronchus and lung: Secondary | ICD-10-CM | POA: Diagnosis not present

## 2020-07-31 NOTE — ED Notes (Signed)
New stat-lock applied. Per EDP, pt provided with extra stat-locks in case this happens again.

## 2020-07-31 NOTE — ED Triage Notes (Signed)
Pt sts securing device for foley catheter fell off. Also sts leaking yesterday denies leaking currently.

## 2020-07-31 NOTE — ED Provider Notes (Signed)
[facility] DEPT Provider Note   CSN: [id number] Arrival date & time: 07/31/20  0227     History No chief complaint on file.   Jared Stevens is a 70 y.o. male.  Patient presents to the emergency department for evaluation of problem with his Foley catheter.  He has a Foley catheter in place because of urinary retention.  The retention device that is attached to his leg broke and the tubing is hanging.  No problems with drainage.        Past Medical History:  Diagnosis Date  . Chronic obstructive pulmonary disease   . Combined systolic and diastolic CHF    Mixed Ischemic/Non-Ischemic CM // Echo 09/2018: EF 15 // cMRI 10/2018: EF 11, inf scar // Echo 12/2018: EF 20-25, Gr 3 DD // Not a candidate for ICD due to comorbid illnesses   . Coronary artery disease    cath 09/2018: RCA chronically occluded >> Med Rx  . 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 Hx of recurrent R pleural effusion    s/p thoracentesis  . Hypercholesteremia   . Lung cancer ([facility]) 03/2016  . Mixed Ischemic and Non-Ischemic Cardiomyopathy   . Non-small cell carcinoma of lung   . Nonsustained ventricular tachycardia     Patient Active Problem List   Diagnosis Date Noted  . Acute urinary retention 07/28/2020  . Long term (current) use of anticoagulants 01/08/2020  . Long term current use of anticoagulant therapy 01/01/2020  . Chronic combined systolic and diastolic CHF (congestive heart failure) ([facility]) 10/31/2019  . Neurocognitive deficits 11/06/2018  . Encephalopathy 10/30/2018  . Stroke (cerebrum) ([facility]) 10/21/2018  . 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 Recurrent right pleural effusion   . Acute systolic CHF (congestive heart failure) ([facility])   . CHF exacerbation ([facility]) 09/27/2018  . History of lung cancer 09/27/2018  . Acute on chronic respiratory failure with hypoxia ([facility]) 09/27/2018  . Elevated brain natriuretic  peptide (BNP) level 09/27/2018  . COPD (chronic obstructive pulmonary disease) ([facility]) 09/27/2018  . Hyperlipidemia 09/27/2018  . Tobacco abuse 09/27/2018  . Non-small cell lung cancer, right ([facility]) 08/26/2018    Past Surgical History:  Procedure Laterality Date  . Arm Surgery    . HERNIA REPAIR    . IR ANGIO INTRA EXTRACRAN SEL COM CAROTID INNOMINATE UNI L MOD SED  10/21/2018  . IR ANGIO VERTEBRAL SEL SUBCLAVIAN INNOMINATE UNI R MOD SED  10/21/2018  . IR CT HEAD LTD  10/21/2018  . IR PERCUTANEOUS ART THROMBECTOMY/INFUSION INTRACRANIAL INC DIAG ANGIO  10/21/2018  . IR THORACENTESIS ASP PLEURAL SPACE W/IMG GUIDE  09/27/2018  . RADIOLOGY WITH ANESTHESIA N/A 10/21/2018   Procedure: RADIOLOGY WITH ANESTHESIA;  Surgeon: Luanne Bras, MD;  Location: [facility];  Service: Radiology;  Laterality: N/A;  . REPLANTATION THUMB    . RIGHT/LEFT HEART CATH AND CORONARY ANGIOGRAPHY N/A 10/01/2018   Procedure: RIGHT/LEFT HEART CATH AND CORONARY ANGIOGRAPHY;  Surgeon: Troy Sine, MD;  Location: [facility];  Service: Cardiovascular;  Laterality: N/A;       Family History  Problem Relation Age of Onset  . Aneurysm Mother     Social History   Tobacco Use  . Smoking status: Current Some Day Smoker  . Smokeless tobacco: Never Used  Vaping Use  . Vaping Use: Never used  Substance Use Topics  . Alcohol use: Yes    Alcohol/week: 2.0  standard drinks    Types: 2 Cans of beer per week    Comment: 1-2 beers weekly  . Drug use: Not Currently    Home Medications Prior to Admission medications   Medication Sig Start Date End Date Taking? Authorizing Provider  amitriptyline (ELAVIL) 50 MG tablet Take 50 mg by mouth at bedtime as needed.  09/17/18   [provider]  atorvastatin (LIPITOR) 80 MG tablet Take 1 tablet (80 mg total) by mouth daily. 09/05/19 12/04/19  Nahser, Wonda Cheng, MD  carvedilol (COREG) 3.125 MG tablet Take 1 tablet (3.125 mg total) by mouth 2 (two) times daily. 04/26/20    Nahser, Wonda Cheng, MD  cephALEXin (KEFLEX) 500 MG capsule Take 1 capsule (500 mg total) by mouth 4 (four) times daily. 07/28/20   Molpus, John, MD  Cyanocobalamin (B-12) 2500 MCG SUBL Place under the tongue.    [provider]  digoxin (LANOXIN) 0.125 MG tablet Take 1 tablet (0.125 mg total) by mouth daily. Prescribed by Dustin Folks, NP on 10/20/19 05/17/20   [name], [name], NP  furosemide (LASIX) 40 MG tablet Take 1 tablet (40 mg total) by mouth daily. 10/30/18   Rosalin Hawking, MD  latanoprost (XALATAN) 0.005 % ophthalmic solution Place 1 drop into both eyes 2 (two) times daily.  03/12/18   [provider]  spironolactone (ALDACTONE) 25 MG tablet Take 0.5 tablets (12.5 mg total) by mouth every other day. Changed by Dustin Folks NP on 08/09/19 01/24/20   Flossie Buffy, NP  SYMBICORT 160-4.5 MCG/ACT inhaler INL 2 PFS PO BID IN THE MORNING AND IN THE EVE 10/28/18   [provider]  warfarin (COUMADIN) 6 MG tablet TAKE 1 & 1/2 [place] 1 TAB EVERY MONDAY,TUESDAY, WEDNESDAY,& THURSDAY. 06/24/20   [name], [name], NP    Allergies    Patient has no known allergies.  Review of Systems   Review of Systems  Gastrointestinal: Negative.   Genitourinary: Negative.     Physical Exam Updated Vital Signs BP 125/77 (BP Location: Left Arm)   Pulse 87   Temp 98.9 F (37.2 C) (Oral)   Resp 18   SpO2 98%   Physical Exam Vitals and nursing note reviewed.  Constitutional:      General: He is not in acute distress.    Appearance: Normal appearance. He is well-developed and well-nourished.  HENT:     Head: Normocephalic and atraumatic.     Right Ear: Hearing normal.     Left Ear: Hearing normal.     Nose: Nose normal.     Mouth/Throat:     Mouth: Oropharynx is clear and moist and mucous membranes are normal.  Eyes:     Extraocular Movements: EOM normal.     Conjunctiva/sclera: Conjunctivae normal.     Pupils: Pupils are equal, round, and  reactive to light.  Cardiovascular:     Rate and Rhythm: Regular rhythm.     Heart sounds: S1 normal and S2 normal. No murmur heard. No friction rub. No gallop.   Pulmonary:     Effort: Pulmonary effort is normal. No respiratory distress.     Breath sounds: Normal breath sounds.  Chest:     Chest wall: No tenderness.  Abdominal:     General: Bowel sounds are normal.     Palpations: Abdomen is soft. There is no hepatosplenomegaly.     Tenderness: There is no abdominal tenderness. There is no guarding or rebound. Negative signs include Murphy's  sign and McBurney's sign.     Hernia: No hernia is present.  Musculoskeletal:        General: Normal range of motion.     Cervical back: Normal range of motion and neck supple.  Skin:    General: Skin is warm, dry and intact.     Findings: No rash.     Nails: There is no cyanosis.  Neurological:     Mental Status: He is alert and oriented to person, place, and time.     GCS: GCS eye subscore is 4. GCS verbal subscore is 5. GCS motor subscore is 6.     Cranial Nerves: No cranial nerve deficit.     Sensory: No sensory deficit.     Coordination: Coordination normal.     Deep Tendon Reflexes: Strength normal.  Psychiatric:        Mood and Affect: Mood and affect normal.        Speech: Speech normal.        Behavior: Behavior normal.        Thought Content: Thought content normal.     ED Results / Procedures / Treatments   Labs (all labs ordered are listed, but only abnormal results are displayed) Labs Reviewed - No data to display  EKG None  Radiology No results found.  Procedures Procedures   Medications Ordered in ED Medications - No data to display  ED Course  I have reviewed the triage vital signs and the nursing notes.  Pertinent labs & imaging results that were available during my care of the patient were reviewed by me and considered in my medical decision making (see chart for details).    MDM  Rules/Calculators/A&P                          Catheter fixed.  No other problems.  Final Clinical Impression(s) / ED Diagnoses Final diagnoses:  Malfunction of Foley catheter, initial encounter [facility])    Rx / DC Orders ED Discharge Orders    None       Jden Want, Gwenyth Allegra, MD 07/31/20 [phone]

## 2020-08-01 ENCOUNTER — Telehealth: Payer: Self-pay | Admitting: Emergency Medicine

## 2020-08-01 NOTE — Telephone Encounter (Signed)
Post ED Visit - Positive Culture Follow-up  Culture report reviewed by antimicrobial stewardship pharmacist: [place] Team []  Elenor Quinones, Pharm.D. []  Heide Guile, Pharm.D., BCPS AQ-ID []  Parks Neptune, Pharm.D., BCPS []  Alycia Rossetti, Pharm.D., BCPS []  [place], Pharm.D., BCPS, AAHIVP []  Legrand Como, Pharm.D., BCPS, AAHIVP []  Salome Arnt, PharmD, BCPS []  Johnnette Gourd, PharmD, BCPS []  Hughes Better, PharmD, BCPS []  Leeroy Cha, PharmD []  Laqueta Linden, PharmD, BCPS []  Albertina Parr, PharmD  [place] Team []  Leodis Sias, PharmD []  Lindell Spar, PharmD []  Royetta Asal, PharmD []  Graylin Shiver, Rph []  Rema Fendt) Glennon Mac, PharmD []  Arlyn Dunning, PharmD []  Netta Cedars, PharmD []  Dia Sitter, PharmD []  Leone Haven, PharmD []  Gretta Arab, PharmD []  Theodis Shove, PharmD []  Peggyann Juba, PharmD [x]  Reuel Boom, PharmD   Positive urine culture Treated with Cephalexin, organism sensitive to the same and no further patient follow-up is required at this time.  Sandi Raveling Zailee Vallely 08/01/2020, 12:28 PM

## 2020-08-02 NOTE — Progress Notes (Unsigned)
Cardiology Office Note:    Date:  08/03/2020   ID:  Jared Stevens, DOB [birth year], MRN [id number]  PCP:  Jared Buffy, NP   [place]  Cardiologist:  Jared Moores, MD   Advanced Practice Provider:  Liliane Shi, PA-C Electrophysiologist:  None       Referring MD: Jared Buffy, NP   Chief Complaint:  Follow-up (CHF, CAD)    Patient Profile:    Jared Stevens is a 70 y.o. male with:   (HFrEF) heart failure with reduced ejection fraction  ? 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 BP limits med titration ? Echocardiogram 09/2018: EF 15 ? cMRI 10/2018: EF 11, inf scar ? Echo 12/2018: EF 20-25, Gr 3 DD ? Per EP, not a candidate for ICD  Coronary artery disease ? 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 Hx of Recurrent R Pleural Effusion ? 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 2/2 to non-ischemic cardiomyopathy >> Coumadin Rx (managed by [facility])  NSVT  Aortic atherosclerosis   Prior CV studies: Echocardiogram 01/08/2019 EF 20-25, Gr 3 DD, normal RVSF, mild RAE  Cardiac MRI 10/28/2018 IMPRESSION: 1. 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 Mild RVE with hypokinesis RVEF 22% 3. Small but circumferential pericardial effusion 4. Large bilateral pleural effusions 5. No mural apical thrombus 6. Full thickness scar involving the mid and apical inferior wall with small area of subendocardial scar at apex 7. Mild MR 8. Dilated IVC 9. Marked dyssynchrony between the anterior and inferior walls  R/L Cardiac catheterization 10/01/2018 RCA prox 100 (L-R collats) EF 15  Echocardiogram 09/27/2018 EF 15, Gr 1 DD, severely reduced RVSF, trivial eff, mild MAC  History of Present Illness:    Jared Stevens was last seen by Dr.  Acie Stevens in 6/21.  He returns for f/u.  He is here alone.  Overall, he has been doing well without chest discomfort, significant shortness of breath, orthopnea, leg edema or syncope.  He recently was diagnosed with a UTI with urinary retention.  He has an indwelling Foley catheter.  He is on cephalexin.  He sees urology at the end of this week.  He does continue to smoke      Past Medical History:  Diagnosis Date  . Chronic obstructive pulmonary disease   . Combined systolic and diastolic CHF    Mixed Ischemic/Non-Ischemic CM // Echo 09/2018: EF 15 // cMRI 10/2018: EF 11, inf scar // Echo 12/2018: EF 20-25, Gr 3 DD // Not a candidate for ICD due to comorbid illnesses   . Coronary artery disease    cath 09/2018: RCA chronically occluded >> Med Rx  . 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 Hx of recurrent R pleural effusion    s/p thoracentesis  . Hypercholesteremia   . Lung cancer ([place]) 03/2016  . Mixed Ischemic and Non-Ischemic Cardiomyopathy   . Non-small cell carcinoma of lung   . Nonsustained ventricular tachycardia     Current Medications: Current Meds  Medication Sig  . amitriptyline (ELAVIL) 50 MG tablet Take 50 mg by mouth at bedtime as needed.   Marland Kitchen atorvastatin (LIPITOR) 80 MG tablet Take 80 mg by mouth daily.  Marland Kitchen  carvedilol (COREG) 3.125 MG tablet Take 1 tablet (3.125 mg total) by mouth 2 (two) times daily.  . cephALEXin (KEFLEX) 500 MG capsule Take 1 capsule (500 mg total) by mouth 4 (four) times daily.  . Cyanocobalamin (B-12) 2500 MCG SUBL Place under the tongue.  . digoxin (LANOXIN) 0.125 MG tablet Take 1 tablet (0.125 mg total) by mouth daily. Prescribed by Jared Folks, NP on 10/20/19  . furosemide (LASIX) 40 MG tablet Take 1 tablet (40 mg total) by mouth daily.  Marland Kitchen latanoprost (XALATAN) 0.005 % ophthalmic solution Place 1 drop into both eyes daily.  Marland Kitchen spironolactone (ALDACTONE) 25 MG tablet Take 0.5 tablets (12.5 mg total) by mouth  every other day. Changed by Jared Folks NP on 08/09/19  . SYMBICORT 160-4.5 MCG/ACT inhaler INL 2 PFS PO BID IN THE MORNING AND IN THE EVE  . warfarin (COUMADIN) 6 MG tablet TAKE 1 & 1/2 [place] 1 TAB EVERY MONDAY,TUESDAY, WEDNESDAY,& THURSDAY.     Allergies:   Patient has no known allergies.   Social History   Tobacco Use  . Smoking status: Current Some Day Smoker  . Smokeless tobacco: Never Used  Vaping Use  . Vaping Use: Never used  Substance Use Topics  . Alcohol use: Yes    Alcohol/week: 2.0 standard drinks    Types: 2 Cans of beer per week    Comment: 1-2 beers weekly  . Drug use: Not Currently     Family Hx: The patient's family history includes Aneurysm in his mother.  ROS   EKGs/Labs/Other Test Reviewed:    EKG:  EKG is  ordered today.  The ekg ordered today demonstrates normal sinus rhythm, heart rate 83, normal axis, nonspecific ST-T wave changes, QTC 446, inferior Q waves, no significant change  Recent Labs: 02/12/2020: ALT 20; BUN 17; Creatinine 1.00; Hemoglobin 15.3; Platelet Count 305; Potassium 4.0; Sodium 142   Recent Lipid Panel Lab Results  Component Value Date/Time   CHOL 171 07/01/2019 12:10 PM   TRIG 52 07/01/2019 12:10 PM   HDL 68 07/01/2019 12:10 PM   CHOLHDL 2.5 07/01/2019 12:10 PM   CHOLHDL 3.2 10/22/2018 04:25 AM   LDLCALC 92 07/01/2019 12:10 PM      Risk Assessment/Calculations:      Physical Exam:    VS:  BP 118/72   Pulse 83   Ht 6' 1" (1.854 m)   Wt 142 lb 9.6 oz (64.7 kg)   SpO2 94%   BMI 18.81 kg/m     Wt Readings from Last 3 Encounters:  08/03/20 142 lb 9.6 oz (64.7 kg)  07/06/20 140 lb 6.4 oz (63.7 kg)  06/22/20 139 lb 9.6 oz (63.3 kg)     Constitutional:      Appearance: Not in distress. Cachectic.  Neck:     Vascular: No JVR. JVD normal.  Pulmonary:     Breath sounds: No rales.     Comments: R sided rhonchi with exp Cardiovascular:     Normal rate. Regular rhythm. Normal S1.  Normal S2.     Murmurs: There is no murmur.  Edema:    Peripheral edema absent.  Abdominal:     Palpations: Abdomen is soft.  Skin:    General: Skin is warm and dry.  Neurological:     General: No focal deficit present.     Mental Status: Alert and oriented to person, place and time.     Cranial Nerves: Cranial nerves are intact.  ASSESSMENT & PLAN:    1. HFrEF (heart failure with reduced ejection fraction) (HCC) EF 20-25.  NYHA II.  Volume status appears stable.  Blood pressure has limited titration of CHF medications.  His blood pressure could likely tolerate a very small dose of lisinopril.  This can be considered if his pressures remain the same as today.  For now, continue current dose of carvedilol, digoxin, spironolactone.  Follow-up in 6 months.  2. Coronary artery disease involving native coronary artery of native heart without angina pectoris Known chronic occlusion of the RCA.  He is not having angina.  He is not on aspirin as he is on warfarin.  Continue atorvastatin, carvedilol.  3. Non-small cell lung cancer, right ([place]) Continue follow-up with oncology.  4. History of CVA (cerebrovascular accident) He is on chronic warfarin.  This is managed by primary care.   5. Hyperlipidemia  Obtain f/u CMET, fasting Lipids today.  Continue atorvastatin.     Dispo:  Return in about 6 months (around 02/03/2021) for Routine Follow Up, w/ Dr. Acie Stevens, or Richardson Dopp, PA-C, in person.   Medication Adjustments/Labs and Tests Ordered: Current medicines are reviewed at length with the patient today.  Concerns regarding medicines are outlined above.  Tests Ordered: Orders Placed This Encounter  Procedures  . Comp Met (CMET)  . Lipid Profile  . EKG 12-Lead   Medication Changes: No orders of the defined types were placed in this encounter.   Signed, Richardson Dopp, PA-C  08/03/2020 9:46 AM    [facility] [address], [place], [place]  [address] Phone:  [phone]; Fax: [phone]

## 2020-08-03 ENCOUNTER — Other Ambulatory Visit: Payer: Self-pay

## 2020-08-03 ENCOUNTER — Ambulatory Visit (INDEPENDENT_AMBULATORY_CARE_PROVIDER_SITE_OTHER): Payer: Medicare Other | Admitting: Physician Assistant

## 2020-08-03 ENCOUNTER — Encounter: Payer: Self-pay | Admitting: Physician Assistant

## 2020-08-03 VITALS — BP 118/72 | HR 83 | Ht 73.0 in | Wt 142.6 lb

## 2020-08-03 DIAGNOSIS — E782 Mixed hyperlipidemia: Secondary | ICD-10-CM | POA: Diagnosis not present

## 2020-08-03 DIAGNOSIS — Z8673 Personal history of transient ischemic attack (TIA), and cerebral infarction without residual deficits: Secondary | ICD-10-CM

## 2020-08-03 DIAGNOSIS — I10 Essential (primary) hypertension: Secondary | ICD-10-CM

## 2020-08-03 DIAGNOSIS — I2510 Atherosclerotic heart disease of native coronary artery without angina pectoris: Secondary | ICD-10-CM

## 2020-08-03 DIAGNOSIS — I5020 Unspecified systolic (congestive) heart failure: Secondary | ICD-10-CM

## 2020-08-03 DIAGNOSIS — C3491 Malignant neoplasm of unspecified part of right bronchus or lung: Secondary | ICD-10-CM | POA: Diagnosis not present

## 2020-08-03 LAB — COMPREHENSIVE METABOLIC PANEL
ALT: 15 IU/L (ref 0–44)
AST: 18 IU/L (ref 0–40)
Albumin/Globulin Ratio: 1.4 (ref 1.2–2.2)
Albumin: 3.8 g/dL (ref 3.8–4.8)
Alkaline Phosphatase: 92 IU/L (ref 44–121)
BUN/Creatinine Ratio: 25 — ABNORMAL HIGH (ref 10–24)
BUN: 17 mg/dL (ref 8–27)
Bilirubin Total: 0.4 mg/dL (ref 0.0–1.2)
CO2: 22 mmol/L (ref 20–29)
Calcium: 9.1 mg/dL (ref 8.6–10.2)
Chloride: 107 mmol/L — ABNORMAL HIGH (ref 96–106)
Creatinine, Ser: 0.68 mg/dL — ABNORMAL LOW (ref 0.76–1.27)
Globulin, Total: 2.8 g/dL (ref 1.5–4.5)
Glucose: 99 mg/dL (ref 65–99)
Potassium: 4.1 mmol/L (ref 3.5–5.2)
Sodium: 141 mmol/L (ref 134–144)
Total Protein: 6.6 g/dL (ref 6.0–8.5)
eGFR: 101 mL/min/{1.73_m2} (ref >59)

## 2020-08-03 LAB — LIPID PANEL
Chol/HDL Ratio: 2.4 ratio (ref 0.0–5.0)
Cholesterol, Total: 140 mg/dL (ref 100–199)
HDL: 59 mg/dL (ref >39)
LDL Chol Calc (NIH): 72 mg/dL (ref 0–99)
Triglycerides: 39 mg/dL (ref 0–149)
VLDL Cholesterol Cal: 9 mg/dL (ref 5–40)

## 2020-08-03 NOTE — Patient Instructions (Signed)
Medication Instructions:  Your physician recommends that you continue on your current medications as directed. Please refer to the Current Medication list given to you today.  *If you need a refill on your cardiac medications before your next appointment, please call your pharmacy*   Lab Work: Your physician recommends that you have FASTING lipid profile today with a CMET.  If you have labs (blood work) drawn today and your tests are completely normal, you will receive your results only by: Marland Kitchen MyChart Message (if you have MyChart) OR . A paper copy in the mail If you have any lab test that is abnormal or we need to change your treatment, we will call you to review the results.   Testing/Procedures: -None   Follow-Up: At [facility], you and your health needs are our priority.  As part of our continuing mission to provide you with exceptional heart care, we have created designated Provider Care Teams.  These Care Teams include your primary Cardiologist (physician) and Advanced Practice Providers (APPs -  Physician Assistants and Nurse Practitioners) who all work together to provide you with the care you need, when you need it.  We recommend signing up for the patient portal called "MyChart".  Sign up information is provided on this After Visit Summary.  MyChart is used to connect with patients for Virtual Visits (Telemedicine).  Patients are able to view lab/test results, encounter notes, upcoming appointments, etc.  Non-urgent messages can be sent to your provider as well.   To learn more about what you can do with MyChart, go to [email or web address].    Your next appointment:   6 month(s)  The format for your next appointment:   In Person  Provider:   You may see Mertie Moores, MD or one of the following Advanced Practice Providers on your designated Care Team:    Richardson Dopp, PA-C  Robbie Lis, [place]    Other Instructions Your physician wants you to follow-up in: 6  months with Dr.Cooper or Richardson Dopp, PA.  You will receive a reminder letter in the mail two months in advance. If you don't receive a letter, please call our office to schedule the follow-up appointment.

## 2020-08-04 ENCOUNTER — Other Ambulatory Visit: Payer: Self-pay

## 2020-08-05 ENCOUNTER — Encounter: Payer: Self-pay | Admitting: Nurse Practitioner

## 2020-08-05 ENCOUNTER — Ambulatory Visit (INDEPENDENT_AMBULATORY_CARE_PROVIDER_SITE_OTHER): Payer: Medicare Other | Admitting: Nurse Practitioner

## 2020-08-05 ENCOUNTER — Telehealth: Payer: Self-pay | Admitting: *Deleted

## 2020-08-05 VITALS — BP 114/72 | HR 74 | Temp 98.4°F | Ht 73.0 in | Wt 142.2 lb

## 2020-08-05 DIAGNOSIS — F17209 Nicotine dependence, unspecified, with unspecified nicotine-induced disorders: Secondary | ICD-10-CM | POA: Diagnosis not present

## 2020-08-05 DIAGNOSIS — E782 Mixed hyperlipidemia: Secondary | ICD-10-CM

## 2020-08-05 DIAGNOSIS — Z7901 Long term (current) use of anticoagulants: Secondary | ICD-10-CM | POA: Diagnosis not present

## 2020-08-05 LAB — POCT INR: INR: 1.9 — AB (ref 2.0–3.0)

## 2020-08-05 MED ORDER — ROSUVASTATIN CALCIUM 40 MG PO TABS: 40.0000 mg | ORAL_TABLET | Freq: Every day | ORAL | 3 refills | Status: DC

## 2020-08-05 MED ORDER — WARFARIN SODIUM 6 MG PO TABS: ORAL_TABLET | ORAL | 1 refills | Status: DC

## 2020-08-05 NOTE — Patient Instructions (Addendum)
Coumadin: Take 12mg  today, then resume 1 & 1/2 [place] 1 TAB EVERY MONDAY,TUESDAY, WEDNESDAY,& THURSDAY. Schedule nurse visit in 13month.  Maintain appt with urology  maintain current medications  Continue use of nicorette gum to help quit tobacco use.

## 2020-08-05 NOTE — Assessment & Plan Note (Addendum)
No GI/GI bleed, no ABD pain or nausea, no CP or SOB INR of 1.9 today due to recent completion of oral abx. Advised to take 2tabs today, then resume 1 & 1/2 [place] 1 TAB EVERY MONDAY,TUESDAY, WEDNESDAY,& THURSDAY. Schedule nurse visit in 46month.

## 2020-08-05 NOTE — Telephone Encounter (Signed)
-----   Message from Liliane Shi, [place] sent at 08/04/2020  4:59 PM EST ----- Creatinine, K+, ALT normal.  LDL is good but is still above goal. PLAN:  -DC atorvastatin -Start rosuvastatin 40 mg daily -Fasting lipids, ALT in 3 months Richardson Dopp, PA-C    08/04/2020 4:57 PM

## 2020-08-05 NOTE — Assessment & Plan Note (Signed)
Cutting down with use of nicorette gum. He has cut down to <1/4ppd for last 26months

## 2020-08-05 NOTE — Progress Notes (Signed)
Subjective:  Patient ID: Jared Stevens, male    DOB: 11-26-1950  Age: 70 y.o. MRN: [id number]  CC: Follow-up (3 month f/u on HTN, INR, and hyperlipidemia. )  HPI  Tobacco use disorder, continuous Cutting down with use of nicorette gum. He has cut down to <1/4ppd for last 14months  Long term current use of anticoagulant therapy No GI/GI bleed, no ABD pain or nausea, no CP or SOB INR of 1.9 today due to recent completion of oral abx. Advised to take 2tabs today, then resume 1 & 1/2 [place] 1 TAB EVERY MONDAY,TUESDAY, WEDNESDAY,& THURSDAY. Schedule nurse visit in 70month.   Wt Readings from Last 3 Encounters:  08/05/20 142 lb 3.2 oz (64.5 kg)  08/03/20 142 lb 9.6 oz (64.7 kg)  07/06/20 140 lb 6.4 oz (63.7 kg)   Reviewed past Medical, Social and Family history today.  Outpatient Medications Prior to Visit  Medication Sig Dispense Refill  . amitriptyline (ELAVIL) 50 MG tablet Take 50 mg by mouth at bedtime as needed.     . carvedilol (COREG) 3.125 MG tablet Take 1 tablet (3.125 mg total) by mouth 2 (two) times daily. 180 tablet 1  . Cyanocobalamin (B-12) 2500 MCG SUBL Place under the tongue.    . digoxin (LANOXIN) 0.125 MG tablet Take 1 tablet (0.125 mg total) by mouth daily. Prescribed by Dustin Folks, NP on 10/20/19 90 tablet 3  . furosemide (LASIX) 40 MG tablet Take 1 tablet (40 mg total) by mouth daily. 30 tablet 2  . latanoprost (XALATAN) 0.005 % ophthalmic solution Place 1 drop into both eyes daily.  11  . rosuvastatin (CRESTOR) 40 MG tablet Take 1 tablet (40 mg total) by mouth daily. 90 tablet 3  . spironolactone (ALDACTONE) 25 MG tablet Take 0.5 tablets (12.5 mg total) by mouth every other day. Changed by Dustin Folks NP on 08/09/19    . SYMBICORT 160-4.5 MCG/ACT inhaler INL 2 PFS PO BID IN THE MORNING AND IN THE EVE    . warfarin (COUMADIN) 6 MG tablet TAKE 1 & 1/2 [place] 1 TAB EVERY MONDAY,TUESDAY, WEDNESDAY,& THURSDAY.  34 tablet 1  . cephALEXin (KEFLEX) 500 MG capsule Take 1 capsule (500 mg total) by mouth 4 (four) times daily. (Patient not taking: Reported on 08/05/2020) 28 capsule 0   No facility-administered medications prior to visit.    ROS See HPI  Objective:  BP 114/72 (BP Location: Left Arm, Patient Position: Sitting, Cuff Size: Normal)   Pulse 74   Temp 98.4 F (36.9 C) (Temporal)   Ht 6\' 1"  (1.854 m)   Wt 142 lb 3.2 oz (64.5 kg)   SpO2 95%   BMI 18.76 kg/m   Physical Exam Vitals reviewed.  Constitutional:      General: He is not in acute distress.    Appearance: He is well-developed.  HENT:     Mouth/Throat:     Pharynx: No oropharyngeal exudate.  Cardiovascular:     Rate and Rhythm: Normal rate and regular rhythm.     Pulses: Normal pulses.     Heart sounds: Normal heart sounds.  Pulmonary:     Effort: Pulmonary effort is normal. No respiratory distress.     Breath sounds: Normal breath sounds.  Chest:     Chest wall: No tenderness.  Abdominal:     General: Bowel sounds are normal.     Palpations: Abdomen is soft.  Musculoskeletal:  General: Normal range of motion.     Right lower leg: No edema.     Left lower leg: No edema.  Neurological:     Mental Status: He is alert and oriented to person, place, and time.     Cranial Nerves: No cranial nerve deficit.     Motor: No weakness.     Coordination: Coordination is intact.     Gait: Gait is intact.     Deep Tendon Reflexes:     Reflex Scores:      Patellar reflexes are 2+ on the right side and 2+ on the left side.   Assessment & Plan:  This visit occurred during the SARS-CoV-2 public health emergency.  Safety protocols were in place, including screening questions prior to the visit, additional usage of staff PPE, and extensive cleaning of exam room while observing appropriate contact time as indicated for disinfecting solutions.   Jared Stevens was seen today for follow-up.  Diagnoses and all orders for this  visit:  Long term current use of anticoagulant therapy -     POCT INR  Tobacco use disorder, continuous   Problem List Items Addressed This Visit      Other   Long term current use of anticoagulant therapy - Primary    No GI/GI bleed, no ABD pain or nausea, no CP or SOB INR of 1.9 today due to recent completion of oral abx. Advised to take 2tabs today, then resume 1 & 1/2 [place] 1 TAB EVERY MONDAY,TUESDAY, WEDNESDAY,& THURSDAY. Schedule nurse visit in 21month.       Relevant Orders   POCT INR (Completed)   Tobacco use disorder, continuous    Cutting down with use of nicorette gum. He has cut down to <1/4ppd for last 55months         Follow-up: Return in about 6 months (around 02/05/2021) for HTN and hyperlipidemia.  Wilfred Lacy, NP

## 2020-08-06 DIAGNOSIS — R338 Other retention of urine: Secondary | ICD-10-CM | POA: Diagnosis not present

## 2020-08-10 ENCOUNTER — Telehealth: Payer: Self-pay | Admitting: Nurse Practitioner

## 2020-08-10 NOTE — Telephone Encounter (Signed)
Pharmacy has been updated.

## 2020-08-10 NOTE — Telephone Encounter (Signed)
Patient is changing pharmacies so he needs it updated in his chart.   Pharmacy information: [facility]

## 2020-08-12 ENCOUNTER — Other Ambulatory Visit: Payer: Self-pay | Admitting: Nurse Practitioner

## 2020-08-12 DIAGNOSIS — Z7901 Long term (current) use of anticoagulants: Secondary | ICD-10-CM

## 2020-08-12 DIAGNOSIS — H401132 Primary open-angle glaucoma, bilateral, moderate stage: Secondary | ICD-10-CM | POA: Diagnosis not present

## 2020-08-25 ENCOUNTER — Other Ambulatory Visit: Payer: Self-pay

## 2020-08-25 ENCOUNTER — Other Ambulatory Visit: Payer: 59 | Admitting: Hospice

## 2020-08-25 DIAGNOSIS — R339 Retention of urine, unspecified: Secondary | ICD-10-CM

## 2020-08-25 DIAGNOSIS — Z85118 Personal history of other malignant neoplasm of bronchus and lung: Secondary | ICD-10-CM

## 2020-08-25 DIAGNOSIS — Z515 Encounter for palliative care: Secondary | ICD-10-CM | POA: Diagnosis not present

## 2020-08-25 NOTE — Progress Notes (Signed)
[facility] Consult Note Telephone: [phone]  Fax: [phone]  PATIENT NAME: Jared Stevens DOB: 10-10-1950 MRN: [id number]  PRIMARY CARE PROVIDER:   Flossie Buffy, NP Stevens, Jared Brooke, NP [place],  [place] [address]  REFERRING PROVIDER:Smith, Jared Limes., FNP  RESPONSIBLE PARTY:Self [phone] Jared Stevens, daughter [phone]   Visit is to build trust and highlight Palliative Medicine as specialized medical care for people living with serious illness, aimed at facilitating better quality of life through symptoms relief, assisting with advance care plan and establishing goals of care.   CHIEF COMPLAINT: Palliative focused Visit/urinary retention  RECOMMENDATIONS/PLAN:   1. Advance Care Planning/Code Status: Code status reviewed today. Patient affirmed he remains a Full code.  2. Goals of Care: Goals of care include to maximize quality of life and symptom management.  Visit consisted of counseling and education dealing with the complex and emotionally intense issues of symptom management and palliative care in the setting of serious and potentially life-threatening illness. Palliative care team will continue to support patient, patient's family, and medical team.  I spent 20  minutes providing this consultation. More than 50% of the time in this consultation was spent on coordinating communication.  -------------------------------------------------------------------------------------------------------------------------------------------------- 3. Symptom management/Plan:  Urinary Retention: indwelling Foley Cath in place. Follow up appointment with [facility] this Friday.  COPD: No recent exacerbation. CT scan  Of lungs Sept 16 2021- Epic report shows no significant interval change in post treatment appearance of a hypodense mass of the right hilum. No evidence of recurrent  or metastatic disease in the chest. He sees his Oncologist every 6 months for Lung CA surveillance. Appointment with Oncologist the  first week of May 2022.  Patient with noacutecomplaint or concerns at this time.Encouraged ongoing care. Palliative will continue to monitor for symptom management/decline and make recommendations as needed. Return 2 months or prn. Encouraged to call provider sooner with any concerns.   HISTORY OF PRESENT ILLNESS:  Jared Stevens is a 70 y.o. male with multiple medical problems including urinary retention for which patient was seen in ED 07/28/2020. Urinary retention was acute, causing discomfort. In the hospital, .  Approximately 1300 mL of cloudy urine obtained via catherization.  Urinalysis consistent with urinary tract infection and urine sent for culture. Patient sent home with a leg bag, Keflex and referral to urology.  Patient is compliant with his medications,  denied urinary symptoms. History of CVA, CHF, HLD, COPD, lung cancer.  History obtained from review of EMR, discussion with primary team, and  interview with family, caregiver  and/or patient. Records reviewed and summarized above. All 10 point systems reviewed and are negative except as documented in history of present illness above  Review and summarization of Epic records shows history from other than patient.   Palliative Care was asked to follow this patient by consultation request of Stevens, Jared Brooke, NP to help address complex decision making in the context of advance care planning and goals of care clarification.   CODE STATUS: Full  PPS: 60%  HOSPICE ELIGIBILITY/DIAGNOSIS: TBD  PAST MEDICAL HISTORY:  Past Medical History:  Diagnosis Date  . Chronic obstructive pulmonary disease   . Combined systolic and diastolic CHF    Mixed Ischemic/Non-Ischemic CM // Echo 09/2018: EF 15 // cMRI 10/2018: EF 11, inf scar // Echo 12/2018: EF 20-25, Gr 3 DD // Not a candidate for ICD due to comorbid  illnesses   . Coronary artery disease  cath 09/2018: RCA chronically occluded >> Med Rx  . 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 Hx of recurrent R pleural effusion    s/p thoracentesis  . Hypercholesteremia   . Lung cancer ([place]) 03/2016  . Mixed Ischemic and Non-Ischemic Cardiomyopathy   . Non-small cell carcinoma of lung   . Nonsustained ventricular tachycardia      SOCIAL HX: @SOCX  Patient lives at home  for ongoing care   FAMILY HX:  Family History  Problem Relation Age of Onset  . Aneurysm Mother     Review lab tests/diagnostics Contains abnormal dataUrinalysis, Routine w reflex microscopic Urine, Clean Catch Order: [id number]  Status: Final result   Visible to patient: No (inaccessible in MyChart)   Next appt: 09/07/2020 at 10:20 AM in Family Medicine ([facility] NURSE)   0 Result Notes  Component Ref Range & Units 4 wk ago  (07/28/20) 1 yr ago  (10/21/18) 1 yr ago  (09/05/18)  Color, Urine YELLOW YELLOW  YELLOW  YELLOW   APPearance CLEAR HAZYAbnormal  CLEAR  CLEAR   Specific Gravity, Urine 1.005 - 1.030 1.004Low  1.018  1.023   pH 5.0 - 8.0 5.0  6.0  5.0   Glucose, UA NEGATIVE mg/dL NEGATIVE  NEGATIVE  NEGATIVE   Hgb urine dipstick NEGATIVE LARGEAbnormal  SMALLAbnormal  NEGATIVE   Bilirubin Urine NEGATIVE NEGATIVE  NEGATIVE  NEGATIVE   Ketones, ur NEGATIVE mg/dL NEGATIVE  NEGATIVE  NEGATIVE   Protein, ur NEGATIVE mg/dL NEGATIVE  NEGATIVE  30Abnormal   Nitrite NEGATIVE NEGATIVE  NEGATIVE  NEGATIVE   Leukocytes,Ua NEGATIVE LARGEAbnormal  NEGATIVE  NEGATIVE   RBC / HPF 0 - 5 RBC/hpf 21-50  0-5    WBC, UA 0 - 5 WBC/hpf >50High  0-5  0-5   Bacteria, UA NONE SEEN FEWAbnormal  RAREAbnormal  RAREAbnormal   WBC Clumps  PRESENT       Latest GFR by Cockcroft Gault (not valid in AKI or ESRD) CrCl cannot be calculated (Patient's most recent lab result is older than the maximum 21 days  allowed.).  ALLERGIES: No Known Allergies    PERTINENT MEDICATIONS:  Outpatient Encounter Medications as of 08/25/2020  Medication Sig  . amitriptyline (ELAVIL) 50 MG tablet Take 50 mg by mouth at bedtime as needed.   . carvedilol (COREG) 3.125 MG tablet Take 1 tablet (3.125 mg total) by mouth 2 (two) times daily.  . Cyanocobalamin (B-12) 2500 MCG SUBL Place under the tongue.  . digoxin (LANOXIN) 0.125 MG tablet Take 1 tablet (0.125 mg total) by mouth daily. Prescribed by Dustin Folks, NP on 10/20/19  . furosemide (LASIX) 40 MG tablet Take 1 tablet (40 mg total) by mouth daily.  Marland Kitchen latanoprost (XALATAN) 0.005 % ophthalmic solution Place 1 drop into both eyes daily.  . rosuvastatin (CRESTOR) 40 MG tablet Take 1 tablet (40 mg total) by mouth daily.  Marland Kitchen spironolactone (ALDACTONE) 25 MG tablet Take 0.5 tablets (12.5 mg total) by mouth every other day. Changed by Dustin Folks NP on 08/09/19  . SYMBICORT 160-4.5 MCG/ACT inhaler INL 2 PFS PO BID IN THE MORNING AND IN THE EVE  . warfarin (COUMADIN) 6 MG tablet TAKE 1 & 1/2 [place] 1 TAB EVERY MONDAY,TUESDAY, WEDNESDAY,& THURSDAY.   No facility-administered encounter medications on file as of 08/25/2020.     PHYSICAL EXAM General:in no acute distress; appearance consistent with age of record Cardiovascular: regular  rate and rhythm; no edema in BLE Pulmonary: no cough, no increased work of breathing, normal respiratory effort Abdomen: soft, non tender, positive bowel sounds in all quadrants GU:  no suprapubic tenderness; Foley cath in place, patent Eyes: Normal lids, no discharge, sclera anicteric ENMT: Moist mucous membranes Musculoskeletal:  no joint deformity Skin: no rash to visible skin, warm without cyanosis Psych: non-anxious affect Neurological: Weakness but otherwise non focal Heme/lymph/immuno: no bruises, no bleeding  Thank you for the opportunity to participate in the care of Jared Stevens Please call our  office at [phone] if we can be of additional assistance.  Note: Portions of this note were generated with [employer]. Dictation errors may occur despite best attempts at proofreading.  Teodoro Spray, NP

## 2020-08-27 DIAGNOSIS — R338 Other retention of urine: Secondary | ICD-10-CM | POA: Diagnosis not present

## 2020-09-01 DIAGNOSIS — R338 Other retention of urine: Secondary | ICD-10-CM | POA: Diagnosis not present

## 2020-09-07 ENCOUNTER — Other Ambulatory Visit: Payer: Self-pay

## 2020-09-07 ENCOUNTER — Ambulatory Visit (INDEPENDENT_AMBULATORY_CARE_PROVIDER_SITE_OTHER): Payer: Medicare Other

## 2020-09-07 DIAGNOSIS — Z7901 Long term (current) use of anticoagulants: Secondary | ICD-10-CM | POA: Diagnosis not present

## 2020-09-07 LAB — PROTIME-INR
INR: 4.2 ratio — ABNORMAL HIGH (ref 0.8–1.0)
Prothrombin Time: 46.1 s — ABNORMAL HIGH (ref 9.6–13.1)

## 2020-09-07 NOTE — Patient Instructions (Addendum)
Pt here for INR check per  Goal INR = 2-3   Last INR = 1.9  Pt currently takes Coumadin 6 mg Monday-Thursday and 9 mg Friday-Sunday  Date of last Coumadin dose = 08/05/20  Pt denies recent antibiotics, no dietary changes and no unusual bruising / bleeding.  INR today = 4.2  Pt advised per Dr. Ethelene Hal to hold coumadin for 5 days. Restart at 5 mg Monday -Thursday and 7 mg Friday-Sunday and recheck in 2 weeks following start of new dose. Patient notified and verbalized understanding.

## 2020-09-07 NOTE — Progress Notes (Signed)
Goal INR = 2-3   Last INR = 1.9  Pt currently takes Coumadin 6 mg Monday-Thursday and 9 mg Friday-Sunday  Date of last Coumadin dose = 08/05/20  Pt denies recent antibiotics, no dietary changes and no unusual bruising / bleeding.  INR today = 4.2  Pt advised per Dr. Ethelene Hal to hold coumadin for 5 days. Restart at 5 mg Monday -Thursday and 7 mg Friday-Sunday and recheck in 2 weeks following start of new dose. Patient notified and verbalized understanding.

## 2020-09-08 DIAGNOSIS — R338 Other retention of urine: Secondary | ICD-10-CM | POA: Diagnosis not present

## 2020-09-13 ENCOUNTER — Telehealth: Payer: Self-pay

## 2020-09-13 DIAGNOSIS — J449 Chronic obstructive pulmonary disease, unspecified: Secondary | ICD-10-CM

## 2020-09-13 DIAGNOSIS — Z7901 Long term (current) use of anticoagulants: Secondary | ICD-10-CM

## 2020-09-13 MED ORDER — SYMBICORT 160-4.5 MCG/ACT IN AERO: INHALATION_SPRAY | RESPIRATORY_TRACT | 0 refills | Status: DC

## 2020-09-13 MED ORDER — WARFARIN SODIUM 6 MG PO TABS: ORAL_TABLET | ORAL | 1 refills | Status: DC

## 2020-09-13 NOTE — Telephone Encounter (Signed)
Pt states he needs new Rx for new dosages. Pt states he also needs refill on Symbicort. Please advise

## 2020-09-13 NOTE — Telephone Encounter (Signed)
Pt called and asked for call back from [place] about this

## 2020-09-13 NOTE — Telephone Encounter (Signed)
[facility] called. Pt told them Baldo Ash was supposed to have sent in warfarin (COUMADIN) 6 MG tablet  And Symbicort  Please advise

## 2020-09-14 MED ORDER — WARFARIN SODIUM 5 MG PO TABS
ORAL_TABLET | ORAL | 1 refills | Status: DC
Start: 2020-09-14 — End: 2020-10-14

## 2020-09-14 NOTE — Telephone Encounter (Signed)
Patient notified and verbalized understanding. 

## 2020-09-14 NOTE — Addendum Note (Signed)
Addended by: Wilfred Lacy L on: 09/14/2020 10:46 AM   Modules accepted: Orders

## 2020-09-15 ENCOUNTER — Telehealth: Payer: Self-pay | Admitting: Nurse Practitioner

## 2020-09-15 DIAGNOSIS — J449 Chronic obstructive pulmonary disease, unspecified: Secondary | ICD-10-CM

## 2020-09-15 NOTE — Telephone Encounter (Signed)
What is the name of the medication? spironolactone (ALDACTONE) 25 MG tablet [[id number]]    Have you contacted your pharmacy to request a refill? Yes, he needs a refill, he is running low. Pharmacy  [facility], [address]  [address] [place], [place] [address]  Phone:  [phone] Fax:  [phone]     Which pharmacy would you like this sent to?    Patient notified that their request is being sent to the clinical staff for review and that they should receive a call once it is complete. If they do not receive a call within 72 hours they can check with their pharmacy or our office.

## 2020-09-16 ENCOUNTER — Telehealth: Payer: Self-pay

## 2020-09-16 MED ORDER — SPIRONOLACTONE 25 MG PO TABS: 12.5000 mg | ORAL_TABLET | ORAL | 0 refills | Status: DC

## 2020-09-16 NOTE — Telephone Encounter (Signed)
Chart supports Rx, sent in to patient updated pharmacy.

## 2020-09-16 NOTE — Telephone Encounter (Signed)
In his chart. Please advise pt.

## 2020-09-16 NOTE — Telephone Encounter (Signed)
[facility] called requesting a refill of Spironolaction. I see where it was sent today. Disregard message

## 2020-09-16 NOTE — Telephone Encounter (Signed)
Pt is wanting a cb, he's  very concerned he is being over medicated. He would also like to know why Dr. Ethelene Hal is

## 2020-09-16 NOTE — Telephone Encounter (Signed)
Spoke with patient regarding medication and cleared any confusion he may have had.

## 2020-09-21 ENCOUNTER — Telehealth: Payer: Self-pay | Admitting: Internal Medicine

## 2020-09-21 NOTE — Telephone Encounter (Signed)
R/s 5/9 appt to accommodate provider schedule. Called and spoke with pt. Confirmed new dates and time

## 2020-09-29 ENCOUNTER — Inpatient Hospital Stay: Payer: Medicare Other

## 2020-10-01 ENCOUNTER — Telehealth: Payer: Self-pay | Admitting: Nurse Practitioner

## 2020-10-01 ENCOUNTER — Ambulatory Visit (HOSPITAL_COMMUNITY): Admission: RE | Admit: 2020-10-01 | Payer: Medicare Other | Source: Ambulatory Visit

## 2020-10-01 NOTE — Telephone Encounter (Signed)
Mailed Power of attorney form and living will to pt, confirmed address

## 2020-10-04 ENCOUNTER — Ambulatory Visit: Payer: Medicare Other | Admitting: Internal Medicine

## 2020-10-08 ENCOUNTER — Telehealth: Payer: Self-pay | Admitting: Medical Oncology

## 2020-10-08 NOTE — Telephone Encounter (Signed)
Dtr [name], said she will talk to pt and get CT scan r/s .

## 2020-10-08 NOTE — Telephone Encounter (Addendum)
He needs transportation to labs and scan and f/u. Pt called to r/s is scan.

## 2020-10-11 ENCOUNTER — Inpatient Hospital Stay: Payer: Medicare Other

## 2020-10-13 ENCOUNTER — Telehealth: Payer: Self-pay | Admitting: Internal Medicine

## 2020-10-13 ENCOUNTER — Telehealth: Payer: Self-pay | Admitting: Medical Oncology

## 2020-10-13 ENCOUNTER — Ambulatory Visit (HOSPITAL_COMMUNITY): Payer: Medicare Other

## 2020-10-13 NOTE — Telephone Encounter (Signed)
LVM to r/s his CT scan and his f/u with [facility]. Expected date May 25th

## 2020-10-13 NOTE — Telephone Encounter (Signed)
R/s appts per 5/18 sch msg. Called pt, no answer. Left msg with appts dates and times.

## 2020-10-14 ENCOUNTER — Ambulatory Visit (INDEPENDENT_AMBULATORY_CARE_PROVIDER_SITE_OTHER): Payer: Medicare Other

## 2020-10-14 ENCOUNTER — Other Ambulatory Visit: Payer: Self-pay

## 2020-10-14 DIAGNOSIS — Z7901 Long term (current) use of anticoagulants: Secondary | ICD-10-CM

## 2020-10-14 LAB — POCT INR: INR: 1.7 — AB (ref 2.0–3.0)

## 2020-10-14 MED ORDER — WARFARIN SODIUM 5 MG PO TABS: ORAL_TABLET | ORAL | 1 refills | Status: DC

## 2020-10-14 NOTE — Progress Notes (Signed)
Pt here for INR check per  Goal INR = 2.0-3.0  Last INR = 4.2  Pt currently takes Coumadin 5 mg Monday -Thursday and 7 mg Friday-Sunday (Instructed to hold for 5 days at last appointment)  Date of last Coumadin dose = Today  Pt denies recent antibiotics, no dietary changes and no unusual bruising / bleeding.  INR today = 1.7  Pt advised per Baldo Ash [name]-NP to take another 5 mg today, then go back to his regular schedule of 7 mg Friday-Sunday and 5 mg Monday-Thursday.   Patient is to follow up in 2 weeks. Pt verbalized understanding

## 2020-10-18 ENCOUNTER — Inpatient Hospital Stay: Payer: Medicare Other | Admitting: Internal Medicine

## 2020-10-19 ENCOUNTER — Ambulatory Visit (HOSPITAL_COMMUNITY)
Admission: RE | Admit: 2020-10-19 | Discharge: 2020-10-19 | Disposition: A | Discharge status: Home / Self Care | Payer: Medicare Other | Source: Ambulatory Visit | Attending: Internal Medicine | Admitting: Internal Medicine

## 2020-10-19 ENCOUNTER — Other Ambulatory Visit: Payer: Self-pay

## 2020-10-19 DIAGNOSIS — C3490 Malignant neoplasm of unspecified part of unspecified bronchus or lung: Secondary | ICD-10-CM | POA: Insufficient documentation

## 2020-10-19 DIAGNOSIS — T797XXA Traumatic subcutaneous emphysema, initial encounter: Secondary | ICD-10-CM | POA: Diagnosis not present

## 2020-10-19 DIAGNOSIS — I2510 Atherosclerotic heart disease of native coronary artery without angina pectoris: Secondary | ICD-10-CM | POA: Diagnosis not present

## 2020-10-19 DIAGNOSIS — J90 Pleural effusion, not elsewhere classified: Secondary | ICD-10-CM | POA: Diagnosis not present

## 2020-10-20 ENCOUNTER — Inpatient Hospital Stay: Payer: Medicare Other | Attending: Physician Assistant

## 2020-10-20 DIAGNOSIS — C3490 Malignant neoplasm of unspecified part of unspecified bronchus or lung: Secondary | ICD-10-CM | POA: Diagnosis not present

## 2020-10-20 LAB — CBC WITH DIFFERENTIAL (CANCER CENTER ONLY)
Abs Immature Granulocytes: 0.02 10*3/uL (ref 0.00–0.07)
Basophils Absolute: 0.1 10*3/uL (ref 0.0–0.1)
Basophils Relative: 1 %
Eosinophils Absolute: 0.3 10*3/uL (ref 0.0–0.5)
Eosinophils Relative: 5 %
HCT: 45.6 % (ref 39.0–52.0)
Hemoglobin: 15.1 g/dL (ref 13.0–17.0)
Immature Granulocytes: 0 %
Lymphocytes Relative: 27 %
Lymphs Abs: 1.7 10*3/uL (ref 0.7–4.0)
MCH: 29.7 pg (ref 26.0–34.0)
MCHC: 33.1 g/dL (ref 30.0–36.0)
MCV: 89.6 fL (ref 80.0–100.0)
Monocytes Absolute: 0.4 10*3/uL (ref 0.1–1.0)
Monocytes Relative: 7 %
Neutro Abs: 3.6 10*3/uL (ref 1.7–7.7)
Neutrophils Relative %: 60 %
Platelet Count: 295 10*3/uL (ref 150–400)
RBC: 5.09 MIL/uL (ref 4.22–5.81)
RDW: 14.9 % (ref 11.5–15.5)
WBC Count: 6.1 10*3/uL (ref 4.0–10.5)
nRBC: 0 % (ref 0.0–0.2)

## 2020-10-20 LAB — CMP (CANCER CENTER ONLY)
ALT: 25 U/L (ref 0–44)
AST: 22 U/L (ref 15–41)
Albumin: 3.6 g/dL (ref 3.5–5.0)
Alkaline Phosphatase: 76 U/L (ref 38–126)
Anion gap: 7 (ref 5–15)
BUN: 15 mg/dL (ref 8–23)
CO2: 31 mmol/L (ref 22–32)
Calcium: 9.6 mg/dL (ref 8.9–10.3)
Chloride: 105 mmol/L (ref 98–111)
Creatinine: 0.95 mg/dL (ref 0.61–1.24)
GFR, Estimated: >60 mL/min (ref >60)
Glucose, Bld: 85 mg/dL (ref 70–99)
Potassium: 4.1 mmol/L (ref 3.5–5.1)
Sodium: 143 mmol/L (ref 135–145)
Total Bilirubin: 0.4 mg/dL (ref 0.3–1.2)
Total Protein: 7.1 g/dL (ref 6.5–8.1)

## 2020-10-27 ENCOUNTER — Inpatient Hospital Stay: Payer: Medicare Other | Admitting: Physician Assistant

## 2020-10-27 ENCOUNTER — Telehealth: Payer: Self-pay

## 2020-10-27 NOTE — Telephone Encounter (Signed)
I called pt regarding his missed appt today 10/27/20. Pt states no one ever came to pick him up and he is supposed to have an arranged ride.  I have also called pts daughter, [name], to advise of this. I have also given her the following information: Transportation is through [facility]- call [phone] for a new reservation. To request a ride -(3 days notice -).  Ms. [name] expressed understanding of this information and has written this information down. She is aware she will be contacted by the scheduling department for Mr. Fujita to be r/s and will set up his transportation.

## 2020-10-28 ENCOUNTER — Other Ambulatory Visit: Payer: Self-pay

## 2020-10-28 ENCOUNTER — Ambulatory Visit (INDEPENDENT_AMBULATORY_CARE_PROVIDER_SITE_OTHER): Payer: Medicare Other

## 2020-10-28 ENCOUNTER — Ambulatory Visit: Payer: Medicare Other | Admitting: Nurse Practitioner

## 2020-10-28 ENCOUNTER — Other Ambulatory Visit: Payer: Self-pay | Admitting: Nurse Practitioner

## 2020-10-28 VITALS — Temp 98.4°F | Ht 73.0 in | Wt 135.4 lb

## 2020-10-28 DIAGNOSIS — I6601 Occlusion and stenosis of right middle cerebral artery: Secondary | ICD-10-CM | POA: Diagnosis not present

## 2020-10-28 DIAGNOSIS — Z7901 Long term (current) use of anticoagulants: Secondary | ICD-10-CM

## 2020-10-28 MED ORDER — WARFARIN SODIUM 5 MG PO TABS
ORAL_TABLET | ORAL | 0 refills | Status: DC
Start: 2020-10-28 — End: 2020-11-02

## 2020-10-28 NOTE — Progress Notes (Signed)
INR of 1.7 today. He is to take 15mg  Fri-Sun, then resume previous dosing as indicated on his prescription. He was provided printed information on recommended diet while on coumadin. F/up in 2weeks for repeat INR

## 2020-10-28 NOTE — Progress Notes (Signed)
Pt here for INR check per  Goal INR = 2.0-3.0  Last INR = 1.7  Pt currently takes Coumadin 5 mg Monday -Thursday and 7.5 mg Friday-Sunday   Date of last Coumadin dose = Today  Pt denies recent antibiotics, no dietary changes and no unusual bruising / bleeding.  INR today = 1.7  Pt advised per Baldo Ash [name]-NP to take 15 mg for the next 3 days, then go back to his regular schedule of 7.5 mg Friday-Sunday and 5 mg Monday-Thursday.   Patient is to follow up in 2 weeks. Pt verbalized understanding.  appointment scheduled 11/11/20.

## 2020-11-01 ENCOUNTER — Telehealth: Payer: Self-pay | Admitting: Nurse Practitioner

## 2020-11-01 ENCOUNTER — Telehealth: Payer: Self-pay | Admitting: Hospice

## 2020-11-01 DIAGNOSIS — Z515 Encounter for palliative care: Secondary | ICD-10-CM

## 2020-11-01 DIAGNOSIS — Z7901 Long term (current) use of anticoagulants: Secondary | ICD-10-CM

## 2020-11-01 NOTE — Telephone Encounter (Signed)
Marnette Burgess w/[facility] is needing updated RX as pt current RX is flagged as refill requested too soon as they do not have the 10/28/20 update to 15mg  Fri-Sun in their record to dispense.

## 2020-11-01 NOTE — Telephone Encounter (Signed)
NP called to schedule a visit with patient. He said he did not need visits because he gets bills from [facility] in the mail. Education provided that [facility] is not for profit and does not go to collection; he should disregard it if he is not able to pay. He verbalized understanding and said he would call when he needs a visit.

## 2020-11-02 MED ORDER — WARFARIN SODIUM 5 MG PO TABS: ORAL_TABLET | ORAL | 0 refills | Status: DC

## 2020-11-05 ENCOUNTER — Other Ambulatory Visit: Payer: Self-pay

## 2020-11-05 ENCOUNTER — Other Ambulatory Visit: Payer: Medicare Other | Admitting: *Deleted

## 2020-11-05 DIAGNOSIS — E782 Mixed hyperlipidemia: Secondary | ICD-10-CM

## 2020-11-05 LAB — ALT: ALT: 16 IU/L (ref 0–44)

## 2020-11-05 LAB — LIPID PANEL
Chol/HDL Ratio: 2.4 ratio (ref 0.0–5.0)
Cholesterol, Total: 138 mg/dL (ref 100–199)
HDL: 58 mg/dL (ref >39)
LDL Chol Calc (NIH): 70 mg/dL (ref 0–99)
Triglycerides: 42 mg/dL (ref 0–149)
VLDL Cholesterol Cal: 10 mg/dL (ref 5–40)

## 2020-11-08 ENCOUNTER — Other Ambulatory Visit: Payer: Self-pay | Admitting: *Deleted

## 2020-11-08 DIAGNOSIS — E782 Mixed hyperlipidemia: Secondary | ICD-10-CM

## 2020-11-08 NOTE — Progress Notes (Signed)
[facility] OFFICE PROGRESS NOTE  [name], [name], NP [place] [address]  DIAGNOSIS: Stage IIIA non-small cell lung cancer, adenocarcinoma   PRIOR THERAPY: Status post a course of concurrent chemoradiation with weekly carboplatin and paclitaxel completed in early 2018.  The patient has been in observation since that time.  CURRENT THERAPY: Observation  INTERVAL HISTORY: Jared Stevens 70 y.o. male returns to the clinic today for a follow-up visit.  The patient is feeling well today without any concerning complaints.  He has been on observation since 2018 feeling well. He walks at least 1 mile per day.  He denies any fever, chills, night sweats, or weight loss.  She denies any chest pain, hemoptysis, or shortness of breath with exertion. He sometimes has a very mild cough intermittently if he has congestion. He denies any nausea, vomiting, diarrhea, or constipation.  She denies any headache or visual changes.  He denies any rashes or skin changes.  The patient recently had a restaging CT scan performed.  The patient is here today for evaluation and to review his scan results.   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  MEDICATIONS:  Current  Outpatient Medications  Medication Sig Dispense Refill   amitriptyline (ELAVIL) 50 MG tablet Take 50 mg by mouth at bedtime as needed.      carvedilol (COREG) 3.125 MG tablet Take 1 tablet (3.125 mg total) by mouth 2 (two) times daily. 180 tablet 1   Cyanocobalamin (B-12) 2500 MCG SUBL Place under the tongue.     digoxin (LANOXIN) 0.125 MG tablet Take 1 tablet (0.125 mg total) by mouth daily. Prescribed by Dustin Folks, NP on 10/20/19 90 tablet 3   furosemide (LASIX) 40 MG tablet Take 1 tablet (40 mg total) by mouth daily. 30 tablet 2   latanoprost (XALATAN) 0.005 % ophthalmic solution Place 1 drop into both eyes daily.  11   rosuvastatin (CRESTOR) 40 MG tablet Take 1 tablet (40 mg total) by mouth daily. 90 tablet 3   spironolactone (ALDACTONE) 25 MG tablet Take 0.5 tablets (12.5 mg total) by mouth every other day. Changed by Dustin Folks NP on 08/09/19 30 tablet 0   SYMBICORT 160-4.5 MCG/ACT inhaler INL 2 PFS PO BID IN THE MORNING AND IN THE EVE 1 each 0   warfarin (COUMADIN) 5 MG tablet 5mg  Mon-Thurs and 7.5mg  Fri-Sat 35 tablet 0   No current facility-administered medications for this visit.    SURGICAL HISTORY:  Past Surgical History:  Procedure Laterality Date   Arm Surgery     HERNIA REPAIR     IR ANGIO INTRA EXTRACRAN SEL COM CAROTID INNOMINATE UNI L  MOD SED  10/21/2018   IR ANGIO VERTEBRAL SEL SUBCLAVIAN INNOMINATE UNI R MOD SED  10/21/2018   IR CT HEAD LTD  10/21/2018   IR PERCUTANEOUS ART THROMBECTOMY/INFUSION INTRACRANIAL INC DIAG ANGIO  10/21/2018   IR THORACENTESIS ASP PLEURAL SPACE W/IMG GUIDE  09/27/2018   RADIOLOGY WITH ANESTHESIA N/A 10/21/2018   Procedure: RADIOLOGY WITH ANESTHESIA;  Surgeon: Luanne Bras, MD;  Location: [facility];  Service: Radiology;  Laterality: N/A;   REPLANTATION THUMB     RIGHT/LEFT HEART CATH AND CORONARY ANGIOGRAPHY N/A 10/01/2018   Procedure: RIGHT/LEFT HEART CATH AND CORONARY ANGIOGRAPHY;  Surgeon: Troy Sine, MD;  Location: [facility];   Service: Cardiovascular;  Laterality: N/A;    REVIEW OF SYSTEMS:   Review of Systems  Constitutional: Negative for appetite change, chills, fatigue, fever and unexpected weight change.  HENT: Negative for mouth sores, nosebleeds, sore throat and trouble swallowing.   Eyes: Negative for eye problems and icterus.  Respiratory: Positive for mild occasional cough. Negative for hemoptysis, shortness of breath and wheezing.   Cardiovascular: Negative for chest pain and leg swelling.  Gastrointestinal: Negative for abdominal pain, constipation, diarrhea, nausea and vomiting.  Genitourinary: Negative for bladder incontinence, difficulty urinating, dysuria, frequency and hematuria.   Musculoskeletal: Negative for back pain, gait problem, neck pain and neck stiffness.  Skin: Negative for itching and rash.  Neurological: Negative for dizziness, extremity weakness, gait problem, headaches, light-headedness and seizures.  Hematological: Negative for adenopathy. Does not bruise/bleed easily.  Psychiatric/Behavioral: Negative for confusion, depression and sleep disturbance. The patient is not nervous/anxious.     PHYSICAL EXAMINATION:  Blood pressure 135/82, pulse 82, temperature 98 F (36.7 C), temperature source Tympanic, resp. rate 18, height 6\' 1"  (1.854 m), weight 140 lb 4.8 oz (63.6 kg), SpO2 100 %.  ECOG PERFORMANCE STATUS: 1  Physical Exam  Constitutional: Oriented to person, place, and time and well-developed, well-nourished, and in no distress.  HENT:  Head: Normocephalic and atraumatic.  Mouth/Throat: Oropharynx is clear and moist. No oropharyngeal exudate.  Eyes: Conjunctivae are normal. Right eye exhibits no discharge. Left eye exhibits no discharge. No scleral icterus.  Neck: Normal range of motion. Neck supple.  Cardiovascular: Normal rate, regular rhythm, normal heart sounds and intact distal pulses.   Pulmonary/Chest: Effort normal and breath sounds normal. No respiratory distress.  No wheezes. No rales.  Abdominal: Soft. Bowel sounds are normal. Exhibits no distension and no mass. There is no tenderness.  Musculoskeletal: Normal range of motion. Exhibits no edema.  Lymphadenopathy:    No cervical adenopathy.  Neurological: Alert and oriented to person, place, and time. Exhibits normal muscle tone. Gait normal. Coordination normal.  Skin: Skin is warm and dry. No rash noted. Not diaphoretic. No erythema. No pallor.  Psychiatric: Mood, memory and judgment normal.  Vitals reviewed.  LABORATORY DATA: Lab Results  Component Value Date   WBC 6.1 10/20/2020   HGB 15.1 10/20/2020   HCT 45.6 10/20/2020   MCV 89.6 10/20/2020   PLT 295 10/20/2020      Chemistry      Component Value Date/Time   NA 143 10/20/2020 0843   NA 141 08/03/2020 0947   K 4.1 10/20/2020 0843   CL 105 10/20/2020 0843   CO2 31 10/20/2020 0843   BUN 15 10/20/2020 0843   BUN 17 08/03/2020 0947   CREATININE 0.95 10/20/2020 0843      Component Value Date/Time   CALCIUM 9.6 10/20/2020 0843   ALKPHOS 76 10/20/2020 0843  AST 22 10/20/2020 0843   ALT 16 11/05/2020 0734   ALT 25 10/20/2020 0843   BILITOT 0.4 10/20/2020 0843       RADIOGRAPHIC STUDIES:  CT Chest Wo Contrast  Result Date: 10/20/2020 CLINICAL DATA:  Primary Cancer Type: Lung Imaging Indication: Routine surveillance Interval therapy since last imaging? No Initial Cancer Diagnosis Date: 2017 Detailed Pathology: Stage IIIA non-small cell lung cancer, adenocarcinoma. Primary Tumor location: Right lung. Surgeries: No. Chemotherapy: Yes; Ongoing?  No; Most recent administration: 2018 Immunotherapy? No Radiation therapy?  Yes; Date Range: 2017; Target: Right lung EXAM: CT CHEST WITHOUT CONTRAST TECHNIQUE: Multidetector CT imaging of the chest was performed following the standard protocol without IV contrast. COMPARISON:  Most recent CT chest 02/12/2020.  05/23/2018 PET-CT. FINDINGS: Cardiovascular: The heart size is normal. Aortic  atherosclerosis. Coronary artery atherosclerotic calcifications noted. Mediastinum/Nodes: No discrete thyroid nodule. The trachea appears patent and is midline. Normal appearance of the esophagus. No adenopathy identified. Lungs/Pleura: Paraseptal and centrilobular emphysema. Small right pleural effusion appears decreased in volume from previous exam. Right lung paramediastinal and perihilar fibrosis is again noted. Treated tumor within the perihilar right lung is again noted and appears similar to the previous exam compatible with treated tumor. This is stable measuring 4.6 x 2.7 cm, image 108/2. No signs of residual or recurrent tumor. Faint ground-glass nodule in the periphery of the left upper lobe measures 3 mm, image 90/7. Upper Abdomen: No acute abnormality. Musculoskeletal: No chest wall mass or suspicious bone lesions identified. IMPRESSION: 1. Stable appearance of treated tumor within the perihilar right lung. No signs of residual or recurrent tumor. 2. Decrease in volume of right pleural effusion. 3. Coronary artery atherosclerotic calcifications. Aortic Atherosclerosis (ICD10-I70.0) and Emphysema (ICD10-J43.9). Electronically Signed   By: Kerby Moors M.D.   On: 10/20/2020 10:51     ASSESSMENT/PLAN:  This is a very pleasant 70 year old African-American male with a history of stage Ia non-small cell lung cancer, adenocarcinoma.  The patient underwent a course of concurrent chemoradiation in [place] in 2017.  The patient has been on observation and feeling well.  The patient recently had a restaging CT scan performed.  Dr. Julien Nordmann personally and independently reviewed the scan discussed results with the patient.  The scan showed no evidence for disease progression. Dr. Julien Nordmann recommended that the patient continue on observation with a restaging CT scan the chest in 1 year.   We will see him back for follow-up visit at that time.  He typically needs transportation assistance for his  appointments. We advised him to call about 1 week or so before his appointment for transportation assistance, if needed.   The patient was advised to call immediately if she has any concerning symptoms in the interval. The patient voices understanding of current disease status and treatment options and is in agreement with the current care plan. All questions were answered. The patient knows to call the clinic with any problems, questions or concerns. We can certainly see the patient much sooner if necessary    Orders Placed This Encounter  Procedures   CT Chest W Contrast    Standing Status:   Future    Standing Expiration Date:   11/09/2021    Order Specific Question:   If indicated for the ordered procedure, I authorize the administration of contrast media per Radiology protocol    Answer:   Yes    Order Specific Question:   Preferred imaging location?    Answer:   [facility]  CBC with Differential (Cancer Center Only)    Standing Status:   Future    Standing Expiration Date:   11/09/2021   CMP ([facility] only)    Standing Status:   Future    Standing Expiration Date:   11/09/2021     Tobe Sos Breeze Berringer, PA-C 11/09/20  ADDENDUM: Hematology/Oncology Attending: I had a face-to-face encounter with the patient today.  I reviewed his lab, scan and recommended his care plan.  This is a very pleasant 70 years old African-American male with history of history of IIIA non-small cell lung cancer,adenocarcinoma status post a course of concurrent chemoradiation in [place] in 2017 and the patient has been in observation since that time.   For more than a year. The patient came to the clinic today for evaluation with repeat CT scan of the chest for restaging of his disease.  I personally and independently reviewed the scan and discussed the results with the patient. His scan showed no concerning findings for disease progression. I recommended for him to continue on  observation with repeat CT scan of the chest in 1 year. He was advised to call immediately if he has any other concerning symptoms in the interval. Disclaimer: This note was dictated with voice recognition software. Similar sounding words can inadvertently be transcribed and may be missed upon review.  Eilleen Kempf, MD 11/10/20

## 2020-11-09 ENCOUNTER — Inpatient Hospital Stay: Payer: Medicare Other | Attending: Internal Medicine | Admitting: Physician Assistant

## 2020-11-09 ENCOUNTER — Telehealth: Payer: Self-pay | Admitting: Internal Medicine

## 2020-11-09 ENCOUNTER — Other Ambulatory Visit: Payer: Self-pay

## 2020-11-09 VITALS — BP 135/82 | HR 82 | Temp 98.0°F | Resp 18 | Ht 73.0 in | Wt 140.3 lb

## 2020-11-09 DIAGNOSIS — Z85118 Personal history of other malignant neoplasm of bronchus and lung: Secondary | ICD-10-CM | POA: Diagnosis not present

## 2020-11-09 DIAGNOSIS — Z9221 Personal history of antineoplastic chemotherapy: Secondary | ICD-10-CM | POA: Diagnosis not present

## 2020-11-09 DIAGNOSIS — Z8673 Personal history of transient ischemic attack (TIA), and cerebral infarction without residual deficits: Secondary | ICD-10-CM | POA: Diagnosis not present

## 2020-11-09 DIAGNOSIS — I2510 Atherosclerotic heart disease of native coronary artery without angina pectoris: Secondary | ICD-10-CM | POA: Insufficient documentation

## 2020-11-09 DIAGNOSIS — C3491 Malignant neoplasm of unspecified part of right bronchus or lung: Secondary | ICD-10-CM | POA: Diagnosis not present

## 2020-11-09 DIAGNOSIS — Z7951 Long term (current) use of inhaled steroids: Secondary | ICD-10-CM | POA: Insufficient documentation

## 2020-11-09 DIAGNOSIS — E7800 Pure hypercholesterolemia, unspecified: Secondary | ICD-10-CM | POA: Diagnosis not present

## 2020-11-09 DIAGNOSIS — Z923 Personal history of irradiation: Secondary | ICD-10-CM | POA: Diagnosis not present

## 2020-11-09 DIAGNOSIS — I5042 Chronic combined systolic (congestive) and diastolic (congestive) heart failure: Secondary | ICD-10-CM | POA: Insufficient documentation

## 2020-11-09 DIAGNOSIS — Z79899 Other long term (current) drug therapy: Secondary | ICD-10-CM | POA: Insufficient documentation

## 2020-11-09 NOTE — Patient Instructions (Signed)
-  Your scan looks stable. No evidence of recurrent disease.  -We will see you in 1 year for labs and a CT scan. Call us a few days before your appointment to help you arrange for transportation. Our number is [phone] -We will see you a few days later to review the scan results with you.

## 2020-11-09 NOTE — Telephone Encounter (Signed)
Scheduled per los. Gave avs and calendar  

## 2020-11-11 ENCOUNTER — Other Ambulatory Visit: Payer: Self-pay

## 2020-11-11 ENCOUNTER — Ambulatory Visit (INDEPENDENT_AMBULATORY_CARE_PROVIDER_SITE_OTHER): Payer: Medicare Other

## 2020-11-11 DIAGNOSIS — Z7901 Long term (current) use of anticoagulants: Secondary | ICD-10-CM | POA: Diagnosis not present

## 2020-11-11 LAB — POCT INR: INR: 3.5 — AB (ref 2.0–3.0)

## 2020-11-11 NOTE — Progress Notes (Signed)
Pt here for INR check per Baldo Ash [name]  Goal INR = 2.0-3.0  Last INR = 1.7  Pt currently takes Coumadin 5 mg Mon.-Thurs. 7.5 mg Fri.-Sun.  Date of last Coumadin dose = 11/11/20  Pt denies recent antibiotics, no dietary changes and no unusual bruising / bleeding.  INR today = 3.5  Pt advised per  Wilfred Lacy,  Stop Friday dose and resume regular dose starting Saturday.   5 mg on Mon.-Thurs.  7.5 mg Fri.-Sun.  Pt notified and verbalized understanding.

## 2020-12-03 ENCOUNTER — Telehealth: Payer: Self-pay

## 2020-12-03 NOTE — Telephone Encounter (Signed)
Rainya @ [facility] called needing clarification on prescription for Warfarin.  In April directions were; 1 1/2 tabs every Fri,Sat,Sun  May - 1 tab M-Thur, 1 1/2 Fri - Sun  The last one sent is 1 tab M - Th   1 1/2 Fri - Sat.  Was Sunday forgotten or is he suppose to skip Sunday?

## 2020-12-03 NOTE — Telephone Encounter (Signed)
Spoke with pharmacy and clarified medication.

## 2020-12-09 ENCOUNTER — Ambulatory Visit (INDEPENDENT_AMBULATORY_CARE_PROVIDER_SITE_OTHER): Payer: Medicare Other

## 2020-12-09 ENCOUNTER — Other Ambulatory Visit: Payer: Self-pay

## 2020-12-09 DIAGNOSIS — Z7901 Long term (current) use of anticoagulants: Secondary | ICD-10-CM | POA: Diagnosis not present

## 2020-12-09 LAB — POCT INR: INR: 2 (ref 2.0–3.0)

## 2020-12-09 NOTE — Progress Notes (Signed)
Pt here for INR check per Baldo Ash [name]-NP  Goal INR = 2.0-3.0  Last INR = 3.5  Pt currently takes Coumadin 5 mg Mon.-Thurs. 7.5 mg Fri.-Sun.  Date of last Coumadin dose = 12/09/20  Pt denies recent antibiotics, no dietary changes and no unusual bruising / bleeding.  INR today = 2.0  Pt advised per Dr. Bryan Lemma to continue current dose of medication (5 mg Mon.-Thurs. 7.5 mg Fri.-Sun.) Pt verbalized understanding and instructed to schedule follow up for I month.

## 2020-12-10 ENCOUNTER — Other Ambulatory Visit: Payer: Self-pay | Admitting: Nurse Practitioner

## 2020-12-10 DIAGNOSIS — J449 Chronic obstructive pulmonary disease, unspecified: Secondary | ICD-10-CM

## 2020-12-10 NOTE — Telephone Encounter (Signed)
Chart supports Rx

## 2020-12-13 DIAGNOSIS — R3914 Feeling of incomplete bladder emptying: Secondary | ICD-10-CM | POA: Diagnosis not present

## 2020-12-29 ENCOUNTER — Other Ambulatory Visit: Payer: Self-pay | Admitting: Nurse Practitioner

## 2020-12-29 DIAGNOSIS — Z7901 Long term (current) use of anticoagulants: Secondary | ICD-10-CM

## 2020-12-30 NOTE — Telephone Encounter (Signed)
Chart supports rx refill Last ov 08/05/2020 Last refill 12/03/2020

## 2021-01-19 ENCOUNTER — Ambulatory Visit (INDEPENDENT_AMBULATORY_CARE_PROVIDER_SITE_OTHER): Payer: Medicare Other | Admitting: Nurse Practitioner

## 2021-01-19 ENCOUNTER — Other Ambulatory Visit: Payer: Self-pay

## 2021-01-19 ENCOUNTER — Encounter: Payer: Self-pay | Admitting: Nurse Practitioner

## 2021-01-19 VITALS — BP 100/78 | HR 86 | Temp 97.3°F | Ht 73.0 in | Wt 136.0 lb

## 2021-01-19 DIAGNOSIS — Z7901 Long term (current) use of anticoagulants: Secondary | ICD-10-CM

## 2021-01-19 DIAGNOSIS — E782 Mixed hyperlipidemia: Secondary | ICD-10-CM

## 2021-01-19 DIAGNOSIS — R636 Underweight: Secondary | ICD-10-CM | POA: Diagnosis not present

## 2021-01-19 DIAGNOSIS — N400 Enlarged prostate without lower urinary tract symptoms: Secondary | ICD-10-CM

## 2021-01-19 DIAGNOSIS — K4090 Unilateral inguinal hernia, without obstruction or gangrene, not specified as recurrent: Secondary | ICD-10-CM | POA: Diagnosis not present

## 2021-01-19 DIAGNOSIS — I5042 Chronic combined systolic (congestive) and diastolic (congestive) heart failure: Secondary | ICD-10-CM

## 2021-01-19 LAB — POCT INR: INR: 2.7 (ref 2.0–3.0)

## 2021-01-19 LAB — BASIC METABOLIC PANEL
BUN: 18 mg/dL (ref 6–23)
CO2: 30 mEq/L (ref 19–32)
Calcium: 9.7 mg/dL (ref 8.4–10.5)
Chloride: 103 mEq/L (ref 96–112)
Creatinine, Ser: 0.94 mg/dL (ref 0.40–1.50)
GFR: 82.54 mL/min (ref >60.00)
Glucose, Bld: 78 mg/dL (ref 70–99)
Potassium: 4.4 mEq/L (ref 3.5–5.1)
Sodium: 140 mEq/L (ref 135–145)

## 2021-01-19 LAB — PSA, MEDICARE: PSA: 13.14 ng/ml — ABNORMAL HIGH (ref 0.10–4.00)

## 2021-01-19 MED ORDER — CARVEDILOL 3.125 MG PO TABS: 3.1250 mg | ORAL_TABLET | Freq: Two times a day (BID) | ORAL | 3 refills | Status: DC

## 2021-01-19 MED ORDER — SPIRONOLACTONE 25 MG PO TABS: 12.5000 mg | ORAL_TABLET | ORAL | 1 refills | Status: DC

## 2021-01-19 MED ORDER — DIGOXIN 125 MCG PO TABS: 0.1250 mg | ORAL_TABLET | Freq: Every day | ORAL | 3 refills | Status: DC

## 2021-01-19 MED ORDER — WARFARIN SODIUM 5 MG PO TABS
ORAL_TABLET | ORAL | 5 refills | Status: DC
Start: 2021-01-19 — End: 2021-07-21

## 2021-01-19 NOTE — Patient Instructions (Addendum)
Finesteride and flomax was prescribed by your urologist. Please inform him about side effects you are experiencing (nausea).  Go to lab for blood draw  Maintain coumadin dose Schedule nurse visit for INR check in 60month.

## 2021-01-19 NOTE — Assessment & Plan Note (Signed)
Chronic, soft and reducible. No pain with bowel movement or urination.

## 2021-01-19 NOTE — Assessment & Plan Note (Addendum)
INR is therapeutic at 2.7 today Denies any GI/GU bleed or nosebleed or bruising. Maintain current dose, refill sent F/up in 72month

## 2021-01-19 NOTE — Progress Notes (Signed)
Subjective:    Patient ID: Jared Stevens, male    DOB: Sep 28, 1950, 70 y.o.   MRN: [id number]  Patient presents today for CPE and eval of chronic conditions  Heath care team: Cardiology: Dr. Ladene Artist, last appt 08/03/2020 Oncology: Dr. Earlie Server, last OV 11/09/2020 Urology: Dr. Gloriann Loan, unknown  HPI Long term current use of anticoagulant therapy INR is therapeutic at 2.7 today Denies any GI/GU bleed or nosebleed or bruising. Maintain current dose, refill sent F/up in 24month  Benign prostatic hyperplasia without lower urinary tract symptoms Followed by Dr. Gloriann Loan with [facility]. Current use of flomax and finesteride Repeat PSA today is at 13 Denies any LUTs or hematuria.  Advised to schedule f/up with Dr. Gloriann Loan  Chronic combined systolic and diastolic CHF (congestive heart failure) (HCC) HFrEF 20-25% Followed by Cardiology: Dr. Ladene Artist, last appt 08/03/2020 Stable volume today: No edema, no cough or SOB or CP or syncope or palpitations. BP Readings from Last 3 Encounters:  01/19/21 100/78  11/09/20 135/82  08/05/20 114/72   Wt Readings from Last 3 Encounters:  01/19/21 136 lb (61.7 kg)  11/09/20 140 lb 4.8 oz (63.6 kg)  10/28/20 135 lb 6.4 oz (61.4 kg)   Continue coreg, digoxin, and spironolactone  Inguinal hernia of left side without obstruction or gangrene Chronic, soft and reducible. No pain with bowel movement or urination.  Vision:completed within last 1year per patient Dental:no insurance Diet:low sodium and avoiding dark leafy greens Exercise:none Weight:  Wt Readings from Last 3 Encounters:  01/19/21 136 lb (61.7 kg)  11/09/20 140 lb 4.8 oz (63.6 kg)  10/28/20 135 lb 6.4 oz (61.4 kg)    Depression/Suicide: Depression screen [facility] 2/9 01/19/2021 03/09/2020 01/01/2020 05/06/2018  Decreased Interest 3 0 0 0  Down, Depressed, Hopeless 0 0 0 0  PHQ - 2 Score 3 0 0 0  Altered sleeping 2 - - -  Tired, decreased energy 0 - - -   Change in appetite 0 - - -  Feeling bad or failure about yourself  0 - - -  Trouble concentrating 3 - - -  Moving slowly or fidgety/restless 0 - - -  Suicidal thoughts 0 - - -  PHQ-9 Score 8 - - -  Difficult doing work/chores Not difficult at all - - -   Immunizations: (TDAP, Hep C screen, Pneumovax, Influenza, zoster)  Health Maintenance  Topic Date Due   Hepatitis C Screening: USPSTF Recommendation to screen - Ages 88-79 yo.  Never done   Tetanus Vaccine  Never done   Zoster (Shingles) Vaccine (1 of 2) Never done   Colon Cancer Screening  Never done   Pneumonia vaccines (1 of 2 - PCV13) Never done   COVID-19 Vaccine (4 - Booster for Moderna series) 10/30/2020   Flu Shot  12/27/2020   HPV Vaccine  Aged Out   Fall Risk: Fall Risk  03/09/2020 01/01/2020 05/06/2018  Falls in the past year? 1 1 1   Comment - - tripped on the curb  Number falls in past yr: 1 1 0  Injury with Fall? 0 1 1  Comment - - broke his collar bone, hands and chest now numb  Risk for fall due to : History of fall(s) - -  Follow up Falls prevention discussed - -   Advanced Directive: Advanced Directives 07/31/2020  Does Patient Have a Medical Advance Directive? No  Type of Advance Directive -  Does patient want to make changes to medical advance directive? -  Copy of [place] in Chart? -  Would patient like information on creating a medical advance directive? No - Patient declined    Medications and allergies reviewed with patient and updated if appropriate.  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   Middle cerebral artery embolism, right 10/21/2018   Recurrent right pleural effusion    History of lung cancer 09/27/2018   Acute on chronic respiratory failure with hypoxia (HCC) 09/27/2018   Elevated brain natriuretic peptide (BNP) level 09/27/2018   COPD (chronic obstructive pulmonary disease) ([place]) 09/27/2018   Hyperlipidemia 09/27/2018   Tobacco use disorder, continuous 09/27/2018   Non-small cell lung cancer, right ([place]) 08/26/2018    Current Outpatient Medications on File Prior to Visit  Medication Sig Dispense Refill   amitriptyline (ELAVIL) 50 MG tablet Take 50 mg by mouth at bedtime as needed.      Cyanocobalamin (B-12) 2500 MCG SUBL Place under the tongue.     finasteride (PROSCAR) 5 MG tablet Take 5 mg by mouth daily.     latanoprost (XALATAN) 0.005 % ophthalmic solution Place 1 drop into both eyes daily.  11   SYMBICORT 160-4.5 MCG/ACT inhaler INL 2 PFS PO BID IN THE MORNING AND IN THE EVE 1 each 0   tamsulosin (FLOMAX) 0.4 MG CAPS capsule Take 0.4 mg by mouth at bedtime.     rosuvastatin (CRESTOR) 40 MG tablet Take 1 tablet (40 mg total) by mouth daily. 90 tablet 3   No current facility-administered medications on file prior to visit.   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   Tx with tPA and thrombectomy // Coumadin (managed by PCP)   Hx of recurrent R pleural effusion    s/p thoracentesis    Hypercholesteremia    Long term (current) use of anticoagulants 01/08/2020   Lung cancer ([place]) 03/2016   Mixed Ischemic and Non-Ischemic Cardiomyopathy    Non-small cell carcinoma of lung    Nonsustained ventricular tachycardia     Past Surgical History:  Procedure Laterality Date   Arm Surgery     HERNIA REPAIR     IR ANGIO INTRA EXTRACRAN SEL COM CAROTID INNOMINATE UNI L MOD SED  10/21/2018   IR ANGIO VERTEBRAL SEL SUBCLAVIAN INNOMINATE UNI R MOD SED  10/21/2018   IR CT HEAD LTD  10/21/2018   IR PERCUTANEOUS ART THROMBECTOMY/INFUSION INTRACRANIAL INC DIAG ANGIO  10/21/2018   IR THORACENTESIS ASP PLEURAL SPACE W/IMG GUIDE  09/27/2018   RADIOLOGY WITH ANESTHESIA  N/A 10/21/2018   Procedure: RADIOLOGY WITH ANESTHESIA;  Surgeon: Luanne Bras, MD;  Location: [facility];  Service: Radiology;  Laterality: N/A;   REPLANTATION THUMB     RIGHT/LEFT HEART CATH AND CORONARY ANGIOGRAPHY N/A 10/01/2018   Procedure: RIGHT/LEFT HEART CATH AND CORONARY ANGIOGRAPHY;  Surgeon: Troy Sine, MD;  Location: [facility];  Service: Cardiovascular;  Laterality: N/A;    Social History   Socioeconomic History   Marital status: Widowed    Spouse name: Not on file   Number of children: Not on file   Years of education: Not on file   Highest education level: Not on file  Occupational History   Not on file  Tobacco Use   Smoking status: Some Days   Smokeless tobacco: Never  Vaping Use   Vaping Use: Never used  Substance and Sexual Activity   Alcohol use: Yes    Alcohol/week: 2.0 standard drinks    Types: 2 Cans of beer per week    Comment: 1-2 beers weekly   Drug use: Not Currently   Sexual activity: Not on file  Other Topics Concern   Not on file  Social History Narrative   Not on file   Social Determinants of Health   Financial Resource Strain: Low Risk    Difficulty of Paying Living Expenses: Not hard at all  Food Insecurity: No Food Insecurity   Worried About Charity fundraiser in  the Last Year: Never true   [place] in the Last Year: Never true  Transportation Needs: No Transportation Needs   Lack of Transportation (Medical): No   Lack of Transportation (Non-Medical): No  Physical Activity: Sufficiently Active   Days of Exercise per Week: 7 days   Minutes of Exercise per Session: 40 min  Stress: No Stress Concern Present   Feeling of Stress : Not at all  Social Connections: Socially Isolated   Frequency of Communication with Friends and Family: More than three times a week   Frequency of Social Gatherings with Friends and Family: More than three times a week   Attends Religious Services: Never   Marine scientist or Organizations: No   Attends Archivist Meetings: Never   Marital Status: Widowed    Family History  Problem Relation Age of Onset   Aneurysm Mother        ROS  Objective:   Vitals:   01/19/21 0817  BP: 100/78  Pulse: 86  Temp: (!) 97.3 F (36.3 C)  SpO2: 97%    Body mass index is 17.94 kg/m.   Physical Examination:  Physical Exam Vitals reviewed. Exam conducted with a chaperone present.  Cardiovascular:     Rate and Rhythm: Normal rate and regular rhythm.     Pulses: Normal pulses.     Heart sounds: Normal heart sounds.  Pulmonary:     Effort: Pulmonary effort is normal.     Breath sounds: Normal breath sounds.  Abdominal:     General: Bowel sounds are normal.     Palpations: Abdomen is soft.     Hernia: A hernia is present. Hernia is present in the left inguinal area. There is no hernia in the right inguinal area.  Musculoskeletal:     Cervical back: Normal range of motion and neck supple.     Right lower leg: No edema.     Left lower leg: No edema.  Lymphadenopathy:     Cervical: No cervical adenopathy.  Lower Body: No right inguinal adenopathy. No left inguinal adenopathy.  Neurological:     Mental Status: He is alert and oriented to person, place, and time.  Psychiatric:        Mood  and Affect: Mood normal.        Behavior: Behavior normal.   ASSESSMENT and PLAN: This visit occurred during the SARS-CoV-2 public health emergency.  Safety protocols were in place, including screening questions prior to the visit, additional usage of staff PPE, and extensive cleaning of exam room while observing appropriate contact time as indicated for disinfecting solutions.   Kavir was seen today for annual exam.  Diagnoses and all orders for this visit:  Chronic combined systolic and diastolic CHF (congestive heart failure) (HCC) -     Basic metabolic panel -     digoxin (LANOXIN) 0.125 MG tablet; Take 1 tablet (0.125 mg total) by mouth daily. -     carvedilol (COREG) 3.125 MG tablet; Take 1 tablet (3.125 mg total) by mouth 2 (two) times daily with a meal. -     spironolactone (ALDACTONE) 25 MG tablet; Take 0.5 tablets (12.5 mg total) by mouth every other day. -     AMB Referral to [facility] term current use of anticoagulant therapy -     POCT INR -     CBC -     warfarin (COUMADIN) 5 MG tablet; Monday-Thursday take 1tab daily and Friday-Sunday take 1.5tab daily -     AMB Referral to [facility]  Mixed hyperlipidemia -     AMB Referral to [facility]  Benign prostatic hyperplasia without lower urinary tract symptoms -     PSA, Medicare -     AMB Referral to [facility]  Underweight -     TSH  Inguinal hernia of left side without obstruction or gangrene      Problem List Items Addressed This Visit       Cardiovascular and Mediastinum   Chronic combined systolic and diastolic CHF (congestive heart failure) (HCC) - Primary    HFrEF 20-25% Followed by Cardiology: Dr. Ladene Artist, last appt 08/03/2020 Stable volume today: No edema, no cough or SOB or CP or syncope or palpitations. BP Readings from Last 3 Encounters:  01/19/21 100/78  11/09/20 135/82  08/05/20 114/72   Wt Readings from  Last 3 Encounters:  01/19/21 136 lb (61.7 kg)  11/09/20 140 lb 4.8 oz (63.6 kg)  10/28/20 135 lb 6.4 oz (61.4 kg)   Continue coreg, digoxin, and spironolactone      Relevant Medications   warfarin (COUMADIN) 5 MG tablet   digoxin (LANOXIN) 0.125 MG tablet   carvedilol (COREG) 3.125 MG tablet   spironolactone (ALDACTONE) 25 MG tablet   Other Relevant Orders   Basic metabolic panel (Completed)   AMB Referral to [facility]     Genitourinary   Benign prostatic hyperplasia without lower urinary tract symptoms    Followed by Dr. Gloriann Loan with [facility]. Current use of flomax and finesteride Repeat PSA today is at 13 Denies any LUTs or hematuria.  Advised to schedule f/up with Dr. Gloriann Loan      Relevant Medications   finasteride (PROSCAR) 5 MG tablet   tamsulosin (FLOMAX) 0.4 MG CAPS capsule   Other Relevant Orders   PSA, Medicare (Completed)   AMB Referral to [facility]     Other   Hyperlipidemia   Relevant Medications   warfarin (COUMADIN)  5 MG tablet   digoxin (LANOXIN) 0.125 MG tablet   carvedilol (COREG) 3.125 MG tablet   spironolactone (ALDACTONE) 25 MG tablet   Other Relevant Orders   AMB Referral to [facility]   Inguinal hernia of left side without obstruction or gangrene    Chronic, soft and reducible. No pain with bowel movement or urination.      Long term current use of anticoagulant therapy    INR is therapeutic at 2.7 today Denies any GI/GU bleed or nosebleed or bruising. Maintain current dose, refill sent F/up in 64month      Relevant Medications   warfarin (COUMADIN) 5 MG tablet   Other Relevant Orders   POCT INR (Completed)   CBC   AMB Referral to [facility]   Other Visit Diagnoses     Underweight       Relevant Orders   TSH       Follow up: Return in about 6 months (around 07/22/2021) for CHF and hyperlipidemia.  Wilfred Lacy, NP

## 2021-01-19 NOTE — Assessment & Plan Note (Signed)
Followed by Dr. Gloriann Loan with [facility]. Current use of flomax and finesteride Repeat PSA today is at 13 Denies any LUTs or hematuria.  Advised to schedule f/up with Dr. Gloriann Loan

## 2021-01-19 NOTE — Assessment & Plan Note (Addendum)
HFrEF 20-25% Followed by Cardiology: Dr. Ladene Artist, last appt 08/03/2020 Stable volume today: No edema, no cough or SOB or CP or syncope or palpitations. BP Readings from Last 3 Encounters:  01/19/21 100/78  11/09/20 135/82  08/05/20 114/72   Wt Readings from Last 3 Encounters:  01/19/21 136 lb (61.7 kg)  11/09/20 140 lb 4.8 oz (63.6 kg)  10/28/20 135 lb 6.4 oz (61.4 kg)   Continue coreg, digoxin, and spironolactone

## 2021-01-20 ENCOUNTER — Telehealth: Payer: Self-pay | Admitting: *Deleted

## 2021-01-20 NOTE — Chronic Care Management (AMB) (Signed)
  Chronic Care Management   Outreach Note  01/20/2021 Name: [name] MRN: [id number] DOB: 1951/04/07  Jared Stevens is a 70 y.o. year old male who is a primary care patient of [name], [name], NP. I reached out to [name] by phone today in response to a referral sent by Mr. Zamarian Scarano PCP [name], [name], NP     An unsuccessful telephone outreach was attempted today. The patient was referred to the case management team for assistance with care management and care coordination.   Follow Up Plan: A HIPAA compliant phone message was left for the patient providing contact information and requesting a return call.  If patient returns call to provider office, please advise to call Embedded Care Management Care Guide Lurae Hornbrook at [place], [facility]  Direct Dial: [phone]

## 2021-01-20 NOTE — Chronic Care Management (AMB) (Signed)
  Chronic Care Management   Note  01/20/2021 Name: Jourdain Guay MRN: [id number] DOB: 1950/06/15  Codie Krogh is a 70 y.o. year old male who is a primary care patient of [name], [name], NP. I reached out to [name] by phone today in response to a referral sent by Mr. Damin Salido PCP [name], [name], NP     Mr. Horton was given information about Chronic Care Management services today including:  CCM service includes personalized support from designated clinical staff supervised by his physician, including individualized plan of care and coordination with other care providers 24/7 contact phone numbers for assistance for urgent and routine care needs. Service will only be billed when office clinical staff spend 20 minutes or more in a month to coordinate care. Only one practitioner may furnish and bill the service in a calendar month. The patient may stop CCM services at any time (effective at the end of the month) by phone call to the office staff. The patient will be responsible for cost sharing (co-pay) of up to 20% of the service fee (after annual deductible is met).  Patient agreed to services and verbal consent obtained.   Follow up plan: Telephone appointment with care management team member scheduled for: 02/16/2021  Julian Hy, [facility]  Direct Dial: [phone]

## 2021-01-26 ENCOUNTER — Other Ambulatory Visit: Payer: Self-pay | Admitting: Physician Assistant

## 2021-02-01 ENCOUNTER — Telehealth: Payer: Self-pay | Admitting: Cardiovascular Disease

## 2021-02-01 ENCOUNTER — Other Ambulatory Visit: Payer: Self-pay | Admitting: *Deleted

## 2021-02-01 DIAGNOSIS — I5042 Chronic combined systolic (congestive) and diastolic (congestive) heart failure: Secondary | ICD-10-CM

## 2021-02-01 MED ORDER — ROSUVASTATIN CALCIUM 40 MG PO TABS: 40.0000 mg | ORAL_TABLET | Freq: Every day | ORAL | 3 refills | Status: DC

## 2021-02-01 MED ORDER — CARVEDILOL 3.125 MG PO TABS: 3.1250 mg | ORAL_TABLET | Freq: Two times a day (BID) | ORAL | 3 refills | Status: DC

## 2021-02-01 NOTE — Telephone Encounter (Signed)
  Pt c/o medication issue:  1. Name of Medication: carvedilol (COREG) 3.125 MG tablet Atorvastatin  2. How are you currently taking this medication (dosage and times per day)?   3. Are you having a reaction (difficulty breathing--STAT)?   4. What is your medication issue? Michael with [facility]  calling to check in if Dr. Acie Fredrickson received a faxed from them to request change in pharmacy for the pt for these two medications

## 2021-02-01 NOTE — Telephone Encounter (Signed)
Returned call to [facility] no answer left a message to call back.

## 2021-02-07 ENCOUNTER — Ambulatory Visit: Payer: Medicare Other | Admitting: Nurse Practitioner

## 2021-02-07 NOTE — Telephone Encounter (Signed)
Patient's medications were refilled to [facility]. Will close encounter due to no further notification from pharmacy.

## 2021-02-16 ENCOUNTER — Telehealth: Payer: Medicare Other

## 2021-02-16 ENCOUNTER — Ambulatory Visit (INDEPENDENT_AMBULATORY_CARE_PROVIDER_SITE_OTHER): Payer: Medicare Other

## 2021-02-16 DIAGNOSIS — I5042 Chronic combined systolic (congestive) and diastolic (congestive) heart failure: Secondary | ICD-10-CM

## 2021-02-16 DIAGNOSIS — C3491 Malignant neoplasm of unspecified part of right bronchus or lung: Secondary | ICD-10-CM

## 2021-02-16 DIAGNOSIS — Z9181 History of falling: Secondary | ICD-10-CM

## 2021-02-16 NOTE — Patient Instructions (Signed)
Visit Information:  Thank you for taking the time to speak with me today.   PATIENT GOALS:   Goals Addressed             This Visit's Progress    Prevent Falls and Injury   On track    Timeframe:  Long-Range Goal Priority:  High Start Date:  02/16/2021                           Expected End Date:  04/27/2021                     Follow Up Date 03/23/2021    - Utilize quad cane appropriately with all ambulation - De-clutter walkways ( remove cords, throw rugs, etc.) - Change positions slowly - Wear secure fitting shoes at all times with ambulation - Utilize home lighting for dim lit areas - Demonstrate self  awareness at all times  - Review education article on Fall prevention in MyChart  Why is this important?   Most falls happen when it is hard for you to walk safely. Your balance may be off because of an illness. You may have pain in your knees, hip or other joints.  You may be overly tired or taking medicines that make you sleepy. You may not be able to see or hear clearly.  Falls can lead to broken bones, bruises or other injuries.  There are things you can do to help prevent falling.          Track and Manage Symptoms-Heart Failure   On track    Timeframe:  Long-Range Goal Priority:  Medium Start Date:     02/16/2021                        Expected End Date:  04/27/2021                     Follow Up Date 03/23/2021    - Take medications as prescribed and refill timely - Follow up with your providers as recommended - Weigh self daily and record - Monitor for signs/ symptoms of heart failure:  weight gain 3 lbs overnight or 5 lbs in a week, swelling in feet, legs ankles, waist hand,  increase shortness of breath, fatigue/ weakness ( call provider for mild to moderate symptoms. Call 911 for severe symptoms)  - Review education article on Heart failure sent to you in MyChart  Why is this important?   You will be able to handle your symptoms better if you keep track of  them.  Making some simple changes to your lifestyle will help.  Eating healthy is one thing you can do to take good care of yourself.            Consent to CCM Services: Mr. Aird was given information about Chronic Care Management services including:  CCM service includes personalized support from designated clinical staff supervised by his physician, including individualized plan of care and coordination with other care providers 24/7 contact phone numbers for assistance for urgent and routine care needs. Service will only be billed when office clinical staff spend 20 minutes or more in a month to coordinate care. Only one practitioner may furnish and bill the service in a calendar month. The patient may stop CCM services at any time (effective at the end of the month) by phone call to the office staff.  The patient will be responsible for cost sharing (co-pay) of up to 20% of the service fee (after annual deductible is met).  Patient agreed to services and verbal consent obtained.   Patient verbalizes understanding of instructions provided today and agrees to view in [place].   The patient has been provided with contact information for the care management team and has been advised to call with any health related questions or concerns.  The care management team will reach out to the patient again over the next 45 days.   [name] RN,BSN,CCM RN Case Manager [place] [phone]   CLINICAL CARE PLAN: Patient Care Plan: Fall Risk (Adult)     Problem Identified: Fall Risk   Priority: High     Long-Range Goal: Absence of Fall and Fall-Related Injury   Start Date: 02/16/2021  Expected End Date: 04/27/2021  This Visit's Progress: On track  Priority: High  Note:   Current Barriers:  Knowledge Deficits related to long term care plan for self management of falls: Patient reports more than 2 falls in the past year. He reports having a fall over a year ago  that caused a fractured left collar bone.  Patient states he continues to have pain in this area. He states he has not reported this to his doctor. RN case manager advised patient to notify his doctor of ongoing pain.  Clinical Goal(s):  patient will demonstrate improved adherence to prescribed treatment plan for decreasing falls as evidenced by patient reporting and review of EMR patient will verbalize using fall risk reduction strategies discussed patient will not experience additional falls patient will verbalize understanding of fall precautions patient will attend scheduled medical appointments Interventions:  Collaboration with [name], [name], NP regarding development and update of comprehensive plan of care as evidenced by provider attestation and co-signature Inter-disciplinary care team collaboration (see longitudinal plan of care) Provided written and verbal education re: Potential causes of falls and Fall prevention strategies Reviewed medications and discussed potential side effects of medications such as dizziness and frequent urination Assessed for falls within the past year Assessed patients knowledge of fall risk prevention. Reviewed scheduled/upcoming provider appointments   Discussed plans with patient for ongoing care management follow up and provided patient with direct contact information for care management team Sent education article on Fall prevention to patient in MyChart Patient Goals:  - Utilize quad cane appropriately with all ambulation - De-clutter walkways ( remove cords, throw rugs, etc.) - Change positions slowly - Wear secure fitting shoes at all times with ambulation - Utilize home lighting for dim lit areas - Demonstrate self  awareness at all times - Review education article on Fall prevention in MyChart Follow Up Plan: The patient has been provided with contact information for the care management team and has been advised to call with any health  related questions or concerns.  The care management team will reach out to the patient again over the next 45 days.        Patient Care Plan: Heart Failure (Adult)     Problem Identified: Symptom Exacerbation (Heart Failure)   Priority: Medium     Long-Range Goal: Symptom Exacerbation Prevented or Minimized   Start Date: 02/16/2021  Expected End Date: 04/27/2021  This Visit's Progress: On track  Priority: Medium  Note:   Objective:  Last practice recorded BP readings:  BP Readings from Last 3 Encounters:  01/19/21 100/78  11/09/20 135/82  08/05/20 114/72  Current Barriers:  Knowledge Deficits related  to long term care plan for self management of heart failure:   Patient status post heart failure 2020.  Patient states he is able to afford his medications and takes the as prescribed. He states he has transportation assistance through his [place] health care plan and Medicaid.   Patient states his daughter helps him keep up with his provider visits.  Last cardiology visit 08/03/2020 Case Manager Clinical Goal(s):  patient will verbalize understanding of plan for heart failure management patient will attend scheduled medical appointments Patient will monitor his weight daily Patient will take medication as prescribed Patient will follow up with provider as recommended.  Interventions:  Collaboration with [name], [name], NP regarding development and update of comprehensive plan of care as evidenced by provider attestation and co-signature Inter-disciplinary care team collaboration (see longitudinal plan of care) Evaluation of current treatment plan related to heart failure self management and patient's adherence to plan as established by provider. Reviewed medications with patient and discussed importance of compliance Discussed plans with patient for ongoing care management follow up and provided patient with direct contact information for care management team Reviewed  scheduled/upcoming provider appointments  Sent education article on heart failure through MyChart to patient Patient Goals: - Take medications as prescribed and refill timely - Follow up with your providers as recommended - Weigh self daily and record - Monitor for signs/ symptoms of heart failure:  weight gain 3 lbs overnight or 5 lbs in a week, swelling in feet, legs ankles, waist hand,  increase shortness of breath, fatigue/ weakness ( call provider for mild to moderate symptoms. Call 911 for severe symptoms) - Review education article on Heart failure sent to you in MyChart Follow Up Plan: The patient has been provided with contact information for the care management team and has been advised to call with any health related questions or concerns.  The care management team will reach out to the patient again over the next 45 days.

## 2021-02-16 NOTE — Chronic Care Management (AMB) (Signed)
Chronic Care Management   CCM RN Visit Note  02/16/2021 Name: Jared Stevens MRN: [id number] DOB: 01/02/1951  Subjective: Jared Stevens is a 70 y.o. year old male who is a primary care patient of [name], [name], NP. The care management team was consulted for assistance with disease management and care coordination needs.    Engaged with patient by telephone for initial visit in response to provider referral for case management and/or care coordination services.   Consent to Services:  The patient was given the following information about Chronic Care Management services today, agreed to services, and gave verbal consent: 1. CCM service includes personalized support from designated clinical staff supervised by the primary care provider, including individualized plan of care and coordination with other care providers 2. 24/7 contact phone numbers for assistance for urgent and routine care needs. 3. Service will only be billed when office clinical staff spend 20 minutes or more in a month to coordinate care. 4. Only one practitioner may furnish and bill the service in a calendar month. 5.The patient may stop CCM services at any time (effective at the end of the month) by phone call to the office staff. 6. The patient will be responsible for cost sharing (co-pay) of up to 20% of the service fee (after annual deductible is met). Patient agreed to services and consent obtained.  Patient agreed to services and verbal consent obtained.   Assessment: Review of patient past medical history, allergies, medications, health status, including review of consultants reports, laboratory and other test data, was performed as part of comprehensive evaluation and provision of chronic care management services.   SDOH (Social Determinants of Health) assessments and interventions performed:  SDOH Interventions    Flowsheet Row Most Recent Value  SDOH Interventions   Food Insecurity Interventions Intervention Not  Indicated  Transportation Interventions Intervention Not Indicated  Depression Interventions/Treatment  Medication, Counseling        CCM Care Plan  No Known Allergies  Outpatient Encounter Medications as of 02/16/2021  Medication Sig   albuterol (VENTOLIN HFA) 108 (90 Base) MCG/ACT inhaler Inhale 2 puffs into the lungs every 6 (six) hours as needed for wheezing or shortness of breath. Patient states he takes only as needed.   amitriptyline (ELAVIL) 50 MG tablet Take 50 mg by mouth at bedtime as needed.    carvedilol (COREG) 3.125 MG tablet Take 1 tablet (3.125 mg total) by mouth 2 (two) times daily with a meal.   Cyanocobalamin (B-12) 2500 MCG SUBL Place under the tongue.   digoxin (LANOXIN) 0.125 MG tablet Take 1 tablet (0.125 mg total) by mouth daily.   finasteride (PROSCAR) 5 MG tablet Take 5 mg by mouth daily.   furosemide (LASIX) 40 MG tablet Take 40 mg by mouth daily.   latanoprost (XALATAN) 0.005 % ophthalmic solution Place 1 drop into both eyes daily.   rosuvastatin (CRESTOR) 40 MG tablet Take 1 tablet (40 mg total) by mouth daily.   spironolactone (ALDACTONE) 25 MG tablet Take 0.5 tablets (12.5 mg total) by mouth every other day.   SYMBICORT 160-4.5 MCG/ACT inhaler INL 2 PFS PO BID IN THE MORNING AND IN THE EVE   tamsulosin (FLOMAX) 0.4 MG CAPS capsule Take 0.4 mg by mouth at bedtime.   warfarin (COUMADIN) 5 MG tablet Monday-Thursday take 1tab daily and Friday-Sunday take 1.5tab daily   No facility-administered encounter medications on file as of 02/16/2021.    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   Recurrent right pleural effusion    History of lung cancer 09/27/2018   Acute on chronic respiratory failure with hypoxia ([facility]) 09/27/2018   Elevated brain natriuretic peptide (BNP) level 09/27/2018   COPD (chronic obstructive pulmonary disease) ([place]) 09/27/2018   Hyperlipidemia 09/27/2018   Tobacco use disorder, continuous 09/27/2018   Non-small cell lung cancer, right ([place]) 08/26/2018    Conditions to be addressed/monitored:CHF and Falls  Care Plan : Fall Risk (Adult)  Updates made by Dannielle Karvonen, RN since 02/16/2021 12:00 AM     Problem: Fall Risk   Priority: High     Long-Range Goal: Absence of Fall and Fall-Related Injury   Start Date: 02/16/2021  Expected End Date: 04/27/2021  This Visit's Progress: On track  Priority: High  Note:   Current Barriers:  Knowledge Deficits related to long term care plan for self management of falls: Patient reports more than 2 falls in the past year. He reports having a fall over a year ago that caused a fractured left collar bone.  Patient states he continues to have pain in this area. He states he has not reported this to his doctor. RN case manager advised patient to notify his doctor of ongoing pain.  Clinical Goal(s):  patient will demonstrate improved adherence to prescribed treatment plan for decreasing falls as evidenced by patient reporting and review of EMR patient will verbalize using fall risk reduction strategies discussed patient will not experience additional falls patient will verbalize understanding of fall precautions patient will attend scheduled medical appointments Interventions:  Collaboration with [name], [name], NP regarding development and update of comprehensive plan of care as evidenced by provider attestation and co-signature Inter-disciplinary care team collaboration (see  longitudinal plan of care) Provided written and verbal education re: Potential causes of falls and Fall prevention strategies Reviewed medications and discussed potential side effects of medications such as dizziness and frequent urination Assessed for falls within the past year Assessed patients knowledge of fall risk prevention. Reviewed scheduled/upcoming provider appointments   Discussed plans with patient for ongoing care management follow up and provided patient with direct contact information for care management team Sent education article on Fall prevention to patient in MyChart Patient Goals:  - Utilize quad cane appropriately with all ambulation - De-clutter walkways ( remove cords, throw rugs, etc.) - Change positions slowly - Wear secure fitting shoes at all times with ambulation - Utilize home lighting for dim lit areas - Demonstrate self  awareness at all times - Review education article on Fall prevention in MyChart Follow Up Plan: The patient has been provided with contact information for the care management team and has been advised to call with any health related questions or concerns.  The care management team will reach out to the patient again over the next 45 days.        Care Plan : Heart Failure (Adult)  Updates made by Dannielle Karvonen, RN since 02/16/2021 12:00 AM     Problem: Symptom Exacerbation (Heart Failure)   Priority: Medium     Long-Range Goal:  Symptom Exacerbation Prevented or Minimized   Start Date: 02/16/2021  Expected End Date: 04/27/2021  This Visit's Progress: On track  Priority: Medium  Note:   Objective:  Last practice recorded BP readings:  BP Readings from Last 3 Encounters:  01/19/21 100/78  11/09/20 135/82  08/05/20 114/72  Current Barriers:  Knowledge Deficits related to long term care plan for self management of heart failure:   Patient status post heart failure 2020.  Patient states he is able to afford his medications and takes  the as prescribed. He states he has transportation assistance through his [place] health care plan and Medicaid.   Patient states his daughter helps him keep up with his provider visits.  Last cardiology visit 08/03/2020 Case Manager Clinical Goal(s):  patient will verbalize understanding of plan for heart failure management patient will attend scheduled medical appointments Patient will monitor his weight daily Patient will take medication as prescribed Patient will follow up with provider as recommended.  Interventions:  Collaboration with [name], [name], NP regarding development and update of comprehensive plan of care as evidenced by provider attestation and co-signature Inter-disciplinary care team collaboration (see longitudinal plan of care) Evaluation of current treatment plan related to heart failure self management and patient's adherence to plan as established by provider. Reviewed medications with patient and discussed importance of compliance Discussed plans with patient for ongoing care management follow up and provided patient with direct contact information for care management team Reviewed scheduled/upcoming provider appointments  Sent education article on heart failure through MyChart to patient Patient Goals: - Take medications as prescribed and refill timely - Follow up with your providers as recommended - Weigh self daily and record - Monitor for signs/ symptoms of heart failure:  weight gain 3 lbs overnight or 5 lbs in a week, swelling in feet, legs ankles, waist hand,  increase shortness of breath, fatigue/ weakness ( call provider for mild to moderate symptoms. Call 911 for severe symptoms) - Review education article on Heart failure sent to you in MyChart Follow Up Plan: The patient has been provided with contact information for the care management team and has been advised to call with any health related questions or concerns.  The care management team will reach out  to the patient again over the next 45 days.       Plan:The patient has been provided with contact information for the care management team and has been advised to call with any health related questions or concerns.  and The care management team will reach out to the patient again over the next 45 days. [name] RN,BSN,CCM RN Case Manager [place] [phone]

## 2021-02-17 ENCOUNTER — Telehealth: Payer: Self-pay | Admitting: Nurse Practitioner

## 2021-02-17 ENCOUNTER — Other Ambulatory Visit: Payer: Self-pay

## 2021-02-17 ENCOUNTER — Other Ambulatory Visit (INDEPENDENT_AMBULATORY_CARE_PROVIDER_SITE_OTHER): Payer: Medicare Other

## 2021-02-17 DIAGNOSIS — Z7901 Long term (current) use of anticoagulants: Secondary | ICD-10-CM | POA: Diagnosis not present

## 2021-02-17 LAB — PROTIME-INR
INR: 1.5 ratio — ABNORMAL HIGH (ref 0.8–1.0)
Prothrombin Time: 16.4 s — ABNORMAL HIGH (ref 9.6–13.1)

## 2021-02-17 NOTE — Telephone Encounter (Signed)
Please call pt back at [phone]. He has a lot of questions concerning his Chronic Care Management appointment yesterday.

## 2021-02-18 ENCOUNTER — Other Ambulatory Visit: Payer: Self-pay | Admitting: Family Medicine

## 2021-02-18 ENCOUNTER — Telehealth: Payer: Self-pay

## 2021-02-18 DIAGNOSIS — J449 Chronic obstructive pulmonary disease, unspecified: Secondary | ICD-10-CM

## 2021-02-18 NOTE — Telephone Encounter (Signed)
Can you please and thank you look at the PT/INR that was done yesterday under [facility].  Patient is calling wanting to know directions for his coumadin? Thanks.  Dm/cma

## 2021-02-18 NOTE — Telephone Encounter (Signed)
Pt checking on status of this message. He he wants to know his INR result. He wants an AVS from his last office visit in the mail.  Please contact pt

## 2021-02-18 NOTE — Telephone Encounter (Signed)
Current dose is M-Th 5 mg and F-Sun 7.5 mg   Advised him per Ethelene Hal to take  M - W 5mg  and Th- Sun 75 mg.  Next nurse visit scheduled 03/03/21 @ 10:20 am.  Had him read back instructions and appointment time.   Dm/cma

## 2021-02-18 NOTE — Telephone Encounter (Signed)
Message sent to Dr Ethelene Hal to advise. Dm/cma

## 2021-02-25 DIAGNOSIS — I5042 Chronic combined systolic (congestive) and diastolic (congestive) heart failure: Secondary | ICD-10-CM

## 2021-02-25 DIAGNOSIS — C3491 Malignant neoplasm of unspecified part of right bronchus or lung: Secondary | ICD-10-CM

## 2021-03-03 ENCOUNTER — Ambulatory Visit (INDEPENDENT_AMBULATORY_CARE_PROVIDER_SITE_OTHER): Payer: Medicare Other

## 2021-03-03 ENCOUNTER — Other Ambulatory Visit: Payer: Self-pay

## 2021-03-03 DIAGNOSIS — Z7901 Long term (current) use of anticoagulants: Secondary | ICD-10-CM

## 2021-03-03 LAB — PROTIME-INR
INR: 3.3 ratio — ABNORMAL HIGH (ref 0.8–1.0)
Prothrombin Time: 34 s — ABNORMAL HIGH (ref 9.6–13.1)

## 2021-03-03 NOTE — Progress Notes (Deleted)
Pt here for INR check per Baldo Ash [name]   Goal INR = 2.0-3.0   Last INR = 1.5   Pt currently takes Coumadin 5 mg Mon.-Thurs. 7.5 mg Fri.-Sun.   Date of last Coumadin dose =    Pt denies recent antibiotics, no dietary changes and no unusual bruising / bleeding.   INR today =    Pt advised per  Wilfred Lacy,  Stop Friday dose and resume regular dose starting Saturday.    5 mg on Mon.-Thurs.  7.5 mg Fri.-Sun.

## 2021-03-03 NOTE — Progress Notes (Signed)
Per the orders of Jared Stevens pt is here for labs, pt tolerated draw well.

## 2021-03-09 ENCOUNTER — Ambulatory Visit (INDEPENDENT_AMBULATORY_CARE_PROVIDER_SITE_OTHER): Payer: Medicare Other | Admitting: Nurse Practitioner

## 2021-03-09 ENCOUNTER — Other Ambulatory Visit: Payer: Self-pay

## 2021-03-09 ENCOUNTER — Encounter: Payer: Self-pay | Admitting: Nurse Practitioner

## 2021-03-09 VITALS — BP 110/68 | HR 82 | Temp 98.0°F | Wt 137.0 lb

## 2021-03-09 DIAGNOSIS — Z1211 Encounter for screening for malignant neoplasm of colon: Secondary | ICD-10-CM | POA: Diagnosis not present

## 2021-03-09 DIAGNOSIS — I5042 Chronic combined systolic (congestive) and diastolic (congestive) heart failure: Secondary | ICD-10-CM | POA: Diagnosis not present

## 2021-03-09 DIAGNOSIS — E782 Mixed hyperlipidemia: Secondary | ICD-10-CM | POA: Diagnosis not present

## 2021-03-09 NOTE — Assessment & Plan Note (Signed)
LDL at goal with crestor Continuous tobacco use, denies desire to quit at this time. Lipid Panel     Component Value Date/Time   CHOL 138 11/05/2020 0734   TRIG 42 11/05/2020 0734   HDL 58 11/05/2020 0734   CHOLHDL 2.4 11/05/2020 0734   CHOLHDL 3.2 10/22/2018 0425   VLDL 17 10/22/2018 0425   LDLCALC 70 11/05/2020 0734   LABVLDL 10 11/05/2020 0734

## 2021-03-09 NOTE — Patient Instructions (Addendum)
Sign medical release to get colonoscopy report You will be contacted to schedule appt with GI.  Maintain current medications  Ok to use tylenol (acetaminophen) 650mg  every 8hrs as needed for pain

## 2021-03-09 NOTE — Assessment & Plan Note (Signed)
Stable BP and weight. Wt Readings from Last 3 Encounters:  03/09/21 137 lb (62.1 kg)  01/19/21 136 lb (61.7 kg)  11/09/20 140 lb 4.8 oz (63.6 kg)   BP Readings from Last 3 Encounters:  03/09/21 110/68  01/19/21 100/78  11/09/20 135/82

## 2021-03-09 NOTE — Progress Notes (Signed)
Subjective:  Patient ID: Jared Stevens, male    DOB: 1951-02-12  Age: 70 y.o. MRN: [id number]  CC: Follow-up (6 month f/u on HTN and cholesterol. Declines vaccines/)  HPI Hyperlipidemia LDL at goal with crestor Continuous tobacco use, denies desire to quit at this time. Lipid Panel     Component Value Date/Time   CHOL 138 11/05/2020 0734   TRIG 42 11/05/2020 0734   HDL 58 11/05/2020 0734   CHOLHDL 2.4 11/05/2020 0734   CHOLHDL 3.2 10/22/2018 0425   VLDL 17 10/22/2018 0425   LDLCALC 70 11/05/2020 0734   LABVLDL 10 11/05/2020 0734    Chronic combined systolic and diastolic CHF (congestive heart failure) (HCC) Stable BP and weight. Wt Readings from Last 3 Encounters:  03/09/21 137 lb (62.1 kg)  01/19/21 136 lb (61.7 kg)  11/09/20 140 lb 4.8 oz (63.6 kg)   BP Readings from Last 3 Encounters:  03/09/21 110/68  01/19/21 100/78  11/09/20 135/82    Colon cancer screen: He thinks he has colonoscopy in 2018 while in [place]. He states this was normal. Denies any abdominal pain, no change in BM, no melena or hematochezia. No nausea. Normal appetite. He has hx of prostate cancer, under [facility]. He agreed to GI referral and to sign medical release form for previous colonoscopy report.   Reviewed past Medical, Social and Family history today.  Outpatient Medications Prior to Visit  Medication Sig Dispense Refill   albuterol (VENTOLIN HFA) 108 (90 Base) MCG/ACT inhaler Inhale 2 puffs into the lungs every 6 (six) hours as needed for wheezing or shortness of breath. Patient states he takes only as needed.     amitriptyline (ELAVIL) 50 MG tablet Take 50 mg by mouth at bedtime as needed.      carvedilol (COREG) 3.125 MG tablet Take 1 tablet (3.125 mg total) by mouth 2 (two) times daily with a meal. 180 tablet 3   Cyanocobalamin (B-12) 2500 MCG SUBL Place under the tongue.     digoxin (LANOXIN) 0.125 MG tablet Take 1 tablet (0.125 mg total) by mouth daily. 90  tablet 3   finasteride (PROSCAR) 5 MG tablet Take 5 mg by mouth daily.     latanoprost (XALATAN) 0.005 % ophthalmic solution Place 1 drop into both eyes daily.  11   rosuvastatin (CRESTOR) 40 MG tablet Take 1 tablet (40 mg total) by mouth daily. 90 tablet 3   spironolactone (ALDACTONE) 25 MG tablet Take 0.5 tablets (12.5 mg total) by mouth every other day. 45 tablet 1   SYMBICORT 160-4.5 MCG/ACT inhaler INHALE 2 PUFFS BY MOUTH TWICE DAILY IN THE MORNING AND EVENING 1 each 0   tamsulosin (FLOMAX) 0.4 MG CAPS capsule Take 0.4 mg by mouth at bedtime.     warfarin (COUMADIN) 5 MG tablet Monday-Thursday take 1tab daily and Friday-Sunday take 1.5tab daily 34 tablet 5   furosemide (LASIX) 40 MG tablet Take 40 mg by mouth daily.     No facility-administered medications prior to visit.   ROS See HPI  Objective:  BP 110/68 (BP Location: Left Arm, Patient Position: Sitting, Cuff Size: Normal)   Pulse 82   Temp 98 F (36.7 C) (Temporal)   Wt 137 lb (62.1 kg)   SpO2 98%   BMI 18.07 kg/m   Physical Exam Cardiovascular:     Rate and Rhythm: Normal rate and regular rhythm.     Pulses: Normal pulses.     Heart sounds: Normal heart sounds.  Pulmonary:     Effort: Pulmonary effort is normal.     Breath sounds: Normal breath sounds.  Musculoskeletal:     Right lower leg: No edema.     Left lower leg: No edema.  Neurological:     Mental Status: He is alert and oriented to person, place, and time.  Psychiatric:        Mood and Affect: Mood normal.        Behavior: Behavior normal.        Thought Content: Thought content normal.   Assessment & Plan:  This visit occurred during the SARS-CoV-2 public health emergency.  Safety protocols were in place, including screening questions prior to the visit, additional usage of staff PPE, and extensive cleaning of exam room while observing appropriate contact time as indicated for disinfecting solutions.   Jared Stevens was seen today for  follow-up.  Diagnoses and all orders for this visit:  Mixed hyperlipidemia  Colon cancer screening -     Ambulatory referral to Gastroenterology  Chronic combined systolic and diastolic CHF (congestive heart failure) ([place])   Problem List Items Addressed This Visit       Cardiovascular and Mediastinum   Chronic combined systolic and diastolic CHF (congestive heart failure) (HCC)    Stable BP and weight. Wt Readings from Last 3 Encounters:  03/09/21 137 lb (62.1 kg)  01/19/21 136 lb (61.7 kg)  11/09/20 140 lb 4.8 oz (63.6 kg)   BP Readings from Last 3 Encounters:  03/09/21 110/68  01/19/21 100/78  11/09/20 135/82          Other   Hyperlipidemia - Primary    LDL at goal with crestor Continuous tobacco use, denies desire to quit at this time. Lipid Panel     Component Value Date/Time   CHOL 138 11/05/2020 0734   TRIG 42 11/05/2020 0734   HDL 58 11/05/2020 0734   CHOLHDL 2.4 11/05/2020 0734   CHOLHDL 3.2 10/22/2018 0425   VLDL 17 10/22/2018 0425   LDLCALC 70 11/05/2020 0734   LABVLDL 10 11/05/2020 0734        Other Visit Diagnoses     Colon cancer screening       Relevant Orders   Ambulatory referral to Gastroenterology       Follow-up: Return in about 6 months (around 09/07/2021) for CPE (fasting).  Wilfred Lacy, NP

## 2021-03-11 ENCOUNTER — Telehealth: Payer: Self-pay | Admitting: Nurse Practitioner

## 2021-03-11 NOTE — Telephone Encounter (Signed)
Pharmacy called to request abuteral

## 2021-03-15 ENCOUNTER — Ambulatory Visit: Payer: Medicare Other

## 2021-03-16 NOTE — Telephone Encounter (Signed)
Called patient to confirm this was still needed, unable to reach patient.   Pt seen on 03/09/21, wanted to confirm if this was addressed during appointment. Informed pt to return call.

## 2021-03-20 ENCOUNTER — Encounter (HOSPITAL_COMMUNITY): Payer: Self-pay | Admitting: Emergency Medicine

## 2021-03-20 ENCOUNTER — Emergency Department (HOSPITAL_COMMUNITY)
Admission: EM | Admit: 2021-03-20 | Discharge: 2021-03-20 | Disposition: A | Discharge status: Home / Self Care | Payer: Medicare Other | Attending: Emergency Medicine | Admitting: Emergency Medicine

## 2021-03-20 ENCOUNTER — Other Ambulatory Visit: Payer: Self-pay

## 2021-03-20 DIAGNOSIS — R7309 Other abnormal glucose: Secondary | ICD-10-CM | POA: Diagnosis not present

## 2021-03-20 DIAGNOSIS — Z7951 Long term (current) use of inhaled steroids: Secondary | ICD-10-CM | POA: Insufficient documentation

## 2021-03-20 DIAGNOSIS — J449 Chronic obstructive pulmonary disease, unspecified: Secondary | ICD-10-CM | POA: Insufficient documentation

## 2021-03-20 DIAGNOSIS — Z79899 Other long term (current) drug therapy: Secondary | ICD-10-CM | POA: Diagnosis not present

## 2021-03-20 DIAGNOSIS — I5042 Chronic combined systolic (congestive) and diastolic (congestive) heart failure: Secondary | ICD-10-CM | POA: Insufficient documentation

## 2021-03-20 DIAGNOSIS — Z85118 Personal history of other malignant neoplasm of bronchus and lung: Secondary | ICD-10-CM | POA: Insufficient documentation

## 2021-03-20 DIAGNOSIS — I2510 Atherosclerotic heart disease of native coronary artery without angina pectoris: Secondary | ICD-10-CM | POA: Diagnosis not present

## 2021-03-20 DIAGNOSIS — R5383 Other fatigue: Secondary | ICD-10-CM | POA: Diagnosis not present

## 2021-03-20 DIAGNOSIS — Z20822 Contact with and (suspected) exposure to covid-19: Secondary | ICD-10-CM | POA: Insufficient documentation

## 2021-03-20 DIAGNOSIS — E11649 Type 2 diabetes mellitus with hypoglycemia without coma: Secondary | ICD-10-CM | POA: Diagnosis not present

## 2021-03-20 DIAGNOSIS — F17210 Nicotine dependence, cigarettes, uncomplicated: Secondary | ICD-10-CM | POA: Insufficient documentation

## 2021-03-20 DIAGNOSIS — E162 Hypoglycemia, unspecified: Secondary | ICD-10-CM

## 2021-03-20 LAB — BASIC METABOLIC PANEL
Anion gap: 8 (ref 5–15)
BUN: 17 mg/dL (ref 8–23)
CO2: 23 mmol/L (ref 22–32)
Calcium: 9.1 mg/dL (ref 8.9–10.3)
Chloride: 105 mmol/L (ref 98–111)
Creatinine, Ser: 0.72 mg/dL (ref 0.61–1.24)
GFR, Estimated: >60 mL/min (ref >60)
Glucose, Bld: 63 mg/dL — ABNORMAL LOW (ref 70–99)
Potassium: 4.4 mmol/L (ref 3.5–5.1)
Sodium: 136 mmol/L (ref 135–145)

## 2021-03-20 LAB — DIFFERENTIAL
Abs Immature Granulocytes: 0.04 10*3/uL (ref 0.00–0.07)
Basophils Absolute: 0.1 10*3/uL (ref 0.0–0.1)
Basophils Relative: 1 %
Eosinophils Absolute: 0.2 10*3/uL (ref 0.0–0.5)
Eosinophils Relative: 2 %
Immature Granulocytes: 1 %
Lymphocytes Relative: 18 %
Lymphs Abs: 1.4 10*3/uL (ref 0.7–4.0)
Monocytes Absolute: 0.5 10*3/uL (ref 0.1–1.0)
Monocytes Relative: 6 %
Neutro Abs: 5.9 10*3/uL (ref 1.7–7.7)
Neutrophils Relative %: 72 %

## 2021-03-20 LAB — URINALYSIS, ROUTINE W REFLEX MICROSCOPIC
Bilirubin Urine: NEGATIVE
Glucose, UA: NEGATIVE mg/dL
Hgb urine dipstick: NEGATIVE
Ketones, ur: NEGATIVE mg/dL
Leukocytes,Ua: NEGATIVE
Nitrite: POSITIVE — AB
Protein, ur: NEGATIVE mg/dL
Specific Gravity, Urine: 1.006 (ref 1.005–1.030)
pH: 5 (ref 5.0–8.0)

## 2021-03-20 LAB — CBC
HCT: 45.9 % (ref 39.0–52.0)
Hemoglobin: 15.1 g/dL (ref 13.0–17.0)
MCH: 29.6 pg (ref 26.0–34.0)
MCHC: 32.9 g/dL (ref 30.0–36.0)
MCV: 90 fL (ref 80.0–100.0)
Platelets: 327 10*3/uL (ref 150–400)
RBC: 5.1 MIL/uL (ref 4.22–5.81)
RDW: 15.6 % — ABNORMAL HIGH (ref 11.5–15.5)
WBC: 8 10*3/uL (ref 4.0–10.5)
nRBC: 0 % (ref 0.0–0.2)

## 2021-03-20 LAB — CBG MONITORING, ED: Glucose-Capillary: 113 mg/dL — ABNORMAL HIGH (ref 70–99)

## 2021-03-20 LAB — RESP PANEL BY RT-PCR (FLU A&B, COVID) ARPGX2
Influenza A by PCR: NEGATIVE
Influenza B by PCR: NEGATIVE
SARS Coronavirus 2 by RT PCR: NEGATIVE

## 2021-03-20 LAB — TSH: TSH: 0.557 u[IU]/mL (ref 0.350–4.500)

## 2021-03-20 LAB — T4, FREE: Free T4: 1.11 ng/dL (ref 0.61–1.12)

## 2021-03-20 LAB — PROTIME-INR
INR: 2 — ABNORMAL HIGH (ref 0.8–1.2)
Prothrombin Time: 22.9 seconds — ABNORMAL HIGH (ref 11.4–15.2)

## 2021-03-20 NOTE — Discharge Instructions (Signed)
Your blood sugar was slightly low when you got here.  Please stay hydrated and eat normally  Your other electrolytes are unremarkable right now and your COVID test is negative  Please follow-up with your doctor  Return to ER if you have worse fatigue, chest pain, shortness of breath

## 2021-03-20 NOTE — ED Triage Notes (Signed)
Patient complains of generalized weakness, "I just didn't feel like doing anything' since yesterday. Denies pain or fevers, N/V/D. Reports fatigue.

## 2021-03-20 NOTE — ED Provider Notes (Signed)
Emergency Medicine Provider Triage Evaluation Note  Jared Stevens , a 70 y.o. male  was evaluated in triage.  Pt complains of fatigue over the last 24 hours.  He denies any associated symptoms.  Denies any sick contacts.  Review of Systems  Positive:  Negative: See above   Physical Exam  BP 112/84   Pulse 85   Temp 98.3 F (36.8 C) (Oral)   Resp 18   Ht 6\' 1"  (1.854 m)   Wt 64.9 kg   SpO2 98%   BMI 18.87 kg/m  Gen:   Awake, no distress   Resp:  Normal effort  MSK:   Moves extremities without difficulty  Other:    Medical Decision Making  Medically screening exam initiated at 2:20 PM.  Appropriate orders placed.  Quayshawn Nin was informed that the remainder of the evaluation will be completed by another provider, this initial triage assessment does not replace that evaluation, and the importance of remaining in the ED until their evaluation is complete.     Hendricks Limes, PA-C 03/20/21 1421    Jeanell Sparrow, DO 03/20/21 2352

## 2021-03-20 NOTE — ED Provider Notes (Signed)
[facility] DEPT Provider Note   CSN: [id number] Arrival date & time: 03/20/21  1343     History Chief Complaint  Patient presents with   Fatigue    Jared Stevens is a 70 y.o. male history of heart failure, CAD here presenting with fatigue.  Patient states that he woke up today just feels tired.  He denies any headaches or chest pain or shortness of breath.  Patient denies any abdominal pain or vomiting.  Patient did not eat any lunch but had some breakfast.  Denies any history of diabetes  The history is provided by the patient.      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 // Coumadin (managed by PCP)   Hx of recurrent R pleural effusion    s/p thoracentesis   Hypercholesteremia    Long term (current) use of anticoagulants 01/08/2020   Lung cancer ([place]) 03/2016   Mixed Ischemic and Non-Ischemic Cardiomyopathy    Non-small cell carcinoma of lung    Nonsustained ventricular tachycardia     Patient Active Problem List   Diagnosis Date Noted   Benign prostatic hyperplasia without lower urinary tract symptoms 01/19/2021   Inguinal hernia of left side without obstruction or gangrene 01/19/2021   Acute urinary retention 07/28/2020   Long term current use of anticoagulant therapy 01/01/2020   Chronic combined systolic and diastolic CHF (congestive heart failure) ([place] [facility]) 10/31/2019   Neurocognitive deficits  11/06/2018   Encephalopathy 10/30/2018   Stroke (cerebrum) ([place]) 10/21/2018   Middle cerebral artery embolism, right 10/21/2018   Recurrent right pleural effusion    History of lung cancer 09/27/2018   Acute on chronic respiratory failure with hypoxia ([place]) 09/27/2018   Elevated brain natriuretic peptide (BNP) level 09/27/2018   COPD (chronic obstructive pulmonary disease) ([facility]) 09/27/2018   Hyperlipidemia 09/27/2018   Tobacco use disorder, continuous 09/27/2018   Non-small cell lung cancer, right ([place]) 08/26/2018    Past Surgical History:  Procedure Laterality Date   Arm Surgery     HERNIA REPAIR     IR ANGIO INTRA EXTRACRAN SEL COM CAROTID INNOMINATE UNI L MOD SED  10/21/2018   IR ANGIO VERTEBRAL SEL SUBCLAVIAN INNOMINATE UNI R MOD SED  10/21/2018   IR CT HEAD LTD  10/21/2018   IR PERCUTANEOUS ART THROMBECTOMY/INFUSION INTRACRANIAL INC DIAG ANGIO  10/21/2018   IR THORACENTESIS ASP PLEURAL SPACE W/IMG GUIDE  09/27/2018   RADIOLOGY WITH ANESTHESIA N/A 10/21/2018   Procedure: RADIOLOGY WITH ANESTHESIA;  Surgeon: Luanne Bras, MD;  Location: [facility];  Service: Radiology;  Laterality: N/A;   REPLANTATION THUMB     RIGHT/LEFT HEART CATH AND CORONARY ANGIOGRAPHY N/A 10/01/2018   Procedure: RIGHT/LEFT HEART CATH AND CORONARY ANGIOGRAPHY;  Surgeon: Troy Sine, MD;  Location: [facility];  Service: Cardiovascular;  Laterality: N/A;       Family History  Problem Relation Age of Onset   Aneurysm Mother     Social History   Tobacco Use   Smoking status: Some Days    Packs/day: 0.50    Years: 15.00    Pack years: 7.50    Types: Cigarettes   Smokeless tobacco: Never  Vaping Use   Vaping Use: Never used  Substance Use Topics   Alcohol use: Yes    Alcohol/week: 2.0 standard drinks    Types: 2 Cans of beer per week    Comment: 1-2 beers weekly   Drug use: Not Currently    Home Medications Prior to Admission medications   Medication Sig Start Date End Date Taking?  Authorizing Provider  albuterol (VENTOLIN HFA) 108 (90 Base) MCG/ACT inhaler Inhale 2 puffs into the lungs every 6 (six) hours as needed for wheezing or shortness of breath. Patient states he takes only as needed.    [provider]  amitriptyline (ELAVIL) 50 MG tablet Take 50 mg by mouth at bedtime as needed.  09/17/18   [provider]  carvedilol (COREG) 3.125 MG tablet Take 1 tablet (3.125 mg total) by mouth 2 (two) times daily with a meal. 02/01/21   Nahser, Wonda Cheng, MD  Cyanocobalamin (B-12) 2500 MCG SUBL Place under the tongue.    [provider]  digoxin (LANOXIN) 0.125 MG tablet Take 1 tablet (0.125 mg total) by mouth daily. 01/19/21   [name], [name], NP  finasteride (PROSCAR) 5 MG tablet Take 5 mg by mouth daily. 12/07/20   [provider]  latanoprost (XALATAN) 0.005 % ophthalmic solution Place 1 drop into both eyes daily. 03/12/18   [provider]  rosuvastatin (CRESTOR) 40 MG tablet Take 1 tablet (40 mg total) by mouth daily. 02/01/21   Nahser, Wonda Cheng, MD  spironolactone (ALDACTONE) 25 MG tablet Take 0.5 tablets (12.5 mg total) by mouth every other day. 01/19/21   [name], [name], NP  SYMBICORT 160-4.5 MCG/ACT inhaler INHALE 2 PUFFS BY MOUTH TWICE DAILY IN THE MORNING AND EVENING 02/18/21   Libby Maw, MD  tamsulosin (FLOMAX) 0.4 MG CAPS capsule Take 0.4 mg by mouth at bedtime. 11/05/20   [provider]  warfarin (COUMADIN) 5 MG tablet Monday-Thursday take 1tab daily and Friday-Sunday take 1.5tab daily 01/19/21   [name], [name], NP    Allergies    Patient has no known allergies.  Review of Systems   Review of Systems  Neurological:  Positive for weakness.  All other systems reviewed and are negative.  Physical Exam Updated Vital Signs BP 111/79   Pulse 85   Temp 98.1 F (36.7 C) (Oral)   Resp 16   Ht 6\' 1"  (1.854 m)   Wt 64.9 kg   SpO2 100%   BMI 18.87 kg/m   Physical Exam Vitals and nursing  note reviewed.  Constitutional:      Comments: Well-appearing  HENT:     Head: Normocephalic.     Nose: Nose normal.     Mouth/Throat:     Mouth: Mucous membranes are moist.  Eyes:     Extraocular Movements: Extraocular movements intact.     Pupils: Pupils are equal, round, and reactive to light.  Cardiovascular:     Rate and Rhythm: Normal rate and regular rhythm.     Pulses: Normal pulses.     Heart sounds: Normal heart sounds.  Pulmonary:     Effort: Pulmonary effort is normal.     Breath sounds: Normal breath sounds.  Abdominal:     General: Abdomen is flat.     Palpations: Abdomen is soft.  Musculoskeletal:        General: Normal range of motion.     Cervical back: Normal range of motion and neck supple.  Skin:    General: Skin is warm.     Capillary Refill: Capillary refill takes less than 2 seconds.  Neurological:     General: No focal deficit present.     Mental Status: He is oriented to person, place, and time.  Psychiatric:        Mood and Affect: Mood normal.        Behavior: Behavior normal.    ED Results / Procedures / Treatments   Labs (all labs ordered are listed, but only abnormal results are displayed) Labs Reviewed  URINALYSIS, ROUTINE W REFLEX MICROSCOPIC - Abnormal; Notable for the following components:      Result Value   Color, Urine STRAW (*)    Nitrite POSITIVE (*)    Bacteria, UA RARE (*)    All other components within normal limits  BASIC METABOLIC PANEL - Abnormal; Notable for the following components:   Glucose, Bld 63 (*)    All other components within normal limits  CBC - Abnormal; Notable for the following components:   RDW 15.6 (*)    All other components within normal limits  PROTIME-INR - Abnormal; Notable for the following components:   Prothrombin Time 22.9 (*)    INR 2.0 (*)    All other components within normal limits  CBG MONITORING, ED - Abnormal; Notable for the following components:   Glucose-Capillary 113 (*)    All  other components within normal limits  RESP PANEL BY RT-PCR (FLU A&B, COVID) ARPGX2  DIFFERENTIAL  TSH  CBC WITH DIFFERENTIAL/PLATELET  T4, FREE    EKG None  Radiology No results found.  Procedures Procedures   Medications Ordered in ED Medications - No data to display  ED Course  I have reviewed the triage vital signs and the nursing notes.  Pertinent labs & imaging results that were available during my care of the patient were reviewed by me and considered in my medical decision making (see chart for details).    MDM Rules/Calculators/A&P                           Jared Stevens is a 70 y.o. male here presenting with fatigue.  Considered dehydration versus anemia versus hypoglycemia versus electrolyte abnormality. Patient did not eat any lunch and just had his breakfast.  Denies any vomiting or fevers.  Plan to get CBC and BMP and urinalysis.   8:15 PM Initial CBG was 63.  Patient given some food and CBG increased to 113.  Patient is not diabetic.  Hemoglobin is stable.  Patient's COVID test is negative.  I suspect some fatigue is secondary to hypoglycemia from not eating much.  Told patient to stay hydrated and eat normally.  Stable for discharge   Final Clinical Impression(s) / ED Diagnoses Final diagnoses:  None    Rx / DC Orders ED Discharge Orders     None        Drenda Freeze, MD 03/20/21 2017

## 2021-03-21 ENCOUNTER — Telehealth: Payer: Self-pay | Admitting: Nurse Practitioner

## 2021-03-21 NOTE — Telephone Encounter (Signed)
See note

## 2021-03-23 ENCOUNTER — Telehealth: Payer: Self-pay

## 2021-03-23 ENCOUNTER — Telehealth: Payer: Medicare Other

## 2021-03-23 NOTE — Telephone Encounter (Signed)
  Care Management   Follow Up Note   03/23/2021 Name: Jared Stevens MRN: [id number] DOB: Aug 13, 1950   Referred by: Flossie Buffy, NP Reason for referral : Chronic Care Management (Initial assessment)   An unsuccessful telephone outreach was attempted today. The patient was referred to the case management team for assistance with care management and care coordination.   Follow Up Plan: A HIPPA compliant phone message was left for the patient providing contact information and requesting a return call.   [name] RN,BSN,CCM RN Case Manager [place]  [phone]

## 2021-03-24 ENCOUNTER — Encounter: Payer: Self-pay | Admitting: Internal Medicine

## 2021-03-24 NOTE — Telephone Encounter (Signed)
Patient notified and verbalized understanding. 

## 2021-04-05 ENCOUNTER — Ambulatory Visit: Payer: Medicare Other

## 2021-04-05 ENCOUNTER — Other Ambulatory Visit: Payer: Self-pay

## 2021-04-05 ENCOUNTER — Ambulatory Visit (INDEPENDENT_AMBULATORY_CARE_PROVIDER_SITE_OTHER): Payer: Medicare Other

## 2021-04-05 DIAGNOSIS — Z7901 Long term (current) use of anticoagulants: Secondary | ICD-10-CM | POA: Diagnosis not present

## 2021-04-05 LAB — POCT INR: INR: 2.9 (ref 2.0–3.0)

## 2021-04-05 NOTE — Progress Notes (Signed)
    Pt here for INR check per Baldo Ash [name]-NP  Capillary stick done on left index finger. Pt tolerated stick well.  Goal INR = 2.0-3.0   Last INR = 2.0   Pt currently takes Coumadin 5 mg Mon.-Thurs. 7.5 mg Fri.-Sun.   Date of last Coumadin dose = 04/05/2021   Pt denies recent antibiotics, no dietary changes and no unusual bruising / bleeding.   INR today = 2.9   Pt advised per Dr. Bryan Lemma to continue current dose of medication (5 mg Mon.-Thurs. 7.5 mg Fri.-Sun.) Pt verbalized understanding and instructed to schedule follow up for I month.

## 2021-04-13 ENCOUNTER — Other Ambulatory Visit: Payer: Self-pay | Admitting: Family Medicine

## 2021-04-13 DIAGNOSIS — J449 Chronic obstructive pulmonary disease, unspecified: Secondary | ICD-10-CM

## 2021-04-19 ENCOUNTER — Ambulatory Visit: Payer: Medicare Other | Admitting: Internal Medicine

## 2021-05-04 ENCOUNTER — Ambulatory Visit (INDEPENDENT_AMBULATORY_CARE_PROVIDER_SITE_OTHER): Payer: Medicare Other | Admitting: Gastroenterology

## 2021-05-04 ENCOUNTER — Encounter: Payer: Self-pay | Admitting: Gastroenterology

## 2021-05-04 VITALS — BP 100/64 | HR 76 | Ht 73.0 in | Wt 134.8 lb

## 2021-05-04 DIAGNOSIS — Z1211 Encounter for screening for malignant neoplasm of colon: Secondary | ICD-10-CM | POA: Diagnosis not present

## 2021-05-04 DIAGNOSIS — Z8673 Personal history of transient ischemic attack (TIA), and cerebral infarction without residual deficits: Secondary | ICD-10-CM

## 2021-05-04 DIAGNOSIS — I5042 Chronic combined systolic (congestive) and diastolic (congestive) heart failure: Secondary | ICD-10-CM

## 2021-05-04 DIAGNOSIS — Z85118 Personal history of other malignant neoplasm of bronchus and lung: Secondary | ICD-10-CM | POA: Diagnosis not present

## 2021-05-04 DIAGNOSIS — Z1212 Encounter for screening for malignant neoplasm of rectum: Secondary | ICD-10-CM

## 2021-05-04 NOTE — Progress Notes (Signed)
HPI : Jared Stevens is a very pleasant 70 year old male with a history of lung cancer, CAD with severe cardiomyopathy and CVA who is referred to [place] by Wilfred Lacy, NP for screening colonoscopy.  The patient tells me that he had a screening colonoscopy in April 2018 around the same time he was being treated for his lung cancer.  He does not recall the name of the doctor who performed the procedure, but knows it was in [place], [place], likely affiliated with [facility], which is where he received his lung cancer care.  That was his only colonoscopy and he tells me it was normal. He denies any chronic GI symptoms to include abdominal pain, constipation or diarrhea.  No blood in stool.  He also denies upper GI symptoms such as frequent heartburn, acid regurgitation, dysphagia, nausea/vomiting, early satiety.  Weight has been stable. He is coumadin, presumably due to his low EF (was 15% in May 2020) and was apparently not an ICD candidate due to other comorbidities.  A repeat echo in Aug 2020 showed modest improvement in EF to 20-25%. He denies significant symptoms some CHF.  Denies orthopnea or LE swelling.  He walks at least a mile daily for exercise. He has no family history of colon cancer.   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   Tx with tPA and thrombectomy // Coumadin (managed by PCP)   Hx of recurrent R pleural effusion    s/p thoracentesis   Hypercholesteremia    Long  term (current) use of anticoagulants 01/08/2020   Lung cancer ([place]) 03/2016   Mixed Ischemic and Non-Ischemic Cardiomyopathy    Non-small cell carcinoma of lung    Nonsustained ventricular tachycardia      Past Surgical History:  Procedure Laterality Date   Arm Surgery     HERNIA REPAIR     IR ANGIO INTRA EXTRACRAN SEL COM CAROTID INNOMINATE UNI L MOD SED  10/21/2018   IR ANGIO VERTEBRAL SEL SUBCLAVIAN INNOMINATE UNI R MOD SED  10/21/2018   IR CT HEAD LTD  10/21/2018   IR PERCUTANEOUS ART THROMBECTOMY/INFUSION INTRACRANIAL INC DIAG ANGIO  10/21/2018   IR THORACENTESIS ASP PLEURAL SPACE W/IMG GUIDE  09/27/2018   RADIOLOGY WITH ANESTHESIA N/A 10/21/2018   Procedure: RADIOLOGY WITH ANESTHESIA;  Surgeon: Luanne Bras, MD;  Location: [facility];  Service: Radiology;  Laterality: N/A;   REPLANTATION THUMB     RIGHT/LEFT HEART CATH AND CORONARY ANGIOGRAPHY N/A 10/01/2018   Procedure: RIGHT/LEFT HEART CATH AND CORONARY ANGIOGRAPHY;  Surgeon: Troy Sine, MD;  Location: [facility];  Service: Cardiovascular;  Laterality: N/A;   Family History  Problem Relation Age of Onset   Aneurysm Mother    Colon polyps Neg Hx    Colon cancer Neg Hx    Social History   Tobacco Use   Smoking status: Former    Packs/day:  0.50    Years: 15.00    Pack years: 7.50    Types: Cigarettes   Smokeless tobacco: Never  Vaping Use   Vaping Use: Never used  Substance Use Topics   Alcohol use: Not Currently    Alcohol/week: 2.0 standard drinks    Types: 2 Cans of beer per week    Comment: 1-2 beers weekly   Drug use: Not Currently   Current Outpatient Medications  Medication Sig Dispense Refill   albuterol (VENTOLIN HFA) 108 (90 Base) MCG/ACT inhaler Inhale 2 puffs into the lungs every 6 (six) hours as needed for wheezing or shortness of breath. Patient states he takes only as needed.     amitriptyline (ELAVIL) 50 MG tablet Take 50 mg by mouth at bedtime as needed.      budesonide-formoterol  (SYMBICORT) 160-4.5 MCG/ACT inhaler INAHLE 2 PUFFS BY MOUTH TWICE A DAY IN THE MORNING AND EVENING 10.2 each 5   carvedilol (COREG) 3.125 MG tablet Take 1 tablet (3.125 mg total) by mouth 2 (two) times daily with a meal. 180 tablet 3   Cyanocobalamin (B-12) 2500 MCG SUBL Place under the tongue.     digoxin (LANOXIN) 0.125 MG tablet Take 1 tablet (0.125 mg total) by mouth daily. 90 tablet 3   finasteride (PROSCAR) 5 MG tablet Take 5 mg by mouth daily.     latanoprost (XALATAN) 0.005 % ophthalmic solution Place 1 drop into both eyes daily.  11   rosuvastatin (CRESTOR) 40 MG tablet Take 1 tablet (40 mg total) by mouth daily. 90 tablet 3   spironolactone (ALDACTONE) 25 MG tablet Take 0.5 tablets (12.5 mg total) by mouth every other day. 45 tablet 1   tamsulosin (FLOMAX) 0.4 MG CAPS capsule Take 0.4 mg by mouth at bedtime.     warfarin (COUMADIN) 5 MG tablet Monday-Thursday take 1tab daily and Friday-Sunday take 1.5tab daily 34 tablet 5   No current facility-administered medications for this visit.   No Known Allergies   Review of Systems: All systems reviewed and negative except where noted in HPI.    No results found.  Physical Exam: BP 100/64   Pulse 76   Ht 6\' 1"  (1.854 m)   Wt 134 lb 12.8 oz (61.1 kg)   BMI 17.78 kg/m  Constitutional: Pleasant, thin African American male in no acute distress. HEENT: Normocephalic and atraumatic. Conjunctivae are normal. No scleral icterus. Neck supple.  Cardiovascular: Normal rate, regular rhythm.  Pulmonary/chest: Effort normal and breath sounds normal. No wheezing, rales or rhonchi. Abdominal: Soft, nondistended, nontender. Bowel sounds active throughout. There are no masses palpable. No hepatomegaly. Extremities: no edema Neurological: Alert and oriented to person place and time. Skin: Skin is warm and dry. No rashes noted. Psychiatric: Normal mood and affect. Behavior is normal.  CBC    Component Value Date/Time   WBC 8.0 03/20/2021  1846   RBC 5.10 03/20/2021 1846   HGB 15.1 03/20/2021 1846   HGB 15.1 10/20/2020 0843   HGB 14.4 01/08/2019 1018   HCT 45.9 03/20/2021 1846   HCT 42.7 01/08/2019 1018   PLT 327 03/20/2021 1846   PLT 295 10/20/2020 0843   PLT 348 01/08/2019 1018   MCV 90.0 03/20/2021 1846   MCV 86 01/08/2019 1018   MCH 29.6 03/20/2021 1846   MCHC 32.9 03/20/2021 1846   RDW 15.6 (H) 03/20/2021 1846   RDW 15.4 01/08/2019 1018   LYMPHSABS 1.4 03/20/2021 1846   MONOABS 0.5 03/20/2021 1846   EOSABS 0.2 03/20/2021  1846   BASOSABS 0.1 03/20/2021 1846    CMP     Component Value Date/Time   NA 136 03/20/2021 1455   NA 141 08/03/2020 0947   K 4.4 03/20/2021 1455   CL 105 03/20/2021 1455   CO2 23 03/20/2021 1455   GLUCOSE 63 (L) 03/20/2021 1455   BUN 17 03/20/2021 1455   BUN 17 08/03/2020 0947   CREATININE 0.72 03/20/2021 1455   CREATININE 0.95 10/20/2020 0843   CALCIUM 9.1 03/20/2021 1455   PROT 7.1 10/20/2020 0843   PROT 6.6 08/03/2020 0947   ALBUMIN 3.6 10/20/2020 0843   ALBUMIN 3.8 08/03/2020 0947   AST 22 10/20/2020 0843   ALT 16 11/05/2020 0734   ALT 25 10/20/2020 0843   ALKPHOS 76 10/20/2020 0843   BILITOT 0.4 10/20/2020 0843   GFRNONAA >60 03/20/2021 1455   GFRNONAA >60 10/20/2020 0843   GFRAA >60 02/12/2020 1127     ASSESSMENT AND PLAN: 70 year old male with significant cardiopulmonary comorbidities as mentioned above, referred for screening colonoscopy.  Patient states he had a colonoscopy in 2018 which was normal and he is not sure why he was referred again so early.  He denies any GI symptoms or unintentional weight loss.   Will attempt to obtain prior colonoscopy report (may be difficult given that patient is not sure of providers/practice name).  If no colonoscopy report is obtained, I would lean against repeat colonoscopy at this time, given lack of any symptoms,  patient's significant cardiopulmonary comorbidities and the patient's fairly certain recollection of a normal  colonoscopy in 2018.  Will get repeat echo, so that if we do decide to repeat colonoscopy, we will have an updated EF (if still<30%, will need to procedure in hospital)  CRC screening - Obtain colonoscopy report from 2018 - Higher threshold for repeat colonoscopy even if report not able to obtained, given patient's significant cardiopulmonary comorbidities and high peri-procedural risk.  Cardiomyopathy - Repeat TTE  Ashyra Cantin E. Candis Schatz, MD [facility]   [name], [name], NP

## 2021-05-04 NOTE — Patient Instructions (Signed)
If you are age 70 or older, your body mass index should be between 23-30. Your Body mass index is 17.78 kg/m. If this is out of the aforementioned range listed, please consider follow up with your Primary Care Provider.  If you are age 51 or younger, your body mass index should be between 19-25. Your Body mass index is 17.78 kg/m. If this is out of the aformentioned range listed, please consider follow up with your Primary Care Provider.   We will call you once we have scheduled your Echocardiogram.  The [facility] providers would like to encourage you to use [facility] to communicate with providers for non-urgent requests or questions.  Due to long hold times on the telephone, sending your provider a message by [facility] may be a faster and more efficient way to get a response.  Please allow 48 business hours for a response.  Please remember that this is for non-urgent requests.   It was a pleasure to see you today!  Thank you for trusting me with your gastrointestinal care!    Scott E.Candis Schatz, MD

## 2021-05-05 ENCOUNTER — Ambulatory Visit (INDEPENDENT_AMBULATORY_CARE_PROVIDER_SITE_OTHER): Payer: Medicare Other

## 2021-05-05 ENCOUNTER — Other Ambulatory Visit: Payer: Self-pay

## 2021-05-05 DIAGNOSIS — Z7901 Long term (current) use of anticoagulants: Secondary | ICD-10-CM | POA: Diagnosis not present

## 2021-05-05 LAB — POCT INR: INR: 1.7 — AB (ref 2.0–3.0)

## 2021-05-05 NOTE — Progress Notes (Signed)
Pt here for INR check per  Wilfred Lacy, NP.  Goal INR =2.0- 3.0  Last INR =2.9  Pt currently takes Coumadin 5 mg Monday - Thursday. And 7.5 mg Friday - Sunday.    Date of last Coumadin dose = 05/05/2021  Pt denies recent antibiotics, no dietary changes and no unusual bruising / bleeding.  INR today = 1.7  Pt advised per  Wilfred Lacy, to take an extra 5 mg tab today, then take 2 tablets on Friday, then resume regular dosing of Monday- Thursday 5 mg (1 tab) and Friday - Sunday 1.5 tabs (7.5 mg).   Scheduled for return appointment on 05/19/2021 to recheck INR.  Dm/cma

## 2021-05-08 ENCOUNTER — Encounter: Payer: Self-pay | Admitting: Gastroenterology

## 2021-05-11 ENCOUNTER — Encounter: Payer: Self-pay | Admitting: Cardiovascular Disease

## 2021-05-11 ENCOUNTER — Ambulatory Visit (INDEPENDENT_AMBULATORY_CARE_PROVIDER_SITE_OTHER): Payer: Medicare Other | Admitting: Cardiovascular Disease

## 2021-05-11 ENCOUNTER — Other Ambulatory Visit: Payer: Self-pay

## 2021-05-11 VITALS — BP 106/68 | HR 61 | Ht 73.0 in | Wt 136.8 lb

## 2021-05-11 DIAGNOSIS — I5042 Chronic combined systolic (congestive) and diastolic (congestive) heart failure: Secondary | ICD-10-CM

## 2021-05-11 DIAGNOSIS — I2510 Atherosclerotic heart disease of native coronary artery without angina pectoris: Secondary | ICD-10-CM | POA: Insufficient documentation

## 2021-05-11 NOTE — Progress Notes (Signed)
Cardiology Office Note:    Date:  05/11/2021   ID:  Jared Stevens, DOB Apr 28, 1951, MRN [id number]  PCP:  Flossie Buffy, NP  [facility] Cardiologist:  Mertie Moores, MD  [facility] Electrophysiologist:  None   Referring MD: Flossie Buffy, NP   Chief Complaint  Patient presents with   Congestive Heart Failure         History of Present Illness:    Jared Stevens is a 70 y.o. 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  He has a history of coronary artery disease with a occluded right coronary artery by heart catheterization in May, 2020.  He has a history of COPD, hyperlipidemia, stage IIIa non-small cell lung cancer with recurrent right pleural effusion.  He has a history of repetitive cocaine use.  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 He is on Coumadin which is managed by [employer].  He was originally seen  by Dr. Rayann Heman who felt that he was not a candidate for ICD. His most recent echocardiogram from August, 2020 reveals slightly improved LV function with an EF of 20 to 25%.  He has grade 3 diastolic dysfunction.  No cp or dyspnea.  INR has been well controlled.  He has no CP or dyspnea.   Very well compensated.    Dec. 14, 2022: Jared Stevens is seen for follow up visit for his combined CHF  He never had his hernia surgery  Hx of non small cell lung ca  No Cp or dyspnea   Still smokes occasionally   His primary doctor manages his coumadin ( started for stroke)     Past Medical History:  Diagnosis Date   Acute systolic CHF (congestive heart failure) ([place])    2D ECHO 09/27/2018 revealed left ventricular ejection fraction of 15%   CHF exacerbation ([place]) 09/27/2018   Chronic obstructive pulmonary disease    Combined systolic and diastolic CHF    Mixed Ischemic/Non-Ischemic CM // Echo 09/2018: EF 15 // cMRI 10/2018: EF 11, inf scar // Echo 12/2018: EF  20-25, Gr 3 DD // Not a candidate for ICD due to comorbid illnesses    Coronary artery disease    cath 09/2018: RCA chronically occluded >> Med Rx   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   Tx with tPA and thrombectomy // Coumadin (managed by PCP)   Hx of recurrent R pleural effusion    s/p thoracentesis   Hypercholesteremia    Long term (current) use of anticoagulants 01/08/2020   Lung cancer ([place]) 03/2016   Mixed Ischemic and Non-Ischemic Cardiomyopathy    Non-small cell carcinoma of lung    Nonsustained ventricular tachycardia     Past Surgical History:  Procedure Laterality Date   Arm Surgery     HERNIA REPAIR     IR ANGIO INTRA EXTRACRAN SEL COM CAROTID INNOMINATE UNI L MOD SED  10/21/2018   IR ANGIO VERTEBRAL SEL SUBCLAVIAN INNOMINATE UNI R MOD SED  10/21/2018   IR CT HEAD LTD  10/21/2018   IR PERCUTANEOUS ART THROMBECTOMY/INFUSION INTRACRANIAL INC DIAG ANGIO  10/21/2018   IR THORACENTESIS ASP PLEURAL SPACE W/IMG GUIDE  09/27/2018   RADIOLOGY WITH ANESTHESIA N/A 10/21/2018   Procedure: RADIOLOGY WITH ANESTHESIA;  Surgeon: Luanne Bras, MD;  Location: [facility];  Service: Radiology;  Laterality: N/A;   REPLANTATION THUMB     RIGHT/LEFT HEART CATH AND CORONARY ANGIOGRAPHY N/A 10/01/2018  Procedure: RIGHT/LEFT HEART CATH AND CORONARY ANGIOGRAPHY;  Surgeon: Troy Sine, MD;  Location: [facility];  Service: Cardiovascular;  Laterality: N/A;    Current Medications: Current Meds  Medication Sig   albuterol (VENTOLIN HFA) 108 (90 Base) MCG/ACT inhaler Inhale 2 puffs into the lungs every 6 (six) hours as needed for wheezing or shortness of breath. Patient states he takes only as needed.   amitriptyline (ELAVIL) 50 MG tablet Take 50 mg by mouth at bedtime as needed.    budesonide-formoterol (SYMBICORT) 160-4.5 MCG/ACT inhaler INAHLE 2 PUFFS BY MOUTH TWICE A DAY IN THE MORNING AND EVENING   carvedilol (COREG) 3.125 MG tablet Take 1 tablet (3.125 mg total) by mouth 2 (two) times daily with a  meal.   Cyanocobalamin (B-12) 2500 MCG SUBL Place under the tongue.   digoxin (LANOXIN) 0.125 MG tablet Take 1 tablet (0.125 mg total) by mouth daily.   finasteride (PROSCAR) 5 MG tablet Take 5 mg by mouth daily.   latanoprost (XALATAN) 0.005 % ophthalmic solution Place 1 drop into both eyes daily.   rosuvastatin (CRESTOR) 40 MG tablet Take 1 tablet (40 mg total) by mouth daily.   spironolactone (ALDACTONE) 25 MG tablet Take 0.5 tablets (12.5 mg total) by mouth every other day.   tamsulosin (FLOMAX) 0.4 MG CAPS capsule Take 0.4 mg by mouth at bedtime.   warfarin (COUMADIN) 5 MG tablet Monday-Thursday take 1tab daily and Friday-Sunday take 1.5tab daily     Allergies:   Patient has no known allergies.   Social History   Socioeconomic History   Marital status: Widowed    Spouse name: Not on file   Number of children: Not on file   Years of education: Not on file   Highest education level: Not on file  Occupational History   Not on file  Tobacco Use   Smoking status: Former    Packs/day: 0.50    Years: 15.00    Pack years: 7.50    Types: Cigarettes   Smokeless tobacco: Never  Vaping Use   Vaping Use: Never used  Substance and Sexual Activity   Alcohol use: Not Currently    Alcohol/week: 2.0 standard drinks    Types: 2 Cans of beer per week    Comment: 1-2 beers weekly   Drug use: Not Currently   Sexual activity: Not Currently  Other Topics Concern   Not on file  Social History Narrative   Not on file   Social Determinants of Health   Financial Resource Strain: Not on file  Food Insecurity: No Food Insecurity   Worried About Running Out of Food in the Last Year: Never true   [place] in the Last Year: Never true  Transportation Needs: No Transportation Needs   Lack of Transportation (Medical): No   Lack of Transportation (Non-Medical): No  Physical Activity: Not on file  Stress: Not on file  Social Connections: Not on file     Family History: The patient's  family history includes Aneurysm in his mother. There is no history of Colon polyps or Colon cancer.  ROS:   Please see the history of present illness.     All other systems reviewed and are negative.  EKGs/Labs/Other Studies Reviewed:    The following studies were reviewed today:   EKG:  Feb , 21, 2021:   NSR , no ST or T wave changes.   Recent Labs: 11/05/2020: ALT 16 03/20/2021: BUN 17; Creatinine, Ser 0.72; Hemoglobin 15.1;  Platelets 327; Potassium 4.4; Sodium 136; TSH 0.557  Recent Lipid Panel    Component Value Date/Time   CHOL 138 11/05/2020 0734   TRIG 42 11/05/2020 0734   HDL 58 11/05/2020 0734   CHOLHDL 2.4 11/05/2020 0734   CHOLHDL 3.2 10/22/2018 0425   VLDL 17 10/22/2018 0425   LDLCALC 70 11/05/2020 0734    Physical Exam:    Physical Exam: Blood pressure 106/68, pulse 61, height 6\' 1"  (1.854 m), weight 136 lb 12.8 oz (62.1 kg), SpO2 96 %.  GEN:  Well nourished, well developed in no acute distress HEENT: Normal NECK: No JVD; No carotid bruits LYMPHATICS: No lymphadenopathy CARDIAC: RRR , no murmurs, rubs, gallops RESPIRATORY:  Clear to auscultation without rales, wheezing or rhonchi  ABDOMEN: Soft, non-tender, non-distended MUSCULOSKELETAL:  No edema; No deformity  SKIN: Warm and dry NEUROLOGIC:  Alert and oriented x 3   ASSESSMENT:    1. Chronic combined systolic and diastolic CHF (congestive heart failure) (HCC)    PLAN:     Chronic combined systolic / diastolic CHF:   He Has known chronic combined systolic and diastolic congestive heart failure.  His ejection fraction has improved slightly and is now 20 to 25%.  He has no complaints . Cont current meds.    2.  Coronary artery disease: He is stable.  He has an occluded RCA.  No medicine changes.  3.  Hx of CVA :   Having memory issues  Is on coumadin .    Managed by his primary medical doctor        Medication Adjustments/Labs and Tests Ordered: Current medicines are reviewed at length  with the patient today.  Concerns regarding medicines are outlined above.  No orders of the defined types were placed in this encounter.  No orders of the defined types were placed in this encounter.   Patient Instructions  Medication Instructions:   Your physician recommends that you continue on your current medications as directed. Please refer to the Current Medication list given to you today.  *If you need a refill on your cardiac medications before your next appointment, please call your pharmacy*    Follow-Up: At [facility], you and your health needs are our priority.  As part of our continuing mission to provide you with exceptional heart care, we have created designated Provider Care Teams.  These Care Teams include your primary Cardiologist (physician) and Advanced Practice Providers (APPs -  Physician Assistants and Nurse Practitioners) who all work together to provide you with the care you need, when you need it.  We recommend signing up for the patient portal called "MyChart".  Sign up information is provided on this After Visit Summary.  MyChart is used to connect with patients for Virtual Visits (Telemedicine).  Patients are able to view lab/test results, encounter notes, upcoming appointments, etc.  Non-urgent messages can be sent to your provider as well.   To learn more about what you can do with MyChart, go to [email or web address].    Your next appointment:   1 year(s)  The format for your next appointment:   In Person  Provider:   [facility] PA-C {    Signed, Mertie Moores, MD  05/11/2021 4:59 PM    [place]

## 2021-05-11 NOTE — Patient Instructions (Signed)
Medication Instructions:   Your physician recommends that you continue on your current medications as directed. Please refer to the Current Medication list given to you today.  *If you need a refill on your cardiac medications before your next appointment, please call your pharmacy*    Follow-Up: At [facility], you and your health needs are our priority.  As part of our continuing mission to provide you with exceptional heart care, we have created designated Provider Care Teams.  These Care Teams include your primary Cardiologist (physician) and Advanced Practice Providers (APPs -  Physician Assistants and Nurse Practitioners) who all work together to provide you with the care you need, when you need it.  We recommend signing up for the patient portal called "MyChart".  Sign up information is provided on this After Visit Summary.  MyChart is used to connect with patients for Virtual Visits (Telemedicine).  Patients are able to view lab/test results, encounter notes, upcoming appointments, etc.  Non-urgent messages can be sent to your provider as well.   To learn more about what you can do with MyChart, go to [email or web address].    Your next appointment:   1 year(s)  The format for your next appointment:   In Person  Provider:   Richardson Dopp PA-C {

## 2021-05-12 ENCOUNTER — Telehealth: Payer: Self-pay | Admitting: Nurse Practitioner

## 2021-05-12 DIAGNOSIS — J449 Chronic obstructive pulmonary disease, unspecified: Secondary | ICD-10-CM

## 2021-05-12 NOTE — Telephone Encounter (Signed)
[facility] calling for refill on  Albuterol Furosimide  Callback for pharmacy if needed is [phone]

## 2021-05-13 ENCOUNTER — Telehealth: Payer: Self-pay | Admitting: Cardiovascular Disease

## 2021-05-13 DIAGNOSIS — I5042 Chronic combined systolic (congestive) and diastolic (congestive) heart failure: Secondary | ICD-10-CM

## 2021-05-13 MED ORDER — ALBUTEROL SULFATE HFA 108 (90 BASE) MCG/ACT IN AERS: 1.0000 | INHALATION_SPRAY | Freq: Four times a day (QID) | RESPIRATORY_TRACT | 0 refills | Status: AC | PRN

## 2021-05-13 MED ORDER — FUROSEMIDE 40 MG PO TABS: 40.0000 mg | ORAL_TABLET | Freq: Every day | ORAL | 3 refills | Status: DC

## 2021-05-13 NOTE — Telephone Encounter (Signed)
Per review of Pt chart-furosemide was removed from Pt's med list at last PCP visit.  Per Dr. Lenord Fellers lasix 40 mg one tablet by mouth daily  Per Dr. Clent Ridges BMP in 2-3 weeks.

## 2021-05-13 NOTE — Telephone Encounter (Signed)
Patient called to get furosemide refilled. Not showing where it is currently active. Please call patient to discuss medication being d/c'd

## 2021-05-13 NOTE — Telephone Encounter (Signed)
Pt called requesting Furosimide

## 2021-05-13 NOTE — Telephone Encounter (Signed)
Patient notified and verbalized understanding. 

## 2021-05-16 NOTE — Telephone Encounter (Signed)
Patient notified. He will come in for lab work on June 01, 2021

## 2021-05-19 ENCOUNTER — Ambulatory Visit (INDEPENDENT_AMBULATORY_CARE_PROVIDER_SITE_OTHER): Payer: Medicare Other

## 2021-05-19 ENCOUNTER — Other Ambulatory Visit: Payer: Self-pay

## 2021-05-19 DIAGNOSIS — Z7901 Long term (current) use of anticoagulants: Secondary | ICD-10-CM

## 2021-05-19 LAB — POCT INR: INR: 3 (ref 2.0–3.0)

## 2021-05-19 NOTE — Progress Notes (Signed)
Pt here for INR check per Wilfred Lacy, NP  Goal INR =2.0- 3.0  Last INR =1.7  Pt currently takes Coumadin   Date of last Coumadin dose = 05/19/2021  Pt denies recent antibiotics, no dietary changes and no unusual bruising / bleeding.  INR today = 3.0  Pt advised per Wilfred Lacy, to take an extra 5 mg tab today, then take 2 tablets on Friday, then resume regular dosing of Monday- Thursday 5 mg (1 tab) and Friday - Sunday 1.5 tabs (7.5 mg).   Scheduled for return in 1 month appointment on 06/22/2020 @2PM  to recheck INR. Sw, cma

## 2021-06-01 ENCOUNTER — Other Ambulatory Visit: Payer: Self-pay

## 2021-06-01 ENCOUNTER — Other Ambulatory Visit: Payer: Commercial Managed Care - HMO | Admitting: *Deleted

## 2021-06-01 DIAGNOSIS — I5042 Chronic combined systolic (congestive) and diastolic (congestive) heart failure: Secondary | ICD-10-CM

## 2021-06-02 LAB — BASIC METABOLIC PANEL
BUN/Creatinine Ratio: 20 (ref 10–24)
BUN: 17 mg/dL (ref 8–27)
CO2: 25 mmol/L (ref 20–29)
Calcium: 9.4 mg/dL (ref 8.6–10.2)
Chloride: 99 mmol/L (ref 96–106)
Creatinine, Ser: 0.86 mg/dL (ref 0.76–1.27)
Glucose: 192 mg/dL — ABNORMAL HIGH (ref 70–99)
Potassium: 3.6 mmol/L (ref 3.5–5.2)
Sodium: 138 mmol/L (ref 134–144)
eGFR: 93 mL/min/{1.73_m2} (ref >59)

## 2021-06-13 ENCOUNTER — Other Ambulatory Visit (HOSPITAL_COMMUNITY): Payer: Medicare Other

## 2021-06-14 ENCOUNTER — Ambulatory Visit (INDEPENDENT_AMBULATORY_CARE_PROVIDER_SITE_OTHER): Payer: Commercial Managed Care - HMO

## 2021-06-14 DIAGNOSIS — Z0000 Encounter for general adult medical examination without abnormal findings: Secondary | ICD-10-CM

## 2021-06-14 NOTE — Patient Instructions (Signed)
Jared Stevens , Thank you for taking time to come for your Medicare Wellness Visit. I appreciate your ongoing commitment to your health goals. Please review the following plan we discussed and let me know if I can assist you in the future.   Screening recommendations/referrals: Colonoscopy: scheduled 07/06/2021 Recommended yearly ophthalmology/optometry visit for glaucoma screening and checkup Recommended yearly dental visit for hygiene and checkup  Vaccinations: Influenza vaccine: declined  Pneumococcal vaccine: will consider  Tdap vaccine: due  Shingles vaccine: declined     Advanced directives: yes   Conditions/risks identified: none   Next appointment: none   Preventive Care 71 Years and Older, Male Preventive care refers to lifestyle choices and visits with your health care provider that can promote health and wellness. What does preventive care include? A yearly physical exam. This is also called an annual well check. Dental exams once or twice a year. Routine eye exams. Ask your health care provider how often you should have your eyes checked. Personal lifestyle choices, including: Daily care of your teeth and gums. Regular physical activity. Eating a healthy diet. Avoiding tobacco and drug use. Limiting alcohol use. Practicing safe sex. Taking low doses of aspirin every day. Taking vitamin and mineral supplements as recommended by your health care provider. What happens during an annual well check? The services and screenings done by your health care provider during your annual well check will depend on your age, overall health, lifestyle risk factors, and family history of disease. Counseling  Your health care provider may ask you questions about your: Alcohol use. Tobacco use. Drug use. Emotional well-being. Home and relationship well-being. Sexual activity. Eating habits. History of falls. Memory and ability to understand (cognition). Work and work  Statistician. Screening  You may have the following tests or measurements: Height, weight, and BMI. Blood pressure. Lipid and cholesterol levels. These may be checked every 5 years, or more frequently if you are over 27 years old. Skin check. Lung cancer screening. You may have this screening every year starting at age 53 if you have a 30-pack-year history of smoking and currently smoke or have quit within the past 15 years. Fecal occult blood test (FOBT) of the stool. You may have this test every year starting at age 37. Flexible sigmoidoscopy or colonoscopy. You may have a sigmoidoscopy every 5 years or a colonoscopy every 10 years starting at age 69. Prostate cancer screening. Recommendations will vary depending on your family history and other risks. Hepatitis C blood test. Hepatitis B blood test. Sexually transmitted disease (STD) testing. Diabetes screening. This is done by checking your blood sugar (glucose) after you have not eaten for a while (fasting). You may have this done every 1-3 years. Abdominal aortic aneurysm (AAA) screening. You may need this if you are a current or former smoker. Osteoporosis. You may be screened starting at age 18 if you are at high risk. Talk with your health care provider about your test results, treatment options, and if necessary, the need for more tests. Vaccines  Your health care provider may recommend certain vaccines, such as: Influenza vaccine. This is recommended every year. Tetanus, diphtheria, and acellular pertussis (Tdap, Td) vaccine. You may need a Td booster every 10 years. Zoster vaccine. You may need this after age 38. Pneumococcal 13-valent conjugate (PCV13) vaccine. One dose is recommended after age 17. Pneumococcal polysaccharide (PPSV23) vaccine. One dose is recommended after age 55. Talk to your health care provider about which screenings and vaccines you need and how  often you need them. This information is not intended to replace  advice given to you by your health care provider. Make sure you discuss any questions you have with your health care provider. Document Released: 06/11/2015 Document Revised: 02/02/2016 Document Reviewed: 03/16/2015 Elsevier Interactive Patient Education  2017 [employer] Prevention in the Home Falls can cause injuries. They can happen to people of all ages. There are many things you can do to make your home safe and to help prevent falls. What can I do on the outside of my home? Regularly fix the edges of walkways and driveways and fix any cracks. Remove anything that might make you trip as you walk through a door, such as a raised step or threshold. Trim any bushes or trees on the path to your home. Use bright outdoor lighting. Clear any walking paths of anything that might make someone trip, such as rocks or tools. Regularly check to see if handrails are loose or broken. Make sure that both sides of any steps have handrails. Any raised decks and porches should have guardrails on the edges. Have any leaves, snow, or ice cleared regularly. Use sand or salt on walking paths during winter. Clean up any spills in your garage right away. This includes oil or grease spills. What can I do in the bathroom? Use night lights. Install grab bars by the toilet and in the tub and shower. Do not use towel bars as grab bars. Use non-skid mats or decals in the tub or shower. If you need to sit down in the shower, use a plastic, non-slip stool. Keep the floor dry. Clean up any water that spills on the floor as soon as it happens. Remove soap buildup in the tub or shower regularly. Attach bath mats securely with double-sided non-slip rug tape. Do not have throw rugs and other things on the floor that can make you trip. What can I do in the bedroom? Use night lights. Make sure that you have a light by your bed that is easy to reach. Do not use any sheets or blankets that are too big for your bed.  They should not hang down onto the floor. Have a firm chair that has side arms. You can use this for support while you get dressed. Do not have throw rugs and other things on the floor that can make you trip. What can I do in the kitchen? Clean up any spills right away. Avoid walking on wet floors. Keep items that you use a lot in easy-to-reach places. If you need to reach something above you, use a strong step stool that has a grab bar. Keep electrical cords out of the way. Do not use floor polish or wax that makes floors slippery. If you must use wax, use non-skid floor wax. Do not have throw rugs and other things on the floor that can make you trip. What can I do with my stairs? Do not leave any items on the stairs. Make sure that there are handrails on both sides of the stairs and use them. Fix handrails that are broken or loose. Make sure that handrails are as long as the stairways. Check any carpeting to make sure that it is firmly attached to the stairs. Fix any carpet that is loose or worn. Avoid having throw rugs at the top or bottom of the stairs. If you do have throw rugs, attach them to the floor with carpet tape. Make sure that you have a  light switch at the top of the stairs and the bottom of the stairs. If you do not have them, ask someone to add them for you. What else can I do to help prevent falls? Wear shoes that: Do not have high heels. Have rubber bottoms. Are comfortable and fit you well. Are closed at the toe. Do not wear sandals. If you use a stepladder: Make sure that it is fully opened. Do not climb a closed stepladder. Make sure that both sides of the stepladder are locked into place. Ask someone to hold it for you, if possible. Clearly mark and make sure that you can see: Any grab bars or handrails. First and last steps. Where the edge of each step is. Use tools that help you move around (mobility aids) if they are needed. These  include: Canes. Walkers. Scooters. Crutches. Turn on the lights when you go into a dark area. Replace any light bulbs as soon as they burn out. Set up your furniture so you have a clear path. Avoid moving your furniture around. If any of your floors are uneven, fix them. If there are any pets around you, be aware of where they are. Review your medicines with your doctor. Some medicines can make you feel dizzy. This can increase your chance of falling. Ask your doctor what other things that you can do to help prevent falls. This information is not intended to replace advice given to you by your health care provider. Make sure you discuss any questions you have with your health care provider. Document Released: 03/11/2009 Document Revised: 10/21/2015 Document Reviewed: 06/19/2014 Elsevier Interactive Patient Education  2017 [employer].

## 2021-06-14 NOTE — Progress Notes (Signed)
Subjective:   Chau Sawin is a 71 y.o. male who presents for an Subsequent  Medicare Annual Wellness Visit.  I connected with [name] today by telephone and verified that I am speaking with the correct person using two identifiers. Location patient: home Location provider: work Persons participating in the virtual visit: patient, provider.   I discussed the limitations, risks, security and privacy concerns of performing an evaluation and management service by telephone and the availability of in person appointments. I also discussed with the patient that there may be a patient responsible charge related to this service. The patient expressed understanding and verbally consented to this telephonic visit.    Interactive audio and video telecommunications were attempted between this provider and patient, however failed, due to patient having technical difficulties OR patient did not have access to video capability.  We continued and completed visit with audio only.    Review of Systems     Cardiac Risk Factors include: advanced age (>9men, >102 women);dyslipidemia;male gender;hypertension     Objective:    Today's Vitals   There is no height or weight on file to calculate BMI.  Advanced Directives 06/14/2021 03/20/2021 02/16/2021 07/31/2020 07/28/2020 03/09/2020 10/30/2018  Does Patient Have a Medical Advance Directive? Yes No No No No Yes Yes  Type of Paramedic of [place];Living will - - - - Press photographer;Living will (No Data)  Does patient want to make changes to medical advance directive? - - - - - - No - Patient declined  Copy of [place] in Chart? No - copy requested - - - - No - copy requested -  Would patient like information on creating a medical advance directive? - - No - Patient declined No - Patient declined - - No - Patient declined    Current Medications (verified) Outpatient Encounter Medications as of  06/14/2021  Medication Sig   albuterol (VENTOLIN HFA) 108 (90 Base) MCG/ACT inhaler Inhale 1-2 puffs into the lungs every 6 (six) hours as needed for wheezing or shortness of breath. Patient states he takes only as needed.   amitriptyline (ELAVIL) 50 MG tablet Take 50 mg by mouth at bedtime as needed.    budesonide-formoterol (SYMBICORT) 160-4.5 MCG/ACT inhaler INAHLE 2 PUFFS BY MOUTH TWICE A DAY IN THE MORNING AND EVENING   carvedilol (COREG) 3.125 MG tablet Take 1 tablet (3.125 mg total) by mouth 2 (two) times daily with a meal.   Cyanocobalamin (B-12) 2500 MCG SUBL Place under the tongue.   digoxin (LANOXIN) 0.125 MG tablet Take 1 tablet (0.125 mg total) by mouth daily.   finasteride (PROSCAR) 5 MG tablet Take 5 mg by mouth daily.   furosemide (LASIX) 40 MG tablet Take 1 tablet (40 mg total) by mouth daily.   latanoprost (XALATAN) 0.005 % ophthalmic solution Place 1 drop into both eyes daily.   rosuvastatin (CRESTOR) 40 MG tablet Take 1 tablet (40 mg total) by mouth daily.   spironolactone (ALDACTONE) 25 MG tablet Take 0.5 tablets (12.5 mg total) by mouth every other day.   tamsulosin (FLOMAX) 0.4 MG CAPS capsule Take 0.4 mg by mouth at bedtime.   warfarin (COUMADIN) 5 MG tablet Monday-Thursday take 1tab daily and Friday-Sunday take 1.5tab daily   No facility-administered encounter medications on file as of 06/14/2021.    Allergies (verified) Patient has no known allergies.   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 // Coumadin (managed by PCP)   Hx of recurrent R pleural effusion    s/p thoracentesis   Hypercholesteremia    Long term (current) use of anticoagulants 01/08/2020   Lung cancer ([place]) 03/2016   Mixed Ischemic and Non-Ischemic Cardiomyopathy    Non-small cell carcinoma of lung    Nonsustained ventricular tachycardia    Past Surgical History:  Procedure Laterality Date   Arm Surgery     HERNIA REPAIR     IR ANGIO INTRA EXTRACRAN SEL COM CAROTID INNOMINATE UNI L MOD SED  10/21/2018   IR ANGIO VERTEBRAL SEL SUBCLAVIAN INNOMINATE UNI R MOD SED  10/21/2018   IR CT HEAD LTD  10/21/2018   IR PERCUTANEOUS ART THROMBECTOMY/INFUSION INTRACRANIAL INC DIAG ANGIO  10/21/2018   IR THORACENTESIS ASP PLEURAL SPACE W/IMG GUIDE  09/27/2018   RADIOLOGY WITH ANESTHESIA N/A 10/21/2018   Procedure: RADIOLOGY WITH ANESTHESIA;  Surgeon: Luanne Bras, MD;  Location: [facility];  Service: Radiology;  Laterality: N/A;   REPLANTATION THUMB     RIGHT/LEFT HEART CATH AND CORONARY ANGIOGRAPHY N/A 10/01/2018   Procedure: RIGHT/LEFT HEART CATH AND CORONARY ANGIOGRAPHY;  Surgeon: Troy Sine, MD;  Location: [facility];  Service: Cardiovascular;  Laterality: N/A;   Family History  Problem Relation Age of Onset   Aneurysm Mother    Colon polyps Neg Hx    Colon cancer Neg Hx    Social History   Socioeconomic History   Marital status: Widowed    Spouse name: Not on file   Number of children: Not on file   Years of education: Not on file   Highest education level: Not on file  Occupational History   Not on file  Tobacco Use   Smoking status: Former    Packs/day: 0.50    Years: 15.00    Pack years: 7.50    Types: Cigarettes   Smokeless tobacco: Never  Vaping Use   Vaping Use: Never used  Substance and Sexual Activity   Alcohol use: Not Currently    Alcohol/week: 2.0 standard drinks    Types: 2 Cans of beer per  week    Comment: 1-2 beers weekly   Drug use: Not Currently   Sexual activity: Not Currently  Other Topics Concern   Not on file  Social History Narrative   Not on file   Social Determinants of Health   Financial Resource Strain: Low Risk    Difficulty of Paying Living Expenses: Not hard at all  Food Insecurity: No Food Insecurity   Worried About Charity fundraiser in the Last Year: Never true   [place] in the Last Year: Never true  Transportation Needs: No Transportation Needs   Lack of Transportation (Medical): No   Lack of Transportation (Non-Medical): No  Physical Activity: Sufficiently Active   Days of Exercise per Week: 7 days   Minutes of Exercise per Session:  30 min  Stress: No Stress Concern Present   Feeling of Stress : Not at all  Social Connections: Socially Isolated   Frequency of Communication with Friends and Family: Twice a week   Frequency of Social Gatherings with Friends and Family: Twice a week   Attends Religious Services: Never   Marine scientist or Organizations: No   Attends Archivist Meetings: Never   Marital Status: Widowed    Tobacco Counseling Counseling given: Not Answered   Clinical Intake:  Pre-visit preparation completed: Yes  Pain : No/denies pain     Nutritional Risks: None Diabetes: No  How often do you need to have someone help you when you read instructions, pamphlets, or other written materials from your doctor or pharmacy?: 1 - Never What is the last grade level you completed in school?: High School  Diabetic?no   Interpreter Needed?: No  Information entered by :: L.Towanna Avery,LPN   Activities of Daily Living In your present state of health, do you have any difficulty performing the following activities: 06/14/2021  Hearing? N  Vision? N  Difficulty concentrating or making decisions? N  Walking or climbing stairs? N  Dressing or bathing? N  Doing errands, shopping? N  Preparing Food and eating  ? N  Using the Toilet? N  In the past six months, have you accidently leaked urine? N  Do you have problems with loss of bowel control? N  Managing your Medications? N  Managing your Finances? N  Housekeeping or managing your Housekeeping? N  Some recent data might be hidden    Patient Care Team: [name], [name], NP as PCP - General (Internal Medicine) Nahser, Wonda Cheng, MD as PCP - Cardiology (Cardiology) Sharmon Revere as Physician Assistant (Cardiology) Dannielle Karvonen, RN as [place] any recent [place] you may have received from other than [facility] providers in the past year (date may be approximate).     Assessment:   This is a routine wellness examination for [place].  Hearing/Vision screen Vision Screening - Comments:: Annual eye exams wears glasses   Dietary issues and exercise activities discussed: Current Exercise Habits: Home exercise routine, Type of exercise: walking, Time (Minutes): 30, Frequency (Times/Week): 3, Weekly Exercise (Minutes/Week): 90, Intensity: Mild, Exercise limited by: respiratory conditions(s)   Goals Addressed             This Visit's Progress    Patient Stated   On track    Would like to gain some weight       Depression Screen PHQ 2/9 Scores 06/14/2021 06/14/2021 02/16/2021 01/19/2021 03/09/2020 01/01/2020 05/06/2018  PHQ - 2 Score 0 0 5 3 0 0 0  PHQ- 9 Score - - 6 8 - - -    Fall Risk Fall Risk  06/14/2021 02/16/2021 03/09/2020 01/01/2020 05/06/2018  Falls in the past year? 0 1 1 1 1   Comment - - - - tripped on the curb  Number falls in past yr: 0 1 1 1  0  Injury with Fall? 0 1 0 1 1  Comment - - - - broke his collar bone, hands and chest now numb  Risk for fall due to : (No Data) History of fall(s) History of fall(s) - -  Risk for fall due to: Comment quad cane - - - -  Follow up Falls evaluation completed Education provided;Falls prevention discussed Falls prevention discussed - -     FALL RISK PREVENTION PERTAINING TO  THE HOME:  Any stairs in or around the home? Yes  If so, are there any without handrails? No  Home free of loose throw rugs in walkways, pet beds, electrical cords, etc? Yes  Adequate lighting in your home to reduce risk of falls? Yes   ASSISTIVE DEVICES UTILIZED TO PREVENT FALLS:  Life alert? No  Use of a cane, walker or w/c? Yes  Grab bars in the bathroom? Yes  Shower chair or bench in shower? Yes  Elevated toilet seat or a handicapped toilet? Yes    Cognitive Function:  Normal cognitive status assessed by direct observation by this Nurse Health Advisor. No abnormalities found.        Immunizations Immunization History  Administered Date(s) Administered   [employer] Vaccination 10/23/2019, 11/20/2019, 07/30/2020    TDAP status: Due, Education has been provided regarding the importance of this vaccine. Advised may receive this vaccine at local pharmacy or Health Dept. Aware to provide a copy of the vaccination record if obtained from local pharmacy or Health Dept. Verbalized acceptance and understanding.  Flu Vaccine status: Declined, Education has been provided regarding the importance of this vaccine but patient still declined. Advised may receive this vaccine at local pharmacy or Health Dept. Aware to provide a copy of the vaccination record if obtained from local pharmacy or Health Dept. Verbalized acceptance and understanding.  Pneumococcal vaccine status: Due, Education has been provided regarding the importance of this vaccine. Advised may receive this vaccine at local pharmacy or Health Dept. Aware to provide a copy of the vaccination record if obtained from local pharmacy or Health Dept. Verbalized acceptance and understanding.  Covid-19 vaccine status: Completed vaccines  Qualifies for Shingles Vaccine? Yes   Zostavax completed No   Shingrix Completed?: No.    Education has been provided regarding the importance of  this vaccine. Patient has been advised to call insurance company to determine out of pocket expense if they have not yet received this vaccine. Advised may also receive vaccine at local pharmacy or Health Dept. Verbalized acceptance and understanding.  Screening Tests Health Maintenance  Topic Date Due   Pneumonia Vaccine 48+ Years old (1 - PCV) Never done   Zoster Vaccines- Shingrix (1 of 2) Never done   COLONOSCOPY (Pts 45-69yrs Insurance coverage will need to be confirmed)  Never done   COVID-19 Vaccine (4 - Booster for Moderna series) 09/24/2020   INFLUENZA VACCINE  08/26/2021 (Originally 12/27/2020)   TETANUS/TDAP  03/09/2022 (Originally 04/02/1970)   Hepatitis C Screening  03/09/2022 (Originally 04/02/1969)   HPV VACCINES  Aged Out    Health Maintenance  Health Maintenance Due  Topic Date Due   Pneumonia Vaccine 3+ Years old (1 - PCV) Never done   Zoster Vaccines- Shingrix (1 of 2) Never done   COLONOSCOPY (Pts 45-91yrs Insurance coverage will need to be confirmed)  Never done   COVID-19 Vaccine (4 - Booster for Moderna series) 09/24/2020    Colorectal cancer screening: Referral to GI placed scheduled 07/06/2021. Pt aware the office will call re: appt.  Lung Cancer Screening: (Low Dose CT Chest recommended if Age 32-80 years, 30 pack-year currently smoking OR have quit w/in 15years.) does qualify.   Lung Cancer Screening Referral: current treatment for lung cancer   Additional Screening:  Hepatitis C Screening: does qualify;   Vision Screening: Recommended annual ophthalmology exams for early detection of glaucoma and other disorders of the eye. Is the patient up to date with their annual eye exam?  Yes  Who is the provider or what is the name of the office in which the patient attends annual eye exams? Dr.Gould  If pt is not established with a provider, would they like to be referred to a provider to establish care? No .   Dental Screening: Recommended annual dental exams  for proper oral hygiene  Community Resource Referral / Chronic Care Management: CRR required this visit?  No   CCM required this visit?  No      Plan:     I have personally reviewed and noted the following in the patients chart:   Medical and social history Use of alcohol, tobacco or illicit drugs  Current medications and supplements including opioid prescriptions. Patient is not currently taking opioid prescriptions. Functional ability and status Nutritional status Physical activity Advanced directives List of other physicians Hospitalizations, surgeries, and ER visits in previous 12 months Vitals Screenings to include cognitive, depression, and falls Referrals and appointments  In addition, I have reviewed and discussed with patient certain preventive protocols, quality metrics, and best practice recommendations. A written personalized care plan for preventive services as well as general preventive health recommendations were provided to patient.     Randel Pigg, LPN   [id number]   Nurse Notes: none

## 2021-06-15 ENCOUNTER — Other Ambulatory Visit: Payer: Self-pay | Admitting: Nurse Practitioner

## 2021-06-15 DIAGNOSIS — I5042 Chronic combined systolic (congestive) and diastolic (congestive) heart failure: Secondary | ICD-10-CM

## 2021-06-15 NOTE — Telephone Encounter (Signed)
Pharmacy called for refill request on Spironolactone. Pt is totally out

## 2021-06-22 ENCOUNTER — Ambulatory Visit (INDEPENDENT_AMBULATORY_CARE_PROVIDER_SITE_OTHER): Payer: Medicare Other | Admitting: Adult Health

## 2021-06-22 ENCOUNTER — Encounter: Payer: Self-pay | Admitting: Adult Health

## 2021-06-22 ENCOUNTER — Ambulatory Visit: Payer: Medicare Other

## 2021-06-22 VITALS — BP 105/65 | HR 57 | Ht 73.0 in | Wt 140.0 lb

## 2021-06-22 DIAGNOSIS — I63511 Cerebral infarction due to unspecified occlusion or stenosis of right middle cerebral artery: Secondary | ICD-10-CM | POA: Diagnosis not present

## 2021-06-22 NOTE — Telephone Encounter (Signed)
Chart supports Rx  Next OV 06/2021

## 2021-06-22 NOTE — Patient Instructions (Signed)
Continue warfarin daily  and atorvastatin 80 mg daily for secondary stroke prevention  Continue to follow-up with cardiology as advised  Continue to follow up with PCP regarding cholesterol and blood pressure management as well as monitoring of INR levels with goal of 2-3 Maintain strict control of hypertension with blood pressure goal below 130/90 and cholesterol with LDL cholesterol (bad cholesterol) goal below 70 mg/dL.   Signs of a Stroke? Follow the BEFAST method:  Balance Watch for a sudden loss of balance, trouble with coordination or vertigo Eyes Is there a sudden loss of vision in one or both eyes? Or double vision?  Face: Ask the person to smile. Does one side of the face droop or is it numb?  Arms: Ask the person to raise both arms. Does one arm drift downward? Is there weakness or numbness of a leg? Speech: Ask the person to repeat a simple phrase. Does the speech sound slurred/strange? Is the person confused ? Time: If you observe any of these signs, call 911.        Thank you for coming to see [place] at [facility]. I hope we have been able to provide you high quality care today.  You may receive a patient satisfaction survey over the next few weeks. We would appreciate your feedback and comments so that we may continue to improve ourselves and the health of our patients.

## 2021-06-22 NOTE — Progress Notes (Signed)
[facility] [address] [place]. [place] [address] [phone]       OFFICE FOLLOW-UP NOTE  Jared Stevens Date of Birth:  1950-09-03 Medical Record Number:  [id number]    Primary neurologist: Dr. Leonie Man Reason for visit: Stroke follow-up    Chief Complaint  Patient presents with   Follow-up    RM 2 alone Pt is well and stable, no new concerns       HPI:    Update 06/22/2021 JM: Returns for 1 year stroke follow-up.  Overall stable without new stroke/TIA symptoms.  Residual deficits of left hand weakness/numbness and occasional dragging of left leg stable.  Continued use of cane for ambulation.  Denies any recent falls.  Remains on warfarin and routine INR levels typically stable monitored by PCP.  Denies any side effects.  Compliant on atorvastatin 80 mg daily without side effects.  Blood pressure today 105/65.  Routinely follows with PCP and cardiology.  No further concerns at this time.    History provided for reference purposes only Update 06/22/2020 JM: Jared Stevens returns for 1 year stroke follow-up.  Stable since prior visit without new stroke/TIA symptoms. Reports residual bilateral R>L hand numbness which has been present since his stroke as well as occasional dragging of left leg.  Uses cane for ambulation and denies any recent falls.  He does experience bilateral hand pain with colder temperatures but otherwise denies pain.  Remains on warfarin with recent INR level 3.5 monitored/managed by PCP.  Denies bleeding or bruising.  He does have multiple questions in regards to diet and vitamin K with use of warfarin.  Remains on atorvastatin 80 mg daily without myalgias.  Blood pressure today 133/89.  No further concerns at this time.  Update 06/26/2019 Dr. Leonie Man: He returns for follow-up after last visit 6 months ago.  He is doing well from stroke standpoint without recurrent stroke or TIA symptoms.  He remains on warfarin and his INR has been fairly steady  between 2 and 3.  He denies significant bleeding or bruising.  Continues to have some numbness and diminished fine motor skills in his hands and struggles while wearing his clothes.  Continues to walk long distances with a cane.  He feels his balance is still off.  He is an only one minor fall but no major injuries.  He had a follow-up echocardiogram done in August 2020 which showed improvement in his ejection fraction to now 20%.  He does have a follow-up appointment with his cardiologist next month.  He has no new complaints.  His blood pressure is well controlled and it is 100/63 today he is tolerating Lipitor well without any side effects  Initial visit 12/10/2018 Dr. Leonie Man: Jared Stevens is a 71 year old African-American male seen today for initial office follow-up visit following hospital admission for stroke in May 2020.  History is obtained from the patient and review of electronic medical records.  I personally reviewed imaging films in PACS.Jared Stevens is a 71 y.o. male with history of ongoing tobacco use, COPD, CHF, cardiomyopathy (EF 15%), lung cancer, hx of substance abuse, and Hld presenting with acute onset of left sided weakness with hemineglect, left facial droop, confusion and dysphasia after falling in bathroom.  CT scan of the head on admission showed no acute pathology.  Patient met criteria for IV TPA which was administered uneventfully.  CT angiogram showed acute thrombus occluding the distal right M1 segment and extending into the M2 branches.  CT perfusion  showed significant penumbra.  Patient was taken for emergent mechanical thrombectomy which was performed successfully withTICI 2b reperfusion achieved by Dr. Estanislado Pandy.  Patient was admitted to the intensive care unit and had close neurological monitoring and strict blood pressure control and did well.  MRI scan subsequently showed patchy right MCA infarct involving temporal lobes, right insula, basal ganglia and frontal lobe.  2D echo  showed reduced ejection fraction of 15% but no definite clot.  LDL cholesterol was 94 mg percent and hemoglobin A1c was 5.7.  Urine drug screen was positive only for benzodiazepines.  Patient had been on aspirin which was switch to warfarin for anticoagulation.  He was transferred for rehabilitation to skilled nursing facility but patient signed himself out and his living at home and getting home therapy.  He states he is doing well.  He can walk about 1 to 2 miles.  He uses a cane for outdoors but can walk indoors without it.  He states that his left leg drags a little bit but he can catch himself and avoid falls and injuries.  He is also noticed some decreased fine motor skills in his left hand.  He is tolerating warfarin well but his INR does fluctuate.  His last INR that I can find in the system was in 10/29/2018 and was optimal at 2.2.  Denies significant bleeding though he does bruise easily.  He has not had seen his cancer doctor for follow-up and is missed a recent appointment due to the [place] situation.  He has quit smoking.  ROS:   14 system review of systems is positive for those listed in HPI and all other systems negative  PMH:  Past Medical History:  Diagnosis Date   Acute systolic CHF (congestive heart failure) ([place])    2D ECHO 09/27/2018 revealed left ventricular ejection fraction of 15%   CHF exacerbation ([place]) 09/27/2018   Chronic obstructive pulmonary disease    Combined systolic and diastolic CHF    Mixed Ischemic/Non-Ischemic CM // Echo 09/2018: EF 15 // cMRI 10/2018: EF 11, inf scar // Echo 12/2018: EF 20-25, Gr 3 DD // Not a candidate for ICD due to comorbid illnesses    Coronary artery disease    cath 09/2018: RCA chronically occluded >> Med Rx   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   Tx with tPA and thrombectomy // Coumadin (managed by PCP)   Hx of recurrent R pleural effusion    s/p thoracentesis   Hypercholesteremia    Long term (current) use of anticoagulants 01/08/2020   Lung cancer ([place])  03/2016   Mixed Ischemic and Non-Ischemic Cardiomyopathy    Non-small cell carcinoma of lung    Nonsustained ventricular tachycardia     Social History:  Social History   Socioeconomic History   Marital status: Widowed    Spouse name: Not on file   Number of children: Not on file   Years of education: Not on file   Highest education level: Not on file  Occupational History   Not on file  Tobacco Use   Smoking status: Former    Packs/day: 0.50    Years: 15.00    Pack years: 7.50    Types: Cigarettes   Smokeless tobacco: Never  Vaping Use   Vaping Use: Never used  Substance and Sexual Activity   Alcohol use: Not Currently    Alcohol/week: 2.0 standard drinks    Types: 2 Cans of beer per week    Comment: 1-2 beers weekly  Drug use: Not Currently   Sexual activity: Not Currently  Other Topics Concern   Not on file  Social History Narrative   Not on file   Social Determinants of Health   Financial Resource Strain: Low Risk    Difficulty of Paying Living Expenses: Not hard at all  Food Insecurity: No Food Insecurity   Worried About Running Out of Food in the Last Year: Never true   [place] in the Last Year: Never true  Transportation Needs: No Transportation Needs   Lack of Transportation (Medical): No   Lack of Transportation (Non-Medical): No  Physical Activity: Sufficiently Active   Days of Exercise per Week: 7 days   Minutes of Exercise per Session: 30 min  Stress: No Stress Concern Present   Feeling of Stress : Not at all  Social Connections: Socially Isolated   Frequency of Communication with Friends and Family: Twice a week   Frequency of Social Gatherings with Friends and Family: Twice a week   Attends Religious Services: Never   Marine scientist or Organizations: No   Attends Archivist Meetings: Never   Marital Status: Widowed  Human resources officer Violence: Not At Risk   Fear of Current or Ex-Partner: No   Emotionally Abused: No    Physically Abused: No   Sexually Abused: No    Medications:   Current Outpatient Medications on File Prior to Visit  Medication Sig Dispense Refill   albuterol (VENTOLIN HFA) 108 (90 Base) MCG/ACT inhaler Inhale 1-2 puffs into the lungs every 6 (six) hours as needed for wheezing or shortness of breath. Patient states he takes only as needed. 8 g 0   amitriptyline (ELAVIL) 50 MG tablet Take 50 mg by mouth at bedtime as needed.      budesonide-formoterol (SYMBICORT) 160-4.5 MCG/ACT inhaler INAHLE 2 PUFFS BY MOUTH TWICE A DAY IN THE MORNING AND EVENING 10.2 each 5   carvedilol (COREG) 3.125 MG tablet Take 1 tablet (3.125 mg total) by mouth 2 (two) times daily with a meal. 180 tablet 3   Cyanocobalamin (B-12) 2500 MCG SUBL Place under the tongue.     digoxin (LANOXIN) 0.125 MG tablet Take 1 tablet (0.125 mg total) by mouth daily. 90 tablet 3   finasteride (PROSCAR) 5 MG tablet Take 5 mg by mouth daily.     furosemide (LASIX) 40 MG tablet Take 1 tablet (40 mg total) by mouth daily. 90 tablet 3   latanoprost (XALATAN) 0.005 % ophthalmic solution Place 1 drop into both eyes daily.  11   rosuvastatin (CRESTOR) 40 MG tablet Take 1 tablet (40 mg total) by mouth daily. 90 tablet 3   spironolactone (ALDACTONE) 25 MG tablet Take 0.5 tablets (12.5 mg total) by mouth every other day. 45 tablet 1   tamsulosin (FLOMAX) 0.4 MG CAPS capsule Take 0.4 mg by mouth at bedtime.     warfarin (COUMADIN) 5 MG tablet Monday-Thursday take 1tab daily and Friday-Sunday take 1.5tab daily 34 tablet 5   No current facility-administered medications on file prior to visit.    Allergies:  No Known Allergies  Physical Exam Today's Vitals   06/22/21 1035  BP: 105/65  Pulse: (!) 57  Weight: 140 lb (63.5 kg)  Height: 6' 1"  (1.854 m)    Body mass index is 18.47 kg/m.   General: Frail very pleasant middle-aged African-American male, seated, in no evident distress Head: head normocephalic and atraumatic.  Neck:  supple with no carotid or supraclavicular bruits  Cardiovascular: regular rate and rhythm, no murmurs Musculoskeletal: no deformity Skin:  no rash/petichiae Vascular:  Normal pulses all extremities  Neurologic Exam Mental Status: Awake and fully alert. Oriented to place and time. Recent and remote memory intact. Attention span, concentration and fund of knowledge appropriate. Mood and affect appropriate.  Cranial Nerves: Pupils equal, briskly reactive to light. Extraocular movements full without nystagmus. Visual fields full to confrontation. Hearing intact. Facial sensation intact. Face, tongue, palate moves normally and symmetrically.  Motor: Normal bulk and tone. Normal strength in all tested extremity muscles except slightly decreased left hand dexterity. Unable to appreciate left ADF weakness on todays exam  Sensory.: intact to touch ,pinprick .position and vibratory sensation.  Coordination: Rapid alternating movements normal in all extremities except slightly decreased left hand. Finger-to-nose and heel-to-shin performed accurately bilaterally. Gait and Station: Arises from chair without difficulty. Stance is normal. Gait demonstrates normal stride length and mild imbalance and use of cane.  Unable to tandem walk or heel toe Reflexes: 1+ and symmetric. Toes downgoing.      ASSESSMENT/PLAN: 71 year old African-American male with embolic right MCA stroke in May 2020 treated with IV TPA followed by successful mechanical thrombectomy from cardiogenic embolism from ischemic cardiomyopathy.  Vascular risk factors of hypercoagulability from adenocarcinoma of the lung, cardiomyopathy with low ejection fraction, hyper lipidemia., and chronic smoking     R MCA stroke :  Residual deficits of left hand decreased dexterity and intermittent left leg dragging - overall stable.  Advised to continue use of cane at all times and was otherwise instructed Continue warfarin daily  and atorvastatin 80 mg  daily for secondary stroke prevention.  Discussed secondary stroke prevention measures and importance of close PCP f/u for aggressive stroke risk factor management  All mediations managed by PCP/cardiology  Ischemic cardiomyopathy: On warfarin with INR goal 2-3 monitored/managed by PCP.  Provided patient with vitamin K and warfarin education but advised to discuss further with PCP prior to making any dietary changes HTN: BP goal <130/90. Stable on current regimen per PCP HLD: LDL goal <70. Lipid panel 10/2020 LDL 70. On atorvastatin 80 mg daily per PCP    Overall stable.  No further recommendations from stroke standpoint.  Follow-up as needed   CC:  [name], [name], NP   I spent 24 minutes of face-to-face and non-face-to-face time with patient.  This included previsit chart review, lab review, study review, electronic health record documentation, patient education and discussion regarding history of prior stroke, residual deficits, ongoing use of warfarin, importance of managing stroke risk factors and answered all other questions to patient satisfaction  Frann Rider, [facility]  [facility] [address] [place], [place] [address]  Phone [phone] Fax [phone] Note: This document was prepared with digital dictation and possible smart phrase technology. Any transcriptional errors that result from this process are unintentional.

## 2021-06-29 ENCOUNTER — Other Ambulatory Visit: Payer: Self-pay

## 2021-06-29 ENCOUNTER — Other Ambulatory Visit: Payer: Commercial Managed Care - HMO

## 2021-06-29 NOTE — Progress Notes (Signed)
Pt here for INR check per Baldo Ash [name]-NP  Goal INR = 2.0-3.0  Last INR = 3.0  Pt currently takes Coumadin 5 mg Monday-Thursday and 7.5 mg Friday-Sunday  Date of last Coumadin dose = Today 06/29/21  Pt denies recent antibiotics, no dietary changes and no unusual bruising / bleeding.  INR today = 2.1   Pt advised per Baldo Ash [name]-NP to continue current dose at 5 mg Mon.-Thurs. And 7.5 mg Fri.-Sunday. Scheduled to return in 1 month for recheck. BG, RMA

## 2021-07-06 ENCOUNTER — Ambulatory Visit (HOSPITAL_COMMUNITY)
Admission: RE | Admit: 2021-07-06 | Discharge: 2021-07-06 | Disposition: A | Discharge status: Home / Self Care | Payer: Medicare Other | Source: Ambulatory Visit | Attending: Gastroenterology | Admitting: Gastroenterology

## 2021-07-06 DIAGNOSIS — I071 Rheumatic tricuspid insufficiency: Secondary | ICD-10-CM | POA: Diagnosis not present

## 2021-07-06 DIAGNOSIS — E785 Hyperlipidemia, unspecified: Secondary | ICD-10-CM | POA: Diagnosis not present

## 2021-07-06 DIAGNOSIS — I5042 Chronic combined systolic (congestive) and diastolic (congestive) heart failure: Secondary | ICD-10-CM | POA: Insufficient documentation

## 2021-07-06 DIAGNOSIS — J449 Chronic obstructive pulmonary disease, unspecified: Secondary | ICD-10-CM | POA: Diagnosis not present

## 2021-07-06 LAB — ECHOCARDIOGRAM COMPLETE
AR max vel: 2.84 cm2
AV Area VTI: 2.76 cm2
AV Area mean vel: 2.7 cm2
AV Mean grad: 1 mmHg
AV Peak grad: 2.6 mmHg
Ao pk vel: 0.81 m/s
Area-P 1/2: 3.43 cm2
S' Lateral: 3.1 cm

## 2021-07-06 NOTE — Progress Notes (Signed)
°  Echocardiogram 2D Echocardiogram has been performed.  Jared Stevens 07/06/2021, 8:46 AM

## 2021-07-08 ENCOUNTER — Other Ambulatory Visit: Payer: Self-pay | Admitting: Physician Assistant

## 2021-07-11 ENCOUNTER — Other Ambulatory Visit: Payer: Self-pay

## 2021-07-11 ENCOUNTER — Other Ambulatory Visit: Payer: Medicaid Other | Admitting: Hospice

## 2021-07-11 DIAGNOSIS — Z515 Encounter for palliative care: Secondary | ICD-10-CM | POA: Diagnosis not present

## 2021-07-11 DIAGNOSIS — I5042 Chronic combined systolic (congestive) and diastolic (congestive) heart failure: Secondary | ICD-10-CM | POA: Diagnosis not present

## 2021-07-11 DIAGNOSIS — Z85118 Personal history of other malignant neoplasm of bronchus and lung: Secondary | ICD-10-CM

## 2021-07-11 DIAGNOSIS — J449 Chronic obstructive pulmonary disease, unspecified: Secondary | ICD-10-CM | POA: Diagnosis not present

## 2021-07-11 NOTE — Progress Notes (Signed)
[place] Consult Note Telephone: [phone]  Fax: [phone]  PATIENT NAME: Jared Stevens DOB: 30-Nov-1950 MRN: [id number]  PRIMARY CARE PROVIDER:   Flossie Buffy, NP [place], [name], NP [place],  [place] [address]  REFERRING PROVIDER: Flossie Buffy, NP Lorayne Marek [name], NP [place],  [place] [address]  RESPONSIBLE PARTY:  Self [phone]   [name], [name], daughter  [phone]  Contact Information     Name Relation Home Work Deweese Daughter   [phone]       TELEHEALTH VISIT STATEMENT Due to the COVID-19 crisis, this visit was done via telemedicine from my office and it was initiated and consent by this patient and or family.  I connected with patient OR PROXY by a telephone/video  and verified that I am speaking with the correct person. I discussed the limitations of evaluation and management by telemedicine. The patient expressed understanding and agreed to proceed. Palliative Care was asked to follow this patient to address advance care planning, complex medical decision making and goals of care clarification.   Visit is to build trust and highlight Palliative Medicine as specialized medical care for people living with serious illness, aimed at facilitating better quality of life through symptoms relief, assisting with advance care planning and complex medical decision making. This is a follow up visit.  RECOMMENDATIONS/PLAN:   Advance Care Planning/Code Status: Patient is a Full code  Goals of Care: Goals of care include to maximize quality of life and symptom management.  Visit consisted of counseling and education dealing with the complex and emotionally intense issues of symptom management and palliative care in the setting of serious and potentially life-threatening illness. Palliative care team will continue to support patient,  patient's family, and medical team.  Symptom management/Plan:  CHF: Combined systolic and diastolic. Continue Carvedilol, Digoxin, Aldactone and Lasix as ordered. Education on fluid and salt limits.Patient reports he now cooks most time to avoid fast food which is high in salt; validation provided; encouraged to incorporate fruits and vegetables - DASH diet. Continue f/u appointments with Cardiologist COPD: Continue with breathing as ordered. No recent exacerbation Lung CA: had completed chemo/radiation. Continue with Oncology surveillance. CT scan  Of lungs Sept 16 2021 showed no evidence of recurrent or metastatic disease in the chest.  He reports He also sees his Oncologist every 6 months  Follow up: Palliative care will continue to follow for complex medical decision making, advance care planning, and clarification of goals. Return 6 weeks or prn. Encouraged to call provider sooner with any concerns.  CHIEF COMPLAINT: Palliative follow up  HISTORY OF PRESENT ILLNESS:  Jared Stevens a 71 y.o. male with multiple medical problems including CHF, HLD, COPD.  History of lung  cancer for which patient completed chemo/radiation; currently on surveillance with oncology.  Patient denies shortness of breath, pain/discomfort, no edema in extremities.  History obtained from review of EMR, discussion with primary team, family and/or patient. Records reviewed and summarized above. All 10 point systems reviewed and are negative except as documented in history of present illness above  Review and summarization of Epic records shows history from other than patient.   Palliative Care was asked to follow this patient o help address complex decision making in the context of advance care planning and goals of care clarification. I reviewed, as needed, available labs, patient records, imaging, studies and related documents from the EMR.  PERTINENT MEDICATIONS:  Outpatient Encounter Medications as of 07/11/2021   Medication Sig   albuterol (VENTOLIN HFA) 108 (90 Base) MCG/ACT inhaler Inhale 1-2 puffs into the lungs every 6 (six) hours as needed for wheezing or shortness of breath. Patient states he takes only as needed.   amitriptyline (ELAVIL) 50 MG tablet Take 50 mg by mouth at bedtime as needed.    budesonide-formoterol (SYMBICORT) 160-4.5 MCG/ACT inhaler INAHLE 2 PUFFS BY MOUTH TWICE A DAY IN THE MORNING AND EVENING   carvedilol (COREG) 3.125 MG tablet Take 1 tablet (3.125 mg total) by mouth 2 (two) times daily with a meal.   Cyanocobalamin (B-12) 2500 MCG SUBL Place under the tongue.   digoxin (LANOXIN) 0.125 MG tablet Take 1 tablet (0.125 mg total) by mouth daily.   finasteride (PROSCAR) 5 MG tablet Take 5 mg by mouth daily.   furosemide (LASIX) 40 MG tablet Take 1 tablet (40 mg total) by mouth daily.   latanoprost (XALATAN) 0.005 % ophthalmic solution Place 1 drop into both eyes daily.   rosuvastatin (CRESTOR) 40 MG tablet TAKE ONE TABLET BY MOUTH EVERY DAY   spironolactone (ALDACTONE) 25 MG tablet TAKE 0.5 TABLETS (12.5 MG TOTAL) BY MOUTH EVERY OTHER DAY.   tamsulosin (FLOMAX) 0.4 MG CAPS capsule Take 0.4 mg by mouth at bedtime.   warfarin (COUMADIN) 5 MG tablet Monday-Thursday take 1tab daily and Friday-Sunday take 1.5tab daily   No facility-administered encounter medications on file as of 07/11/2021.    HOSPICE ELIGIBILITY/DIAGNOSIS: TBD  PAST MEDICAL HISTORY:  Past Medical History:  Diagnosis Date   Acute systolic CHF (congestive heart failure) ([place])    2D ECHO 09/27/2018 revealed left ventricular ejection fraction of 15%   CHF exacerbation ([place]) 09/27/2018   Chronic obstructive pulmonary disease    Combined systolic and diastolic CHF    Mixed Ischemic/Non-Ischemic CM // Echo 09/2018: EF 15 // cMRI 10/2018: EF 11, inf scar // Echo 12/2018: EF 20-25, Gr 3 DD // Not a candidate for ICD due to comorbid illnesses    Coronary artery disease    cath 09/2018: RCA chronically occluded >> Med Rx    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 More than 50% of the time in this consultation was spent on counseling and coordinating communication   Thank you for the opportunity to participate in the care of Jared Stevens Please call our office at [phone] if we can be of additional assistance.  Note: Portions of this note were generated with Lobbyist. Dictation errors may occur despite best attempts at proofreading.  Teodoro Spray, NP

## 2021-07-14 ENCOUNTER — Telehealth: Payer: Self-pay

## 2021-07-14 NOTE — Telephone Encounter (Signed)
-----   Message from Daryel November, MD sent at 07/07/2021  5:20 PM EST ----- I don't think we ever got anything back from his GI provider in [place].  He says he had a colonoscopy in 2018 (see my clinic note).  Can you try to follow up on that?

## 2021-07-14 NOTE — Telephone Encounter (Signed)
[facility] is now [facility] [address] [place] [address] Phone: [phone]  Signed release faxed to [phone] as a STAT request.

## 2021-07-15 ENCOUNTER — Telehealth: Payer: Self-pay | Admitting: *Deleted

## 2021-07-15 NOTE — Chronic Care Management (AMB) (Signed)
°  Care Management   Note  07/15/2021 Name: Jared Stevens MRN: [id number] DOB: [birth year]  Jared Stevens is a 71 y.o. year old male who is a primary care patient of [name], [name], NP and is actively engaged with the care management team. I reached out to [name] by phone today to assist with re-scheduling a follow up visit with the RN Case Manager  Follow up plan: Telephone appointment with care management team member scheduled for: 07/27/2021  Julian Hy, [place], [place] Management  Direct Dial: [phone]

## 2021-07-18 NOTE — Telephone Encounter (Signed)
Rec'd notice from [facility] that they do not have record of a procedure for this patient.  Sent MyChart message to patient requesting that he try to track down where and when he had his last procedure so we can request those records.

## 2021-07-20 NOTE — Telephone Encounter (Signed)
Patient has not read the MyChart message. Called patient and spoke to him.  He confirmed he thinks he had a colonoscopy at [facility] in [place], [place]. I let him know they have no record of his procedure. He indicated he will call and see if he can figure out who did his procedure and when, so we can get the records. He will call back if he has more information. I called back to [facility] to double check and they said the only services he received at their facility was Oncology from 2017 through 2018, NO colonoscopy.

## 2021-07-22 ENCOUNTER — Encounter: Payer: Self-pay | Admitting: Nurse Practitioner

## 2021-07-22 ENCOUNTER — Ambulatory Visit (INDEPENDENT_AMBULATORY_CARE_PROVIDER_SITE_OTHER): Payer: Medicare Other | Admitting: Nurse Practitioner

## 2021-07-22 ENCOUNTER — Other Ambulatory Visit: Payer: Self-pay

## 2021-07-22 VITALS — BP 110/62 | HR 82 | Temp 97.3°F | Ht 73.0 in | Wt 132.4 lb

## 2021-07-22 DIAGNOSIS — G620 Drug-induced polyneuropathy: Secondary | ICD-10-CM | POA: Insufficient documentation

## 2021-07-22 DIAGNOSIS — N400 Enlarged prostate without lower urinary tract symptoms: Secondary | ICD-10-CM | POA: Diagnosis not present

## 2021-07-22 DIAGNOSIS — I2510 Atherosclerotic heart disease of native coronary artery without angina pectoris: Secondary | ICD-10-CM | POA: Diagnosis not present

## 2021-07-22 DIAGNOSIS — J42 Unspecified chronic bronchitis: Secondary | ICD-10-CM | POA: Diagnosis not present

## 2021-07-22 DIAGNOSIS — E782 Mixed hyperlipidemia: Secondary | ICD-10-CM | POA: Diagnosis not present

## 2021-07-22 DIAGNOSIS — Z7901 Long term (current) use of anticoagulants: Secondary | ICD-10-CM | POA: Diagnosis not present

## 2021-07-22 DIAGNOSIS — C3491 Malignant neoplasm of unspecified part of right bronchus or lung: Secondary | ICD-10-CM | POA: Diagnosis not present

## 2021-07-22 DIAGNOSIS — Z23 Encounter for immunization: Secondary | ICD-10-CM | POA: Diagnosis not present

## 2021-07-22 DIAGNOSIS — T451X5A Adverse effect of antineoplastic and immunosuppressive drugs, initial encounter: Secondary | ICD-10-CM | POA: Insufficient documentation

## 2021-07-22 LAB — COMPREHENSIVE METABOLIC PANEL
ALT: 13 U/L (ref 0–53)
AST: 17 U/L (ref 0–37)
Albumin: 3.8 g/dL (ref 3.5–5.2)
Alkaline Phosphatase: 68 U/L (ref 39–117)
BUN: 18 mg/dL (ref 6–23)
CO2: 35 mEq/L — ABNORMAL HIGH (ref 19–32)
Calcium: 9.9 mg/dL (ref 8.4–10.5)
Chloride: 100 mEq/L (ref 96–112)
Creatinine, Ser: 0.77 mg/dL (ref 0.40–1.50)
GFR: 90.84 mL/min (ref >60.00)
Glucose, Bld: 80 mg/dL (ref 70–99)
Potassium: 3.7 mEq/L (ref 3.5–5.1)
Sodium: 138 mEq/L (ref 135–145)
Total Bilirubin: 0.4 mg/dL (ref 0.2–1.2)
Total Protein: 7.7 g/dL (ref 6.0–8.3)

## 2021-07-22 LAB — LIPID PANEL
Cholesterol: 125 mg/dL (ref 0–200)
HDL: 47 mg/dL (ref >39.00)
LDL Cholesterol: 69 mg/dL (ref 0–99)
NonHDL: 78.16
Total CHOL/HDL Ratio: 3
Triglycerides: 47 mg/dL (ref 0.0–149.0)
VLDL: 9.4 mg/dL (ref 0.0–40.0)

## 2021-07-22 LAB — POCT INR: INR: 2.3 (ref 2.0–3.0)

## 2021-07-22 MED ORDER — WARFARIN SODIUM 5 MG PO TABS: ORAL_TABLET | ORAL | 5 refills | Status: DC

## 2021-07-22 NOTE — Progress Notes (Signed)
Subjective:  Patient ID: Jared Stevens, male    DOB: 12-18-1950  Age: 71 y.o. MRN: [id number]  CC: Follow-up (6 month f/u on CHF and cholesterol. Jeannie Done check today resulted at 2.3)  HPI Benign prostatic hyperplasia without lower urinary tract symptoms Has intermittent urinary hesistancy did not schedule appt with urology as previously discussed Current use of flomax and proscar  Advised to schedule appt with urology due to elevated PSA  Non-small cell lung cancer, right ([place]) Completed chemo 2017 while in [place] CT chest done 10/19/2020:Stable appearance of treated tumor within the perihilar right lung. No signs of residual or recurrent tumor. Decrease in volume of right pleural effusion. Coronary artery atherosclerotic calcifications. Under the care of Dr. Earlie Server (oncology) Last OV 11/09/2020. Advised to f/up annually.  Today he denies any SOB or chest pain or cough   CAD (coronary artery disease) Hx of occluded RCA, catherization done 2020. BP at goal Persistent tobacco use Hx of cocaine use. Hx of MCA CVA 2020. Last echo 06/2021: Left ventricular ejection fraction, by estimation, is 40 to 45%. The left ventricle has mildly decreased function. The left ventricle demonstrates global hypokinesis. There is moderate left ventricular hypertrophy. Left ventricular diastolic parameters are consistent with Grade I diastolic dysfunction (impaired relaxation). Right ventricular systolic function is mildly reduced. The right  ventricular size is normal.The mitral valve is grossly normal. Trivial mitral valve regurgitation. The aortic valve is tricuspid. Aortic valve regurgitation is not visualized. Aortic dilatation noted. There is borderline dilatation of the aortic root, measuring 38 mm.  Under the care of Dr. Acie Fredrickson (cardiology)  Today he denies any CP/SOB/palpitation/LE edema/PND Current use of digoxin, crestor, spironolactone, furosemide, and coreg Repeat lipid panel and CMP  COPD  (chronic obstructive pulmonary disease) ([place]) Stable with use of symbicort BID and albuterol prn  BP Readings from Last 3 Encounters:  07/22/21 110/62  06/22/21 105/65  05/11/21 106/68    Wt Readings from Last 3 Encounters:  07/22/21 132 lb 6.4 oz (60.1 kg)  06/22/21 140 lb (63.5 kg)  05/11/21 136 lb 12.8 oz (62.1 kg)    Reviewed past Medical, Social and Family history today.  Outpatient Medications Prior to Visit  Medication Sig Dispense Refill   albuterol (VENTOLIN HFA) 108 (90 Base) MCG/ACT inhaler Inhale 1-2 puffs into the lungs every 6 (six) hours as needed for wheezing or shortness of breath. Patient states he takes only as needed. 8 g 0   amitriptyline (ELAVIL) 50 MG tablet Take 50 mg by mouth at bedtime as needed.      budesonide-formoterol (SYMBICORT) 160-4.5 MCG/ACT inhaler INAHLE 2 PUFFS BY MOUTH TWICE A DAY IN THE MORNING AND EVENING 10.2 each 5   carvedilol (COREG) 3.125 MG tablet Take 1 tablet (3.125 mg total) by mouth 2 (two) times daily with a meal. 180 tablet 3   Cyanocobalamin (B-12) 2500 MCG SUBL Place under the tongue.     digoxin (LANOXIN) 0.125 MG tablet Take 1 tablet (0.125 mg total) by mouth daily. 90 tablet 3   finasteride (PROSCAR) 5 MG tablet Take 5 mg by mouth daily.     furosemide (LASIX) 40 MG tablet Take 1 tablet (40 mg total) by mouth daily. 90 tablet 3   latanoprost (XALATAN) 0.005 % ophthalmic solution Place 1 drop into both eyes daily.  11   rosuvastatin (CRESTOR) 40 MG tablet TAKE ONE TABLET BY MOUTH EVERY DAY 90 tablet 3   spironolactone (ALDACTONE) 25 MG tablet TAKE 0.5 TABLETS (12.5 MG TOTAL) BY MOUTH  EVERY OTHER DAY. 45 tablet 1   tamsulosin (FLOMAX) 0.4 MG CAPS capsule Take 0.4 mg by mouth at bedtime.     warfarin (COUMADIN) 5 MG tablet Monday-Thursday take 1tab daily and Friday-Sunday take 1.5tab daily 34 tablet 5   No facility-administered medications prior to visit.    ROS See HPI  Objective:  BP 110/62 (BP Location: Left Arm, Patient  Position: Sitting, Cuff Size: Normal)    Pulse 82    Temp (!) 97.3 F (36.3 C) (Temporal)    Ht 6\' 1"  (1.854 m)    Wt 132 lb 6.4 oz (60.1 kg)    SpO2 96%    BMI 17.47 kg/m   Physical Exam Cardiovascular:     Rate and Rhythm: Normal rate and regular rhythm.     Pulses: Normal pulses.     Heart sounds: Normal heart sounds.  Pulmonary:     Effort: Pulmonary effort is normal. No respiratory distress.     Breath sounds: No stridor. No wheezing or rales.     Comments: Diminished lung sounds in RUL, normal in other lobes Musculoskeletal:     Right lower leg: No edema.     Left lower leg: No edema.  Neurological:     Mental Status: He is alert and oriented to person, place, and time.  Psychiatric:        Mood and Affect: Mood normal.        Behavior: Behavior normal.        Thought Content: Thought content normal.   Assessment & Plan:  This visit occurred during the SARS-CoV-2 public health emergency.  Safety protocols were in place, including screening questions prior to the visit, additional usage of staff PPE, and extensive cleaning of exam room while observing appropriate contact time as indicated for disinfecting solutions.   Oseias was seen today for follow-up.  Diagnoses and all orders for this visit:  Mixed hyperlipidemia -     Lipid panel -     Comprehensive metabolic panel  Long term current use of anticoagulant therapy -     POCT INR -     warfarin (COUMADIN) 5 MG tablet; Monday-Thursday take 1tab daily and Friday-Sunday take 1.5tab daily -     Comprehensive metabolic panel  Need for pneumococcal vaccine -     Pneumococcal conjugate vaccine 20-valent (Prevnar 20)  Benign prostatic hyperplasia without lower urinary tract symptoms  Non-small cell lung cancer, right (HCC)  Coronary artery disease involving native coronary artery of native heart without angina pectoris  Chronic bronchitis, unspecified chronic bronchitis type (HCC)  INR at goal. Maintain current  coumadin dose. Schedule nurse visit in 56month for coumadin check Schedule appt with urology.  Problem List Items Addressed This Visit       Cardiovascular and Mediastinum   CAD (coronary artery disease)    Hx of occluded RCA, catherization done 2020. BP at goal Persistent tobacco use Hx of cocaine use. Hx of MCA CVA 2020. Last echo 06/2021: Left ventricular ejection fraction, by estimation, is 40 to 45%. The left ventricle has mildly decreased function. The left ventricle demonstrates global hypokinesis. There is moderate left ventricular hypertrophy. Left ventricular diastolic parameters are consistent with Grade I diastolic dysfunction (impaired relaxation). Right ventricular systolic function is mildly reduced. The right  ventricular size is normal.The mitral valve is grossly normal. Trivial mitral valve regurgitation. The aortic valve is tricuspid. Aortic valve regurgitation is not visualized. Aortic dilatation noted. There is borderline dilatation of the aortic root, measuring  38 mm.  Under the care of Dr. Acie Fredrickson (cardiology)  Today he denies any CP/SOB/palpitation/LE edema/PND Current use of digoxin, crestor, spironolactone, furosemide, and coreg Repeat lipid panel and CMP      Relevant Medications   warfarin (COUMADIN) 5 MG tablet     Respiratory   COPD (chronic obstructive pulmonary disease) ([place])    Stable with use of symbicort BID and albuterol prn      Non-small cell lung cancer, right ([place])    Completed chemo 2017 while in [place] CT chest done 10/19/2020:Stable appearance of treated tumor within the perihilar right lung. No signs of residual or recurrent tumor. Decrease in volume of right pleural effusion. Coronary artery atherosclerotic calcifications. Under the care of Dr. Earlie Server (oncology) Last OV 11/09/2020. Advised to f/up annually.  Today he denies any SOB or chest pain or cough       Relevant Medications   warfarin (COUMADIN) 5 MG tablet     Genitourinary    Benign prostatic hyperplasia without lower urinary tract symptoms    Has intermittent urinary hesistancy did not schedule appt with urology as previously discussed Current use of flomax and proscar  Advised to schedule appt with urology due to elevated PSA        Other   Hyperlipidemia - Primary   Relevant Medications   warfarin (COUMADIN) 5 MG tablet   Other Relevant Orders   Lipid panel   Comprehensive metabolic panel   Long term current use of anticoagulant therapy   Relevant Medications   warfarin (COUMADIN) 5 MG tablet   Other Relevant Orders   POCT INR (Completed)   Comprehensive metabolic panel   Other Visit Diagnoses     Need for pneumococcal vaccine       Relevant Orders   Pneumococcal conjugate vaccine 20-valent (Prevnar 20) (Completed)       Follow-up: Return in about 6 months (around 01/19/2022) for HTN and , hyperlipidemia, CHF.  Wilfred Lacy, NP

## 2021-07-22 NOTE — Assessment & Plan Note (Addendum)
Has intermittent urinary hesistancy did not schedule appt with urology as previously discussed Current use of flomax and proscar  Advised to schedule appt with urology due to elevated PSA

## 2021-07-22 NOTE — Assessment & Plan Note (Addendum)
Hx of occluded RCA, catherization done 2020. BP at goal Persistent tobacco use Hx of cocaine use. Hx of MCA CVA 2020. Last echo 06/2021: Left ventricular ejection fraction, by estimation, is 40 to 45%. The left ventricle has mildly decreased function. The left ventricle demonstrates global hypokinesis. There is moderate left ventricular hypertrophy. Left ventricular diastolic parameters are consistent with Grade I diastolic dysfunction (impaired relaxation). Right ventricular systolic function is mildly reduced. The right  ventricular size is normal.The mitral valve is grossly normal. Trivial mitral valve regurgitation. The aortic valve is tricuspid. Aortic valve regurgitation is not visualized. Aortic dilatation noted. There is borderline dilatation of the aortic root, measuring 38 mm.  Under the care of Dr. Acie Fredrickson (cardiology)  Today he denies any CP/SOB/palpitation/LE edema/PND Current use of digoxin, crestor, spironolactone, furosemide, and coreg Repeat lipid panel and CMP

## 2021-07-22 NOTE — Assessment & Plan Note (Signed)
Completed chemo 2017 while in [place] CT chest done 10/19/2020:Stable appearance of treated tumor within the perihilar right lung. No signs of residual or recurrent tumor. Decrease in volume of right pleural effusion. Coronary artery atherosclerotic calcifications. Under the care of Dr. Earlie Server (oncology) Last OV 11/09/2020. Advised to f/up annually.  Today he denies any SOB or chest pain or cough

## 2021-07-22 NOTE — Patient Instructions (Addendum)
INR at goal. Maintain current coumadin dose. Schedule nurse visit in 51month for coumadin check Schedule appt with urology  Go to lab today for blood draw

## 2021-07-22 NOTE — Assessment & Plan Note (Signed)
Stable with use of symbicort BID and albuterol prn

## 2021-07-26 ENCOUNTER — Telehealth: Payer: Self-pay | Admitting: Nurse Practitioner

## 2021-07-26 DIAGNOSIS — Z7901 Long term (current) use of anticoagulants: Secondary | ICD-10-CM

## 2021-07-26 DIAGNOSIS — I6930 Unspecified sequelae of cerebral infarction: Secondary | ICD-10-CM

## 2021-07-27 ENCOUNTER — Ambulatory Visit (INDEPENDENT_AMBULATORY_CARE_PROVIDER_SITE_OTHER): Payer: Medicare Other

## 2021-07-27 ENCOUNTER — Other Ambulatory Visit: Payer: Medicaid Other

## 2021-07-27 DIAGNOSIS — Z9181 History of falling: Secondary | ICD-10-CM

## 2021-07-27 DIAGNOSIS — I5042 Chronic combined systolic (congestive) and diastolic (congestive) heart failure: Secondary | ICD-10-CM

## 2021-07-27 DIAGNOSIS — C3491 Malignant neoplasm of unspecified part of right bronchus or lung: Secondary | ICD-10-CM

## 2021-07-27 NOTE — Telephone Encounter (Signed)
Mr. Martis needs  tooth removed by dentist. Due of hx of excessive bleeding, his dentist is requesting for coumadin to be held >1day post procedure. ?Due to his risk of emboli CVA, I recommend he is bridged with lovenox injection. ?He will need to hold coumadin 1day prior to procedure. Start lovenox injection the day after procedure x 3days. Resume coumadin as previously prescribed on post op day 3.He should schedule nurse visit for INR check when he resumes coumadin. ?Call office for lovenox injection prescription once procedure date is given. ? ?

## 2021-07-27 NOTE — Telephone Encounter (Signed)
Patient and dental office instructed to call our office once a date has been scheduled so that we can get the patient the medication needed.  ?

## 2021-07-28 NOTE — Patient Instructions (Addendum)
Visit Information ? ?Thank you for taking time to visit with me today. Please don't hesitate to contact me if I can be of assistance to you before our next scheduled telephone appointment. ? ?Following are the goals we discussed today:  ? Continue to take medications as prescribed   ?Call pharmacy for medication refills 3-7 days in advance of running out of medications ?Call provider office for new concerns or questions  ?- Follow up with your providers as recommended ?- Weigh self daily and record ?- Monitor for signs/ symptoms of heart failure:  weight gain 3 lbs overnight or 5 lbs in a week, swelling in feet, legs ankles, waist hand,  increase shortness of breath, fatigue/ weakness ( call provider for mild to moderate symptoms. Call 911 for severe symptoms) ? ?Our next appointment is by telephone on 09/14/2021 at 2:00 pm ? ?Please call the care guide team at [phone] if you need to cancel or reschedule your appointment.  ? ?If you are experiencing a Mental Health or [place] or need someone to talk to, please call the Suicide and Crisis Lifeline: 988 ?call 1-800-273-TALK (toll free, 24 hour hotline)  ? ?Patient verbalizes understanding of instructions and care plan provided today and agrees to view in [place]. Active MyChart status confirmed with patient.   ? ?[name] RN,BSN,CCM ?RN Case Manager ?[place]  ?[phone] ? ?

## 2021-07-28 NOTE — Chronic Care Management (AMB) (Signed)
?Chronic Care Management  ? ?CCM RN Visit Note ? ?07/29/2021 ?Name: Jared Stevens MRN: [id number] DOB: Feb 20, 1951 ? ?Subjective: ?Jared Stevens is a 71 y.o. year old male who is a primary care patient of [name], [name], NP. The care management team was consulted for assistance with disease management and care coordination needs.   ? ?Engaged with patient by telephone for follow up visit in response to provider referral for case management and/or care coordination services.  ? ?Consent to Services:  ?The patient was given information about Chronic Care Management services, agreed to services, and gave verbal consent prior to initiation of services.  Please see initial visit note for detailed documentation.  ? ?Patient agreed to services and verbal consent obtained.  ? ?Assessment: Review of patient past medical history, allergies, medications, health status, including review of consultants reports, laboratory and other test data, was performed as part of comprehensive evaluation and provision of chronic care management services.  ? ?SDOH (Social Determinants of Health) assessments and interventions performed:   ? ?CCM Care Plan ? ?No Known Allergies ? ?Outpatient Encounter Medications as of 07/27/2021  ?Medication Sig  ? albuterol (VENTOLIN HFA) 108 (90 Base) MCG/ACT inhaler Inhale 1-2 puffs into the lungs every 6 (six) hours as needed for wheezing or shortness of breath. Patient states he takes only as needed.  ? amitriptyline (ELAVIL) 50 MG tablet Take 50 mg by mouth at bedtime as needed.   ? budesonide-formoterol (SYMBICORT) 160-4.5 MCG/ACT inhaler INAHLE 2 PUFFS BY MOUTH TWICE A DAY IN THE MORNING AND EVENING  ? carvedilol (COREG) 3.125 MG tablet Take 1 tablet (3.125 mg total) by mouth 2 (two) times daily with a meal.  ? Cyanocobalamin (B-12) 2500 MCG SUBL Place under the tongue.  ? digoxin (LANOXIN) 0.125 MG tablet Take 1 tablet (0.125 mg total) by mouth daily.  ? finasteride (PROSCAR) 5 MG tablet Take 5 mg by  mouth daily.  ? furosemide (LASIX) 40 MG tablet Take 1 tablet (40 mg total) by mouth daily.  ? latanoprost (XALATAN) 0.005 % ophthalmic solution Place 1 drop into both eyes daily.  ? rosuvastatin (CRESTOR) 40 MG tablet TAKE ONE TABLET BY MOUTH EVERY DAY  ? spironolactone (ALDACTONE) 25 MG tablet TAKE 0.5 TABLETS (12.5 MG TOTAL) BY MOUTH EVERY OTHER DAY.  ? tamsulosin (FLOMAX) 0.4 MG CAPS capsule Take 0.4 mg by mouth at bedtime.  ? warfarin (COUMADIN) 5 MG tablet Monday-Thursday take 1tab daily and Friday-Sunday take 1.5tab daily  ? ?No facility-administered encounter medications on file as of 07/27/2021.  ? ? ?Patient Active Problem List  ? Diagnosis Date Noted  ? Peripheral neuropathy due to chemotherapy ([place]) 07/22/2021  ? CAD (coronary artery disease) 05/11/2021  ? Benign prostatic hyperplasia without lower urinary tract symptoms 01/19/2021  ? Inguinal hernia of left side without obstruction or gangrene 01/19/2021  ? Acute urinary retention 07/28/2020  ? Long term current use of anticoagulant therapy 01/01/2020  ? Chronic combined systolic and diastolic CHF (congestive heart failure) ([place]) 10/31/2019  ? Neurocognitive deficits 11/06/2018  ? Encephalopathy 10/30/2018  ? History of CVA with residual deficit 10/21/2018  ? 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 Recurrent right pleural effusion   ? History of lung cancer 09/27/2018  ? Elevated brain natriuretic peptide (BNP) level 09/27/2018  ? COPD (chronic obstructive pulmonary disease) ([place]) 09/27/2018  ? Hyperlipidemia 09/27/2018  ? Tobacco use disorder, continuous 09/27/2018  ? Non-small cell lung cancer, right ([place]) 08/26/2018  ? ? ?Conditions to be addressed/monitored:CHF ? ?  Care Plan : RNCM plan of care  ?Updates made by Dannielle Karvonen, RN since 07/29/2021 12:00 AM  ? ?Problem: Chronic disease management education and care coordination needs.   ?Priority: High  ? ?Long-Range Goal: Development of plan of care to address chronic  disease management  and care coordination needs.   ?Start Date: 07/27/2021  ?Expected End Date: 10/26/2021  ?Priority: High  ?Current Barriers:  ?Knowledge Deficits related to plan of care for management of CHF, CAD, and falls  ?Patient reports blood pressures have been normal. Reports blood pressure range 120/70-80's and weight 140 lb.  Patient reports next follow up with primary care provider is for annual wellness visit on 09/07/21.  Per chart review advised by provider to schedule appointment with urologist.  Next follow up with oncology/ hematologist 11/09/21 and follow up with cardiologist in 1 year.  Denies any falls. States he uses quad cane for ambulation ?RNCM Clinical Goal(s):  ?Patient will verbalize understanding of plan for management of CHF, HTN, and falls as evidenced by patient self report and / or notation in chart ?take all medications exactly as prescribed and will call provider for medication related questions as evidenced by patient self report and/ or notation in chart    ?attend all scheduled medical appointments:   as evidenced by patient self report and/ or notation in chart        ?continue to work with Consulting civil engineer and/or Social Worker to address care management and care coordination needs related to CHF, CAD, and falls as evidenced by adherence to CM Team Scheduled appointments     through collaboration with Consulting civil engineer, provider, and care team.  ? ?Interventions: ?1:1 collaboration with primary care provider regarding development and update of comprehensive plan of care as evidenced by provider attestation and co-signature ?Inter-disciplinary care team collaboration (see longitudinal plan of care) ?Evaluation of current treatment plan related to  self management and patient's adherence to plan as established by provider ? ? ?CAD Interventions: (Status:  New goal.) Long Term Goal ?Medications reviewed including medications utilized in CAD treatment plan ?Provided education on Importance of limiting foods  high in cholesterol ?Reviewed medications and discussed compliance ?Reviewed Importance of attending all scheduled provider appointments ? ? ?Heart Failure Interventions:  (Status:  Goal on track:  Yes.) Long Term Goal ?Reviewed medications with patient and discussed importance of compliance ?Discussed plans with patient for ongoing care management follow up and provided patient with direct contact information for care management team ?Reviewed scheduled/upcoming provider appointments  ?Discussed heart failure symptoms.  Advised to weigh daily and notify provider for 3 lbs weight gain overnight/ 5 lbs weight gain in a week.  ? ?Falls Interventions:  (Status:  Goal on track:  Yes.) Long Term Goal ?Reviewed medications and discussed potential side effects of medications such as dizziness and frequent urination ?Assessed for falls since last outreach with RNCM.  ?Reviewed fall precautions ?Reviewed scheduled/upcoming provider appointments   ?Discussed plans with patient for ongoing care management follow up and provided patient with direct contact information for care management team ? ? ?Patient Goals/Self-Care Activities: ? Continue to take medications as prescribed   ?Call pharmacy for medication refills 3-7 days in advance of running out of medications ?Call provider office for new concerns or questions  ?- Follow up with your providers as recommended ?- Weigh self daily and record ?- Monitor for signs/ symptoms of heart failure:  weight gain 3 lbs overnight or 5 lbs in a week,  swelling in feet, legs ankles, waist hand,  increase shortness of breath, fatigue/ weakness ( call provider for mild to moderate symptoms. Call 911 for severe symptoms) ? ?  ? ? ?Plan:The patient has been provided with contact information for the care management team and has been advised to call with any health related questions or concerns.  ?The care management team will reach out to the patient again over the next 2 months . ?[name]  RN,BSN,CCM ?RN Case Manager ?[place]  ?[phone] ? ? ? ? ? ? ? ? ? ?

## 2021-08-01 ENCOUNTER — Other Ambulatory Visit: Payer: Self-pay

## 2021-08-01 DIAGNOSIS — Z1211 Encounter for screening for malignant neoplasm of colon: Secondary | ICD-10-CM

## 2021-08-01 NOTE — Progress Notes (Signed)
The patient's echo shows significant improvement in his EF.  He would be a candidate for a colonoscopy at the [facility].  We were unable to locate a prior colonoscopy report, but the patient states he has no known history of polyps or family history of colon cancer.  Given this, I would give the patient the option of pursuing a Cologuard vs a colonoscopy at the [facility].   I spoke with the patient via phone today and discussed the pros/cons of each, and he preferred to proceed with a Cologuard.  If positive, we will schedule for colonoscopy.  If negative, recommend considerationg of repeat Cologuard in 3 years, based on his health at that time.  Vaughan Basta,  Can you please get Jared Stevens set up for a Cologuard?

## 2021-08-02 DIAGNOSIS — H02831 Dermatochalasis of right upper eyelid: Secondary | ICD-10-CM | POA: Diagnosis not present

## 2021-08-02 DIAGNOSIS — H401231 Low-tension glaucoma, bilateral, mild stage: Secondary | ICD-10-CM | POA: Diagnosis not present

## 2021-08-02 DIAGNOSIS — H2513 Age-related nuclear cataract, bilateral: Secondary | ICD-10-CM | POA: Diagnosis not present

## 2021-08-02 DIAGNOSIS — H5203 Hypermetropia, bilateral: Secondary | ICD-10-CM | POA: Diagnosis not present

## 2021-08-04 DIAGNOSIS — R3914 Feeling of incomplete bladder emptying: Secondary | ICD-10-CM | POA: Diagnosis not present

## 2021-08-04 DIAGNOSIS — R8279 Other abnormal findings on microbiological examination of urine: Secondary | ICD-10-CM | POA: Diagnosis not present

## 2021-08-26 DIAGNOSIS — I5042 Chronic combined systolic (congestive) and diastolic (congestive) heart failure: Secondary | ICD-10-CM | POA: Diagnosis not present

## 2021-08-26 DIAGNOSIS — C3491 Malignant neoplasm of unspecified part of right bronchus or lung: Secondary | ICD-10-CM | POA: Diagnosis not present

## 2021-09-07 ENCOUNTER — Encounter: Payer: Medicare Other | Admitting: Nurse Practitioner

## 2021-09-14 ENCOUNTER — Ambulatory Visit (INDEPENDENT_AMBULATORY_CARE_PROVIDER_SITE_OTHER): Payer: Medicare Other

## 2021-09-14 DIAGNOSIS — I5042 Chronic combined systolic (congestive) and diastolic (congestive) heart failure: Secondary | ICD-10-CM

## 2021-09-14 DIAGNOSIS — Z9181 History of falling: Secondary | ICD-10-CM

## 2021-09-14 DIAGNOSIS — I2510 Atherosclerotic heart disease of native coronary artery without angina pectoris: Secondary | ICD-10-CM

## 2021-09-14 NOTE — Chronic Care Management (AMB) (Signed)
?Chronic Care Management  ? ?CCM RN Visit Note ? ?09/14/2021 ?Name: Jared Stevens MRN: [id number] DOB: 08-03-1950 ? ?Subjective: ?Jared Stevens is a 71 y.o. year old male who is a primary care patient of [name], [name], NP. The care management team was consulted for assistance with disease management and care coordination needs.   ? ?Engaged with patient by telephone for follow up visit in response to provider referral for case management and/or care coordination services.  ? ?Consent to Services:  ?The patient was given information about Chronic Care Management services, agreed to services, and gave verbal consent prior to initiation of services.  Please see initial visit note for detailed documentation.  ? ?Patient agreed to services and verbal consent obtained.  ? ?Assessment: Review of patient past medical history, allergies, medications, health status, including review of consultants reports, laboratory and other test data, was performed as part of comprehensive evaluation and provision of chronic care management services.  ? ?SDOH (Social Determinants of Health) assessments and interventions performed:   ? ?CCM Care Plan ? ?No Known Allergies ? ?Outpatient Encounter Medications as of 09/14/2021  ?Medication Sig Note  ? albuterol (VENTOLIN HFA) 108 (90 Base) MCG/ACT inhaler Inhale 1-2 puffs into the lungs every 6 (six) hours as needed for wheezing or shortness of breath. Patient states he takes only as needed.   ? amitriptyline (ELAVIL) 50 MG tablet Take 50 mg by mouth at bedtime as needed.    ? budesonide-formoterol (SYMBICORT) 160-4.5 MCG/ACT inhaler INAHLE 2 PUFFS BY MOUTH TWICE A DAY IN THE MORNING AND EVENING   ? carvedilol (COREG) 3.125 MG tablet Take 1 tablet (3.125 mg total) by mouth 2 (two) times daily with a meal.   ? Cyanocobalamin (B-12) 2500 MCG SUBL Place under the tongue.   ? digoxin (LANOXIN) 0.125 MG tablet Take 1 tablet (0.125 mg total) by mouth daily.   ? finasteride (PROSCAR) 5 MG tablet  Take 5 mg by mouth daily.   ? latanoprost (XALATAN) 0.005 % ophthalmic solution Place 1 drop into both eyes daily.   ? rosuvastatin (CRESTOR) 40 MG tablet TAKE ONE TABLET BY MOUTH EVERY DAY   ? spironolactone (ALDACTONE) 25 MG tablet TAKE 0.5 TABLETS (12.5 MG TOTAL) BY MOUTH EVERY OTHER DAY.   ? tamsulosin (FLOMAX) 0.4 MG CAPS capsule Take 0.4 mg by mouth at bedtime.   ? warfarin (COUMADIN) 5 MG tablet Monday-Thursday take 1tab daily and Friday-Sunday take 1.5tab daily   ? furosemide (LASIX) 40 MG tablet Take 1 tablet (40 mg total) by mouth daily. 09/14/2021: Patient states he is still taking  ? ?No facility-administered encounter medications on file as of 09/14/2021.  ? ? ?Patient Active Problem List  ? Diagnosis Date Noted  ? Peripheral neuropathy due to chemotherapy ([place]) 07/22/2021  ? CAD (coronary artery disease) 05/11/2021  ? Benign prostatic hyperplasia without lower urinary tract symptoms 01/19/2021  ? Inguinal hernia of left side without obstruction or gangrene 01/19/2021  ? Acute urinary retention 07/28/2020  ? Long term current use of anticoagulant therapy 01/01/2020  ? Chronic combined systolic and diastolic CHF (congestive heart failure) ([place]) 10/31/2019  ? Neurocognitive deficits 11/06/2018  ? Encephalopathy 10/30/2018  ? History of CVA with residual deficit 10/21/2018  ? 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 Recurrent right pleural effusion   ? History of lung cancer 09/27/2018  ? Elevated brain natriuretic peptide (BNP) level 09/27/2018  ? COPD (chronic obstructive pulmonary disease) ([place]) 09/27/2018  ? Hyperlipidemia 09/27/2018  ? Tobacco use  disorder, continuous 09/27/2018  ? Non-small cell lung cancer, right ([place]) 08/26/2018  ? ? ?Conditions to be addressed/monitored:CHF, CAD, and HTN ? ?Care Plan : RNCM plan of care  ?Updates made by Dannielle Karvonen, RN since 09/14/2021 12:00 AM  ?  ? ?Problem: Chronic disease management education and care coordination needs.   ?Priority: High  ?   ? ?Long-Range Goal: Development of plan of care to address chronic  disease management and care coordination needs.   ?Start Date: 07/27/2021  ?Expected End Date: 10/26/2021  ?Priority: High  ?Note:   ?Current Barriers:  ?Knowledge Deficits related to plan of care for management of CHF, CAD, and falls  ?Patient states he is doing well. He states he continues to check his blood pressure 2-3 times per week and states his blood pressure continues to range  in the 120/70-80's.  Patient denies any symptoms or concerns. Denies any falls.  Per chart review, last outreach with primary care provider was 07/22/21.    ?RNCM Clinical Goal(s):  ?Patient will verbalize understanding of plan for management of CHF, HTN, and falls as evidenced by patient self report and / or notation in chart ?take all medications exactly as prescribed and will call provider for medication related questions as evidenced by patient self report and/ or notation in chart    ?attend all scheduled medical appointments:   as evidenced by patient self report and/ or notation in chart        ?continue to work with Consulting civil engineer and/or Social Worker to address care management and care coordination needs related to CHF, CAD, and falls as evidenced by adherence to CM Team Scheduled appointments     through collaboration with Consulting civil engineer, provider, and care team.  ? ?Interventions: ?1:1 collaboration with primary care provider regarding development and update of comprehensive plan of care as evidenced by provider attestation and co-signature ?Inter-disciplinary care team collaboration (see longitudinal plan of care) ?Evaluation of current treatment plan related to  self management and patient's adherence to plan as established by provider ? ? ?CAD Interventions: (Status:  New goal.) Long Term Goal ?Medications reviewed including medications utilized in CAD treatment plan ?Reviewed medications and discussed compliance ?Reviewed Importance of attending all  scheduled provider appointments ? ? ?Heart Failure Interventions:  (Status:  Goal on track:  Yes.) Long Term Goal ?Reviewed medications with patient and discussed importance of compliance ?Discussed plans with patient for ongoing care management follow up and provided patient with direct contact information for care management team ?Reviewed scheduled/upcoming provider appointments  ?Discussed heart failure symptoms.  Reminded patient  to weigh daily and notify provider for 3 lbs weight gain overnight/ 5 lbs weight gain in a week.  ? ?Falls Interventions:  (Status:  Goal on track:  Yes.) Long Term Goal ?Reviewed medications and discussed potential side effects of medications such as dizziness and frequent urination ?Assessed for falls since last outreach with RNCM.  ?Reviewed scheduled/upcoming provider appointments   ?Discussed plans with patient for ongoing care management follow up and provided patient with direct contact information for care management team ? ? ?Patient Goals/Self-Care Activities: ? Continue to take medications as prescribed   ?Call pharmacy for medication refills 3-7 days in advance of running out of medications ?Call provider office for new concerns or questions  ?- Follow up with your providers as recommended ?Continue to follow a low salt diet.  ?- Weigh self daily and record ?- Monitor for signs/ symptoms of heart failure:  weight gain 3 lbs overnight or 5 lbs in a week, swelling in feet, legs ankles, waist hand,  increase shortness of breath, fatigue/ weakness ( call provider for mild to moderate symptoms. Call 911 for severe symptoms) ? ?  ? ? ?Plan:The patient has been provided with contact information for the care management team and has been advised to call with any health related questions or concerns.  ?The care management team will reach out to the patient again over the next 2 months . ?[name] RN,BSN,CCM ?RN Case Manager ?[facility] ?[phone] ? ? ? ? ? ? ? ? ? ?

## 2021-09-14 NOTE — Patient Instructions (Signed)
Visit Information ? ?Thank you for taking time to visit with me today. Please don't hesitate to contact me if I can be of assistance to you before our next scheduled telephone appointment. ? ?Following are the goals we discussed today:  ? Continue to take medications as prescribed   ?Call pharmacy for medication refills 3-7 days in advance of running out of medications ?Call provider office for new concerns or questions  ?- Follow up with your providers as recommended ?Continue to follow a low salt diet.  ?- Weigh self daily and record ?- Monitor for signs/ symptoms of heart failure:  weight gain 3 lbs overnight or 5 lbs in a week, swelling in feet, legs ankles, waist hand,  increase shortness of breath, fatigue/ weakness ( call provider for mild to moderate symptoms. Call 911 for severe symptoms) ? ?Our next appointment is by telephone on 11/16/21 at 11:00 am ? ?Please call the care guide team at [phone] if you need to cancel or reschedule your appointment.  ? ?If you are experiencing a Mental Health or [place] or need someone to talk to, please call the Suicide and Crisis Lifeline: 988 ?call 1-800-273-TALK (toll free, 24 hour hotline)  ? ?Patient verbalizes understanding of instructions and care plan provided today and agrees to view in [facility]. Active MyChart status confirmed with patient.   ? ?[name] RN,BSN,CCM ?RN Case Manager ?[place] ?[phone] ? ?

## 2021-09-20 ENCOUNTER — Ambulatory Visit (INDEPENDENT_AMBULATORY_CARE_PROVIDER_SITE_OTHER): Payer: Medicare Other

## 2021-09-20 DIAGNOSIS — Z7901 Long term (current) use of anticoagulants: Secondary | ICD-10-CM | POA: Diagnosis not present

## 2021-09-20 LAB — POCT INR: INR: 2.8 (ref 2.0–3.0)

## 2021-09-20 NOTE — Progress Notes (Signed)
INR/Prothrombin Time ?Pt here for INR check per Wilfred Lacy, NP ?  ?Goal INR =2.0- 3.0 ?  ?Last INR =3.0 ?  ?Pt currently takes Coumadin  ?  ?Date of last Coumadin dose = 09/20/2021 ?  ?Pt denies recent antibiotics, no dietary changes and no unusual bruising / bleeding. ?  ?INR today = 2.8 ?  ?Pt advised per [facility], maintain current coumadin dose as follows Monday- Thursday take 1 tab daily Friday take 1.5 tabs daily. Pt is aware of dosing instructions and has verbalized understanding.  ? ?Eva Vallee L. CMA/CPT  ? ?

## 2021-09-25 DIAGNOSIS — I5042 Chronic combined systolic (congestive) and diastolic (congestive) heart failure: Secondary | ICD-10-CM

## 2021-09-25 DIAGNOSIS — I2510 Atherosclerotic heart disease of native coronary artery without angina pectoris: Secondary | ICD-10-CM

## 2021-10-05 ENCOUNTER — Other Ambulatory Visit: Payer: Self-pay | Admitting: Nurse Practitioner

## 2021-10-05 DIAGNOSIS — I5042 Chronic combined systolic (congestive) and diastolic (congestive) heart failure: Secondary | ICD-10-CM

## 2021-10-07 NOTE — Telephone Encounter (Signed)
Chart supports rx refill ?Last ov: 07/22/21 ?Last refill: 09/12/21 ?

## 2021-10-20 ENCOUNTER — Ambulatory Visit (INDEPENDENT_AMBULATORY_CARE_PROVIDER_SITE_OTHER): Payer: Medicare Other

## 2021-10-20 DIAGNOSIS — Z7901 Long term (current) use of anticoagulants: Secondary | ICD-10-CM | POA: Diagnosis not present

## 2021-10-20 LAB — POCT INR: INR: 2.3 (ref 2.0–3.0)

## 2021-10-20 NOTE — Progress Notes (Signed)
Pt is here for INR check. Capillary stick performed on left middle finger. Performed by [place] CMA/CPT at 1:40pm. Pt tolerated stick well.    Changes in Diet: No  Antibiotics: No  Unusual bruising/Bleeding: No  Goal INR =2.0-3.0    Last INR =2.8   Pt currently takes Coumadin   Date of last Coumadin dose = 10/20/2021   INR today = 2.3   Pt advised per Wilfred Lacy, NP, maintain current coumadin dose as follows Monday-Thursday take 1tab daily and Friday-Sunday take 1.5tab daily . Pt is aware of dosing instructions and has verbalized understanding.    Stasia Cavalier, RMA

## 2021-11-07 ENCOUNTER — Other Ambulatory Visit: Payer: Self-pay

## 2021-11-07 ENCOUNTER — Inpatient Hospital Stay: Payer: Medicare Other

## 2021-11-07 ENCOUNTER — Encounter (HOSPITAL_COMMUNITY): Payer: Self-pay

## 2021-11-07 ENCOUNTER — Ambulatory Visit (HOSPITAL_COMMUNITY)
Admission: RE | Admit: 2021-11-07 | Discharge: 2021-11-07 | Disposition: A | Discharge status: Home / Self Care | Payer: Medicare Other | Source: Ambulatory Visit | Attending: Physician Assistant | Admitting: Physician Assistant

## 2021-11-07 ENCOUNTER — Inpatient Hospital Stay: Payer: Medicare Other | Attending: Internal Medicine

## 2021-11-07 DIAGNOSIS — C3491 Malignant neoplasm of unspecified part of right bronchus or lung: Secondary | ICD-10-CM

## 2021-11-07 DIAGNOSIS — Z85118 Personal history of other malignant neoplasm of bronchus and lung: Secondary | ICD-10-CM | POA: Insufficient documentation

## 2021-11-07 DIAGNOSIS — C3490 Malignant neoplasm of unspecified part of unspecified bronchus or lung: Secondary | ICD-10-CM | POA: Diagnosis not present

## 2021-11-07 DIAGNOSIS — Z923 Personal history of irradiation: Secondary | ICD-10-CM | POA: Insufficient documentation

## 2021-11-07 DIAGNOSIS — Z9221 Personal history of antineoplastic chemotherapy: Secondary | ICD-10-CM | POA: Insufficient documentation

## 2021-11-07 LAB — CBC WITH DIFFERENTIAL (CANCER CENTER ONLY)
Abs Immature Granulocytes: 0.01 10*3/uL (ref 0.00–0.07)
Basophils Absolute: 0.1 10*3/uL (ref 0.0–0.1)
Basophils Relative: 1 %
Eosinophils Absolute: 0.2 10*3/uL (ref 0.0–0.5)
Eosinophils Relative: 4 %
HCT: 41.8 % (ref 39.0–52.0)
Hemoglobin: 14.1 g/dL (ref 13.0–17.0)
Immature Granulocytes: 0 %
Lymphocytes Relative: 18 %
Lymphs Abs: 1.2 10*3/uL (ref 0.7–4.0)
MCH: 28.6 pg (ref 26.0–34.0)
MCHC: 33.7 g/dL (ref 30.0–36.0)
MCV: 84.8 fL (ref 80.0–100.0)
Monocytes Absolute: 0.5 10*3/uL (ref 0.1–1.0)
Monocytes Relative: 8 %
Neutro Abs: 4.7 10*3/uL (ref 1.7–7.7)
Neutrophils Relative %: 69 %
Platelet Count: 323 10*3/uL (ref 150–400)
RBC: 4.93 MIL/uL (ref 4.22–5.81)
RDW: 17.4 % — ABNORMAL HIGH (ref 11.5–15.5)
WBC Count: 6.7 10*3/uL (ref 4.0–10.5)
nRBC: 0 % (ref 0.0–0.2)

## 2021-11-07 LAB — CMP (CANCER CENTER ONLY)
ALT: 11 U/L (ref 0–44)
AST: 16 U/L (ref 15–41)
Albumin: 3.7 g/dL (ref 3.5–5.0)
Alkaline Phosphatase: 78 U/L (ref 38–126)
Anion gap: 5 (ref 5–15)
BUN: 19 mg/dL (ref 8–23)
CO2: 33 mmol/L — ABNORMAL HIGH (ref 22–32)
Calcium: 9.7 mg/dL (ref 8.9–10.3)
Chloride: 104 mmol/L (ref 98–111)
Creatinine: 1.22 mg/dL (ref 0.61–1.24)
GFR, Estimated: >60 mL/min (ref >60)
Glucose, Bld: 144 mg/dL — ABNORMAL HIGH (ref 70–99)
Potassium: 3.7 mmol/L (ref 3.5–5.1)
Sodium: 142 mmol/L (ref 135–145)
Total Bilirubin: 0.4 mg/dL (ref 0.3–1.2)
Total Protein: 7.1 g/dL (ref 6.5–8.1)

## 2021-11-07 MED ORDER — IOHEXOL 300 MG/ML  SOLN
75.0000 mL | Freq: Once | INTRAMUSCULAR | Status: AC | PRN
  Administered 2021-11-07: 75 mL via INTRAVENOUS

## 2021-11-09 ENCOUNTER — Inpatient Hospital Stay (HOSPITAL_BASED_OUTPATIENT_CLINIC_OR_DEPARTMENT_OTHER): Payer: Medicare Other | Admitting: Internal Medicine

## 2021-11-09 ENCOUNTER — Inpatient Hospital Stay: Payer: Medicare Other

## 2021-11-09 ENCOUNTER — Other Ambulatory Visit: Payer: Self-pay

## 2021-11-09 VITALS — BP 103/73 | HR 79 | Temp 98.1°F | Resp 15 | Ht 73.0 in | Wt 135.8 lb

## 2021-11-09 DIAGNOSIS — Z85118 Personal history of other malignant neoplasm of bronchus and lung: Secondary | ICD-10-CM | POA: Diagnosis not present

## 2021-11-09 DIAGNOSIS — C3490 Malignant neoplasm of unspecified part of unspecified bronchus or lung: Secondary | ICD-10-CM

## 2021-11-09 DIAGNOSIS — Z923 Personal history of irradiation: Secondary | ICD-10-CM | POA: Diagnosis not present

## 2021-11-09 DIAGNOSIS — Z9221 Personal history of antineoplastic chemotherapy: Secondary | ICD-10-CM | POA: Diagnosis not present

## 2021-11-09 NOTE — Progress Notes (Signed)
[name] [phone]   [phone]  OFFICE PROGRESS NOTE  Stevens, Jared Brooke, NP [place] [address]  DIAGNOSIS: Stage IIIA non-small cell lung cancer, adenocarcinoma diagnosed in 2017  PRIOR THERAPY: status post a course of concurrent chemoradiation with weekly carboplatin and paclitaxel completed in early 2018 at [facility]. The patient has been in observation since that time.  CURRENT THERAPY: Observation.  INTERVAL HISTORY: Jared Stevens 71 y.o. male returns to the clinic today for follow-up visit.  The patient is feeling fine today with no concerning complaints except for mild fatigue.  He denied having any current chest pain, shortness of breath, cough or hemoptysis.  He has no nausea, vomiting, diarrhea or constipation.  He has no headache or visual changes.  He has no current fever or chills.  He has no weight loss or night sweats.  He is here today for evaluation with repeat CT scan of the chest for restaging of his disease.  MEDICAL HISTORY: Past Medical History:  Diagnosis Date   Acute systolic CHF (congestive heart failure) ([place])    2D ECHO 09/27/2018 revealed left ventricular ejection fraction of 15%   CHF exacerbation ([place]) 09/27/2018   Chronic obstructive pulmonary disease    Combined systolic and diastolic CHF    Mixed Ischemic/Non-Ischemic CM // Echo 09/2018: EF 15 // cMRI 10/2018: EF 11, inf scar // Echo 12/2018: EF 20-25, Gr 3 DD // Not a candidate for ICD due to comorbid illnesses    Coronary artery disease    cath 09/2018: RCA chronically occluded >> Med Rx   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   Tx with tPA and thrombectomy // Coumadin (managed by PCP)   Hx of recurrent R pleural effusion    s/p thoracentesis   Hypercholesteremia    Long term (current) use of anticoagulants 01/08/2020   Lung cancer ([place]) 03/2016   Mixed Ischemic and Non-Ischemic Cardiomyopathy    Non-small cell carcinoma of lung     Nonsustained ventricular tachycardia     ALLERGIES:  has No Known Allergies.  MEDICATIONS:  Current Outpatient Medications  Medication Sig Dispense Refill   albuterol (VENTOLIN HFA) 108 (90 Base) MCG/ACT inhaler Inhale 1-2 puffs into the lungs every 6 (six) hours as needed for wheezing or shortness of breath. Patient states he takes only as needed. 8 g 0   amitriptyline (ELAVIL) 50 MG tablet Take 50 mg by mouth at bedtime as needed.      budesonide-formoterol (SYMBICORT) 160-4.5 MCG/ACT inhaler INAHLE 2 PUFFS BY MOUTH TWICE A DAY IN THE MORNING AND EVENING 10.2 each 5   carvedilol (COREG) 3.125 MG tablet TAKE 1 TABLET (3.125 MG TOTAL) BY MOUTH 2 (TWO) TIMES DAILY WITH A MEAL. 180 tablet 3   Cyanocobalamin (B-12) 2500 MCG SUBL Place under the tongue.     digoxin (LANOXIN) 0.125 MG tablet TAKE 1 TABLET (0.125 MG TOTAL) BY MOUTH DAILY. 90 tablet 3   finasteride (PROSCAR) 5 MG tablet Take 5 mg by mouth daily.     furosemide (LASIX) 40 MG tablet Take 1 tablet (40 mg total) by mouth daily. 90 tablet 3   latanoprost (XALATAN) 0.005 % ophthalmic solution Place 1 drop into both eyes daily.  11   rosuvastatin (CRESTOR) 40 MG tablet TAKE ONE TABLET BY MOUTH EVERY DAY 90 tablet 3   spironolactone (ALDACTONE) 25 MG tablet TAKE 0.5 TABLETS (12.5 MG TOTAL) BY MOUTH EVERY OTHER DAY. 45 tablet  1   tamsulosin (FLOMAX) 0.4 MG CAPS capsule Take 0.4 mg by mouth at bedtime.     warfarin (COUMADIN) 5 MG tablet Monday-Thursday take 1tab daily and Friday-Sunday take 1.5tab daily 34 tablet 5   No current facility-administered medications for this visit.    SURGICAL HISTORY:  Past Surgical History:  Procedure Laterality Date   Arm Surgery     HERNIA REPAIR     IR ANGIO INTRA EXTRACRAN SEL COM CAROTID INNOMINATE UNI L MOD SED  10/21/2018   IR ANGIO VERTEBRAL SEL SUBCLAVIAN INNOMINATE UNI R MOD SED  10/21/2018   IR CT HEAD LTD  10/21/2018   IR PERCUTANEOUS ART THROMBECTOMY/INFUSION INTRACRANIAL INC DIAG ANGIO   10/21/2018   IR THORACENTESIS ASP PLEURAL SPACE W/IMG GUIDE  09/27/2018   RADIOLOGY WITH ANESTHESIA N/A 10/21/2018   Procedure: RADIOLOGY WITH ANESTHESIA;  Surgeon: Luanne Bras, MD;  Location: [facility];  Service: Radiology;  Laterality: N/A;   REPLANTATION THUMB     RIGHT/LEFT HEART CATH AND CORONARY ANGIOGRAPHY N/A 10/01/2018   Procedure: RIGHT/LEFT HEART CATH AND CORONARY ANGIOGRAPHY;  Surgeon: Troy Sine, MD;  Location: [facility];  Service: Cardiovascular;  Laterality: N/A;    REVIEW OF SYSTEMS:  A comprehensive review of systems was negative except for: Constitutional: positive for fatigue Respiratory: positive for dyspnea on exertion   PHYSICAL EXAMINATION: General appearance: alert, cooperative, fatigued, and no distress Head: Normocephalic, without obvious abnormality, atraumatic Neck: no adenopathy, no JVD, supple, symmetrical, trachea midline, and thyroid not enlarged, symmetric, no tenderness/mass/nodules Lymph nodes: Cervical, supraclavicular, and axillary nodes normal. Resp: clear to auscultation bilaterally Back: symmetric, no curvature. ROM normal. No CVA tenderness. Cardio: regular rate and rhythm, S1, S2 normal, no murmur, click, rub or gallop GI: soft, non-tender; bowel sounds normal; no masses,  no organomegaly Extremities: extremities normal, atraumatic, no cyanosis or edema  ECOG PERFORMANCE STATUS: 1 - Symptomatic but completely ambulatory  Blood pressure 103/73, pulse 79, temperature 98.1 F (36.7 C), temperature source Temporal, resp. rate 15, height 6\' 1"  (1.854 m), weight 135 lb 12.8 oz (61.6 kg), SpO2 97 %.  LABORATORY DATA: Lab Results  Component Value Date   WBC 6.7 11/07/2021   HGB 14.1 11/07/2021   HCT 41.8 11/07/2021   MCV 84.8 11/07/2021   PLT 323 11/07/2021      Chemistry      Component Value Date/Time   NA 142 11/07/2021 1104   NA 138 06/01/2021 1239   K 3.7 11/07/2021 1104   CL 104 11/07/2021 1104   CO2 33 (H) 11/07/2021 1104    BUN 19 11/07/2021 1104   BUN 17 06/01/2021 1239   CREATININE 1.22 11/07/2021 1104      Component Value Date/Time   CALCIUM 9.7 11/07/2021 1104   ALKPHOS 78 11/07/2021 1104   AST 16 11/07/2021 1104   ALT 11 11/07/2021 1104   BILITOT 0.4 11/07/2021 1104       RADIOGRAPHIC STUDIES CT Chest W Contrast  Result Date: 11/08/2021 CLINICAL DATA:  Primary Cancer Type: Lung Imaging Indication: Routine surveillance Interval therapy since last imaging? No Initial Cancer Diagnosis Date: 2017 Detailed Pathology: Stage IIIA non-small cell lung cancer, adenocarcinoma. Primary Tumor location:  Right lung. Surgeries: No thoracic. Chemotherapy: Yes; Ongoing? No; Most recent administration: 2018 Immunotherapy? No Radiation therapy?  Yes; Date Range: 2017; Target: Right lung * Tracking Code: BO * EXAM: CT CHEST WITH CONTRAST TECHNIQUE: Multidetector CT imaging of the chest was performed during intravenous contrast administration. RADIATION DOSE REDUCTION: This  exam was performed according to the departmental dose-optimization program which includes automated exposure control, adjustment of the mA and/or kV according to patient size and/or use of iterative reconstruction technique. CONTRAST:  60mL OMNIPAQUE IOHEXOL 300 MG/ML  SOLN COMPARISON:  Most recent CT chest 10/19/2020. 05/23/2018 PET-CT. FINDINGS: Cardiovascular: The heart is normal in size. No pericardial effusion. Stable mild tortuosity and calcification of the thoracic aorta but no aneurysm or dissection. Stable coronary artery calcifications. Mediastinum/Nodes: No mediastinal or hilar lymphadenopathy. The esophagus is grossly. Lungs/Pleura: Stable post treatment changes involving the right hemithorax with loss of volume and dense radiation fibrosis involving the right hilum, infrahilar region and right paramediastinal lung. No findings suspicious for recurrent tumor in this area. However, there is increasing soft tissue density in the right upper lobe  medially adjacent to the aortic arch and narrowed SVC. This has slowly enlarged over the past CT scans and is concerning for possible neoplastic process. It measures approximately 3.8 x 3.4 x 1.9 cm. In 2021 this measured 2.3 x 2.2 x 1.0 cm. Recommend either continued close follow-up (repeat CT scan and 3-4 months) versus PET-CT. No pulmonary nodules to suggest pulmonary metastatic disease. No acute pulmonary process. There is a stable chronic right pleural effusion. Stable underlying emphysematous changes. Upper Abdomen: No significant upper abdominal findings. Stable vascular calcifications. Musculoskeletal: No chest wall mass, supraclavicular or axillary adenopathy. The bony thorax is intact. IMPRESSION: 1. Stable post treatment changes involving the right hemithorax. No findings suspicious for recurrent tumor in this area. 2. Progressive soft tissue density in the right upper lobe medially adjacent to the aortic arch and SVC. This is worrisome for possible neoplastic process. Recommend either continued close follow-up (repeat CT scan in 3-4 months) versus PET-CT. 3. No pulmonary nodules to suggest pulmonary metastatic disease. 4. Stable emphysematous changes. 5. Stable chronic right pleural effusion. 6. Stable atherosclerotic calcifications involving the aorta and branch vessels including the coronary arteries. Aortic Atherosclerosis (ICD10-I70.0) and Emphysema (ICD10-J43.9). Electronically Signed   By: Marijo Sanes M.D.   On: 11/08/2021 10:32     ASSESSMENT AND PLAN: This is a very pleasant 71 years old African-American male with history of a stage IIIa non-small cell lung cancer, adenocarcinoma status post a course of concurrent chemoradiation in [place] in 2017. The patient has been on observation since his treatment in 2017. He had repeat CT scan of the chest performed recently. I personally and independently reviewed the scan images and discussed the result and showed the images to the patient  today. His scan showed stable appearance of the treatment changes involving the right hemothorax but there was progressive soft tissue density in the right upper lobe medially adjacent to the aortic arch and SVC worrisome for possible neoplastic process. I recommended for the patient to have repeat PET scan in the next 2 weeks for evaluation of this lesion and to rule out any disease recurrence. I will see him back for follow-up visit in around 3 weeks for further evaluation and discussion of his PET scan results and recommendation regarding his condition. The patient was advised to call immediately if he has any concerning symptoms in the interval. The patient voices understanding of current disease status and treatment options and is in agreement with the current care plan. All questions were answered. The patient knows to call the clinic with any problems, questions or concerns. We can certainly see the patient much sooner if necessary.  Disclaimer: This note was dictated with voice recognition software. Similar sounding  words can inadvertently be transcribed and may not be corrected upon review.

## 2021-11-10 ENCOUNTER — Telehealth: Payer: Self-pay | Admitting: Internal Medicine

## 2021-11-10 NOTE — Telephone Encounter (Signed)
Scheduled per 06/14 los, patient has been called and notified.

## 2021-11-16 ENCOUNTER — Telehealth: Payer: Medicare Other

## 2021-11-16 ENCOUNTER — Telehealth: Payer: Self-pay

## 2021-11-16 NOTE — Telephone Encounter (Signed)
  Care Management   Follow Up Note   11/16/2021 Name: Jared Stevens MRN: [id number] DOB: 03-05-1951   Referred by: Flossie Buffy, NP Reason for referral : Chronic Care Management   An unsuccessful telephone outreach was attempted today. The patient was referred to the case management team for assistance with care management and care coordination.   Follow Up Plan: The care management team will reach out to the patient again over the next 14  days.   [name] RN,BSN,CCM RN Case Manager [place] [phone]

## 2021-11-18 ENCOUNTER — Encounter (HOSPITAL_COMMUNITY): Payer: Medicare Other

## 2021-11-18 ENCOUNTER — Telehealth: Payer: Self-pay | Admitting: Medical Oncology

## 2021-11-22 ENCOUNTER — Telehealth: Payer: Self-pay | Admitting: Internal Medicine

## 2021-11-23 ENCOUNTER — Encounter: Payer: Self-pay | Admitting: Nurse Practitioner

## 2021-11-23 ENCOUNTER — Ambulatory Visit (INDEPENDENT_AMBULATORY_CARE_PROVIDER_SITE_OTHER): Payer: Medicare Other | Admitting: Nurse Practitioner

## 2021-11-23 ENCOUNTER — Ambulatory Visit (INDEPENDENT_AMBULATORY_CARE_PROVIDER_SITE_OTHER): Payer: Medicare Other

## 2021-11-23 VITALS — BP 116/76 | HR 82 | Temp 97.8°F | Ht 73.0 in | Wt 135.0 lb

## 2021-11-23 DIAGNOSIS — Z7901 Long term (current) use of anticoagulants: Secondary | ICD-10-CM

## 2021-11-23 DIAGNOSIS — I6930 Unspecified sequelae of cerebral infarction: Secondary | ICD-10-CM

## 2021-11-23 DIAGNOSIS — I5042 Chronic combined systolic (congestive) and diastolic (congestive) heart failure: Secondary | ICD-10-CM

## 2021-11-23 DIAGNOSIS — E1165 Type 2 diabetes mellitus with hyperglycemia: Secondary | ICD-10-CM | POA: Diagnosis not present

## 2021-11-23 DIAGNOSIS — I2510 Atherosclerotic heart disease of native coronary artery without angina pectoris: Secondary | ICD-10-CM

## 2021-11-23 DIAGNOSIS — Z9181 History of falling: Secondary | ICD-10-CM

## 2021-11-23 LAB — POCT GLYCOSYLATED HEMOGLOBIN (HGB A1C): Hemoglobin A1C: 6.7 % — AB (ref 4.0–5.6)

## 2021-11-23 NOTE — Assessment & Plan Note (Addendum)
Repeat INR: 2.5 Maintain coumadin dose F/up in 53month

## 2021-11-23 NOTE — Progress Notes (Signed)
Established Patient Visit  Patient: Jared Stevens   DOB: June 30, 1950   71 y.o. Male  MRN: [id number] Visit Date: 11/25/2021  Subjective:    Chief Complaint  Patient presents with   Office Visit    INR f/u No concerns   HPI Type 2 diabetes mellitus with hyperglycemia, without long-term current use of insulin ([place]) New diagnosis. hgbA1c at 6.7% I explained diabetes type 2 disease process and possible complications. Advised about need for nutritionist referral and annual eye exam. He agreed with nutritionist referral. He has upcoming appt with [facility] ophthalmology. Collect urine microalbumin today eGFr >60 BP at goal LDL at goal with crestor 25m F/up in 370month Long term current use of anticoagulant therapy Repeat INR: 2.5 Maintain coumadin dose F/up in 68m62montht Readings from Last 3 Encounters:  11/23/21 135 lb (61.2 kg)  11/09/21 135 lb 12.8 oz (61.6 kg)  07/22/21 132 lb 6.4 oz (60.1 kg)    BP Readings from Last 3 Encounters:  11/23/21 116/76  11/09/21 103/73  07/22/21 110/62    Reviewed medical, surgical, and social history today  Medications: Outpatient Medications Prior to Visit  Medication Sig   albuterol (VENTOLIN HFA) 108 (90 Base) MCG/ACT inhaler Inhale 1-2 puffs into the lungs every 6 (six) hours as needed for wheezing or shortness of breath. Patient states he takes only as needed.   amitriptyline (ELAVIL) 50 MG tablet Take 50 mg by mouth at bedtime as needed.    budesonide-formoterol (SYMBICORT) 160-4.5 MCG/ACT inhaler INAHLE 2 PUFFS BY MOUTH TWICE A DAY IN THE MORNING AND EVENING   carvedilol (COREG) 3.125 MG tablet TAKE 1 TABLET (3.125 MG TOTAL) BY MOUTH 2 (TWO) TIMES DAILY WITH A MEAL.   Cyanocobalamin (B-12) 2500 MCG SUBL Place under the tongue.   digoxin (LANOXIN) 0.125 MG tablet TAKE 1 TABLET (0.125 MG TOTAL) BY MOUTH DAILY.   finasteride (PROSCAR) 5 MG tablet Take 5 mg by mouth daily.   latanoprost (XALATAN) 0.005 % ophthalmic solution  Place 1 drop into both eyes daily.   rosuvastatin (CRESTOR) 40 MG tablet TAKE ONE TABLET BY MOUTH EVERY DAY   spironolactone (ALDACTONE) 25 MG tablet TAKE 0.5 TABLETS (12.5 MG TOTAL) BY MOUTH EVERY OTHER DAY.   tamsulosin (FLOMAX) 0.4 MG CAPS capsule Take 0.4 mg by mouth at bedtime.   [DISCONTINUED] warfarin (COUMADIN) 5 MG tablet Monday-Thursday take 1tab daily and Friday-Sunday take 1.5tab daily   furosemide (LASIX) 40 MG tablet Take 1 tablet (40 mg total) by mouth daily.   No facility-administered medications prior to visit.   Reviewed past medical and social history.   ROS per HPI above      Objective:  BP 116/76 (BP Location: Right Arm, Patient Position: Sitting, Cuff Size: Small)   Pulse 82   Temp 97.8 F (36.6 C) (Temporal)   Ht 6' 1"  (1.854 m)   Wt 135 lb (61.2 kg)   SpO2 95%   BMI 17.81 kg/m      Physical Exam  Results for orders placed or performed in visit on 11/23/21  Protime-INR  Result Value Ref Range   INR 2.5 (H) 0.8 - 1.0 ratio   Prothrombin Time 26.2 (H) 9.6 - 13.1 sec  Microalbumin / creatinine urine ratio  Result Value Ref Range   Microalb, Ur 2.9 (H) 0.0 - 1.9 mg/dL   Creatinine,U 146.8 mg/dL   Microalb Creat Ratio 2.0 0.0 - 30.0 mg/g  POCT glycosylated hemoglobin (Hb A1C)  Result Value Ref Range   Hemoglobin A1C 6.7 (A) 4.0 - 5.6 %      Assessment & Plan:    Problem List Items Addressed This Visit       Endocrine   Type 2 diabetes mellitus with hyperglycemia, without long-term current use of insulin ([place]) - Primary    New diagnosis. hgbA1c at 6.7% I explained diabetes type 2 disease process and possible complications. Advised about need for nutritionist referral and annual eye exam. He agreed with nutritionist referral. He has upcoming appt with [facility] ophthalmology. Collect urine microalbumin today eGFr >60 BP at goal LDL at goal with crestor 77m F/up in 39month     Relevant Orders   POCT glycosylated hemoglobin (Hb A1C) (Completed)    Referral to Nutrition and Diabetes Services   Microalbumin / creatinine urine ratio (Completed)     Other   History of CVA with residual deficit   Relevant Orders   Protime-INR (Completed)   Long term current use of anticoagulant therapy    Repeat INR: 2.5 Maintain coumadin dose F/up in 57m63month   Relevant Medications   warfarin (COUMADIN) 5 MG tablet   Other Relevant Orders   Protime-INR (Completed)   Return in about 3 months (around 02/23/2022) for DM and HTN, hyperlipidemia (fasting).     ChaWilfred LacyP

## 2021-11-23 NOTE — Chronic Care Management (AMB) (Signed)
Chronic Care Management   CCM RN Visit Note  11/23/2021 Name: Jared Stevens MRN: [id number] DOB: [birth year]  Subjective: Jared Stevens is a 71 y.o. year old male who is a primary care patient of [name], [name], NP. The care management team was consulted for assistance with disease management and care coordination needs.    Engaged with patient by telephone for follow up visit in response to provider referral for case management and/or care coordination services.   Consent to Services:  The patient was given information about Chronic Care Management services, agreed to services, and gave verbal consent prior to initiation of services.  Please see initial visit note for detailed documentation.   Patient agreed to services and verbal consent obtained.   Assessment: Review of patient past medical history, allergies, medications, health status, including review of consultants reports, laboratory and other test data, was performed as part of comprehensive evaluation and provision of chronic care management services.   SDOH (Social Determinants of Health) assessments and interventions performed:    CCM Care Plan  No Known Allergies  Outpatient Encounter Medications as of 11/23/2021  Medication Sig Note   albuterol (VENTOLIN HFA) 108 (90 Base) MCG/ACT inhaler Inhale 1-2 puffs into the lungs every 6 (six) hours as needed for wheezing or shortness of breath. Patient states he takes only as needed.    amitriptyline (ELAVIL) 50 MG tablet Take 50 mg by mouth at bedtime as needed.     budesonide-formoterol (SYMBICORT) 160-4.5 MCG/ACT inhaler INAHLE 2 PUFFS BY MOUTH TWICE A DAY IN THE MORNING AND EVENING    carvedilol (COREG) 3.125 MG tablet TAKE 1 TABLET (3.125 MG TOTAL) BY MOUTH 2 (TWO) TIMES DAILY WITH A MEAL.    Cyanocobalamin (B-12) 2500 MCG SUBL Place under the tongue.    digoxin (LANOXIN) 0.125 MG tablet TAKE 1 TABLET (0.125 MG TOTAL) BY MOUTH DAILY.    finasteride (PROSCAR) 5 MG tablet  Take 5 mg by mouth daily.    latanoprost (XALATAN) 0.005 % ophthalmic solution Place 1 drop into both eyes daily.    rosuvastatin (CRESTOR) 40 MG tablet TAKE ONE TABLET BY MOUTH EVERY DAY    spironolactone (ALDACTONE) 25 MG tablet TAKE 0.5 TABLETS (12.5 MG TOTAL) BY MOUTH EVERY OTHER DAY.    tamsulosin (FLOMAX) 0.4 MG CAPS capsule Take 0.4 mg by mouth at bedtime.    warfarin (COUMADIN) 5 MG tablet Monday-Thursday take 1tab daily and Friday-Sunday take 1.5tab daily    furosemide (LASIX) 40 MG tablet Take 1 tablet (40 mg total) by mouth daily. 09/14/2021: Patient states he is still taking   No facility-administered encounter medications on file as of 11/23/2021.    Patient Active Problem List   Diagnosis Date Noted   Peripheral neuropathy due to chemotherapy ([place]) 07/22/2021   CAD (coronary artery disease) 05/11/2021   Benign prostatic hyperplasia without lower urinary tract symptoms 01/19/2021   Inguinal hernia of left side without obstruction or gangrene 01/19/2021   Acute urinary retention 07/28/2020   Long term current use of anticoagulant therapy 01/01/2020   Chronic combined systolic and diastolic CHF (congestive heart failure) ([place]) 10/31/2019   Neurocognitive deficits 11/06/2018   Encephalopathy 10/30/2018   History of CVA with residual deficit 10/21/2018   Middle cerebral artery embolism, right 10/21/2018   Recurrent right pleural effusion    History of lung cancer 09/27/2018   Elevated brain natriuretic peptide (BNP) level 09/27/2018   COPD (chronic obstructive pulmonary disease) ([place]) 09/27/2018   Hyperlipidemia 09/27/2018   Tobacco use  disorder, continuous 09/27/2018   Non-small cell lung cancer, right ([place]) 08/26/2018    Conditions to be addressed/monitored:CHF, CAD, and falls  Care Plan : [facility] plan of care  Updates made by Dannielle Karvonen, RN since 11/23/2021 12:00 AM     Problem: Chronic disease management education and care coordination needs.   Priority: High      Long-Range Goal: Development of plan of care to address chronic  disease management and care coordination needs.   Start Date: 07/27/2021  Expected End Date: 12/26/2021  Priority: High  Note:   Current Barriers:  Knowledge Deficits related to plan of care for management of CHF, CAD, and falls  Patient states he is doing well.  He states he is scheduled to have a PET scan on 11/25/21. Per chart review patient has  Stage IIIA non-small cell lung cancer, adenocarcinoma diagnosed in 2017.   Patient denies any increase in heart failure symptoms. He reports weighing daily with current weight being 138 lbs.  Patient denies any symptoms or concerns. Denies any falls.  Patient reports next follow up visit with primary care provider is today 11/25/21.    RNCM Clinical Goal(s):  Patient will verbalize understanding of plan for management of CHF, HTN, and falls as evidenced by patient self report and / or notation in chart take all medications exactly as prescribed and will call provider for medication related questions as evidenced by patient self report and/ or notation in chart    attend all scheduled medical appointments:   as evidenced by patient self report and/ or notation in chart        continue to work with RN Care Manager and/or Social Worker to address care management and care coordination needs related to CHF, CAD, and falls as evidenced by adherence to CM Team Scheduled appointments     through collaboration with Consulting civil engineer, provider, and care team.   Interventions: 1:1 collaboration with primary care provider regarding development and update of comprehensive plan of care as evidenced by provider attestation and co-signature Inter-disciplinary care team collaboration (see longitudinal plan of care) Evaluation of current treatment plan related to  self management and patient's adherence to plan as established by provider   CAD Interventions: (Status:  Goal on track:  Yes.) Long Term  Goal Reviewed medications and discussed compliance Reviewed Importance of attending all scheduled provider appointments Monitor blood pressure at least 2-3 times per week Follow low salt/ heart healthy diet   Heart Failure Interventions:  (Status:  Goal on track:  Yes.) Long Term Goal Reviewed medications with patient and discussed importance of compliance Discussed plans with patient for ongoing care management follow up and provided patient with direct contact information for care management team Reviewed scheduled/upcoming provider appointments  Discussed heart failure symptoms.  Reminded patient  to weigh daily and notify provider for 3 lbs weight gain overnight/ 5 lbs weight gain in a week.  Advised patient to follow low salt diet.   Falls Interventions:  (Status:  Goal on track:  Yes.) Long Term Goal Reviewed medications and discussed potential side effects of medications such as dizziness and frequent urination Assessed for falls since last outreach with RNCM.  Reviewed scheduled/upcoming provider appointments   Discussed plans with patient for ongoing care management follow up and provided patient with direct contact information for care management team   Patient Goals/Self-Care Activities:  Continue to take medications as prescribed   Call pharmacy for medication refills 3-7 days in advance of running  out of medications Call provider office for new concerns or questions  - Follow up with your providers as recommended Continue to follow a low salt diet.  - Weigh self daily and record - Monitor for signs/ symptoms of heart failure:  weight gain 3 lbs overnight or 5 lbs in a week, swelling in feet, legs ankles, waist hand,  increase shortness of breath, fatigue/ weakness ( call provider for mild to moderate symptoms. Call 911 for severe symptoms)      Plan:The patient has been provided with contact information for the care management team and has been advised to call with any  health related questions or concerns.  The care management team will reach out to the patient again over the next 45 days.  [name] RN,BSN,CCM RN Case Manager [place] [phone]

## 2021-11-23 NOTE — Patient Instructions (Signed)
Visit Information  Thank you for taking time to visit with me today. Please don't hesitate to contact me if I can be of assistance to you before our next scheduled telephone appointment.  Following are the goals we discussed today:  Continue to take medications as prescribed   Call pharmacy for medication refills 3-7 days in advance of running out of medications Call provider office for new concerns or questions  - Follow up with your providers as recommended Continue to follow a low salt diet.  - Weigh self daily and record - Monitor for signs/ symptoms of heart failure:  weight gain 3 lbs overnight or 5 lbs in a week, swelling in feet, legs ankles, waist hand,  increase shortness of breath, fatigue/ weakness ( call provider for mild to moderate symptoms. Call 911 for severe symptoms)  Our next appointment is by telephone on 12/14/21 at 10:30 am  Please call the care guide team at [phone] if you need to cancel or reschedule your appointment.   If you are experiencing a Mental Health or [place] or need someone to talk to, please call the Suicide and Crisis Lifeline: 988 call 1-800-273-TALK (toll free, 24 hour hotline)   Patient verbalizes understanding of instructions and care plan provided today and agrees to view in [place]. Active MyChart status and patient understanding of how to access instructions and care plan via MyChart confirmed with patient.     [name] RN,BSN,CCM RN Case Manager [place]  [phone]

## 2021-11-23 NOTE — Patient Instructions (Signed)
Go to lab Keep log of daily meals. Take to your appt with nutritionist.  Diabetes Mellitus Basics  Diabetes mellitus, or diabetes, is a long-term (chronic) disease. It occurs when the body does not properly use sugar (glucose) that is released from food after you eat. Diabetes mellitus may be caused by one or both of these problems: Your pancreas does not make enough of a hormone called insulin. Your body does not react in a normal way to the insulin that it makes. Insulin lets glucose enter cells in your body. This gives you energy. If you have diabetes, glucose cannot get into cells. This causes high blood glucose (hyperglycemia). How to treat and manage diabetes You may need to take insulin or other diabetes medicines daily to keep your glucose in balance. If you are prescribed insulin, you will learn how to give yourself insulin by injection. You may need to adjust the amount of insulin you take based on the foods that you eat. You will need to check your blood glucose levels using a glucose monitor as told by your health care provider. The readings can help determine if you have low or high blood glucose. Generally, you should have these blood glucose levels: Before meals (preprandial): 80-130 mg/dL (4.4-7.2 mmol/L). After meals (postprandial): below 180 mg/dL (10 mmol/L). Hemoglobin A1c (HbA1c) level: less than 7%. Your health care provider will set treatment goals for you. Keep all follow-up visits. This is important. Follow these instructions at home: Diabetes medicines Take your diabetes medicines every day as told by your health care provider. List your diabetes medicines here: Name of medicine: ______________________________ Amount (dose): _______________ Time (a.m./p.m.): _______________ Notes: ___________________________________ Name of medicine: ______________________________ Amount (dose): _______________ Time (a.m./p.m.): _______________ Notes:  ___________________________________ Name of medicine: ______________________________ Amount (dose): _______________ Time (a.m./p.m.): _______________ Notes: ___________________________________ Insulin If you use insulin, list the types of insulin you use here: Insulin type: ______________________________ Amount (dose): _______________ Time (a.m./p.m.): _______________Notes: ___________________________________ Insulin type: ______________________________ Amount (dose): _______________ Time (a.m./p.m.): _______________ Notes: ___________________________________ Insulin type: ______________________________ Amount (dose): _______________ Time (a.m./p.m.): _______________ Notes: ___________________________________ Insulin type: ______________________________ Amount (dose): _______________ Time (a.m./p.m.): _______________ Notes: ___________________________________ Insulin type: ______________________________ Amount (dose): _______________ Time (a.m./p.m.): _______________ Notes: ___________________________________ Managing blood glucose  Check your blood glucose levels using a glucose monitor as told by your health care provider. Write down the times that you check your glucose levels here: Time: _______________ Notes: ___________________________________ Time: _______________ Notes: ___________________________________ Time: _______________ Notes: ___________________________________ Time: _______________ Notes: ___________________________________ Time: _______________ Notes: ___________________________________ Time: _______________ Notes: ___________________________________  Low blood glucose Low blood glucose (hypoglycemia) is when glucose is at or below 70 mg/dL (3.9 mmol/L). Symptoms may include: Feeling: Hungry. Sweaty and clammy. Irritable or easily upset. Dizzy. Sleepy. Having: A fast heartbeat. A headache. A change in your vision. Numbness around the mouth, lips, or  tongue. Having trouble with: Moving (coordination). Sleeping. Treating low blood glucose To treat low blood glucose, eat or drink something containing sugar right away. If you can think clearly and swallow safely, follow the 15:15 rule: Take 15 grams of a fast-acting carb (carbohydrate), as told by your health care provider. Some fast-acting carbs are: Glucose tablets: take 3-4 tablets. Hard candy: eat 3-5 pieces. Fruit juice: drink 4 oz (120 mL). Regular (not diet) soda: drink 4-6 oz (120-180 mL). Honey or sugar: eat 1 Tbsp (15 mL). Check your blood glucose levels 15 minutes after you take the carb. If your glucose is still at or below 70 mg/dL (3.9 mmol/L), take 15 grams of  a carb again. If your glucose does not go above 70 mg/dL (3.9 mmol/L) after 3 tries, get help right away. After your glucose goes back to normal, eat a meal or a snack within 1 hour. Treating very low blood glucose If your glucose is at or below 54 mg/dL (3 mmol/L), you have very low blood glucose (severe hypoglycemia). This is an emergency. Do not wait to see if the symptoms will go away. Get medical help right away. Call your local emergency services (911 in the [place] Do not drive yourself to the hospital. Questions to ask your health care provider Should I talk with a diabetes educator? What equipment will I need to care for myself at home? What diabetes medicines do I need? When should I take them? How often do I need to check my blood glucose levels? What number can I call if I have questions? When is my follow-up visit? Where can I find a support group for people with diabetes? Where to find more information American Diabetes Association: www.diabetes.org Association of Diabetes Care and Education Specialists: www.diabeteseducator.org Contact a health care provider if: Your blood glucose is at or above 240 mg/dL (13.3 mmol/L) for 2 days in a row. You have been sick or have had a fever for 2 days or more,  and you are not getting better. You have any of these problems for more than 6 hours: You cannot eat or drink. You feel nauseous. You vomit. You have diarrhea. Get help right away if: Your blood glucose is lower than 54 mg/dL (3 mmol/L). You get confused. You have trouble thinking clearly. You have trouble breathing. These symptoms may represent a serious problem that is an emergency. Do not wait to see if the symptoms will go away. Get medical help right away. Call your local emergency services (911 in the [place] Do not drive yourself to the hospital. Summary Diabetes mellitus is a chronic disease that occurs when the body does not properly use sugar (glucose) that is released from food after you eat. Take insulin and diabetes medicines as told. Check your blood glucose every day, as often as told. Keep all follow-up visits. This is important. This information is not intended to replace advice given to you by your health care provider. Make sure you discuss any questions you have with your health care provider. Document Revised: 09/16/2019 Document Reviewed: 09/16/2019 Elsevier Patient Education  [place].  Diabetes Mellitus and Nutrition, Adult When you have diabetes, or diabetes mellitus, it is very important to have healthy eating habits because your blood sugar (glucose) levels are greatly affected by what you eat and drink. Eating healthy foods in the right amounts, at about the same times every day, can help you: Manage your blood glucose. Lower your risk of heart disease. Improve your blood pressure. Reach or maintain a healthy weight. What can affect my meal plan? Every person with diabetes is different, and each person has different needs for a meal plan. Your health care provider may recommend that you work with a dietitian to make a meal plan that is best for you. Your meal plan may vary depending on factors such as: The calories you need. The medicines you  take. Your weight. Your blood glucose, blood pressure, and cholesterol levels. Your activity level. Other health conditions you have, such as heart or kidney disease. How do carbohydrates affect me? Carbohydrates, also called carbs, affect your blood glucose level more than any other type of food. Eating carbs  raises the amount of glucose in your blood. It is important to know how many carbs you can safely have in each meal. This is different for every person. Your dietitian can help you calculate how many carbs you should have at each meal and for each snack. How does alcohol affect me? Alcohol can cause a decrease in blood glucose (hypoglycemia), especially if you use insulin or take certain diabetes medicines by mouth. Hypoglycemia can be a life-threatening condition. Symptoms of hypoglycemia, such as sleepiness, dizziness, and confusion, are similar to symptoms of having too much alcohol. Do not drink alcohol if: Your health care provider tells you not to drink. You are pregnant, may be pregnant, or are planning to become pregnant. If you drink alcohol: Limit how much you have to: 0-1 drink a day for women. 0-2 drinks a day for men. Know how much alcohol is in your drink. In the [place] one drink equals one 12 oz bottle of beer (355 mL), one 5 oz glass of wine (148 mL), or one 1 oz glass of hard liquor (44 mL). Keep yourself hydrated with water, diet soda, or unsweetened iced tea. Keep in mind that regular soda, juice, and other mixers may contain a lot of sugar and must be counted as carbs. What are tips for following this plan?  Reading food labels Start by checking the serving size on the Nutrition Facts label of packaged foods and drinks. The number of calories and the amount of carbs, fats, and other nutrients listed on the label are based on one serving of the item. Many items contain more than one serving per package. Check the total grams (g) of carbs in one serving. Check the  number of grams of saturated fats and trans fats in one serving. Choose foods that have a low amount or none of these fats. Check the number of milligrams (mg) of salt (sodium) in one serving. Most people should limit total sodium intake to less than 2,300 mg per day. Always check the nutrition information of foods labeled as "low-fat" or "nonfat." These foods may be higher in added sugar or refined carbs and should be avoided. Talk to your dietitian to identify your daily goals for nutrients listed on the label. Shopping Avoid buying canned, pre-made, or processed foods. These foods tend to be high in fat, sodium, and added sugar. Shop around the outside edge of the grocery store. This is where you will most often find fresh fruits and vegetables, bulk grains, fresh meats, and fresh dairy products. Cooking Use low-heat cooking methods, such as baking, instead of high-heat cooking methods, such as deep frying. Cook using healthy oils, such as olive, canola, or sunflower oil. Avoid cooking with butter, cream, or high-fat meats. Meal planning Eat meals and snacks regularly, preferably at the same times every day. Avoid going long periods of time without eating. Eat foods that are high in fiber, such as fresh fruits, vegetables, beans, and whole grains. Eat 4-6 oz (112-168 g) of lean protein each day, such as lean meat, chicken, fish, eggs, or tofu. One ounce (oz) (28 g) of lean protein is equal to: 1 oz (28 g) of meat, chicken, or fish. 1 egg.  cup (62 g) of tofu. Eat some foods each day that contain healthy fats, such as avocado, nuts, seeds, and fish. What foods should I eat? Fruits Berries. Apples. Oranges. Peaches. Apricots. Plums. Grapes. Mangoes. Papayas. Pomegranates. Kiwi. Cherries. Vegetables Leafy greens, including lettuce, spinach, kale, chard, collard greens, mustard  greens, and cabbage. Beets. Cauliflower. Broccoli. Carrots. Green beans. Tomatoes. Peppers. Onions. Cucumbers.  Brussels sprouts. Grains Whole grains, such as whole-wheat or whole-grain bread, crackers, tortillas, cereal, and pasta. Unsweetened oatmeal. Quinoa. Brown or wild rice. Meats and other proteins Seafood. Poultry without skin. Lean cuts of poultry and beef. Tofu. Nuts. Seeds. Dairy Low-fat or fat-free dairy products such as milk, yogurt, and cheese. The items listed above may not be a complete list of foods and beverages you can eat and drink. Contact a dietitian for more information. What foods should I avoid? Fruits Fruits canned with syrup. Vegetables Canned vegetables. Frozen vegetables with butter or cream sauce. Grains Refined white flour and flour products such as bread, pasta, snack foods, and cereals. Avoid all processed foods. Meats and other proteins Fatty cuts of meat. Poultry with skin. Breaded or fried meats. Processed meat. Avoid saturated fats. Dairy Full-fat yogurt, cheese, or milk. Beverages Sweetened drinks, such as soda or iced tea. The items listed above may not be a complete list of foods and beverages you should avoid. Contact a dietitian for more information. Questions to ask a health care provider Do I need to meet with a certified diabetes care and education specialist? Do I need to meet with a dietitian? What number can I call if I have questions? When are the best times to check my blood glucose? Where to find more information: American Diabetes Association: diabetes.org Academy of Nutrition and Dietetics: eatright.[employer] of Diabetes and Digestive and Kidney Diseases: [employer] Association of Diabetes Care & Education Specialists: diabeteseducator.org Summary It is important to have healthy eating habits because your blood sugar (glucose) levels are greatly affected by what you eat and drink. It is important to use alcohol carefully. A healthy meal plan will help you manage your blood glucose and lower your risk of heart disease. Your  health care provider may recommend that you work with a dietitian to make a meal plan that is best for you. This information is not intended to replace advice given to you by your health care provider. Make sure you discuss any questions you have with your health care provider. Document Revised: 12/17/2019 Document Reviewed: 12/17/2019 Elsevier Patient Education  [place].

## 2021-11-23 NOTE — Assessment & Plan Note (Addendum)
New diagnosis. hgbA1c at 6.7% I explained diabetes type 2 disease process and possible complications. Advised about need for nutritionist referral and annual eye exam. He agreed with nutritionist referral. He has upcoming appt with [facility] ophthalmology. Collect urine microalbumin today eGFr >60 BP at goal LDL at goal with crestor 2m F/up in 363month

## 2021-11-24 LAB — MICROALBUMIN / CREATININE URINE RATIO
Creatinine,U: 146.8 mg/dL
Microalb Creat Ratio: 2 mg/g (ref 0.0–30.0)
Microalb, Ur: 2.9 mg/dL — ABNORMAL HIGH (ref 0.0–1.9)

## 2021-11-24 LAB — PROTIME-INR
INR: 2.5 ratio — ABNORMAL HIGH (ref 0.8–1.0)
Prothrombin Time: 26.2 s — ABNORMAL HIGH (ref 9.6–13.1)

## 2021-11-25 ENCOUNTER — Telehealth: Payer: Self-pay | Admitting: Nurse Practitioner

## 2021-11-25 ENCOUNTER — Ambulatory Visit (HOSPITAL_COMMUNITY): Payer: Medicare Other

## 2021-11-25 ENCOUNTER — Other Ambulatory Visit: Payer: Self-pay | Admitting: Family Medicine

## 2021-11-25 ENCOUNTER — Inpatient Hospital Stay: Payer: Medicare Other

## 2021-11-25 DIAGNOSIS — I2510 Atherosclerotic heart disease of native coronary artery without angina pectoris: Secondary | ICD-10-CM | POA: Diagnosis not present

## 2021-11-25 DIAGNOSIS — I509 Heart failure, unspecified: Secondary | ICD-10-CM

## 2021-11-25 DIAGNOSIS — J449 Chronic obstructive pulmonary disease, unspecified: Secondary | ICD-10-CM

## 2021-11-25 MED ORDER — WARFARIN SODIUM 5 MG PO TABS: ORAL_TABLET | ORAL | 5 refills | Status: DC

## 2021-11-25 NOTE — Telephone Encounter (Signed)
Pt is wanting a call back concerning his most recent lab results. Please advise pt [phone]

## 2021-11-25 NOTE — Telephone Encounter (Signed)
Pt advised of lab results. 1 month lab appt scheduled.

## 2021-11-30 ENCOUNTER — Inpatient Hospital Stay: Payer: Medicare Other | Attending: Internal Medicine

## 2021-11-30 ENCOUNTER — Ambulatory Visit (HOSPITAL_COMMUNITY)
Admission: RE | Admit: 2021-11-30 | Discharge: 2021-11-30 | Disposition: A | Discharge status: Home / Self Care | Payer: Medicare Other | Source: Ambulatory Visit | Attending: Internal Medicine | Admitting: Internal Medicine

## 2021-11-30 DIAGNOSIS — J439 Emphysema, unspecified: Secondary | ICD-10-CM | POA: Diagnosis not present

## 2021-11-30 DIAGNOSIS — C3490 Malignant neoplasm of unspecified part of unspecified bronchus or lung: Secondary | ICD-10-CM | POA: Diagnosis not present

## 2021-11-30 DIAGNOSIS — N281 Cyst of kidney, acquired: Secondary | ICD-10-CM | POA: Diagnosis not present

## 2021-11-30 DIAGNOSIS — J90 Pleural effusion, not elsewhere classified: Secondary | ICD-10-CM | POA: Diagnosis not present

## 2021-11-30 LAB — GLUCOSE, CAPILLARY: Glucose-Capillary: 99 mg/dL (ref 70–99)

## 2021-11-30 MED ORDER — FLUDEOXYGLUCOSE F - 18 (FDG) INJECTION
6.6000 | Freq: Once | INTRAVENOUS | Status: AC
  Administered 2021-11-30: 6.6 via INTRAVENOUS

## 2021-12-01 ENCOUNTER — Inpatient Hospital Stay: Payer: Medicare Other

## 2021-12-01 ENCOUNTER — Inpatient Hospital Stay (HOSPITAL_BASED_OUTPATIENT_CLINIC_OR_DEPARTMENT_OTHER): Payer: Medicare Other | Admitting: Internal Medicine

## 2021-12-01 DIAGNOSIS — C3491 Malignant neoplasm of unspecified part of right bronchus or lung: Secondary | ICD-10-CM

## 2021-12-02 NOTE — Progress Notes (Signed)
This encounter was created in error - please disregard.

## 2021-12-06 ENCOUNTER — Other Ambulatory Visit: Payer: Self-pay

## 2021-12-06 ENCOUNTER — Inpatient Hospital Stay (HOSPITAL_BASED_OUTPATIENT_CLINIC_OR_DEPARTMENT_OTHER): Payer: Medicare Other | Admitting: Internal Medicine

## 2021-12-06 VITALS — BP 103/69 | HR 65 | Temp 97.8°F | Resp 18 | Wt 135.3 lb

## 2021-12-06 DIAGNOSIS — Z85118 Personal history of other malignant neoplasm of bronchus and lung: Secondary | ICD-10-CM | POA: Insufficient documentation

## 2021-12-06 DIAGNOSIS — C3491 Malignant neoplasm of unspecified part of right bronchus or lung: Secondary | ICD-10-CM | POA: Diagnosis not present

## 2021-12-06 NOTE — Progress Notes (Signed)
[place] [phone]   [phone]  OFFICE PROGRESS NOTE  [name], [name], NP [place] [address]  DIAGNOSIS: Stage IIIA non-small cell lung cancer, adenocarcinoma diagnosed in 2017  PRIOR THERAPY: status post a course of concurrent chemoradiation with weekly carboplatin and paclitaxel completed in early 2018 at [facility]. The patient has been in observation since that time.  CURRENT THERAPY: Observation.  INTERVAL HISTORY: Jared Stevens 71 y.o. male returns to the clinic today for follow-up visit.  The patient is feeling fine today with no concerning complaints except for mild fatigue.  He denied having any chest pain, shortness of breath, cough or hemoptysis.  He denied having any fever or chills.  He has no nausea, vomiting, diarrhea or constipation.  He has no headache or visual changes.  He has no recent weight loss or night sweats.  He was found on previous CT scan of the chest to have suspicious disease recurrence and the right lung.  The patient had a PET scan performed recently and he is here for evaluation and discussion of his scan results.  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  MEDICATIONS:  Current Outpatient Medications  Medication Sig Dispense Refill   albuterol (VENTOLIN HFA) 108 (90 Base) MCG/ACT inhaler Inhale 1-2 puffs into the lungs every 6 (six) hours as needed for wheezing or shortness of breath. Patient states he takes only as needed. 8 g 0   amitriptyline (ELAVIL) 50 MG tablet Take 50 mg by mouth at bedtime as needed.      carvedilol (COREG) 3.125 MG tablet TAKE 1 TABLET (3.125 MG TOTAL) BY MOUTH 2 (TWO) TIMES DAILY WITH A MEAL. 180 tablet 3   Cyanocobalamin (B-12) 2500 MCG SUBL Place under the tongue.     digoxin (LANOXIN) 0.125 MG tablet TAKE 1 TABLET (0.125 MG TOTAL) BY MOUTH DAILY. 90 tablet 3   finasteride (PROSCAR) 5 MG tablet Take 5 mg by mouth daily.     furosemide (LASIX) 40 MG tablet Take 1 tablet (40 mg total) by mouth daily. 90 tablet 3   latanoprost (XALATAN) 0.005 % ophthalmic solution Place 1 drop into both eyes daily.  11   rosuvastatin (CRESTOR) 40 MG tablet TAKE ONE TABLET BY MOUTH EVERY DAY 90 tablet 3   spironolactone (ALDACTONE) 25 MG tablet TAKE 0.5 TABLETS (12.5 MG TOTAL) BY MOUTH EVERY OTHER DAY. 45 tablet 1  SYMBICORT 160-4.5 MCG/ACT inhaler INHALE 2 PUFFS BY MOUTH TWICE DAILY IN THE MORNING AND EVENING 1 each 0   tamsulosin (FLOMAX) 0.4 MG CAPS capsule Take 0.4 mg by mouth at bedtime.     warfarin (COUMADIN) 5 MG tablet Monday-Thursday take 1tab daily and Friday-Sunday take 1.5tab daily 34 tablet 5   No current facility-administered medications for this visit.    SURGICAL HISTORY:  Past Surgical History:  Procedure Laterality Date   Arm Surgery     HERNIA REPAIR     IR ANGIO INTRA EXTRACRAN SEL COM CAROTID INNOMINATE UNI L MOD SED  10/21/2018   IR ANGIO VERTEBRAL SEL SUBCLAVIAN INNOMINATE UNI R MOD SED  10/21/2018   IR CT HEAD LTD   10/21/2018   IR PERCUTANEOUS ART THROMBECTOMY/INFUSION INTRACRANIAL INC DIAG ANGIO  10/21/2018   IR THORACENTESIS ASP PLEURAL SPACE W/IMG GUIDE  09/27/2018   RADIOLOGY WITH ANESTHESIA N/A 10/21/2018   Procedure: RADIOLOGY WITH ANESTHESIA;  Surgeon: Luanne Bras, MD;  Location: [facility];  Service: Radiology;  Laterality: N/A;   REPLANTATION THUMB     RIGHT/LEFT HEART CATH AND CORONARY ANGIOGRAPHY N/A 10/01/2018   Procedure: RIGHT/LEFT HEART CATH AND CORONARY ANGIOGRAPHY;  Surgeon: Troy Sine, MD;  Location: [facility];  Service: Cardiovascular;  Laterality: N/A;    REVIEW OF SYSTEMS:  Constitutional: positive for fatigue Eyes: negative Ears, nose, mouth, throat, and face: negative Respiratory: negative Cardiovascular: negative Gastrointestinal: negative Genitourinary:negative Integument/breast: negative Hematologic/lymphatic: negative Musculoskeletal:negative Neurological: negative Behavioral/Psych: negative Endocrine: negative Allergic/Immunologic: negative   PHYSICAL EXAMINATION: General appearance: alert, cooperative, fatigued, and no distress Head: Normocephalic, without obvious abnormality, atraumatic Neck: no adenopathy, no JVD, supple, symmetrical, trachea midline, and thyroid not enlarged, symmetric, no tenderness/mass/nodules Lymph nodes: Cervical, supraclavicular, and axillary nodes normal. Resp: clear to auscultation bilaterally Back: symmetric, no curvature. ROM normal. No CVA tenderness. Cardio: regular rate and rhythm, S1, S2 normal, no murmur, click, rub or gallop GI: soft, non-tender; bowel sounds normal; no masses,  no organomegaly Extremities: extremities normal, atraumatic, no cyanosis or edema Neurologic: Alert and oriented X 3, normal strength and tone. Normal symmetric reflexes. Normal coordination and gait  ECOG PERFORMANCE STATUS: 1 - Symptomatic but completely ambulatory  Blood pressure 103/69, pulse 65, temperature 97.8 F (36.6 C),  temperature source Tympanic, resp. rate 18, weight 135 lb 4.8 oz (61.4 kg), SpO2 96 %.  LABORATORY DATA: Lab Results  Component Value Date   WBC 6.7 11/07/2021   HGB 14.1 11/07/2021   HCT 41.8 11/07/2021   MCV 84.8 11/07/2021   PLT 323 11/07/2021      Chemistry      Component Value Date/Time   NA 142 11/07/2021 1104   NA 138 06/01/2021 1239   K 3.7 11/07/2021 1104   CL 104 11/07/2021 1104   CO2 33 (H) 11/07/2021 1104   BUN 19 11/07/2021 1104   BUN 17 06/01/2021 1239   CREATININE 1.22 11/07/2021 1104      Component Value Date/Time   CALCIUM 9.7 11/07/2021 1104   ALKPHOS 78 11/07/2021 1104   AST 16 11/07/2021 1104   ALT 11 11/07/2021 1104   BILITOT 0.4 11/07/2021 1104       RADIOGRAPHIC STUDIES NM PET Image Restage (PS) Skull Base to Thigh (F-18 FDG)  Result Date: 11/30/2021 CLINICAL DATA:  Subsequent treatment strategy for non-small-cell lung cancer. Adenocarcinoma. EXAM: NUCLEAR MEDICINE PET SKULL BASE TO THIGH TECHNIQUE: 6.6 mCi F-18 FDG was injected intravenously. Full-ring PET imaging was performed from the skull base  to thigh after the radiotracer. CT data was obtained and used for attenuation correction and anatomic localization. Fasting blood glucose: 99 mg/dl COMPARISON:  Chest CT 11/07/2021 FINDINGS: Mediastinal blood pool activity: SUV max 2.4 Liver activity: SUV max NA NECK: No hypermetabolic lymph nodes in the neck. Incidental CT findings: none CHEST: 1.9 x 3.4 cm focus of soft tissue in the medial right upper lobe adjacent to the ascending aorta identified on the recent chest CT is markedly hypermetabolic with SUV max = 9.8. There is also focal hypermetabolism in the posterior right hilum with SUV max = 7.3. Small focus of hypermetabolism identified in the subcarinal space with SUV max = 3.2. The dense consolidative scarring in the infrahilar aspect of the medial right lung (image 96/4) shows no hypermetabolic uptake on PET imaging today. No suspicious hypermetabolic  pulmonary nodule or mass in the left lung. Incidental CT findings: Tiny right pleural effusion. ABDOMEN/PELVIS: No abnormal hypermetabolic activity within the liver, pancreas, adrenal glands, or spleen. No hypermetabolic lymph nodes in the abdomen or pelvis. Incidental CT findings: There is moderate atherosclerotic calcification of the abdominal aorta without aneurysm. Small cyst noted interpolar right kidney. Prominent stool volume evident. SKELETON: No focal hypermetabolic activity to suggest skeletal metastasis. Low level uptake in the left sternoclavicular joint is probably degenerative. Incidental CT findings: No worrisome lytic or sclerotic osseous abnormality. IMPRESSION: 1. The progressive soft tissue density in the medial right upper lobe adjacent to the ascending aorta and SVC on recent diagnostic chest CT is markedly hypermetabolic consistent with recurrent disease. 2. Focal hypermetabolism identified posterior right hilar region, also suspicious for recurrence. Low level FDG accumulation in the subcarinal station may reflect hypermetabolic nodal metastasis. 3. No evidence for hypermetabolic metastases in the neck, abdomen, or pelvis. 4. Tiny right pleural effusion. 5.  Emphysema. (ICD10-J43.9) 6.  Aortic Atherosclerois (ICD10-170.0) Electronically Signed   By: Misty Stanley M.D.   On: 11/30/2021 15:09   CT Chest W Contrast  Result Date: 11/08/2021 CLINICAL DATA:  Primary Cancer Type: Lung Imaging Indication: Routine surveillance Interval therapy since last imaging? No Initial Cancer Diagnosis Date: 2017 Detailed Pathology: Stage IIIA non-small cell lung cancer, adenocarcinoma. Primary Tumor location:  Right lung. Surgeries: No thoracic. Chemotherapy: Yes; Ongoing? No; Most recent administration: 2018 Immunotherapy? No Radiation therapy?  Yes; Date Range: 2017; Target: Right lung * Tracking Code: BO * EXAM: CT CHEST WITH CONTRAST TECHNIQUE: Multidetector CT imaging of the chest was performed during  intravenous contrast administration. RADIATION DOSE REDUCTION: This exam was performed according to the departmental dose-optimization program which includes automated exposure control, adjustment of the mA and/or kV according to patient size and/or use of iterative reconstruction technique. CONTRAST:  104mL OMNIPAQUE IOHEXOL 300 MG/ML  SOLN COMPARISON:  Most recent CT chest 10/19/2020. 05/23/2018 PET-CT. FINDINGS: Cardiovascular: The heart is normal in size. No pericardial effusion. Stable mild tortuosity and calcification of the thoracic aorta but no aneurysm or dissection. Stable coronary artery calcifications. Mediastinum/Nodes: No mediastinal or hilar lymphadenopathy. The esophagus is grossly. Lungs/Pleura: Stable post treatment changes involving the right hemithorax with loss of volume and dense radiation fibrosis involving the right hilum, infrahilar region and right paramediastinal lung. No findings suspicious for recurrent tumor in this area. However, there is increasing soft tissue density in the right upper lobe medially adjacent to the aortic arch and narrowed SVC. This has slowly enlarged over the past CT scans and is concerning for possible neoplastic process. It measures approximately 3.8 x 3.4 x 1.9 cm. In 2021 this  measured 2.3 x 2.2 x 1.0 cm. Recommend either continued close follow-up (repeat CT scan and 3-4 months) versus PET-CT. No pulmonary nodules to suggest pulmonary metastatic disease. No acute pulmonary process. There is a stable chronic right pleural effusion. Stable underlying emphysematous changes. Upper Abdomen: No significant upper abdominal findings. Stable vascular calcifications. Musculoskeletal: No chest wall mass, supraclavicular or axillary adenopathy. The bony thorax is intact. IMPRESSION: 1. Stable post treatment changes involving the right hemithorax. No findings suspicious for recurrent tumor in this area. 2. Progressive soft tissue density in the right upper lobe medially  adjacent to the aortic arch and SVC. This is worrisome for possible neoplastic process. Recommend either continued close follow-up (repeat CT scan in 3-4 months) versus PET-CT. 3. No pulmonary nodules to suggest pulmonary metastatic disease. 4. Stable emphysematous changes. 5. Stable chronic right pleural effusion. 6. Stable atherosclerotic calcifications involving the aorta and branch vessels including the coronary arteries. Aortic Atherosclerosis (ICD10-I70.0) and Emphysema (ICD10-J43.9). Electronically Signed   By: Marijo Sanes M.D.   On: 11/08/2021 10:32     ASSESSMENT AND PLAN: This is a very pleasant 71 years old African-American male with history of a stage IIIa non-small cell lung cancer, adenocarcinoma status post a course of concurrent chemoradiation in [place] in 2017. The patient has been on observation since his treatment in 2017. He had repeat CT scan of the chest performed recently. His scan showed stable appearance of the treatment changes involving the right hemothorax but there was progressive soft tissue density in the right upper lobe medially adjacent to the aortic arch and SVC worrisome for possible neoplastic process. The patient had a PET scan performed recently and that showed the progressive soft tissue density in the medial right upper lobe adjacent to the ascending aorta and SVC on recent CT scan is markedly hypermetabolic consistent with disease recurrence.  There was low-level FDG accumulation in the subcarinal station may reflect hypermetabolic nodal metastasis but no evidence of hypermetabolic metastatic disease in the neck, abdomen or pelvis. I personally and independently reviewed the scan images and discussed the result and showed the images to the patient today. I recommended for him to see Dr. Valeta Harms for consideration of repeat bronchoscopy and biopsy of the suspicious disease recurrence. I will see the patient back for follow-up visit in 3 weeks for evaluation and  discussion of his treatment options based on the final pathology and molecular studies. The patient was advised to call immediately if he has any other concerning symptoms in the interval. The patient voices understanding of current disease status and treatment options and is in agreement with the current care plan. All questions were answered. The patient knows to call the clinic with any problems, questions or concerns. We can certainly see the patient much sooner if necessary.  Disclaimer: This note was dictated with voice recognition software. Similar sounding words can inadvertently be transcribed and may not be corrected upon review.

## 2021-12-13 ENCOUNTER — Ambulatory Visit (INDEPENDENT_AMBULATORY_CARE_PROVIDER_SITE_OTHER): Payer: Medicare Other

## 2021-12-13 DIAGNOSIS — E1165 Type 2 diabetes mellitus with hyperglycemia: Secondary | ICD-10-CM

## 2021-12-13 DIAGNOSIS — Z9181 History of falling: Secondary | ICD-10-CM

## 2021-12-13 DIAGNOSIS — I5042 Chronic combined systolic (congestive) and diastolic (congestive) heart failure: Secondary | ICD-10-CM

## 2021-12-13 DIAGNOSIS — I2510 Atherosclerotic heart disease of native coronary artery without angina pectoris: Secondary | ICD-10-CM

## 2021-12-13 NOTE — Chronic Care Management (AMB) (Signed)
Chronic Care Management   CCM RN Visit Note  12/13/2021 Name: Jared Stevens MRN: [id number] DOB: [birth year]  Subjective: Jared Stevens is a 71 y.o. year old male who is a primary care patient of [name], [name], NP. The care management team was consulted for assistance with disease management and care coordination needs.    Engaged with patient by telephone for follow up visit in response to provider referral for case management and/or care coordination services.   Consent to Services:  The patient was given information about Chronic Care Management services, agreed to services, and gave verbal consent prior to initiation of services.  Please see initial visit note for detailed documentation.   Patient agreed to services and verbal consent obtained.   Assessment: Review of patient past medical history, allergies, medications, health status, including review of consultants reports, laboratory and other test data, was performed as part of comprehensive evaluation and provision of chronic care management services.   SDOH (Social Determinants of Health) assessments and interventions performed:    CCM Care Plan  No Known Allergies  Outpatient Encounter Medications as of 12/13/2021  Medication Sig Note   albuterol (VENTOLIN HFA) 108 (90 Base) MCG/ACT inhaler Inhale 1-2 puffs into the lungs every 6 (six) hours as needed for wheezing or shortness of breath. Patient states he takes only as needed.    amitriptyline (ELAVIL) 50 MG tablet Take 50 mg by mouth at bedtime as needed.     carvedilol (COREG) 3.125 MG tablet TAKE 1 TABLET (3.125 MG TOTAL) BY MOUTH 2 (TWO) TIMES DAILY WITH A MEAL.    Cyanocobalamin (B-12) 2500 MCG SUBL Place under the tongue.    digoxin (LANOXIN) 0.125 MG tablet TAKE 1 TABLET (0.125 MG TOTAL) BY MOUTH DAILY.    finasteride (PROSCAR) 5 MG tablet Take 5 mg by mouth daily.    furosemide (LASIX) 40 MG tablet Take 1 tablet (40 mg total) by mouth daily. 09/14/2021: Patient  states he is still taking   latanoprost (XALATAN) 0.005 % ophthalmic solution Place 1 drop into both eyes daily.    rosuvastatin (CRESTOR) 40 MG tablet TAKE ONE TABLET BY MOUTH EVERY DAY    spironolactone (ALDACTONE) 25 MG tablet TAKE 0.5 TABLETS (12.5 MG TOTAL) BY MOUTH EVERY OTHER DAY.    SYMBICORT 160-4.5 MCG/ACT inhaler INHALE 2 PUFFS BY MOUTH TWICE DAILY IN THE MORNING AND EVENING    tamsulosin (FLOMAX) 0.4 MG CAPS capsule Take 0.4 mg by mouth at bedtime.    warfarin (COUMADIN) 5 MG tablet Monday-Thursday take 1tab daily and Friday-Sunday take 1.5tab daily    No facility-administered encounter medications on file as of 12/13/2021.    Patient Active Problem List   Diagnosis Date Noted   Type 2 diabetes mellitus with hyperglycemia, without long-term current use of insulin ([place]) 11/23/2021   Peripheral neuropathy due to chemotherapy ([facility] [place]) 07/22/2021   CAD (coronary artery disease) 05/11/2021   Benign prostatic hyperplasia without lower urinary tract symptoms 01/19/2021   Inguinal hernia of left side without obstruction or gangrene 01/19/2021   Acute urinary retention 07/28/2020   Long term current use of anticoagulant therapy 01/01/2020   Chronic combined systolic and diastolic CHF (congestive heart failure) ([place]) 10/31/2019   Neurocognitive deficits 11/06/2018   Encephalopathy 10/30/2018   History of CVA with residual deficit 10/21/2018   Middle cerebral artery embolism, 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/2018   Recurrent right pleural effusion    History of lung cancer 09/27/2018   Elevated brain natriuretic peptide (BNP) level 09/27/2018   COPD (  chronic obstructive pulmonary disease) ([place]) 09/27/2018   Hyperlipidemia 09/27/2018   Tobacco use disorder, continuous 09/27/2018   Non-small cell lung cancer, right ([place]) 08/26/2018    Conditions to be addressed/monitored:CAD, HF, Falls, Diabetes type 2  Care Plan : [facility] plan of care  Updates made by Dannielle Karvonen, RN since 12/13/2021 12:00 AM      Problem: Chronic disease management education and care coordination needs.   Priority: High     Long-Range Goal: Development of plan of care to address chronic  disease management and care coordination needs.   Start Date: 07/27/2021  Expected End Date: 01/26/2022  Priority: High  Note:   Current Barriers:  Knowledge Deficits related to plan of care for management of CHF, CAD, and falls  Patient states he is doing well.  Reports having follow up with primary care provider on 11/23/21.  Patient reports he has been newly diagnosed with Diabetes type 2.  Hgb A1c noted at 6.7.  Denies being prescribed any medication. Patient states he is scheduled to see the diabetic educator on 12/27/21 and will follow up with primary care provider in 3 months as recommended.  Patient reports seeing the oncologist on 12/06/21 to discuss CT/ PET scan results.   Per chart possible disease reoccurence noted.  Per oncology note on 12/06/21: Recommended follow up with Dr. Valeta Harms for possible repeat bronchoscopy and biopsy.   Patient denies any heart failure symptoms at this time and denies having any falls since last outreach with RNCM.  RNCM Clinical Goal(s):  Patient will verbalize understanding of plan for management of CHF, HTN, and falls as evidenced by patient self report and / or notation in chart take all medications exactly as prescribed and will call provider for medication related questions as evidenced by patient self report and/ or notation in chart    attend all scheduled medical appointments:   as evidenced by patient self report and/ or notation in chart        continue to work with RN Care Manager and/or Social Worker to address care management and care coordination needs related to CHF, CAD, and falls as evidenced by adherence to CM Team Scheduled appointments     through collaboration with Consulting civil engineer, provider, and care team.   Interventions: 1:1 collaboration with primary care provider regarding  development and update of comprehensive plan of care as evidenced by provider attestation and co-signature Inter-disciplinary care team collaboration (see longitudinal plan of care) Evaluation of current treatment plan related to  self management and patient's adherence to plan as established by provider   CAD Interventions: (Status:  Goal Met.)  Reviewed medications and discussed compliance Reviewed Importance of attending all scheduled provider appointments Monitor blood pressure at least 2-3 times per week Follow low salt/ heart healthy diet   Heart Failure Interventions:  (Status:  Goal Met.)  Reviewed medications with patient and discussed importance of compliance Discussed plans with patient for ongoing care management follow up and provided patient with direct contact information for care management team Reviewed scheduled/upcoming provider appointments  Discussed heart failure symptoms.  Reminded patient  to weigh daily and notify provider for 3 lbs weight gain overnight/ 5 lbs weight gain in a week.  Advised patient to follow low salt diet.   Falls Interventions:  (Status:  Goal Met.)  Reviewed medications and discussed potential side effects of medications such as dizziness and frequent urination Assessed for falls since last outreach with RNCM.  Reviewed scheduled/upcoming provider appointments  Discussed plans with patient for ongoing care management follow up and provided patient with direct contact information for care management team   Diabetes Interventions:  (Status:  Resolving due to duplicate goal ) Assessed patient's understanding of A1c goal: <7% Provided education to patient about basic DM disease process Reviewed medications with patient and discussed importance of medication adherence Discussed plans with patient for ongoing care management follow up and provided patient with direct contact information for care management team Reviewed scheduled/upcoming  provider appointments  Lab Results  Component Value Date   HGBA1C 6.7 (A) 11/23/2021      Patient Goals/Self-Care Activities:  Continue to take medications as prescribed   Call pharmacy for medication refills 3-7 days in advance of running out of medications Call provider office for new concerns or questions  - Follow up with your providers as recommended Review diabetic education material sent to you in [place]. Keep follow up appointment with diabetic educator      Plan:The care management team will reach out to the patient again over the next 45 days. [name] RN,BSN,CCM RN Care Manager Coordinator [place] [phone]

## 2021-12-13 NOTE — Patient Instructions (Signed)
Visit Information  Thank you for taking time to visit with me today. Please don't hesitate to contact me if I can be of assistance to you before our next scheduled telephone appointment.  Following are the goals we discussed today:   Continue to take medications as prescribed   Call pharmacy for medication refills 3-7 days in advance of running out of medications Call provider office for new concerns or questions  - Follow up with your providers as recommended Review diabetic education material sent to you in MyChart. Keep follow up appointment with diabetic educator  Our next appointment is by telephone on 01/17/22 at 10:30 am  Please call the care guide team at [phone] if you need to cancel or reschedule your appointment.   If you are experiencing a Mental Health or [place] or need someone to talk to, please call the Suicide and Crisis Lifeline: 988 call 1-800-273-TALK (toll free, 24 hour hotline)   Patient verbalizes understanding of instructions and care plan provided today and agrees to view in [place]. Active MyChart status and patient understanding of how to access instructions and care plan via MyChart confirmed with patient.     [name] RN,BSN,CCM RN Care Manager Coordinator [place] [phone]

## 2021-12-14 ENCOUNTER — Telehealth: Payer: Medicare Other

## 2021-12-20 ENCOUNTER — Other Ambulatory Visit: Payer: Self-pay | Admitting: Family Medicine

## 2021-12-20 DIAGNOSIS — J449 Chronic obstructive pulmonary disease, unspecified: Secondary | ICD-10-CM

## 2021-12-21 ENCOUNTER — Encounter: Payer: Self-pay | Admitting: Pulmonary Disease

## 2021-12-21 ENCOUNTER — Ambulatory Visit (INDEPENDENT_AMBULATORY_CARE_PROVIDER_SITE_OTHER): Payer: Medicare Other | Admitting: Pulmonary Disease

## 2021-12-21 VITALS — BP 130/80 | HR 91 | Ht 73.0 in | Wt 135.0 lb

## 2021-12-21 DIAGNOSIS — R918 Other nonspecific abnormal finding of lung field: Secondary | ICD-10-CM

## 2021-12-21 NOTE — Patient Instructions (Signed)
Thank you for visiting Dr. Valeta Harms at [facility]. Today we recommend the following:  Orders Placed This Encounter  Procedures   Procedural/ Surgical Case Request: ROBOTIC ASSISTED NAVIGATIONAL BRONCHOSCOPY   Ambulatory referral to Pulmonology   Bronchoscopy on 12/27/2021 Stop Coumadin 12/22/2021  Return if symptoms worsen or fail to improve, for follow up scheduled with Dr. Julien Nordmann .    Please do your part to reduce the spread of COVID-19.

## 2021-12-21 NOTE — Progress Notes (Signed)
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 Earlie Server on 12/06/2021.  Patient initially diagnosed with stage IIIa non-small cell lung cancer in 2017.  He had concurrent chemoradiation with carboplatin and paclitaxel completed in early 2018 in [place].  Since then has been on observation.  Patient had repeat imaging and PET scan which showed low-level FDG accumulation in the subcarinal node station as well as a hypermetabolic disease within the progressive soft tissue density of the medial right upper lobe.  He was referred to me from Dr. Earlie Server for consideration of repeat bronchoscopy and biopsy.  Most recent echocardiogram on 07/06/2021 reveals a improved ejection fraction with a EF of 40 to 45%.  He is on Coumadin for anticoagulation with his history of stroke.    BP 130/80   Pulse 91   Ht 6\' 1"  (1.854 m)   Wt 135 lb (61.2 kg)   SpO2 96%   BMI 17.81 kg/m   General: Elderly gentleman, frail, chronically ill-appearing HEENT: Poor dentition, NCAT, tracking appropriately Heart: Regular rhythm S1-S2 Lungs: Diminished breath sounds bilaterally no crackles no wheeze Abdomen: Soft nontender  Labs: Reviewed  CT scan and PET scan: Progressive soft tissue density in the medial right upper lobe adjacent to the aorta and SVC concerning for recurrent disease is hypermetabolic.  There is also low-level hypermetabolic uptake within the subcarinal station. The patient's images have been independently reviewed by me.    Assessment: History of non-small cell lung cancer Imaging concerning for recurrence of disease.  Plan: Today in the office we discussed risk benefits alternatives proceeding with bronchoscopy. Patient is agreeable to proceed, risk of pneumothorax and bleeding was discussed. We will  plan for robotic assisted navigational bronchoscopy as well as videobronchoscope endobronchial ultrasound transbronchial needle aspirations of the mediastinal nodes. Tentative bronchoscopy date on 12/27/2021 to be  Patient is on Coumadin for atrial fibrillation he will need to hold this prior to procedure. Coumadin to be held on 12/22/2021. Repeat INR prior to procedure.  I agree with documentation from Farrel Gordon, DO PGY 2.  I spent 62 minutes dedicated to the care of this patient on the date of this encounter to include pre-visit review of records, face-to-face time with the patient discussing conditions above, post visit ordering of testing, clinical documentation with the electronic health record, making appropriate referrals as documented, and communicating necessary findings to members of the patients care team.    Garner Nash, [facility] 12/21/2021 10:20 AM

## 2021-12-21 NOTE — Progress Notes (Signed)
Synopsis: Referred in July 2023 for evaluation of possible recurrent lung cancer by [name], [name], NP  Subjective:   PATIENT ID: Jared Stevens GENDER: male DOB: 13-Jul-1950, MRN: [id number]  Chief Complaint  Patient presents with   Consult    Pt consults after CT and PET, pt states he has no difficulty breathing att.    Jared Stevens is a 71 y.o. M with history of Stage IIIA non-small cell lung cancer (adenocarcinoma) diagnosed in 2017 presenting today to discuss biopsy for recent abnormal imaging findings. He completed chemotherapy in 2018 and has since been on observation status. Recent CT chest in June 2023 was concerning for increasing soft tissue density in the R upper lobe medially adjacent to the aortic arch and narrowed SVC which has slowly enlarged over serial scans; it is 3.8 x 3.4 x 1.9 cm (previously 2.3 x 2.2 x 1.0 cm in 2021). Follow-up PET scan showed that the progressive soft tissue density on CT chest was hypermetabolic and concerning for recurrent disease, and noted a focal hypermetabolic focal lesion in the posterior R hilar region again concerning for recurrent disease.  He is currently asymptomatic and was at the time of his initial diagnosis as well. He explains that his first diagnosis was made after abnormalities were noted on blood work, prompting a chest x-ray which showed a lung mass. He underwent chemotherapy November 2017-April 2018 and has not had trouble since. He has not had recent weight loss but does struggle with his weight and has since starting Warfarin in 2020 due to CVA. He has been under general anesthesia previously and denies any complications at that time. He quit smoking around 1 month ago and smoked ~1/2 PPD for 54 years.  Past Medical History:  Diagnosis Date   Acute systolic CHF (congestive heart failure) ([place])    2D ECHO 09/27/2018 revealed left ventricular ejection fraction of 15%   CHF exacerbation ([place]) 09/27/2018   Chronic obstructive  pulmonary disease    Combined systolic and diastolic CHF    Mixed Ischemic/Non-Ischemic CM // Echo 09/2018: EF 15 // cMRI 10/2018: EF 11, inf scar // Echo 12/2018: EF 20-25, Gr 3 DD // Not a candidate for ICD due to comorbid illnesses    Coronary artery disease    cath 09/2018: RCA chronically occluded >> Med Rx   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   Tx with tPA and thrombectomy // Coumadin (managed by PCP)   Hx of recurrent R pleural effusion    s/p thoracentesis   Hypercholesteremia    Long term (current) use of anticoagulants 01/08/2020   Lung cancer ([place]) 03/2016   Mixed Ischemic and Non-Ischemic Cardiomyopathy    Non-small cell carcinoma of lung    Nonsustained ventricular tachycardia      Family History  Problem Relation Age of Onset   Aneurysm Mother    Colon polyps Neg Hx    Colon cancer Neg Hx      Past Surgical History:  Procedure Laterality Date   Arm Surgery     HERNIA REPAIR     IR ANGIO INTRA EXTRACRAN SEL COM CAROTID INNOMINATE UNI L MOD SED  10/21/2018   IR ANGIO VERTEBRAL SEL SUBCLAVIAN INNOMINATE UNI R MOD SED  10/21/2018   IR CT HEAD LTD  10/21/2018   IR PERCUTANEOUS ART THROMBECTOMY/INFUSION INTRACRANIAL INC DIAG ANGIO  10/21/2018   IR THORACENTESIS ASP PLEURAL SPACE W/IMG GUIDE  09/27/2018   RADIOLOGY WITH ANESTHESIA N/A 10/21/2018   Procedure: RADIOLOGY WITH  ANESTHESIA;  Surgeon: Luanne Bras, MD;  Location: [facility];  Service: Radiology;  Laterality: N/A;   REPLANTATION THUMB     RIGHT/LEFT HEART CATH AND CORONARY ANGIOGRAPHY N/A 10/01/2018   Procedure: RIGHT/LEFT HEART CATH AND CORONARY ANGIOGRAPHY;  Surgeon: Troy Sine, MD;  Location: [facility];  Service: Cardiovascular;  Laterality: N/A;    Social History   Socioeconomic History   Marital status: Widowed    Spouse name: Not on file   Number of children: Not on file   Years of education: Not on file   Highest education level: Not on file  Occupational History   Not on file  Tobacco Use   Smoking  status: Former    Packs/day: 0.75    Years: 55.00    Total pack years: 41.25    Types: Cigarettes    Start date: 04/09/1967    Quit date: 09/26/2021    Years since quitting: 0.2   Smokeless tobacco: Never  Vaping Use   Vaping Use: Never used  Substance and Sexual Activity   Alcohol use: Not Currently    Alcohol/week: 2.0 standard drinks of alcohol    Types: 2 Cans of beer per week    Comment: 1-2 beers weekly   Drug use: Not Currently   Sexual activity: Not Currently  Other Topics Concern   Not on file  Social History Narrative   Not on file   Social Determinants of Health   Financial Resource Strain: Low Risk  (06/14/2021)   Overall Financial Resource Strain (CARDIA)    Difficulty of Paying Living Expenses: Not hard at all  Food Insecurity: No Food Insecurity (02/16/2021)   Hunger Vital Sign    Worried About Running Out of Food in the Last Year: Never true    Ran Out of Food in the Last Year: Never true  Transportation Needs: Unmet Transportation Needs (11/28/2021)   PRAPARE - Transportation    Lack of Transportation (Medical): No    Lack of Transportation (Non-Medical): Yes  Physical Activity: Sufficiently Active (06/14/2021)   Exercise Vital Sign    Days of Exercise per Week: 7 days    Minutes of Exercise per Session: 30 min  Stress: No Stress Concern Present (06/14/2021)   [employer] [place] of Stress : Not at all  Social Connections: Socially Isolated (06/14/2021)   Social Connection and Isolation Panel [NHANES]    Frequency of Communication with Friends and Family: Twice a week    Frequency of Social Gatherings with Friends and Family: Twice a week    Attends Religious Services: Never    Marine scientist or Organizations: No    Attends Archivist Meetings: Never    Marital Status: Widowed  Intimate Partner Violence: Not At Risk (06/14/2021)   Humiliation, Afraid, Rape, and Kick  questionnaire    Fear of Current or Ex-Partner: No    Emotionally Abused: No    Physically Abused: No    Sexually Abused: No     No Known Allergies   Outpatient Medications Prior to Visit  Medication Sig Dispense Refill   albuterol (VENTOLIN HFA) 108 (90 Base) MCG/ACT inhaler Inhale 1-2 puffs into the lungs every 6 (six) hours as needed for wheezing or shortness of breath. Patient states he takes only as needed. 8 g 0   amitriptyline (ELAVIL) 50 MG tablet Take 50 mg by mouth at bedtime as needed.  carvedilol (COREG) 3.125 MG tablet TAKE 1 TABLET (3.125 MG TOTAL) BY MOUTH 2 (TWO) TIMES DAILY WITH A MEAL. 180 tablet 3   Cyanocobalamin (B-12) 2500 MCG SUBL Place under the tongue.     digoxin (LANOXIN) 0.125 MG tablet TAKE 1 TABLET (0.125 MG TOTAL) BY MOUTH DAILY. 90 tablet 3   finasteride (PROSCAR) 5 MG tablet Take 5 mg by mouth daily.     furosemide (LASIX) 40 MG tablet Take 1 tablet (40 mg total) by mouth daily. 90 tablet 3   latanoprost (XALATAN) 0.005 % ophthalmic solution Place 1 drop into both eyes daily.  11   rosuvastatin (CRESTOR) 40 MG tablet TAKE ONE TABLET BY MOUTH EVERY DAY 90 tablet 3   spironolactone (ALDACTONE) 25 MG tablet TAKE 0.5 TABLETS (12.5 MG TOTAL) BY MOUTH EVERY OTHER DAY. 45 tablet 1   SYMBICORT 160-4.5 MCG/ACT inhaler INHALE 2 PUFFS BY MOUTH TWICE DAILY IN THE MORNING AND EVENING 1 each 0   tamsulosin (FLOMAX) 0.4 MG CAPS capsule Take 0.4 mg by mouth at bedtime.     warfarin (COUMADIN) 5 MG tablet Monday-Thursday take 1tab daily and Friday-Sunday take 1.5tab daily 34 tablet 5   No facility-administered medications prior to visit.    Review of Systems  Constitutional:  Negative for chills, fever, malaise/fatigue and weight loss.  Respiratory:  Negative for cough, shortness of breath and wheezing.   Cardiovascular:  Negative for chest pain, orthopnea and leg swelling.  Musculoskeletal:  Negative for falls.  Neurological:  Negative for weakness.   Psychiatric/Behavioral:  Negative for substance abuse (Quit smoking ~1 month ago).      Objective:  Constitutional:Chronically ill appearing gentleman in no acute distress.  Cardio:Regular rate and rhythm. No murmurs, rubs, or gallops appreciated. Pulm:Clear to auscultation bilaterally. Decreased air sounds to upper lobes bilaterally. QTM:[id number] for extremity edema. Skin:Warm and dry. Neuro:Awake, alert, uses cane for ambulation. Psych:Pleasant mood and affect.   Vitals:   12/21/21 0931  BP: 130/80  Pulse: 91  SpO2: 96%  Weight: 135 lb (61.2 kg)  Height: 6\' 1"  (1.854 m)   96%  On RA BMI Readings from Last 3 Encounters:  12/21/21 17.81 kg/m  12/06/21 17.85 kg/m  11/23/21 17.81 kg/m   Wt Readings from Last 3 Encounters:  12/21/21 135 lb (61.2 kg)  12/06/21 135 lb 4.8 oz (61.4 kg)  11/23/21 135 lb (61.2 kg)     CBC    Component Value Date/Time   WBC 6.7 11/07/2021 1104   WBC 8.0 03/20/2021 1846   RBC 4.93 11/07/2021 1104   HGB 14.1 11/07/2021 1104   HGB 14.4 01/08/2019 1018   HCT 41.8 11/07/2021 1104   HCT 42.7 01/08/2019 1018   PLT 323 11/07/2021 1104   PLT 348 01/08/2019 1018   MCV 84.8 11/07/2021 1104   MCV 86 01/08/2019 1018   MCH 28.6 11/07/2021 1104   MCHC 33.7 11/07/2021 1104   RDW 17.4 (H) 11/07/2021 1104   RDW 15.4 01/08/2019 1018   LYMPHSABS 1.2 11/07/2021 1104   MONOABS 0.5 11/07/2021 1104   EOSABS 0.2 11/07/2021 1104   BASOSABS 0.1 11/07/2021 1104      Chest Imaging: CT Chest: 1. Stable post treatment changes involving the right hemithorax. No findings suspicious for recurrent tumor in this area. 2. Progressive soft tissue density in the right upper lobe medially adjacent to the aortic arch and SVC. This is worrisome for possible neoplastic process. Recommend either continued close follow-up (repeat CT scan in 3-4 months) versus PET-CT. 3.  No pulmonary nodules to suggest pulmonary metastatic disease. 4. Stable emphysematous  changes. 5. Stable chronic right pleural effusion. 6. Stable atherosclerotic calcifications involving the aorta and branch vessels including the coronary arteries.  PET Scan: 1. The progressive soft tissue density in the medial right upper lobe adjacent to the ascending aorta and SVC on recent diagnostic chest CT is markedly hypermetabolic consistent with recurrent disease. 2. Focal hypermetabolism identified posterior right hilar region, also suspicious for recurrence. Low level FDG accumulation in the subcarinal station may reflect hypermetabolic nodal metastasis. 3. No evidence for hypermetabolic metastases in the neck, abdomen, or pelvis. 4. Tiny right pleural effusion. 5.  Emphysema.  6.  Aortic Atherosclerois  Pulmonary Functions Testing Results:     No data to display           Echocardiogram:  06/2021: LV EF 40-45% with mildly decreased function and global hypokinesis and hypertrophy. RV systolic function is mildly rediced with normal size and normal pulmonary artery pressure. Normal mitral valve, trivial mitral valve regurgitation. No aortic valve regurgitation. Aortic dilatation with borderline dilatation of aortic root at 38 mm.     Assessment & Plan:   No diagnosis found.  Discussion: Jared Stevens. Presents to discuss biopsy of what is likely a recurrent lung mass of his R upper lobe. In discussion with the patient he is eager to pursue biopsy to allow more informed decisions regarding therapy and treatment. -Bronchoscopy with biopsy   Current Outpatient Medications:    albuterol (VENTOLIN HFA) 108 (90 Base) MCG/ACT inhaler, Inhale 1-2 puffs into the lungs every 6 (six) hours as needed for wheezing or shortness of breath. Patient states he takes only as needed., Disp: 8 g, Rfl: 0   amitriptyline (ELAVIL) 50 MG tablet, Take 50 mg by mouth at bedtime as needed. , Disp: , Rfl:    carvedilol (COREG) 3.125 MG tablet, TAKE 1 TABLET (3.125 MG TOTAL) BY MOUTH 2 (TWO) TIMES  DAILY WITH A MEAL., Disp: 180 tablet, Rfl: 3   Cyanocobalamin (B-12) 2500 MCG SUBL, Place under the tongue., Disp: , Rfl:    digoxin (LANOXIN) 0.125 MG tablet, TAKE 1 TABLET (0.125 MG TOTAL) BY MOUTH DAILY., Disp: 90 tablet, Rfl: 3   finasteride (PROSCAR) 5 MG tablet, Take 5 mg by mouth daily., Disp: , Rfl:    furosemide (LASIX) 40 MG tablet, Take 1 tablet (40 mg total) by mouth daily., Disp: 90 tablet, Rfl: 3   latanoprost (XALATAN) 0.005 % ophthalmic solution, Place 1 drop into both eyes daily., Disp: , Rfl: 11   rosuvastatin (CRESTOR) 40 MG tablet, TAKE ONE TABLET BY MOUTH EVERY DAY, Disp: 90 tablet, Rfl: 3   spironolactone (ALDACTONE) 25 MG tablet, TAKE 0.5 TABLETS (12.5 MG TOTAL) BY MOUTH EVERY OTHER DAY., Disp: 45 tablet, Rfl: 1   SYMBICORT 160-4.5 MCG/ACT inhaler, INHALE 2 PUFFS BY MOUTH TWICE DAILY IN THE MORNING AND EVENING, Disp: 1 each, Rfl: 0   tamsulosin (FLOMAX) 0.4 MG CAPS capsule, Take 0.4 mg by mouth at bedtime., Disp: , Rfl:    warfarin (COUMADIN) 5 MG tablet, Monday-Thursday take 1tab daily and Friday-Sunday take 1.5tab daily, Disp: 34 tablet, Rfl: 5  I spent 35 minutes minutes dedicated to the care of this patient on the date of this encounter to include pre-visit review of records, face-to-face time with the patient discussing conditions above, post visit ordering of testing, clinical documentation with the electronic health record, making appropriate referrals as documented, and communicating necessary findings to members of the patients  care team.   Farrel Gordon, DO [facility] 12/21/2021 10:00 AM

## 2021-12-21 NOTE — H&P (View-Only) (Signed)
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 Earlie Server on 12/06/2021.  Patient initially diagnosed with stage IIIa non-small cell lung cancer in 2017.  He had concurrent chemoradiation with carboplatin and paclitaxel completed in early 2018 in [place].  Since then has been on observation.  Patient had repeat imaging and PET scan which showed low-level FDG accumulation in the subcarinal node station as well as a hypermetabolic disease within the progressive soft tissue density of the medial right upper lobe.  He was referred to me from Dr. Earlie Server for consideration of repeat bronchoscopy and biopsy.  Most recent echocardiogram on 07/06/2021 reveals a improved ejection fraction with a EF of 40 to 45%.  He is on Coumadin for anticoagulation with his history of stroke.    BP 130/80   Pulse 91   Ht 6\' 1"  (1.854 m)   Wt 135 lb (61.2 kg)   SpO2 96%   BMI 17.81 kg/m   General: Elderly gentleman, frail, chronically ill-appearing HEENT: Poor dentition, NCAT, tracking appropriately Heart: Regular rhythm S1-S2 Lungs: Diminished breath sounds bilaterally no crackles no wheeze Abdomen: Soft nontender  Labs: Reviewed  CT scan and PET scan: Progressive soft tissue density in the medial right upper lobe adjacent to the aorta and SVC concerning for recurrent disease is hypermetabolic.  There is also low-level hypermetabolic uptake within the subcarinal station. The patient's images have been independently reviewed by me.    Assessment: History of non-small cell lung cancer Imaging concerning for recurrence of disease.  Plan: Today in the office we discussed risk benefits alternatives proceeding with bronchoscopy. Patient is agreeable to proceed, risk of pneumothorax and bleeding was discussed. We will  plan for robotic assisted navigational bronchoscopy as well as videobronchoscope endobronchial ultrasound transbronchial needle aspirations of the mediastinal nodes. Tentative bronchoscopy date on 12/27/2021 to be  Patient is on Coumadin for atrial fibrillation he will need to hold this prior to procedure. Coumadin to be held on 12/22/2021. Repeat INR prior to procedure.  I agree with documentation from Farrel Gordon, DO PGY 2.  I spent 62 minutes dedicated to the care of this patient on the date of this encounter to include pre-visit review of records, face-to-face time with the patient discussing conditions above, post visit ordering of testing, clinical documentation with the electronic health record, making appropriate referrals as documented, and communicating necessary findings to members of the patients care team.    Jared Stevens, [facility] 12/21/2021 10:20 AM

## 2021-12-23 ENCOUNTER — Other Ambulatory Visit: Payer: Self-pay | Admitting: Pulmonary Disease

## 2021-12-23 ENCOUNTER — Other Ambulatory Visit: Payer: Self-pay

## 2021-12-23 ENCOUNTER — Other Ambulatory Visit: Payer: Medicare Other

## 2021-12-23 DIAGNOSIS — Z01818 Encounter for other preprocedural examination: Secondary | ICD-10-CM

## 2021-12-23 DIAGNOSIS — I6930 Unspecified sequelae of cerebral infarction: Secondary | ICD-10-CM

## 2021-12-23 DIAGNOSIS — Z7901 Long term (current) use of anticoagulants: Secondary | ICD-10-CM

## 2021-12-23 LAB — PROTIME-INR
INR: 1.7 ratio — ABNORMAL HIGH (ref 0.8–1.0)
Prothrombin Time: 18.1 s — ABNORMAL HIGH (ref 9.6–13.1)

## 2021-12-23 LAB — SARS CORONAVIRUS 2 (TAT 6-24 HRS): SARS Coronavirus 2: NEGATIVE

## 2021-12-23 NOTE — Progress Notes (Signed)
Jared Stevens denies chest pain or shortness of breath.  Patient denies having any s/s of Covid in his household.  Patient denies any known exposure to Covid.   Jared Stevens PCP is Flossie Buffy, NP; cardiologist is Dr. Sondra Come.

## 2021-12-26 ENCOUNTER — Telehealth: Payer: Self-pay | Admitting: Nurse Practitioner

## 2021-12-26 DIAGNOSIS — E1165 Type 2 diabetes mellitus with hyperglycemia: Secondary | ICD-10-CM

## 2021-12-26 DIAGNOSIS — I2510 Atherosclerotic heart disease of native coronary artery without angina pectoris: Secondary | ICD-10-CM

## 2021-12-26 DIAGNOSIS — I5042 Chronic combined systolic (congestive) and diastolic (congestive) heart failure: Secondary | ICD-10-CM | POA: Diagnosis not present

## 2021-12-26 NOTE — Telephone Encounter (Signed)
-----   Message from Lucillie Garfinkel, [place] sent at 12/26/2021  9:59 AM EDT ----- Pt states he was instructed to stop his medication on 7/27 due to surgery on 12/27/21. Appt scheduled in 2 weeks for INR recheck.  ----- Message ----- From: Flossie Buffy, NP Sent: 12/26/2021   8:39 AM EDT To: Lucillie Garfinkel, CMA  Coumadin is sub therapeutic. Take coumadin 10mg  today and then resume prescription at 5mg  Mon-Thurs and 7.5mg  Fri-Sun. Schedule nurse visit for repeat INR in 2weeks

## 2021-12-26 NOTE — Telephone Encounter (Signed)
Scheduled for bronchoscopy 12/27/21. He was given instruction on when to stop and start coumadin per pulmonology.

## 2021-12-26 NOTE — Telephone Encounter (Signed)
Noted  

## 2021-12-27 ENCOUNTER — Ambulatory Visit (HOSPITAL_COMMUNITY): Payer: Medicare Other | Admitting: Anesthesiology

## 2021-12-27 ENCOUNTER — Ambulatory Visit (HOSPITAL_COMMUNITY): Payer: Medicare Other

## 2021-12-27 ENCOUNTER — Other Ambulatory Visit: Payer: Self-pay

## 2021-12-27 ENCOUNTER — Ambulatory Visit (HOSPITAL_COMMUNITY)
Admission: RE | Admit: 2021-12-27 | Discharge: 2021-12-27 | Disposition: A | Discharge status: Home / Self Care | Payer: Medicare Other | Attending: Pulmonary Disease | Admitting: Pulmonary Disease

## 2021-12-27 ENCOUNTER — Encounter (HOSPITAL_COMMUNITY)
Admission: RE | Disposition: A | Discharge status: Home / Self Care | Payer: Self-pay | Source: Home / Self Care | Attending: Pulmonary Disease

## 2021-12-27 ENCOUNTER — Ambulatory Visit: Payer: Medicare Other

## 2021-12-27 ENCOUNTER — Ambulatory Visit (HOSPITAL_BASED_OUTPATIENT_CLINIC_OR_DEPARTMENT_OTHER): Payer: Medicare Other | Admitting: Anesthesiology

## 2021-12-27 ENCOUNTER — Encounter (HOSPITAL_COMMUNITY): Payer: Self-pay | Admitting: Pulmonary Disease

## 2021-12-27 DIAGNOSIS — I4891 Unspecified atrial fibrillation: Secondary | ICD-10-CM | POA: Insufficient documentation

## 2021-12-27 DIAGNOSIS — I5021 Acute systolic (congestive) heart failure: Secondary | ICD-10-CM | POA: Diagnosis not present

## 2021-12-27 DIAGNOSIS — Z85118 Personal history of other malignant neoplasm of bronchus and lung: Secondary | ICD-10-CM | POA: Insufficient documentation

## 2021-12-27 DIAGNOSIS — R918 Other nonspecific abnormal finding of lung field: Secondary | ICD-10-CM | POA: Diagnosis not present

## 2021-12-27 DIAGNOSIS — I255 Ischemic cardiomyopathy: Secondary | ICD-10-CM | POA: Diagnosis not present

## 2021-12-27 DIAGNOSIS — I2510 Atherosclerotic heart disease of native coronary artery without angina pectoris: Secondary | ICD-10-CM | POA: Diagnosis not present

## 2021-12-27 DIAGNOSIS — Z7901 Long term (current) use of anticoagulants: Secondary | ICD-10-CM | POA: Insufficient documentation

## 2021-12-27 DIAGNOSIS — I509 Heart failure, unspecified: Secondary | ICD-10-CM | POA: Diagnosis not present

## 2021-12-27 DIAGNOSIS — Z8673 Personal history of transient ischemic attack (TIA), and cerebral infarction without residual deficits: Secondary | ICD-10-CM | POA: Insufficient documentation

## 2021-12-27 DIAGNOSIS — Z87891 Personal history of nicotine dependence: Secondary | ICD-10-CM | POA: Diagnosis not present

## 2021-12-27 DIAGNOSIS — C3411 Malignant neoplasm of upper lobe, right bronchus or lung: Secondary | ICD-10-CM | POA: Diagnosis not present

## 2021-12-27 DIAGNOSIS — E119 Type 2 diabetes mellitus without complications: Secondary | ICD-10-CM | POA: Diagnosis not present

## 2021-12-27 DIAGNOSIS — J90 Pleural effusion, not elsewhere classified: Secondary | ICD-10-CM | POA: Diagnosis not present

## 2021-12-27 DIAGNOSIS — Z01818 Encounter for other preprocedural examination: Secondary | ICD-10-CM

## 2021-12-27 HISTORY — PX: BRONCHIAL BIOPSY: SHX5109

## 2021-12-27 HISTORY — PX: BRONCHIAL NEEDLE ASPIRATION BIOPSY: SHX5106

## 2021-12-27 LAB — BASIC METABOLIC PANEL
Anion gap: 4 — ABNORMAL LOW (ref 5–15)
BUN: 17 mg/dL (ref 8–23)
CO2: 30 mmol/L (ref 22–32)
Calcium: 9.4 mg/dL (ref 8.9–10.3)
Chloride: 108 mmol/L (ref 98–111)
Creatinine, Ser: 0.91 mg/dL (ref 0.61–1.24)
GFR, Estimated: >60 mL/min (ref >60)
Glucose, Bld: 87 mg/dL (ref 70–99)
Potassium: 4.9 mmol/L (ref 3.5–5.1)
Sodium: 142 mmol/L (ref 135–145)

## 2021-12-27 LAB — CBC
HCT: 46.2 % (ref 39.0–52.0)
Hemoglobin: 15.1 g/dL (ref 13.0–17.0)
MCH: 28.1 pg (ref 26.0–34.0)
MCHC: 32.7 g/dL (ref 30.0–36.0)
MCV: 86 fL (ref 80.0–100.0)
Platelets: 298 10*3/uL (ref 150–400)
RBC: 5.37 MIL/uL (ref 4.22–5.81)
RDW: 17.2 % — ABNORMAL HIGH (ref 11.5–15.5)
WBC: 7.7 10*3/uL (ref 4.0–10.5)
nRBC: 0 % (ref 0.0–0.2)

## 2021-12-27 LAB — PROTIME-INR
INR: 1.2 (ref 0.8–1.2)
Prothrombin Time: 14.6 seconds (ref 11.4–15.2)

## 2021-12-27 LAB — APTT: aPTT: 29 seconds (ref 24–36)

## 2021-12-27 SURGERY — BRONCHOSCOPY, WITH BIOPSY USING ELECTROMAGNETIC NAVIGATION
Anesthesia: General | Laterality: Bilateral

## 2021-12-27 MED ORDER — ROCURONIUM BROMIDE 10 MG/ML (PF) SYRINGE
PREFILLED_SYRINGE | INTRAVENOUS | Status: DC | PRN
  Administered 2021-12-27: 70 mg via INTRAVENOUS

## 2021-12-27 MED ORDER — PROPOFOL 10 MG/ML IV BOLUS
INTRAVENOUS | Status: DC | PRN
  Administered 2021-12-27: 110 mg via INTRAVENOUS

## 2021-12-27 MED ORDER — FENTANYL CITRATE (PF) 250 MCG/5ML IJ SOLN: INTRAMUSCULAR | Status: DC | PRN

## 2021-12-27 MED ORDER — SUGAMMADEX SODIUM 200 MG/2ML IV SOLN
INTRAVENOUS | Status: DC | PRN
  Administered 2021-12-27: 200 mg via INTRAVENOUS

## 2021-12-27 MED ORDER — PROPOFOL 500 MG/50ML IV EMUL
INTRAVENOUS | Status: DC | PRN
  Administered 2021-12-27: 150 ug/kg/min via INTRAVENOUS

## 2021-12-27 MED ORDER — DEXAMETHASONE SODIUM PHOSPHATE 10 MG/ML IJ SOLN
INTRAMUSCULAR | Status: DC | PRN
  Administered 2021-12-27: 5 mg via INTRAVENOUS

## 2021-12-27 MED ORDER — LIDOCAINE 2% (20 MG/ML) 5 ML SYRINGE
INTRAMUSCULAR | Status: DC | PRN
  Administered 2021-12-27: 60 mg via INTRAVENOUS

## 2021-12-27 MED ORDER — FENTANYL CITRATE (PF) 100 MCG/2ML IJ SOLN
INTRAMUSCULAR | Status: DC | PRN
  Administered 2021-12-27: 100 ug via INTRAVENOUS

## 2021-12-27 MED ORDER — PHENYLEPHRINE HCL-NACL 20-0.9 MG/250ML-% IV SOLN
INTRAVENOUS | Status: DC | PRN
  Administered 2021-12-27: 40 ug/min via INTRAVENOUS

## 2021-12-27 MED ORDER — CHLORHEXIDINE GLUCONATE 0.12 % MT SOLN
OROMUCOSAL | Status: AC
  Administered 2021-12-27: 15 mL via OROMUCOSAL
  Filled 2021-12-27: qty 15

## 2021-12-27 MED ORDER — CHLORHEXIDINE GLUCONATE 0.12 % MT SOLN
15.0000 mL | Freq: Once | OROMUCOSAL | Status: AC
  Filled 2021-12-27: qty 15

## 2021-12-27 MED ORDER — LACTATED RINGERS IV SOLN: INTRAVENOUS | Status: DC

## 2021-12-27 MED ORDER — CARVEDILOL 3.125 MG PO TABS
3.1250 mg | ORAL_TABLET | Freq: Once | ORAL | Status: AC
Start: 2021-12-27 — End: 2021-12-27
  Administered 2021-12-27: 3.125 mg via ORAL
  Filled 2021-12-27: qty 1

## 2021-12-27 MED ORDER — CARVEDILOL 3.125 MG PO TABS
ORAL_TABLET | ORAL | Status: AC
  Filled 2021-12-27: qty 1

## 2021-12-27 MED ORDER — ONDANSETRON HCL 4 MG/2ML IJ SOLN
INTRAMUSCULAR | Status: DC | PRN
  Administered 2021-12-27: 4 mg via INTRAVENOUS

## 2021-12-27 NOTE — Op Note (Signed)
Video Bronchoscopy with Robotic Assisted Bronchoscopic Navigation   Date of Operation: 12/27/2021   Pre-op Diagnosis: lung mass  Post-op Diagnosis: lung mass   Surgeon: Garner Nash, DO   Assistants: None   Anesthesia: General endotracheal anesthesia  Operation: Flexible video fiberoptic bronchoscopy with robotic assistance and biopsies.  Estimated Blood Loss: Minimal  Complications: None  Indications and History: Jared Stevens is a 71 y.o. male with history of lung mass, concern for recurrence. The risks, benefits, complications, treatment options and expected outcomes were discussed with the patient.  The possibilities of pneumothorax, pneumonia, reaction to medication, pulmonary aspiration, perforation of a viscus, bleeding, failure to diagnose a condition and creating a complication requiring transfusion or operation were discussed with the patient who freely signed the consent.    Description of Procedure: The patient was seen in the Preoperative Area, was examined and was deemed appropriate to proceed.  The patient was taken to [facility] endoscopy room 3, identified as Jared Stevens and the procedure verified as Flexible Video Fiberoptic Bronchoscopy.  A Time Out was held and the above information confirmed.   Prior to the date of the procedure a high-resolution CT scan of the chest was performed. Utilizing ION software program a virtual tracheobronchial tree was generated to allow the creation of distinct navigation pathways to the patient's parenchymal abnormalities. After being taken to the operating room general anesthesia was initiated and the patient  was orally intubated. The video fiberoptic bronchoscope was introduced via the endotracheal tube and a general inspection was performed which showed normal right and left lung anatomy, aspiration of the bilateral mainstems was completed to remove any remaining secretions. Robotic catheter inserted into patient's endotracheal tube.    Target #1 RUL medial lesion: The distinct navigation pathways prepared prior to this procedure were then utilized to navigate to patient's lesion identified on CT scan. The robotic catheter was secured into place and the vision probe was withdrawn.  Lesion location was approximated using fluoroscopy and dimensional cone beam CT imaging under fluoroscopic guidance  transbronchial needle biopsies, and transbronchial forceps biopsies were performed to be sent for cytology and pathology.   At the end of the procedure a general airway inspection was performed and there was no evidence of active bleeding. The bronchoscope was removed.  The patient tolerated the procedure well. There was no significant blood loss and there were no obvious complications. A post-procedural chest x-ray is pending.  Samples Target #1: 1. Transbronchial Wang needle biopsies from RUL 2. Transbronchial forceps biopsies from RUL  Plans:  The patient will be discharged from the PACU to home when recovered from anesthesia and after chest x-ray is reviewed. We will review the cytology, pathology results with the patient when they become available. Outpatient followup will be with Garner Nash, DO.  Garner Nash, DO [facility] 12/27/2021 1:17 PM

## 2021-12-27 NOTE — Interval H&P Note (Signed)
History and Physical Interval Note:  12/27/2021 11:01 AM  Jared Stevens  has presented today for surgery, with the diagnosis of lung mass.  The various methods of treatment have been discussed with the patient and family. After consideration of risks, benefits and other options for treatment, the patient has consented to  Procedure(s) with comments: ROBOTIC ASSISTED NAVIGATIONAL BRONCHOSCOPY (Bilateral) - ion w/ CIOS as a surgical intervention.  The patient's history has been reviewed, patient examined, no change in status, stable for surgery.  I have reviewed the patient's chart and labs.  Questions were answered to the patient's satisfaction.     [place]

## 2021-12-27 NOTE — Discharge Instructions (Signed)
Flexible Bronchoscopy, Care After This sheet gives you information about how to care for yourself after your test. Your doctor may also give you more specific instructions. If you have problems or questions, contact your doctor. Follow these instructions at home: Eating and drinking Do not eat or drink anything (not even water) for 2 hours after your test, or until your numbing medicine (local anesthetic) wears off. When your numbness is gone and your cough and gag reflexes have come back, you may: Eat only soft foods. Slowly drink liquids. The day after the test, go back to your normal diet. Driving Do not drive for 24 hours if you were given a medicine to help you relax (sedative). Do not drive or use heavy machinery while taking prescription pain medicine. General instructions  Take over-the-counter and prescription medicines only as told by your doctor. Return to your normal activities as told. Ask what activities are safe for you. Do not use any products that have nicotine or tobacco in them. This includes cigarettes and e-cigarettes. If you need help quitting, ask your doctor. Keep all follow-up visits as told by your doctor. This is important. It is very important if you had a tissue sample (biopsy) taken. Get help right away if: You have shortness of breath that gets worse. You get light-headed. You feel like you are going to pass out (faint). You have chest pain. You cough up: More than a little blood. More blood than before. Summary Do not eat or drink anything (not even water) for 2 hours after your test, or until your numbing medicine wears off. Do not use cigarettes. Do not use e-cigarettes. Get help right away if you have chest pain.  This information is not intended to replace advice given to you by your health care provider. Make sure you discuss any questions you have with your health care provider. Document Released: 03/12/2009 Document Revised: 04/27/2017 Document  Reviewed: 06/02/2016 Elsevier Patient Education  2020 [employer].

## 2021-12-27 NOTE — Anesthesia Preprocedure Evaluation (Signed)
Anesthesia Evaluation  Patient identified by MRN, date of birth, ID band Patient awake    Reviewed: Allergy & Precautions, NPO status , Patient's Chart, lab work & pertinent test results, reviewed documented beta blocker date and time   History of Anesthesia Complications Negative for: history of anesthetic complications  Airway Mallampati: I  TM Distance: >3 FB Neck ROM: Full    Dental  (+) Missing,    Pulmonary former smoker,    breath sounds clear to auscultation       Cardiovascular (-) angina+ CAD and +CHF  (-) dysrhythmias (-) pacemaker Rhythm:Regular  1. Left ventricular ejection fraction, by estimation, is 40 to 45%. The  left ventricle has mildly decreased function. The left ventricle  demonstrates global hypokinesis. There is moderate left ventricular  hypertrophy. Left ventricular diastolic  parameters are consistent with Grade I diastolic dysfunction (impaired  relaxation).  2. Right ventricular systolic function is mildly reduced. The right  ventricular size is normal. There is normal pulmonary artery systolic  pressure. The estimated right ventricular systolic pressure is 24.2 mmHg.  3. The mitral valve is grossly normal. Trivial mitral valve  regurgitation.  4. The aortic valve is tricuspid. Aortic valve regurgitation is not  visualized.  5. Aortic dilatation noted. There is borderline dilatation of the aortic  root, measuring 38 mm.    ? Prox RCA to Dist RCA lesion is 100% stenosed. ? LV end diastolic pressure is low. ? There is severe left ventricular systolic dysfunction.   There is dilated left ventricle with severe LV dysfunction with EF estimate less than 15%. Low right heart pressure secondary to recent significant diuresis.  Single-vessel coronary obstructive disease with total proximal occlusion of the RCA with evidence for left to right distal collateralization. Normal large LAD and left  circumflex vessels with collateralization to the distal RCA.  RECOMMENDATION: Medical therapy for severe combined LV systolic and diastolic function with potential ultimate initiation of Entresto, aldosterone blockade, diuretics as blood pressure allow.  Consider of evaluation for LifeVest and subsequent ICD candidacy.     Neuro/Psych 2 years ago, left sided symptoms  Neuromuscular disease CVA, Residual Symptoms negative psych ROS   GI/Hepatic negative GI ROS, Neg liver ROS,   Endo/Other  diabetesLab Results      Component                Value               Date                      HGBA1C                   6.7 (A)             11/23/2021             Renal/GU negative Renal ROSLab Results      Component                Value               Date                      CREATININE               0.91                12/27/2021  Musculoskeletal negative musculoskeletal ROS (+)   Abdominal   Peds  Hematology negative hematology ROS (+) Lab Results      Component                Value               Date                      WBC                      7.7                 12/27/2021                HGB                      15.1                12/27/2021                HCT                      46.2                12/27/2021                MCV                      86.0                12/27/2021                PLT                      298                 12/27/2021              Anesthesia Other Findings   Reproductive/Obstetrics                             Anesthesia Physical Anesthesia Plan  ASA: 3  Anesthesia Plan: General   Post-op Pain Management: Minimal or no pain anticipated   Induction: Intravenous  PONV Risk Score and Plan: 2 and Propofol infusion and Treatment may vary due to age or medical condition  Airway Management Planned: Oral ETT  Additional Equipment: None  Intra-op Plan:   Post-operative Plan: Extubation in  OR  Informed Consent: I have reviewed the patients History and Physical, chart, labs and discussed the procedure including the risks, benefits and alternatives for the proposed anesthesia with the patient or authorized representative who has indicated his/her understanding and acceptance.     Dental advisory given  Plan Discussed with: CRNA  Anesthesia Plan Comments:         Anesthesia Quick Evaluation

## 2021-12-27 NOTE — Transfer of Care (Signed)
Immediate Anesthesia Transfer of Care Note  Patient: Jared Stevens  Procedure(s) Performed: ROBOTIC ASSISTED NAVIGATIONAL BRONCHOSCOPY (Bilateral) BRONCHIAL NEEDLE ASPIRATION BIOPSIES BRONCHIAL BIOPSIES  Patient Location: PACU  Anesthesia Type:General  Level of Consciousness: awake, alert  and oriented  Airway & Oxygen Therapy: Patient Spontanous Breathing and Patient connected to face mask oxygen  Post-op Assessment: Report given to RN and Post -op Vital signs reviewed and stable  Post vital signs: Reviewed and stable  Last Vitals:  Vitals Value Taken Time  BP 90/77 12/27/21 1300  Temp 36.7 C 12/27/21 1300  Pulse 71 12/27/21 1311  Resp 15 12/27/21 1311  SpO2 98 % 12/27/21 1311  Vitals shown include unvalidated device data.  Last Pain:  Vitals:   12/27/21 1300  TempSrc:   PainSc: 0-No pain      Patients Stated Pain Goal: 3 (16/10/96 0454)  Complications: No notable events documented.

## 2021-12-27 NOTE — Anesthesia Procedure Notes (Signed)
Procedure Name: Intubation Date/Time: 12/27/2021 12:11 PM  Performed by: Mariea Clonts, CRNAPre-anesthesia Checklist: Patient identified, Emergency Drugs available, Suction available and Patient being monitored Patient Re-evaluated:Patient Re-evaluated prior to induction Oxygen Delivery Method: Circle System Utilized Preoxygenation: Pre-oxygenation with 100% oxygen Induction Type: IV induction Ventilation: Mask ventilation without difficulty Laryngoscope Size: Mac and 4 Grade View: Grade I Tube type: Oral Tube size: 8.5 mm Number of attempts: 1 Airway Equipment and Method: Stylet Placement Confirmation: ETT inserted through vocal cords under direct vision, positive ETCO2 and breath sounds checked- equal and bilateral Secured at: 23 cm Tube secured with: Tape Dental Injury: Teeth and Oropharynx as per pre-operative assessment

## 2021-12-28 ENCOUNTER — Inpatient Hospital Stay: Payer: Medicare Other | Admitting: Internal Medicine

## 2021-12-28 ENCOUNTER — Inpatient Hospital Stay: Payer: Medicare Other | Attending: Internal Medicine

## 2021-12-28 DIAGNOSIS — C3491 Malignant neoplasm of unspecified part of right bronchus or lung: Secondary | ICD-10-CM | POA: Insufficient documentation

## 2021-12-28 DIAGNOSIS — J449 Chronic obstructive pulmonary disease, unspecified: Secondary | ICD-10-CM | POA: Insufficient documentation

## 2021-12-28 DIAGNOSIS — R5383 Other fatigue: Secondary | ICD-10-CM | POA: Insufficient documentation

## 2021-12-28 DIAGNOSIS — Z79899 Other long term (current) drug therapy: Secondary | ICD-10-CM | POA: Insufficient documentation

## 2021-12-28 DIAGNOSIS — I5042 Chronic combined systolic (congestive) and diastolic (congestive) heart failure: Secondary | ICD-10-CM | POA: Insufficient documentation

## 2021-12-28 DIAGNOSIS — I2510 Atherosclerotic heart disease of native coronary artery without angina pectoris: Secondary | ICD-10-CM | POA: Insufficient documentation

## 2021-12-28 DIAGNOSIS — Z5111 Encounter for antineoplastic chemotherapy: Secondary | ICD-10-CM | POA: Insufficient documentation

## 2021-12-28 DIAGNOSIS — R0602 Shortness of breath: Secondary | ICD-10-CM | POA: Insufficient documentation

## 2021-12-28 NOTE — Anesthesia Postprocedure Evaluation (Signed)
Anesthesia Post Note  Patient: Jared Stevens  Procedure(s) Performed: ROBOTIC ASSISTED NAVIGATIONAL BRONCHOSCOPY (Bilateral) BRONCHIAL NEEDLE ASPIRATION BIOPSIES BRONCHIAL BIOPSIES     Patient location during evaluation: PACU Anesthesia Type: General Level of consciousness: awake and alert Pain management: pain level controlled Vital Signs Assessment: post-procedure vital signs reviewed and stable Respiratory status: spontaneous breathing, nonlabored ventilation, respiratory function stable and patient connected to nasal cannula oxygen Cardiovascular status: blood pressure returned to baseline and stable Postop Assessment: no apparent nausea or vomiting Anesthetic complications: no   No notable events documented.  Last Vitals:  Vitals:   12/27/21 1315 12/27/21 1330  BP: 101/65 103/77  Pulse: 72 70  Resp: 15 14  Temp:  36.7 C  SpO2: 97% 95%    Last Pain:  Vitals:   12/27/21 1300  TempSrc:   PainSc: 0-No pain                 Chue Berkovich

## 2021-12-29 ENCOUNTER — Encounter (HOSPITAL_COMMUNITY): Payer: Self-pay | Admitting: Pulmonary Disease

## 2021-12-29 LAB — CYTOLOGY - NON PAP

## 2022-01-02 ENCOUNTER — Telehealth: Payer: Self-pay

## 2022-01-02 NOTE — Telephone Encounter (Signed)
Pt called to r/s  his missed 12/28/21 appts. I have connected the pt with the scheduler and has be r/s for 01/05/22. Pt also advised he needs transportation assistance to these appts. I have emailed [name] in Transportation to get pt setup.

## 2022-01-03 ENCOUNTER — Ambulatory Visit: Payer: Medicare Other

## 2022-01-05 ENCOUNTER — Inpatient Hospital Stay: Payer: Medicare Other

## 2022-01-05 ENCOUNTER — Telehealth: Payer: Self-pay | Admitting: Internal Medicine

## 2022-01-05 ENCOUNTER — Telehealth: Payer: Self-pay

## 2022-01-05 ENCOUNTER — Other Ambulatory Visit: Payer: Self-pay | Admitting: Medical Oncology

## 2022-01-05 ENCOUNTER — Inpatient Hospital Stay (HOSPITAL_BASED_OUTPATIENT_CLINIC_OR_DEPARTMENT_OTHER): Payer: Medicare Other | Admitting: Internal Medicine

## 2022-01-05 ENCOUNTER — Other Ambulatory Visit: Payer: Self-pay

## 2022-01-05 VITALS — BP 108/70 | HR 73 | Temp 98.4°F | Resp 18 | Wt 134.1 lb

## 2022-01-05 DIAGNOSIS — J449 Chronic obstructive pulmonary disease, unspecified: Secondary | ICD-10-CM | POA: Diagnosis not present

## 2022-01-05 DIAGNOSIS — Z79899 Other long term (current) drug therapy: Secondary | ICD-10-CM | POA: Diagnosis not present

## 2022-01-05 DIAGNOSIS — C3491 Malignant neoplasm of unspecified part of right bronchus or lung: Secondary | ICD-10-CM

## 2022-01-05 DIAGNOSIS — I5042 Chronic combined systolic (congestive) and diastolic (congestive) heart failure: Secondary | ICD-10-CM | POA: Diagnosis not present

## 2022-01-05 DIAGNOSIS — R5383 Other fatigue: Secondary | ICD-10-CM | POA: Diagnosis not present

## 2022-01-05 DIAGNOSIS — R0602 Shortness of breath: Secondary | ICD-10-CM | POA: Diagnosis not present

## 2022-01-05 DIAGNOSIS — Z5111 Encounter for antineoplastic chemotherapy: Secondary | ICD-10-CM

## 2022-01-05 DIAGNOSIS — I2510 Atherosclerotic heart disease of native coronary artery without angina pectoris: Secondary | ICD-10-CM | POA: Diagnosis not present

## 2022-01-05 LAB — CMP (CANCER CENTER ONLY)
ALT: 14 U/L (ref 0–44)
AST: 15 U/L (ref 15–41)
Albumin: 3.7 g/dL (ref 3.5–5.0)
Alkaline Phosphatase: 81 U/L (ref 38–126)
Anion gap: 6 (ref 5–15)
BUN: 20 mg/dL (ref 8–23)
CO2: 29 mmol/L (ref 22–32)
Calcium: 9.1 mg/dL (ref 8.9–10.3)
Chloride: 106 mmol/L (ref 98–111)
Creatinine: 0.78 mg/dL (ref 0.61–1.24)
GFR, Estimated: >60 mL/min (ref >60)
Glucose, Bld: 117 mg/dL — ABNORMAL HIGH (ref 70–99)
Potassium: 3.6 mmol/L (ref 3.5–5.1)
Sodium: 141 mmol/L (ref 135–145)
Total Bilirubin: 0.3 mg/dL (ref 0.3–1.2)
Total Protein: 7.3 g/dL (ref 6.5–8.1)

## 2022-01-05 LAB — CBC WITH DIFFERENTIAL (CANCER CENTER ONLY)
Abs Immature Granulocytes: 0.03 10*3/uL (ref 0.00–0.07)
Basophils Absolute: 0 10*3/uL (ref 0.0–0.1)
Basophils Relative: 1 %
Eosinophils Absolute: 0.2 10*3/uL (ref 0.0–0.5)
Eosinophils Relative: 4 %
HCT: 42 % (ref 39.0–52.0)
Hemoglobin: 14.1 g/dL (ref 13.0–17.0)
Immature Granulocytes: 1 %
Lymphocytes Relative: 18 %
Lymphs Abs: 1.2 10*3/uL (ref 0.7–4.0)
MCH: 27.9 pg (ref 26.0–34.0)
MCHC: 33.6 g/dL (ref 30.0–36.0)
MCV: 83 fL (ref 80.0–100.0)
Monocytes Absolute: 0.4 10*3/uL (ref 0.1–1.0)
Monocytes Relative: 7 %
Neutro Abs: 4.6 10*3/uL (ref 1.7–7.7)
Neutrophils Relative %: 69 %
Platelet Count: 317 10*3/uL (ref 150–400)
RBC: 5.06 MIL/uL (ref 4.22–5.81)
RDW: 16.2 % — ABNORMAL HIGH (ref 11.5–15.5)
WBC Count: 6.6 10*3/uL (ref 4.0–10.5)
nRBC: 0 % (ref 0.0–0.2)

## 2022-01-05 NOTE — Addendum Note (Signed)
Addended by: Ardeen [name] on: 01/05/2022 11:03 AM   Modules accepted: Orders

## 2022-01-05 NOTE — Telephone Encounter (Signed)
Pt left a message stating he was calling back to advise of the name of the provider who treated him in [place]. Pt states that providers name was [facility].

## 2022-01-05 NOTE — Telephone Encounter (Signed)
Scheduled per 08/10 los, patient has been called and notified. Informed transportation scheduler that patient will need help getting set up transportation for his appointments.

## 2022-01-05 NOTE — Progress Notes (Signed)
START ON PATHWAY REGIMEN - Non-Small Cell Lung     A cycle is every 7 days, concurrent with RT:     Paclitaxel      Carboplatin   **Always confirm dose/schedule in your pharmacy ordering system**  Patient Characteristics: Preoperative or Nonsurgical Candidate (Clinical Staging), Stage III - Nonsurgical Candidate (Nonsquamous and Squamous), PS = 0, 1 Therapeutic Status: Preoperative or Nonsurgical Candidate (Clinical Staging) AJCC T Category: cTX AJCC N Category: cNX AJCC M Category: cM0 AJCC 8 Stage Grouping: IIIA ECOG Performance Status: 1 Intent of Therapy: Curative Intent, Discussed with Patient

## 2022-01-05 NOTE — Progress Notes (Signed)
Jared [phone]   [phone]  OFFICE PROGRESS NOTE  [name], [name], NP [place] [address]  DIAGNOSIS: Recurrent non-small cell lung cancer, adenocarcinoma that was initially diagnosed as stage IIIA non-small cell lung cancer, adenocarcinoma diagnosed in 2017  PRIOR THERAPY: status post a course of concurrent chemoradiation with weekly carboplatin and paclitaxel completed in early 2018 at [facility]. The patient has been in observation since that time.  CURRENT THERAPY: Retreatment with concurrent chemoradiation with weekly carboplatin for AUC of 2 and paclitaxel 45 Mg/M2.  First dose January 16, 2022.  INTERVAL HISTORY: Jared Stevens 71 y.o. male returns to the clinic today for follow-up visit.  The patient is feeling fine today with no concerning complaints except for mild shortness of breath with exertion but no significant chest pain, cough or hemoptysis.  He lost few pounds recently.  He quit smoking a month ago.  He denied having any current nausea, vomiting, diarrhea or constipation.  He has no headache or visual changes.  He denied having any fever or chills.  He underwent repeat bronchoscopy under the care of Dr. Valeta Harms and the final pathology was consistent with recurrent adenocarcinoma of the lung.  The patient is here today for evaluation and discussion of his treatment options.   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  MEDICATIONS:  Current Outpatient Medications  Medication Sig Dispense Refill   albuterol (VENTOLIN HFA) 108 (90 Base) MCG/ACT inhaler Inhale 1-2 puffs into the lungs every 6 (six) hours as needed for wheezing or shortness of breath. Patient states he takes only as needed. 8 g 0   amitriptyline (ELAVIL) 50 MG tablet Take 50 mg by mouth at bedtime as needed for sleep.     budesonide-formoterol (SYMBICORT) 160-4.5 MCG/ACT inhaler INHALE TWO PUFFS BY MOUTH TWICE DAILY IN THE MORNING AND EVENING 10.2 g 0   carvedilol (COREG) 3.125 MG tablet TAKE 1 TABLET (3.125 MG TOTAL) BY MOUTH 2 (TWO) TIMES DAILY WITH A MEAL. 180 tablet 3   Cyanocobalamin (B-12) 2500 MCG SUBL Place 2,500 mcg under the tongue daily.     digoxin (LANOXIN) 0.125 MG tablet TAKE 1 TABLET (0.125 MG TOTAL) BY MOUTH DAILY. 90 tablet 3   finasteride (PROSCAR) 5 MG tablet Take 5 mg by mouth daily.     furosemide (LASIX) 40 MG tablet Take 40 mg by mouth daily.  latanoprost (XALATAN) 0.005 % ophthalmic solution Place 1 drop into both eyes daily.  11   rosuvastatin (CRESTOR) 40 MG tablet TAKE ONE TABLET BY MOUTH EVERY DAY 90 tablet 3   spironolactone (ALDACTONE) 25 MG tablet TAKE 0.5 TABLETS (12.5 MG TOTAL) BY MOUTH EVERY OTHER DAY. 45 tablet 1   tamsulosin (FLOMAX) 0.4 MG CAPS capsule Take 0.4 mg by mouth at bedtime.     warfarin (COUMADIN) 5 MG tablet Monday-Thursday take 1tab daily and Friday-Sunday take 1.5tab daily (Patient taking differently: Take 5-7.5 mg by mouth See admin instructions.  Monday-Thursday take 5 mg daily and Fri, Sat and Sun take 7.5 mg daily) 34 tablet 5   No current facility-administered medications for this visit.    SURGICAL HISTORY:  Past Surgical History:  Procedure Laterality Date   Arm Surgery     BRONCHIAL BIOPSY  12/27/2021   Procedure: BRONCHIAL BIOPSIES;  Surgeon: Garner Nash, DO;  Location: [facility];  Service: Pulmonary;;   BRONCHIAL NEEDLE ASPIRATION BIOPSY  12/27/2021   Procedure: BRONCHIAL NEEDLE ASPIRATION BIOPSIES;  Surgeon: Garner Nash, DO;  Location: [facility];  Service: Pulmonary;;   HERNIA REPAIR     IR ANGIO INTRA EXTRACRAN SEL COM CAROTID INNOMINATE UNI L MOD SED  10/21/2018   IR ANGIO VERTEBRAL SEL SUBCLAVIAN INNOMINATE UNI R MOD SED  10/21/2018   IR CT HEAD LTD  10/21/2018   IR PERCUTANEOUS ART THROMBECTOMY/INFUSION INTRACRANIAL INC DIAG ANGIO  10/21/2018   IR THORACENTESIS ASP PLEURAL SPACE W/IMG GUIDE  09/27/2018   RADIOLOGY WITH ANESTHESIA N/A 10/21/2018   Procedure: RADIOLOGY WITH ANESTHESIA;  Surgeon: Luanne Bras, MD;  Location: [facility];  Service: Radiology;  Laterality: N/A;   REPLANTATION THUMB     RIGHT/LEFT HEART CATH AND CORONARY ANGIOGRAPHY N/A 10/01/2018   Procedure: RIGHT/LEFT HEART CATH AND CORONARY ANGIOGRAPHY;  Surgeon: Troy Sine, MD;  Location: [facility];  Service: Cardiovascular;  Laterality: N/A;    REVIEW OF SYSTEMS:  Constitutional: positive for fatigue Eyes: negative Ears, nose, mouth, throat, and face: negative Respiratory: negative Cardiovascular: negative Gastrointestinal: negative Genitourinary:negative Integument/breast: negative Hematologic/lymphatic: negative Musculoskeletal:negative Neurological: negative Behavioral/Psych: negative Endocrine: negative Allergic/Immunologic: negative   PHYSICAL EXAMINATION: General appearance: alert, cooperative, fatigued, and no distress Head: Normocephalic, without obvious abnormality, atraumatic Neck: no adenopathy, no JVD,  supple, symmetrical, trachea midline, and thyroid not enlarged, symmetric, no tenderness/mass/nodules Lymph nodes: Cervical, supraclavicular, and axillary nodes normal. Resp: clear to auscultation bilaterally Back: symmetric, no curvature. ROM normal. No CVA tenderness. Cardio: regular rate and rhythm, S1, S2 normal, no murmur, click, rub or gallop GI: soft, non-tender; bowel sounds normal; no masses,  no organomegaly Extremities: extremities normal, atraumatic, no cyanosis or edema Neurologic: Alert and oriented X 3, normal strength and tone. Normal symmetric reflexes. Normal coordination and gait  ECOG PERFORMANCE STATUS: 1 - Symptomatic but completely ambulatory  Blood pressure 108/70, pulse 73, temperature 98.4 F (36.9 C), temperature source Oral, resp. rate 18, weight 134 lb 2 oz (60.8 kg), SpO2 100 %.  LABORATORY DATA: Lab Results  Component Value Date   WBC 6.6 01/05/2022   HGB 14.1 01/05/2022   HCT 42.0 01/05/2022   MCV 83.0 01/05/2022   PLT 317 01/05/2022      Chemistry      Component Value Date/Time   NA 141 01/05/2022 0905   NA 138 06/01/2021 1239   K 3.6 01/05/2022 0905   CL 106 01/05/2022 0905   CO2 29 01/05/2022 0905   BUN 20 01/05/2022  0905   BUN 17 06/01/2021 1239   CREATININE 0.78 01/05/2022 0905      Component Value Date/Time   CALCIUM 9.1 01/05/2022 0905   ALKPHOS 81 01/05/2022 0905   AST 15 01/05/2022 0905   ALT 14 01/05/2022 0905   BILITOT 0.3 01/05/2022 0905       RADIOGRAPHIC STUDIES DG Chest Port 1 View  Result Date: 12/27/2021 CLINICAL DATA:  Status post bronchoscopy with biopsy EXAM: PORTABLE CHEST 1 VIEW COMPARISON:  Radiograph 10/24/2018, CT 11/07/2021 FINDINGS: Unchanged rightward mediastinal shift. Similar right perihilar opacity and small right pleural effusion. No pneumothorax. Left lung is clear. No acute osseous abnormality. IMPRESSION: Similar right perihilar opacity in small right pleural effusion. No pneumothorax. Electronically  Signed   By: Maurine Simmering M.D.   On: 12/27/2021 13:51   DG C-ARM BRONCHOSCOPY  Result Date: 12/27/2021 C-ARM BRONCHOSCOPY: Fluoroscopy was utilized by the requesting physician.  No radiographic interpretation.     ASSESSMENT AND PLAN: This is a very pleasant 71 years old African-American male with recurrent non-small cell lung cancer, adenocarcinoma diagnosed in August 2023.  The patient has a history of a stage IIIa non-small cell lung cancer, adenocarcinoma status post a course of concurrent chemoradiation in [place] in 2017. The patient has been on observation since his treatment in 2017. He had repeat CT scan of the chest performed recently. His scan showed stable appearance of the treatment changes involving the right hemothorax but there was progressive soft tissue density in the right upper lobe medially adjacent to the aortic arch and SVC worrisome for possible neoplastic process. The patient had a PET scan performed recently and that showed the progressive soft tissue density in the medial right upper lobe adjacent to the ascending aorta and SVC on recent CT scan is markedly hypermetabolic consistent with disease recurrence.  There was low-level FDG accumulation in the subcarinal station may reflect hypermetabolic nodal metastasis but no evidence of hypermetabolic metastatic disease in the neck, abdomen or pelvis. The patient underwent repeat bronchoscopy under the care of Dr. Valeta Harms and the final pathology was consistent with recurrent adenocarcinoma of the lung. I had a lengthy discussion with the patient today about his current condition and treatment options. We discussed his case at the weekly thoracic conference and the recommendation is to proceed with repeat course of concurrent chemoradiation. I discussed this option with the patient and recommended for him a course of concurrent chemoradiation with weekly carboplatin for AUC of 2 and paclitaxel 45 Mg/M2. I will refer the patient to  Dr. Lisbeth Renshaw for consideration of the radiotherapy portion. The patient is expected to start the first cycle of this treatment on January 16, 2022. He may have a transportation issue and he will need assistance from the social worker as well as the transportation at [employer]. The patient will have a chemotherapy education class before the first dose of his treatment. I will see him back for follow-up visit 1 week after the start of his treatment for management of any adverse effect of his treatment. He was advised to call immediately if he has any other concerning symptoms in the interval. The patient voices understanding of current disease status and treatment options and is in agreement with the current care plan. All questions were answered. The patient knows to call the clinic with any problems, questions or concerns. We can certainly see the patient much sooner if necessary.  Disclaimer: This note was dictated with voice recognition software. Similar sounding words can  inadvertently be transcribed and may not be corrected upon review.

## 2022-01-06 ENCOUNTER — Other Ambulatory Visit: Payer: Self-pay

## 2022-01-06 ENCOUNTER — Inpatient Hospital Stay: Payer: Medicare Other

## 2022-01-06 ENCOUNTER — Other Ambulatory Visit: Payer: Self-pay | Admitting: *Deleted

## 2022-01-06 NOTE — Progress Notes (Signed)
The proposed treatment discussed in conference is for discussion purpose only and is not a binding recommendation.  The patients have not been physically examined, or presented with their treatment options.  Therefore, final treatment plans cannot be decided.  

## 2022-01-06 NOTE — Progress Notes (Signed)
[place] Work  Clinical Social Work was referred by medical provider for transportation needs.  Clinical Social Worker contacted patient by phone to offer support and assess for needs.  Patient stated he spoke with Darrick Meigs, [facility] Transportation Coordinator yesterday and was arranged transportation for 8/17 and 8/21.  Patient agreed to apply for [employer] [place] which CSW will help facilitate.  CSW to also apply for [employer] transportation.  Provided him with CSW contact information.  Patient reported no other concerns or needs at this time.  Margaree Mackintosh, LCSW  Clinical Social Worker [place]        Patient is participating in a Managed Medicaid Plan:  Yes

## 2022-01-09 NOTE — Progress Notes (Signed)
Pharmacist Chemotherapy Monitoring - Initial Assessment    Anticipated start date: 01/16/22   The following has been reviewed per standard work regarding the patient's treatment regimen: The patient's diagnosis, treatment plan and drug doses, and organ/hematologic function Lab orders and baseline tests specific to treatment regimen  The treatment plan start date, drug sequencing, and pre-medications Prior authorization status  Patient's documented medication list, including drug-drug interaction screen and prescriptions for anti-emetics and supportive care specific to the treatment regimen The drug concentrations, fluid compatibility, administration routes, and timing of the medications to be used The patient's access for treatment and lifetime cumulative dose history, if applicable  The patient's medication allergies and previous infusion related reactions, if applicable   Changes made to treatment plan:  N/A  Follow up needed:  N/A   Kennith Center, Pharm.D., CPP 01/09/2022@4 :24 PM

## 2022-01-09 NOTE — Progress Notes (Signed)
The pharmacy team has substituted IV diphenhydramine for IV cetirizine as a premedication. Patient will be monitored for hypersensitivity reaction and adverse reactions to IV cetirizine. Thanks.   Kennith Center, Pharm.D., CPP 01/09/2022@8 :49 AM

## 2022-01-10 ENCOUNTER — Ambulatory Visit: Payer: Medicare Other

## 2022-01-10 ENCOUNTER — Other Ambulatory Visit: Payer: Self-pay | Admitting: Medical Oncology

## 2022-01-10 DIAGNOSIS — Z5111 Encounter for antineoplastic chemotherapy: Secondary | ICD-10-CM

## 2022-01-10 MED ORDER — PROCHLORPERAZINE MALEATE 10 MG PO TABS: 10.0000 mg | ORAL_TABLET | Freq: Four times a day (QID) | ORAL | 0 refills | Status: DC | PRN

## 2022-01-10 NOTE — Progress Notes (Deleted)
Pt is here for INR check. Capillary stick performed on *** index finger. Performed by [place] CMA/CPT at ***. Pt tolerated stick well.    Changes in Diet: ***  Antibiotics: ***  Unusual bruising/Bleeding: ***  Goal INR =***   Last INR =***   Pt currently takes ***   Date of last dose = ***   INR today = ***   Pt advised per ***, maintain current *** dose as follows *** . Pt is aware of dosing instructions and has verbalized understanding.    Lean Fayson L. CMA/CPT

## 2022-01-12 ENCOUNTER — Other Ambulatory Visit: Payer: Self-pay

## 2022-01-12 ENCOUNTER — Other Ambulatory Visit: Payer: Self-pay | Admitting: Physician Assistant

## 2022-01-12 ENCOUNTER — Inpatient Hospital Stay: Payer: Medicare Other

## 2022-01-12 ENCOUNTER — Other Ambulatory Visit: Payer: Self-pay | Admitting: Internal Medicine

## 2022-01-12 DIAGNOSIS — C3491 Malignant neoplasm of unspecified part of right bronchus or lung: Secondary | ICD-10-CM

## 2022-01-13 MED FILL — Dexamethasone Sodium Phosphate Inj 100 MG/10ML: INTRAMUSCULAR | Qty: 1 | Status: AC

## 2022-01-16 ENCOUNTER — Inpatient Hospital Stay: Payer: Medicare Other

## 2022-01-16 ENCOUNTER — Other Ambulatory Visit: Payer: Self-pay

## 2022-01-16 VITALS — BP 110/72 | HR 73 | Temp 97.9°F | Resp 18 | Ht 73.0 in | Wt 134.0 lb

## 2022-01-16 DIAGNOSIS — R0602 Shortness of breath: Secondary | ICD-10-CM | POA: Diagnosis not present

## 2022-01-16 DIAGNOSIS — C3491 Malignant neoplasm of unspecified part of right bronchus or lung: Secondary | ICD-10-CM

## 2022-01-16 DIAGNOSIS — Z5111 Encounter for antineoplastic chemotherapy: Secondary | ICD-10-CM | POA: Diagnosis not present

## 2022-01-16 DIAGNOSIS — R5383 Other fatigue: Secondary | ICD-10-CM | POA: Diagnosis not present

## 2022-01-16 DIAGNOSIS — I5042 Chronic combined systolic (congestive) and diastolic (congestive) heart failure: Secondary | ICD-10-CM | POA: Diagnosis not present

## 2022-01-16 DIAGNOSIS — Z79899 Other long term (current) drug therapy: Secondary | ICD-10-CM | POA: Diagnosis not present

## 2022-01-16 DIAGNOSIS — J449 Chronic obstructive pulmonary disease, unspecified: Secondary | ICD-10-CM | POA: Diagnosis not present

## 2022-01-16 DIAGNOSIS — I2510 Atherosclerotic heart disease of native coronary artery without angina pectoris: Secondary | ICD-10-CM | POA: Diagnosis not present

## 2022-01-16 LAB — CMP (CANCER CENTER ONLY)
ALT: 13 U/L (ref 0–44)
AST: 15 U/L (ref 15–41)
Albumin: 3.7 g/dL (ref 3.5–5.0)
Alkaline Phosphatase: 71 U/L (ref 38–126)
Anion gap: 5 (ref 5–15)
BUN: 16 mg/dL (ref 8–23)
CO2: 30 mmol/L (ref 22–32)
Calcium: 9.5 mg/dL (ref 8.9–10.3)
Chloride: 105 mmol/L (ref 98–111)
Creatinine: 0.96 mg/dL (ref 0.61–1.24)
GFR, Estimated: >60 mL/min (ref >60)
Glucose, Bld: 106 mg/dL — ABNORMAL HIGH (ref 70–99)
Potassium: 3.7 mmol/L (ref 3.5–5.1)
Sodium: 140 mmol/L (ref 135–145)
Total Bilirubin: 0.4 mg/dL (ref 0.3–1.2)
Total Protein: 7.1 g/dL (ref 6.5–8.1)

## 2022-01-16 LAB — CBC WITH DIFFERENTIAL (CANCER CENTER ONLY)
Abs Immature Granulocytes: 0.02 10*3/uL (ref 0.00–0.07)
Basophils Absolute: 0 10*3/uL (ref 0.0–0.1)
Basophils Relative: 1 %
Eosinophils Absolute: 0.3 10*3/uL (ref 0.0–0.5)
Eosinophils Relative: 4 %
HCT: 42.1 % (ref 39.0–52.0)
Hemoglobin: 14.4 g/dL (ref 13.0–17.0)
Immature Granulocytes: 0 %
Lymphocytes Relative: 21 %
Lymphs Abs: 1.4 10*3/uL (ref 0.7–4.0)
MCH: 28.3 pg (ref 26.0–34.0)
MCHC: 34.2 g/dL (ref 30.0–36.0)
MCV: 82.9 fL (ref 80.0–100.0)
Monocytes Absolute: 0.5 10*3/uL (ref 0.1–1.0)
Monocytes Relative: 7 %
Neutro Abs: 4.5 10*3/uL (ref 1.7–7.7)
Neutrophils Relative %: 67 %
Platelet Count: 353 10*3/uL (ref 150–400)
RBC: 5.08 MIL/uL (ref 4.22–5.81)
RDW: 16.6 % — ABNORMAL HIGH (ref 11.5–15.5)
WBC Count: 6.7 10*3/uL (ref 4.0–10.5)
nRBC: 0 % (ref 0.0–0.2)

## 2022-01-16 MED ORDER — SODIUM CHLORIDE 0.9 % IV SOLN
45.0000 mg/m2 | Freq: Once | INTRAVENOUS | Status: AC
  Administered 2022-01-16: 78 mg via INTRAVENOUS
  Filled 2022-01-16: qty 13

## 2022-01-16 MED ORDER — SODIUM CHLORIDE 0.9 % IV SOLN
10.0000 mg | Freq: Once | INTRAVENOUS | Status: AC
  Administered 2022-01-16: 10 mg via INTRAVENOUS
  Filled 2022-01-16: qty 10

## 2022-01-16 MED ORDER — FAMOTIDINE IN NACL 20-0.9 MG/50ML-% IV SOLN
20.0000 mg | Freq: Once | INTRAVENOUS | Status: AC
  Administered 2022-01-16: 20 mg via INTRAVENOUS
  Filled 2022-01-16: qty 50

## 2022-01-16 MED ORDER — SODIUM CHLORIDE 0.9 % IV SOLN
168.2000 mg | Freq: Once | INTRAVENOUS | Status: AC
  Administered 2022-01-16: 170 mg via INTRAVENOUS
  Filled 2022-01-16: qty 17

## 2022-01-16 MED ORDER — PALONOSETRON HCL INJECTION 0.25 MG/5ML
0.2500 mg | Freq: Once | INTRAVENOUS | Status: AC
  Administered 2022-01-16: 0.25 mg via INTRAVENOUS
  Filled 2022-01-16: qty 5

## 2022-01-16 MED ORDER — SODIUM CHLORIDE 0.9 % IV SOLN: Freq: Once | INTRAVENOUS | Status: AC

## 2022-01-16 MED ORDER — CETIRIZINE HCL 10 MG/ML IV SOLN
10.0000 mg | Freq: Once | INTRAVENOUS | Status: AC
  Administered 2022-01-16: 10 mg via INTRAVENOUS
  Filled 2022-01-16: qty 1

## 2022-01-16 NOTE — Patient Instructions (Signed)
[facility]  Discharge Instructions: Thank you for choosing [facility] to provide your oncology and hematology care.   If you have a lab appointment with the [facility], please go directly to the [facility] and check in at the registration area.   Wear comfortable clothing and clothing appropriate for easy access to any Portacath or PICC line.   We strive to give you quality time with your provider. You may need to reschedule your appointment if you arrive late (15 or more minutes).  Arriving late affects you and other patients whose appointments are after yours.  Also, if you miss three or more appointments without notifying the office, you may be dismissed from the clinic at the provider's discretion.      For prescription refill requests, have your pharmacy contact our office and allow 72 hours for refills to be completed.    Today you received the following chemotherapy and/or immunotherapy agents: Paclitaxel (Taxol) and Carboplatin.   To help prevent nausea and vomiting after your treatment, we encourage you to take your nausea medication as directed.  BELOW ARE SYMPTOMS THAT SHOULD BE REPORTED IMMEDIATELY: *FEVER GREATER THAN 100.4 F (38 C) OR HIGHER *CHILLS OR SWEATING *NAUSEA AND VOMITING THAT IS NOT CONTROLLED WITH YOUR NAUSEA MEDICATION *UNUSUAL SHORTNESS OF BREATH *UNUSUAL BRUISING OR BLEEDING *URINARY PROBLEMS (pain or burning when urinating, or frequent urination) *BOWEL PROBLEMS (unusual diarrhea, constipation, pain near the anus) TENDERNESS IN MOUTH AND THROAT WITH OR WITHOUT PRESENCE OF ULCERS (sore throat, sores in mouth, or a toothache) UNUSUAL RASH, SWELLING OR PAIN  UNUSUAL VAGINAL DISCHARGE OR ITCHING   Items with * indicate a potential emergency and should be followed up as soon as possible or go to the Emergency Department if any problems should occur.  Please show the CHEMOTHERAPY ALERT CARD or IMMUNOTHERAPY  ALERT CARD at check-in to the Emergency Department and triage nurse.  Should you have questions after your visit or need to cancel or reschedule your appointment, please contact [facility]  Dept: [phone]  and follow the prompts.  Office hours are 8:00 a.m. to 4:30 p.m. Monday - Friday. Please note that voicemails left after 4:00 p.m. may not be returned until the following business day.  We are closed weekends and major holidays. You have access to a nurse at all times for urgent questions. Please call the main number to the clinic Dept: [phone] and follow the prompts.   For any non-urgent questions, you may also contact your provider using MyChart. We now offer e-Visits for anyone 37 and older to request care online for non-urgent symptoms. For details visit [email or web address].   Also download the MyChart app! Go to the app store, search "MyChart", open the app, select [facility], and log in with your MyChart username and password.  Masks are optional in the cancer centers. If you would like for your care team to wear a mask while they are taking care of you, please let them know. You may have one support person who is at least 71 years old accompany you for your appointments. Paclitaxel Injection What is this medication? PACLITAXEL (PAK li TAX el) treats some types of cancer. It works by slowing down the growth of cancer cells. This medicine may be used for other purposes; ask your health care provider or pharmacist if you have questions. COMMON BRAND NAME(S): Onxol, Taxol What should I tell my care team before I take this medication?  They need to know if you have any of these conditions: Heart disease Liver disease Low white blood cell levels An unusual or allergic reaction to paclitaxel, other medications, foods, dyes, or preservatives If you or your partner are pregnant or trying to get pregnant Breast-feeding How should I use this  medication? This medication is injected into a vein. It is given by your care team in a hospital or clinic setting. Talk to your care team about the use of this medication in children. While it may be given to children for selected conditions, precautions do apply. Overdosage: If you think you have taken too much of this medicine contact a poison control center or emergency room at once. NOTE: This medicine is only for you. Do not share this medicine with others. What if I miss a dose? Keep appointments for follow-up doses. It is important not to miss your dose. Call your care team if you are unable to keep an appointment. What may interact with this medication? Do not take this medication with any of the following: Live virus vaccines Other medications may affect the way this medication works. Talk with your care team about all of the medications you take. They may suggest changes to your treatment plan to lower the risk of side effects and to make sure your medications work as intended. This list may not describe all possible interactions. Give your health care provider a list of all the medicines, herbs, non-prescription drugs, or dietary supplements you use. Also tell them if you smoke, drink alcohol, or use illegal drugs. Some items may interact with your medicine. What should I watch for while using this medication? Your condition will be monitored carefully while you are receiving this medication. You may need blood work while taking this medication. This medication may make you feel generally unwell. This is not uncommon as chemotherapy can affect healthy cells as well as cancer cells. Report any side effects. Continue your course of treatment even though you feel ill unless your care team tells you to stop. This medication can cause serious allergic reactions. To reduce the risk, your care team may give you other medications to take before receiving this one. Be sure to follow the directions  from your care team. This medication may increase your risk of getting an infection. Call your care team for advice if you get a fever, chills, sore throat, or other symptoms of a cold or flu. Do not treat yourself. Try to avoid being around people who are sick. This medication may increase your risk to bruise or bleed. Call your care team if you notice any unusual bleeding. Be careful brushing or flossing your teeth or using a toothpick because you may get an infection or bleed more easily. If you have any dental work done, tell your dentist you are receiving this medication. Talk to your care team if you may be pregnant. Serious birth defects can occur if you take this medication during pregnancy. Talk to your care team before breastfeeding. Changes to your treatment plan may be needed. What side effects may I notice from receiving this medication? Side effects that you should report to your care team as soon as possible: Allergic reactions--skin rash, itching, hives, swelling of the face, lips, tongue, or throat Heart rhythm changes--fast or irregular heartbeat, dizziness, feeling faint or lightheaded, chest pain, trouble breathing Increase in blood pressure Infection--fever, chills, cough, sore throat, wounds that don't heal, pain or trouble when passing urine, general feeling of discomfort  or being unwell Low blood pressure--dizziness, feeling faint or lightheaded, blurry vision Low red blood cell level--unusual weakness or fatigue, dizziness, headache, trouble breathing Painful swelling, warmth, or redness of the skin, blisters or sores at the infusion site Pain, tingling, or numbness in the hands or feet Slow heartbeat--dizziness, feeling faint or lightheaded, confusion, trouble breathing, unusual weakness or fatigue Unusual bruising or bleeding Side effects that usually do not require medical attention (report to your care team if they continue or are bothersome): Diarrhea Hair  loss Joint pain Loss of appetite Muscle pain Nausea Vomiting This list may not describe all possible side effects. Call your doctor for medical advice about side effects. You may report side effects to FDA at 1-800-FDA-1088. Where should I keep my medication? This medication is given in a hospital or clinic. It will not be stored at home. NOTE: This sheet is a summary. It may not cover all possible information. If you have questions about this medicine, talk to your doctor, pharmacist, or health care provider.  2023 Elsevier/Gold Standard (2021-09-29 00:00:00)  Carboplatin Injection What is this medication? CARBOPLATIN (KAR boe pla tin) treats some types of cancer. It works by slowing down the growth of cancer cells. This medicine may be used for other purposes; ask your health care provider or pharmacist if you have questions. COMMON BRAND NAME(S): Paraplatin What should I tell my care team before I take this medication? They need to know if you have any of these conditions: Blood disorders Hearing problems Kidney disease Recent or ongoing radiation therapy An unusual or allergic reaction to carboplatin, cisplatin, other medications, foods, dyes, or preservatives Pregnant or trying to get pregnant Breast-feeding How should I use this medication? This medication is injected into a vein. It is given by your care team in a hospital or clinic setting. Talk to your care team about the use of this medication in children. Special care may be needed. Overdosage: If you think you have taken too much of this medicine contact a poison control center or emergency room at once. NOTE: This medicine is only for you. Do not share this medicine with others. What if I miss a dose? Keep appointments for follow-up doses. It is important not to miss your dose. Call your care team if you are unable to keep an appointment. What may interact with this medication? Medications for seizures Some  antibiotics, such as amikacin, gentamicin, neomycin, streptomycin, tobramycin Vaccines This list may not describe all possible interactions. Give your health care provider a list of all the medicines, herbs, non-prescription drugs, or dietary supplements you use. Also tell them if you smoke, drink alcohol, or use illegal drugs. Some items may interact with your medicine. What should I watch for while using this medication? Your condition will be monitored carefully while you are receiving this medication. You may need blood work while taking this medication. This medication may make you feel generally unwell. This is not uncommon, as chemotherapy can affect healthy cells as well as cancer cells. Report any side effects. Continue your course of treatment even though you feel ill unless your care team tells you to stop. In some cases, you may be given additional medications to help with side effects. Follow all directions for their use. This medication may increase your risk of getting an infection. Call your care team for advice if you get a fever, chills, sore throat, or other symptoms of a cold or flu. Do not treat yourself. Try to avoid being around  people who are sick. Avoid taking medications that contain aspirin, acetaminophen, ibuprofen, naproxen, or ketoprofen unless instructed by your care team. These medications may hide a fever. Be careful brushing or flossing your teeth or using a toothpick because you may get an infection or bleed more easily. If you have any dental work done, tell your dentist you are receiving this medication. Talk to your care team if you wish to become pregnant or think you might be pregnant. This medication can cause serious birth defects. Talk to your care team about effective forms of contraception. Do not breast-feed while taking this medication. What side effects may I notice from receiving this medication? Side effects that you should report to your care team as  soon as possible: Allergic reactions--skin rash, itching, hives, swelling of the face, lips, tongue, or throat Infection--fever, chills, cough, sore throat, wounds that don't heal, pain or trouble when passing urine, general feeling of discomfort or being unwell Low red blood cell level--unusual weakness or fatigue, dizziness, headache, trouble breathing Pain, tingling, or numbness in the hands or feet, muscle weakness, change in vision, confusion or trouble speaking, loss of balance or coordination, trouble walking, seizures Unusual bruising or bleeding Side effects that usually do not require medical attention (report to your care team if they continue or are bothersome): Hair loss Nausea Unusual weakness or fatigue Vomiting This list may not describe all possible side effects. Call your doctor for medical advice about side effects. You may report side effects to FDA at 1-800-FDA-1088. Where should I keep my medication? This medication is given in a hospital or clinic. It will not be stored at home. NOTE: This sheet is a summary. It may not cover all possible information. If you have questions about this medicine, talk to your doctor, pharmacist, or health care provider.  2023 Elsevier/Gold Standard (2021-09-06 00:00:00)

## 2022-01-17 ENCOUNTER — Telehealth: Payer: Self-pay | Admitting: Medical Oncology

## 2022-01-17 ENCOUNTER — Ambulatory Visit: Payer: Self-pay

## 2022-01-17 ENCOUNTER — Telehealth: Payer: Self-pay | Admitting: *Deleted

## 2022-01-17 NOTE — Patient Instructions (Signed)
Visit Information  Thank you for taking time to visit with me today. Please don't hesitate to contact me if I can be of assistance to you.   Following are the goals we discussed today:   Goals Addressed             This Visit's Progress    Patient Stated: Continue to work with care coordinator to learn skills to manage my health conditions       Care Coordination Interventions: Evaluation of current treatment plan related to Lung cancer, heart failure, Type 2 diabetes and patient's adherence to plan as established by provider Reviewed medications with patient and discussed importance of compliance Reviewed scheduled/upcoming provider appointments  Discussed coumadin lab results and importance of having lab draws as recommended Discussed increased risk for bleeding and when to report signs to provider Patient advised to call and reschedule missed lab appointment Discussed why certain foods are not allowed when taking coumadin         Our next appointment is by telephone on 02/22/22 at 10:00 am  Please call the care guide team at [phone] if you need to cancel or reschedule your appointment.   If you are experiencing a Mental Health or [place] or need someone to talk to, please call the Suicide and Crisis Lifeline: 988 call 1-800-273-TALK (toll free, 24 hour hotline)  [name] RN,BSN,CCM [place] Coordinator [phone]

## 2022-01-17 NOTE — Telephone Encounter (Signed)
-----   Message from Lester Langlade, RN sent at 01/16/2022  4:04 PM EDT ----- Regarding: Dr. Julien Nordmann Pt. first time Taxol and Carboplatin. Tolerated well, no issues noted.

## 2022-01-17 NOTE — Telephone Encounter (Signed)
Called pt's mobile # & received vm & left message to call back.  Called pt's daughter/proxy/Jansae who didn't seem to know that her dad & treatment yesterday & she got him on the line & discussed with him.  She told him that she didn't know his cancer was back & that he had treatment yest & that he needed to tell her these things.  He denies any problems/concerns.  Instructed to call if any questions/concerns pop up & he expressed understanding.

## 2022-01-17 NOTE — Telephone Encounter (Signed)
Information  provided to dtr re diagnosis.

## 2022-01-17 NOTE — Patient Outreach (Signed)
  Care Coordination   Follow Up Visit Note   01/17/2022 Name: Jared Stevens MRN: [id number] DOB: 08/26/1950  Jared Stevens is a 71 y.o. year old male who sees [name], [name], NP for primary care. I spoke with  Jared Stevens by phone today  What matters to the patients health and wellness today?  Patient states he missed his coumadin lab draw and voiced concerned about not being able to eat certain foods he grew up on.    Goals Addressed             This Visit's Progress    Patient Stated: Continue to work with care coordinator to learn skills to manage my health conditions       Care Coordination Interventions: Evaluation of current treatment plan related to Lung cancer, heart failure, Type 2 diabetes and patient's adherence to plan as established by provider Reviewed medications with patient and discussed importance of compliance Reviewed scheduled/upcoming provider appointments  Discussed coumadin lab results and importance of having lab draws as recommended Discussed increased risk for bleeding and when to report signs to provider Patient advised to call and reschedule missed lab appointment Discussed why certain foods are not allowed when taking coumadin         SDOH assessments and interventions completed:  No     Care Coordination Interventions Activated:  Yes  Care Coordination Interventions:  Yes, provided   Follow up plan: Follow up call scheduled for 02/22/22 at 10:00 am    Encounter Outcome:  Pt. Scheduled   [name] RN,BSN,CCM RN Care Manager Coordinator [phone]

## 2022-01-18 ENCOUNTER — Other Ambulatory Visit: Payer: Self-pay

## 2022-01-18 ENCOUNTER — Other Ambulatory Visit: Payer: Self-pay | Admitting: Cardiovascular Disease

## 2022-01-18 MED ORDER — FUROSEMIDE 40 MG PO TABS: 40.0000 mg | ORAL_TABLET | Freq: Every day | ORAL | 1 refills | Status: DC

## 2022-01-19 ENCOUNTER — Ambulatory Visit: Payer: Medicare Other

## 2022-01-19 ENCOUNTER — Other Ambulatory Visit: Payer: Self-pay

## 2022-01-20 ENCOUNTER — Telehealth: Payer: Self-pay

## 2022-01-20 MED FILL — Dexamethasone Sodium Phosphate Inj 100 MG/10ML: INTRAMUSCULAR | Qty: 1 | Status: AC

## 2022-01-20 NOTE — Telephone Encounter (Signed)
CSW attempted to contact pt.  Left vm.

## 2022-01-23 ENCOUNTER — Inpatient Hospital Stay (HOSPITAL_BASED_OUTPATIENT_CLINIC_OR_DEPARTMENT_OTHER): Payer: Medicare Other | Admitting: Physician Assistant

## 2022-01-23 ENCOUNTER — Inpatient Hospital Stay: Payer: Medicare Other

## 2022-01-23 ENCOUNTER — Encounter: Payer: Self-pay | Admitting: Physician Assistant

## 2022-01-23 ENCOUNTER — Other Ambulatory Visit: Payer: Self-pay

## 2022-01-23 ENCOUNTER — Other Ambulatory Visit: Payer: Self-pay | Admitting: Physician Assistant

## 2022-01-23 VITALS — BP 99/66 | HR 79 | Temp 98.1°F | Resp 16 | Ht 73.0 in | Wt 134.4 lb

## 2022-01-23 DIAGNOSIS — C3491 Malignant neoplasm of unspecified part of right bronchus or lung: Secondary | ICD-10-CM

## 2022-01-23 DIAGNOSIS — Z5111 Encounter for antineoplastic chemotherapy: Secondary | ICD-10-CM | POA: Diagnosis not present

## 2022-01-23 DIAGNOSIS — Z79899 Other long term (current) drug therapy: Secondary | ICD-10-CM | POA: Diagnosis not present

## 2022-01-23 DIAGNOSIS — I2510 Atherosclerotic heart disease of native coronary artery without angina pectoris: Secondary | ICD-10-CM | POA: Diagnosis not present

## 2022-01-23 DIAGNOSIS — I5042 Chronic combined systolic (congestive) and diastolic (congestive) heart failure: Secondary | ICD-10-CM | POA: Diagnosis not present

## 2022-01-23 DIAGNOSIS — J449 Chronic obstructive pulmonary disease, unspecified: Secondary | ICD-10-CM | POA: Diagnosis not present

## 2022-01-23 DIAGNOSIS — R5383 Other fatigue: Secondary | ICD-10-CM | POA: Diagnosis not present

## 2022-01-23 DIAGNOSIS — R0602 Shortness of breath: Secondary | ICD-10-CM | POA: Diagnosis not present

## 2022-01-23 LAB — CBC WITH DIFFERENTIAL (CANCER CENTER ONLY)
Abs Immature Granulocytes: 0.04 10*3/uL (ref 0.00–0.07)
Basophils Absolute: 0.1 10*3/uL (ref 0.0–0.1)
Basophils Relative: 1 %
Eosinophils Absolute: 0.1 10*3/uL (ref 0.0–0.5)
Eosinophils Relative: 3 %
HCT: 38.6 % — ABNORMAL LOW (ref 39.0–52.0)
Hemoglobin: 13 g/dL (ref 13.0–17.0)
Immature Granulocytes: 1 %
Lymphocytes Relative: 26 %
Lymphs Abs: 1.3 10*3/uL (ref 0.7–4.0)
MCH: 28 pg (ref 26.0–34.0)
MCHC: 33.7 g/dL (ref 30.0–36.0)
MCV: 83 fL (ref 80.0–100.0)
Monocytes Absolute: 0.3 10*3/uL (ref 0.1–1.0)
Monocytes Relative: 7 %
Neutro Abs: 3.1 10*3/uL (ref 1.7–7.7)
Neutrophils Relative %: 62 %
Platelet Count: 298 10*3/uL (ref 150–400)
RBC: 4.65 MIL/uL (ref 4.22–5.81)
RDW: 16.9 % — ABNORMAL HIGH (ref 11.5–15.5)
WBC Count: 5 10*3/uL (ref 4.0–10.5)
nRBC: 0 % (ref 0.0–0.2)

## 2022-01-23 LAB — CMP (CANCER CENTER ONLY)
ALT: 16 U/L (ref 0–44)
AST: 15 U/L (ref 15–41)
Albumin: 3.5 g/dL (ref 3.5–5.0)
Alkaline Phosphatase: 79 U/L (ref 38–126)
Anion gap: 3 — ABNORMAL LOW (ref 5–15)
BUN: 18 mg/dL (ref 8–23)
CO2: 29 mmol/L (ref 22–32)
Calcium: 9.2 mg/dL (ref 8.9–10.3)
Chloride: 107 mmol/L (ref 98–111)
Creatinine: 0.79 mg/dL (ref 0.61–1.24)
GFR, Estimated: >60 mL/min (ref >60)
Glucose, Bld: 91 mg/dL (ref 70–99)
Potassium: 4 mmol/L (ref 3.5–5.1)
Sodium: 139 mmol/L (ref 135–145)
Total Bilirubin: 0.2 mg/dL — ABNORMAL LOW (ref 0.3–1.2)
Total Protein: 6.6 g/dL (ref 6.5–8.1)

## 2022-01-23 MED ORDER — PALONOSETRON HCL INJECTION 0.25 MG/5ML
0.2500 mg | Freq: Once | INTRAVENOUS | Status: AC
  Administered 2022-01-23: 0.25 mg via INTRAVENOUS
  Filled 2022-01-23: qty 5

## 2022-01-23 MED ORDER — SODIUM CHLORIDE 0.9 % IV SOLN
168.2000 mg | Freq: Once | INTRAVENOUS | Status: AC
  Administered 2022-01-23: 170 mg via INTRAVENOUS
  Filled 2022-01-23: qty 17

## 2022-01-23 MED ORDER — FAMOTIDINE IN NACL 20-0.9 MG/50ML-% IV SOLN
20.0000 mg | Freq: Once | INTRAVENOUS | Status: AC
  Administered 2022-01-23: 20 mg via INTRAVENOUS
  Filled 2022-01-23: qty 50

## 2022-01-23 MED ORDER — SODIUM CHLORIDE 0.9 % IV SOLN
10.0000 mg | Freq: Once | INTRAVENOUS | Status: AC
  Administered 2022-01-23: 10 mg via INTRAVENOUS
  Filled 2022-01-23: qty 10

## 2022-01-23 MED ORDER — CETIRIZINE HCL 10 MG/ML IV SOLN
10.0000 mg | Freq: Once | INTRAVENOUS | Status: AC
  Administered 2022-01-23: 10 mg via INTRAVENOUS
  Filled 2022-01-23: qty 1

## 2022-01-23 MED ORDER — SODIUM CHLORIDE 0.9 % IV SOLN
45.0000 mg/m2 | Freq: Once | INTRAVENOUS | Status: AC
  Administered 2022-01-23: 78 mg via INTRAVENOUS
  Filled 2022-01-23: qty 13

## 2022-01-23 MED ORDER — SODIUM CHLORIDE 0.9 % IV SOLN: Freq: Once | INTRAVENOUS | Status: AC

## 2022-01-23 NOTE — Progress Notes (Signed)
[facility] OFFICE PROGRESS NOTE  [name], [name], NP [place] [address]  DIAGNOSIS: Recurrent non-small cell lung cancer, adenocarcinoma that was initially diagnosed as stage IIIA non-small cell lung cancer, adenocarcinoma diagnosed in 2017  PRIOR THERAPY: status post a course of concurrent chemoradiation with weekly carboplatin and paclitaxel completed in early 2018 in [place]. The patient has been in observation since that time. The patient previously saw Dr. Krystal Clark   CURRENT THERAPY: Retreatment with concurrent chemoradiation with weekly carboplatin for AUC of 2 and paclitaxel 45 Mg/M2.  First dose January 16, 2022. Status post 1 cycle.   INTERVAL HISTORY: Jared Stevens 71 y.o. male returns to the clinic today for follow-up visit.  The patient was last seen by Dr. Julien Nordmann on 01/05/2022.  The patient was recently found to have recurrent lung cancer.  Dr. Julien Nordmann recommended treatment with concurrent chemoradiation.  The patient was referred to radiation oncology 01/05/2022.  I reach out to their office and they are going to follow-up to arrange for his consultation but they are in the process of trying to get records from [place] where he previously received radiation in 2017.  The patient underwent his first cycle of chemotherapy last week and he tolerated it well without any adverse side effects.  He denies any fever, chills, night sweats, or unexplained weight loss.  He reports his baseline dyspnea on exertion but states that it is not very significant.  He also reports that he may only cough every once in a while.  Denies any changes with cough.  He had some blood-tinged sputum after his bronchoscopy but that has since resolved.  Denies any chest pain.  Denies any nausea, vomiting, diarrhea, or constipation denies any headache or visual changes.  He is here today for evaluation and repeat blood work before undergoing cycle #2.    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  MEDICATIONS:  Current Outpatient Medications  Medication Sig Dispense Refill   albuterol (VENTOLIN HFA) 108 (90 Base) MCG/ACT inhaler Inhale 1-2 puffs into the lungs every 6 (six) hours as needed for wheezing or shortness of breath. Patient states he takes only as needed. 8 g 0   amitriptyline (ELAVIL) 50 MG tablet Take 50 mg by mouth at bedtime as needed for sleep.     budesonide-formoterol (SYMBICORT) 160-4.5 MCG/ACT inhaler INHALE TWO PUFFS BY MOUTH TWICE DAILY IN THE MORNING AND EVENING 10.2  g 0   carvedilol (COREG) 3.125 MG tablet TAKE 1 TABLET (3.125 MG TOTAL) BY MOUTH 2 (TWO) TIMES DAILY WITH A MEAL. 180 tablet 3   Cyanocobalamin (B-12) 2500 MCG SUBL Place 2,500 mcg under the tongue daily.     digoxin (LANOXIN) 0.125 MG tablet TAKE 1 TABLET (0.125 MG TOTAL) BY MOUTH DAILY. 90 tablet 3   finasteride (PROSCAR) 5 MG tablet Take 5 mg by mouth daily.     furosemide (LASIX) 40 MG tablet Take 1 tablet (40 mg total) by mouth daily. 90 tablet 1    latanoprost (XALATAN) 0.005 % ophthalmic solution Place 1 drop into both eyes daily.  11   rosuvastatin (CRESTOR) 40 MG tablet TAKE ONE TABLET BY MOUTH EVERY DAY 90 tablet 3   spironolactone (ALDACTONE) 25 MG tablet TAKE 0.5 TABLETS (12.5 MG TOTAL) BY MOUTH EVERY OTHER DAY. 45 tablet 1   tamsulosin (FLOMAX) 0.4 MG CAPS capsule Take 0.4 mg by mouth at bedtime.     warfarin (COUMADIN) 5 MG tablet Monday-Thursday take 1tab daily and Friday-Sunday take 1.5tab daily (Patient taking differently: Take 5-7.5 mg by mouth See admin instructions. Monday-Thursday take 5 mg daily and Fri, Sat and Sun take 7.5 mg daily) 34 tablet 5   prochlorperazine (COMPAZINE) 10 MG tablet Take 1 tablet (10 mg total) by mouth every 6 (six) hours as needed for nausea or vomiting. (Patient not taking: Reported on 01/23/2022) 30 tablet 0   No current facility-administered medications for this visit.   Facility-Administered Medications Ordered in Other Visits  Medication Dose Route Frequency Provider Last Rate Last Admin   CARBOplatin (PARAPLATIN) 170 mg in sodium chloride 0.9 % 100 mL chemo infusion  170 mg Intravenous Once Curt Bears, MD       PACLitaxel (TAXOL) 78 mg in sodium chloride 0.9 % 250 mL chemo infusion (</= 80mg /m2)  45 mg/m2 (Treatment Plan Recorded) Intravenous Once Curt Bears, MD        SURGICAL HISTORY:  Past Surgical History:  Procedure Laterality Date   Arm Surgery     BRONCHIAL BIOPSY  12/27/2021   Procedure: BRONCHIAL BIOPSIES;  Surgeon: Garner Nash, DO;  Location: [facility];  Service: Pulmonary;;   BRONCHIAL NEEDLE ASPIRATION BIOPSY  12/27/2021   Procedure: BRONCHIAL NEEDLE ASPIRATION BIOPSIES;  Surgeon: Garner Nash, DO;  Location: [facility];  Service: Pulmonary;;   HERNIA REPAIR     IR ANGIO INTRA EXTRACRAN SEL COM CAROTID INNOMINATE UNI L MOD SED  10/21/2018   IR ANGIO VERTEBRAL SEL SUBCLAVIAN INNOMINATE UNI R MOD SED  10/21/2018   IR CT HEAD LTD  10/21/2018   IR PERCUTANEOUS  ART THROMBECTOMY/INFUSION INTRACRANIAL INC DIAG ANGIO  10/21/2018   IR THORACENTESIS ASP PLEURAL SPACE W/IMG GUIDE  09/27/2018   RADIOLOGY WITH ANESTHESIA N/A 10/21/2018   Procedure: RADIOLOGY WITH ANESTHESIA;  Surgeon: Luanne Bras, MD;  Location: [facility];  Service: Radiology;  Laterality: N/A;   REPLANTATION THUMB     RIGHT/LEFT HEART CATH AND CORONARY ANGIOGRAPHY N/A 10/01/2018   Procedure: RIGHT/LEFT HEART CATH AND CORONARY ANGIOGRAPHY;  Surgeon: Troy Sine, MD;  Location: [facility];  Service: Cardiovascular;  Laterality: N/A;    REVIEW OF SYSTEMS:   Review of Systems  Constitutional: Negative for appetite change, chills, fatigue, fever and unexpected weight change.  HENT: Negative for mouth sores, nosebleeds, sore throat and trouble swallowing.   Eyes: Negative for eye problems and icterus.  Respiratory: Positive for mild dyspnea and cough. Negative for hemoptysis and  wheezing.   Cardiovascular: Negative for chest pain and leg swelling.  Gastrointestinal: Negative for abdominal pain, constipation, diarrhea, nausea and vomiting.  Genitourinary: Negative for bladder incontinence, difficulty urinating, dysuria, frequency and hematuria.   Musculoskeletal: Negative for back pain, gait problem, neck pain and neck stiffness.  Skin: Negative for itching and rash.  Neurological: Negative for dizziness, extremity weakness, gait problem, headaches, light-headedness and seizures.  Hematological: Negative for adenopathy. Does not bruise/bleed easily.  Psychiatric/Behavioral: Negative for confusion, depression and sleep disturbance. The patient is not nervous/anxious.     PHYSICAL EXAMINATION:  Blood pressure 99/66, pulse 79, temperature 98.1 F (36.7 C), temperature source Tympanic, resp. rate 16, height 6\' 1"  (1.854 m), weight 134 lb 6.4 oz (61 kg), SpO2 97 %.  ECOG PERFORMANCE STATUS: 1  Physical Exam  Constitutional: Oriented to person, place, and time and thin appearing male  and in no distress.  HENT:  Head: Normocephalic and atraumatic.  Mouth/Throat: Oropharynx is clear and moist. No oropharyngeal exudate.  Eyes: Conjunctivae are normal. Right eye exhibits no discharge. Left eye exhibits no discharge. No scleral icterus.  Neck: Normal range of motion. Neck supple.  Cardiovascular: Normal rate, regular rhythm, normal heart sounds and intact distal pulses.   Pulmonary/Chest: Effort normal and breath sounds normal. No respiratory distress. No wheezes. No rales.  Abdominal: Soft. Bowel sounds are normal. Exhibits no distension and no mass. There is no tenderness.  Musculoskeletal: Normal range of motion. Exhibits no edema.  Lymphadenopathy:    No cervical adenopathy.  Neurological: Alert and oriented to person, place, and time.  Exhibits muscle wasting.  Gait normal. Coordination normal.  Positive for ambulates with a cane. Skin: Skin is warm and dry. No rash noted. Not diaphoretic. No erythema. No pallor.  Psychiatric: Mood, memory and judgment normal.  Vitals reviewed.  LABORATORY DATA: Lab Results  Component Value Date   WBC 5.0 01/23/2022   HGB 13.0 01/23/2022   HCT 38.6 (L) 01/23/2022   MCV 83.0 01/23/2022   PLT 298 01/23/2022      Chemistry      Component Value Date/Time   NA 139 01/23/2022 1012   NA 138 06/01/2021 1239   K 4.0 01/23/2022 1012   CL 107 01/23/2022 1012   CO2 29 01/23/2022 1012   BUN 18 01/23/2022 1012   BUN 17 06/01/2021 1239   CREATININE 0.79 01/23/2022 1012      Component Value Date/Time   CALCIUM 9.2 01/23/2022 1012   ALKPHOS 79 01/23/2022 1012   AST 15 01/23/2022 1012   ALT 16 01/23/2022 1012   BILITOT 0.2 (L) 01/23/2022 1012       RADIOGRAPHIC STUDIES:  DG Chest Port 1 View  Result Date: 12/27/2021 CLINICAL DATA:  Status post bronchoscopy with biopsy EXAM: PORTABLE CHEST 1 VIEW COMPARISON:  Radiograph 10/24/2018, CT 11/07/2021 FINDINGS: Unchanged rightward mediastinal shift. Similar right perihilar opacity and  small right pleural effusion. No pneumothorax. Left lung is clear. No acute osseous abnormality. IMPRESSION: Similar right perihilar opacity in small right pleural effusion. No pneumothorax. Electronically Signed   By: Maurine Simmering M.D.   On: 12/27/2021 13:51   DG C-ARM BRONCHOSCOPY  Result Date: 12/27/2021 C-ARM BRONCHOSCOPY: Fluoroscopy was utilized by the requesting physician.  No radiographic interpretation.     ASSESSMENT/PLAN:  This is a very pleasant 71 year old African-American male with recurrent non-small cell lung cancer, adenocarcinoma.  He was diagnosed with recurrence in August 2023.  The patient has a history of stage IIIa non-small cell lung cancer,  adenocarcinoma.  He is status post course of concurrent chemoradiation in [place] in 2017.  He states he previously saw Dr. Krystal Clark from radiation oncology in [place].   In the summer 2023, the patient's imaging showed progressive soft tissue density in the right upper lobe medially adjacent to the aortic arch and SVC worrisome for possible neoplastic process.  Therefore the patient had a PET scan performed that showed progressive soft tissue density in the medial right upper lobe adjacent to the ascending aorta and SVC on recent CT scan is markedly hypermetabolic consistent with disease recurrence.  There is low-level left DG accumulation in the subcarinal station that may reflect hypermetabolic nodal metastasis but no evidence of hypermetabolic metastatic disease in the neck, abdomen, or pelvis.  The patient had a repeat bronchoscopy by Dr. Valeta Harms and the final pathology was consistent with recurrent adenocarcinoma of the lung.  Therefore the patient's case was discussed at the weekly Thoracic conference and the recommendation is to proceed with a course of chemoradiation.  The patient is currently undergoing carboplatin for an AUC of 2 and paclitaxel 45 mg per metered square.  He is status post 1 cycle which was given on 01/16/2022.  The  patient tolerated this well without any concerning adverse side effects.  I have reached out to radiation oncology.  They are in the process of trying to obtain his prior radiation records from [place].  They are going to follow-up with the patient and schedule consultation soon.  I discussed the patient's situation with Dr. Julien Nordmann.  Dr. Julien Nordmann recommends that he proceed with cycle #2 today as scheduled.   He is using transportation services.   The patient was advised to call immediately if he has any concerning symptoms in the interval. The patient voices understanding of current disease status and treatment options and is in agreement with the current care plan. All questions were answered. The patient knows to call the clinic with any problems, questions or concerns. We can certainly see the patient much sooner if necessary      No orders of the defined types were placed in this encounter.    The total time spent in the appointment was 20-29 minutes.   Gerrard Crystal L Laverta Harnisch, PA-C 01/23/22

## 2022-01-23 NOTE — Progress Notes (Signed)
[facility] CSW Progress Note  Holiday representative met with patient to have him sign [employer] [place] application for bus transportation to treatment.  Patient appeared to be relaxing during infusion and expressed no discomfort.  CSW explained the [employer] [place] application and he agreed.  He stated [name], the Solicitor, was currently assisting him with transportation.  Patient stated he had not received a call from the [facility] about volunteer transportation.  Patient expressed no other needs.  Rodman Pickle Jazon Jipson, LCSW

## 2022-01-23 NOTE — Patient Instructions (Signed)
[facility] ONCOLOGY  Discharge Instructions: Thank you for choosing [facility] to provide your oncology and hematology care.   If you have a lab appointment with the [facility], please go directly to the [facility] and check in at the registration area.   Wear comfortable clothing and clothing appropriate for easy access to any Portacath or PICC line.   We strive to give you quality time with your provider. You may need to reschedule your appointment if you arrive late (15 or more minutes).  Arriving late affects you and other patients whose appointments are after yours.  Also, if you miss three or more appointments without notifying the office, you may be dismissed from the clinic at the provider's discretion.      For prescription refill requests, have your pharmacy contact our office and allow 72 hours for refills to be completed.    Today you received the following chemotherapy and/or immunotherapy agents: carboplatin, paclitaxel      To help prevent nausea and vomiting after your treatment, we encourage you to take your nausea medication as directed.  BELOW ARE SYMPTOMS THAT SHOULD BE REPORTED IMMEDIATELY: *FEVER GREATER THAN 100.4 F (38 C) OR HIGHER *CHILLS OR SWEATING *NAUSEA AND VOMITING THAT IS NOT CONTROLLED WITH YOUR NAUSEA MEDICATION *UNUSUAL SHORTNESS OF BREATH *UNUSUAL BRUISING OR BLEEDING *URINARY PROBLEMS (pain or burning when urinating, or frequent urination) *BOWEL PROBLEMS (unusual diarrhea, constipation, pain near the anus) TENDERNESS IN MOUTH AND THROAT WITH OR WITHOUT PRESENCE OF ULCERS (sore throat, sores in mouth, or a toothache) UNUSUAL RASH, SWELLING OR PAIN  UNUSUAL VAGINAL DISCHARGE OR ITCHING   Items with * indicate a potential emergency and should be followed up as soon as possible or go to the Emergency Department if any problems should occur.  Please show the CHEMOTHERAPY ALERT CARD or IMMUNOTHERAPY ALERT CARD  at check-in to the Emergency Department and triage nurse.  Should you have questions after your visit or need to cancel or reschedule your appointment, please contact [facility]  Dept: [phone]  and follow the prompts.  Office hours are 8:00 a.m. to 4:30 p.m. Monday - Friday. Please note that voicemails left after 4:00 p.m. may not be returned until the following business day.  We are closed weekends and major holidays. You have access to a nurse at all times for urgent questions. Please call the main number to the clinic Dept: [phone] and follow the prompts.   For any non-urgent questions, you may also contact your provider using MyChart. We now offer e-Visits for anyone 62 and older to request care online for non-urgent symptoms. For details visit [email or web address].   Also download the MyChart app! Go to the app store, search "MyChart", open the app, select [facility], and log in with your MyChart username and password.  Masks are optional in the cancer centers. If you would like for your care team to wear a mask while they are taking care of you, please let them know. You may have one support person who is at least 71 years old accompany you for your appointments.

## 2022-01-26 ENCOUNTER — Ambulatory Visit: Payer: Medicare Other

## 2022-01-26 ENCOUNTER — Other Ambulatory Visit: Payer: Self-pay | Admitting: Nurse Practitioner

## 2022-01-26 ENCOUNTER — Telehealth: Payer: Self-pay | Admitting: Radiation Oncology

## 2022-01-26 DIAGNOSIS — J449 Chronic obstructive pulmonary disease, unspecified: Secondary | ICD-10-CM

## 2022-01-26 NOTE — Telephone Encounter (Signed)
Sent e-mail to [facility] 8/24 requesting for all Rad Onc records to be faxed to [place], no response. Sent e-mail 8/31 for a update on records being sent to our facility.

## 2022-01-27 MED FILL — Dexamethasone Sodium Phosphate Inj 100 MG/10ML: INTRAMUSCULAR | Qty: 1 | Status: AC

## 2022-01-31 ENCOUNTER — Inpatient Hospital Stay: Payer: Medicare Other

## 2022-01-31 ENCOUNTER — Other Ambulatory Visit: Payer: Self-pay | Admitting: Nurse Practitioner

## 2022-01-31 ENCOUNTER — Inpatient Hospital Stay: Payer: Medicare Other | Attending: Internal Medicine

## 2022-01-31 ENCOUNTER — Other Ambulatory Visit: Payer: Self-pay

## 2022-01-31 VITALS — BP 104/72 | HR 85 | Temp 97.7°F | Resp 16 | Ht 73.0 in | Wt 131.8 lb

## 2022-01-31 DIAGNOSIS — Z923 Personal history of irradiation: Secondary | ICD-10-CM | POA: Diagnosis not present

## 2022-01-31 DIAGNOSIS — Z7901 Long term (current) use of anticoagulants: Secondary | ICD-10-CM

## 2022-01-31 DIAGNOSIS — E46 Unspecified protein-calorie malnutrition: Secondary | ICD-10-CM | POA: Insufficient documentation

## 2022-01-31 DIAGNOSIS — Z5111 Encounter for antineoplastic chemotherapy: Secondary | ICD-10-CM | POA: Insufficient documentation

## 2022-01-31 DIAGNOSIS — C3491 Malignant neoplasm of unspecified part of right bronchus or lung: Secondary | ICD-10-CM

## 2022-01-31 LAB — CBC WITH DIFFERENTIAL (CANCER CENTER ONLY)
Abs Immature Granulocytes: 0.03 10*3/uL (ref 0.00–0.07)
Basophils Absolute: 0 10*3/uL (ref 0.0–0.1)
Basophils Relative: 1 %
Eosinophils Absolute: 0.1 10*3/uL (ref 0.0–0.5)
Eosinophils Relative: 1 %
HCT: 37.1 % — ABNORMAL LOW (ref 39.0–52.0)
Hemoglobin: 12.7 g/dL — ABNORMAL LOW (ref 13.0–17.0)
Immature Granulocytes: 1 %
Lymphocytes Relative: 26 %
Lymphs Abs: 1.5 10*3/uL (ref 0.7–4.0)
MCH: 28.3 pg (ref 26.0–34.0)
MCHC: 34.2 g/dL (ref 30.0–36.0)
MCV: 82.6 fL (ref 80.0–100.0)
Monocytes Absolute: 0.5 10*3/uL (ref 0.1–1.0)
Monocytes Relative: 9 %
Neutro Abs: 3.5 10*3/uL (ref 1.7–7.7)
Neutrophils Relative %: 62 %
Platelet Count: 332 10*3/uL (ref 150–400)
RBC: 4.49 MIL/uL (ref 4.22–5.81)
RDW: 17.2 % — ABNORMAL HIGH (ref 11.5–15.5)
WBC Count: 5.6 10*3/uL (ref 4.0–10.5)
nRBC: 0 % (ref 0.0–0.2)

## 2022-01-31 LAB — CMP (CANCER CENTER ONLY)
ALT: 14 U/L (ref 0–44)
AST: 14 U/L — ABNORMAL LOW (ref 15–41)
Albumin: 3.7 g/dL (ref 3.5–5.0)
Alkaline Phosphatase: 70 U/L (ref 38–126)
Anion gap: 3 — ABNORMAL LOW (ref 5–15)
BUN: 23 mg/dL (ref 8–23)
CO2: 30 mmol/L (ref 22–32)
Calcium: 9.4 mg/dL (ref 8.9–10.3)
Chloride: 105 mmol/L (ref 98–111)
Creatinine: 0.93 mg/dL (ref 0.61–1.24)
GFR, Estimated: >60 mL/min (ref >60)
Glucose, Bld: 109 mg/dL — ABNORMAL HIGH (ref 70–99)
Potassium: 3.6 mmol/L (ref 3.5–5.1)
Sodium: 138 mmol/L (ref 135–145)
Total Bilirubin: 0.3 mg/dL (ref 0.3–1.2)
Total Protein: 7.3 g/dL (ref 6.5–8.1)

## 2022-01-31 MED ORDER — PALONOSETRON HCL INJECTION 0.25 MG/5ML
0.2500 mg | Freq: Once | INTRAVENOUS | Status: AC
  Administered 2022-01-31: 0.25 mg via INTRAVENOUS
  Filled 2022-01-31: qty 5

## 2022-01-31 MED ORDER — CETIRIZINE HCL 10 MG/ML IV SOLN
10.0000 mg | Freq: Once | INTRAVENOUS | Status: AC
  Administered 2022-01-31: 10 mg via INTRAVENOUS
  Filled 2022-01-31: qty 1

## 2022-01-31 MED ORDER — SODIUM CHLORIDE 0.9 % IV SOLN
45.0000 mg/m2 | Freq: Once | INTRAVENOUS | Status: AC
  Administered 2022-01-31: 78 mg via INTRAVENOUS
  Filled 2022-01-31: qty 13

## 2022-01-31 MED ORDER — FAMOTIDINE IN NACL 20-0.9 MG/50ML-% IV SOLN
20.0000 mg | Freq: Once | INTRAVENOUS | Status: AC
  Administered 2022-01-31: 20 mg via INTRAVENOUS
  Filled 2022-01-31: qty 50

## 2022-01-31 MED ORDER — SODIUM CHLORIDE 0.9 % IV SOLN
10.0000 mg | Freq: Once | INTRAVENOUS | Status: AC
  Administered 2022-01-31: 10 mg via INTRAVENOUS
  Filled 2022-01-31: qty 10

## 2022-01-31 MED ORDER — SODIUM CHLORIDE 0.9 % IV SOLN
168.2000 mg | Freq: Once | INTRAVENOUS | Status: AC
  Administered 2022-01-31: 170 mg via INTRAVENOUS
  Filled 2022-01-31: qty 17

## 2022-01-31 MED ORDER — SODIUM CHLORIDE 0.9 % IV SOLN: Freq: Once | INTRAVENOUS | Status: AC

## 2022-01-31 NOTE — Telephone Encounter (Signed)
Chart supports Rx Last OV: 10/2021 Next OV: 01/2022

## 2022-01-31 NOTE — Patient Instructions (Signed)
[facility] ONCOLOGY   Discharge Instructions: Thank you for choosing [facility] to provide your oncology and hematology care.   If you have a lab appointment with the [facility], please go directly to the [facility] and check in at the registration area.   Wear comfortable clothing and clothing appropriate for easy access to any Portacath or PICC line.   We strive to give you quality time with your provider. You may need to reschedule your appointment if you arrive late (15 or more minutes).  Arriving late affects you and other patients whose appointments are after yours.  Also, if you miss three or more appointments without notifying the office, you may be dismissed from the clinic at the provider's discretion.      For prescription refill requests, have your pharmacy contact our office and allow 72 hours for refills to be completed.    Today you received the following chemotherapy and/or immunotherapy agents: Paclitaxel (Taxol) and Carboplatin       To help prevent nausea and vomiting after your treatment, we encourage you to take your nausea medication as directed.  BELOW ARE SYMPTOMS THAT SHOULD BE REPORTED IMMEDIATELY: *FEVER GREATER THAN 100.4 F (38 C) OR HIGHER *CHILLS OR SWEATING *NAUSEA AND VOMITING THAT IS NOT CONTROLLED WITH YOUR NAUSEA MEDICATION *UNUSUAL SHORTNESS OF BREATH *UNUSUAL BRUISING OR BLEEDING *URINARY PROBLEMS (pain or burning when urinating, or frequent urination) *BOWEL PROBLEMS (unusual diarrhea, constipation, pain near the anus) TENDERNESS IN MOUTH AND THROAT WITH OR WITHOUT PRESENCE OF ULCERS (sore throat, sores in mouth, or a toothache) UNUSUAL RASH, SWELLING OR PAIN  UNUSUAL VAGINAL DISCHARGE OR ITCHING   Items with * indicate a potential emergency and should be followed up as soon as possible or go to the Emergency Department if any problems should occur.  Please show the CHEMOTHERAPY ALERT CARD or  IMMUNOTHERAPY ALERT CARD at check-in to the Emergency Department and triage nurse.  Should you have questions after your visit or need to cancel or reschedule your appointment, please contact [facility]  Dept: [phone]  and follow the prompts.  Office hours are 8:00 a.m. to 4:30 p.m. Monday - Friday. Please note that voicemails left after 4:00 p.m. may not be returned until the following business day.  We are closed weekends and major holidays. You have access to a nurse at all times for urgent questions. Please call the main number to the clinic Dept: [phone] and follow the prompts.   For any non-urgent questions, you may also contact your provider using MyChart. We now offer e-Visits for anyone 24 and older to request care online for non-urgent symptoms. For details visit [email or web address].   Also download the MyChart app! Go to the app store, search "MyChart", open the app, select [facility], and log in with your MyChart username and password.  Masks are optional in the cancer centers. If you would like for your care team to wear a mask while they are taking care of you, please let them know. You may have one support person who is at least 71 years old accompany you for your appointments.

## 2022-02-02 ENCOUNTER — Ambulatory Visit: Payer: Medicare Other

## 2022-02-02 DIAGNOSIS — H401231 Low-tension glaucoma, bilateral, mild stage: Secondary | ICD-10-CM | POA: Diagnosis not present

## 2022-02-02 DIAGNOSIS — H2513 Age-related nuclear cataract, bilateral: Secondary | ICD-10-CM | POA: Diagnosis not present

## 2022-02-03 ENCOUNTER — Telehealth: Payer: Self-pay | Admitting: Radiation Oncology

## 2022-02-03 NOTE — Telephone Encounter (Signed)
Called patient to schedule a consultation w. Dr Lisbeth Renshaw, and to inform patient about signing a Medical Record Release Form. No answer, LVM for a return call.

## 2022-02-06 ENCOUNTER — Other Ambulatory Visit: Payer: Medicare Other

## 2022-02-06 ENCOUNTER — Ambulatory Visit: Payer: Medicare Other

## 2022-02-06 ENCOUNTER — Inpatient Hospital Stay: Payer: Medicare Other

## 2022-02-06 MED FILL — Dexamethasone Sodium Phosphate Inj 100 MG/10ML: INTRAMUSCULAR | Qty: 1 | Status: AC

## 2022-02-06 NOTE — Progress Notes (Signed)
Thoracic Location of Tumor / Histology: Lung Cancer RUL- recurrent Non-Small Cell-Adenocarcinoma   History: The patient has a history of stage IIIa non-small cell lung carcinoma, adenocarcinoma of the RUL diagnosed in 2017 when he was living in [place].  He received chemoradiation which was finished in early 2018.  He had been in observation.  Prior Therapy: status post a course of concurrent chemoradiation with weekly carboplatin and paclitaxel completed in early 2018 in [place].   Bronchoscopy 12/27/2021:   PET 8/0/3212: Hypermetabolic uptake in the right upper lobe with focal hypermetabolic change in the right hilum, no other evidence of distant metastatic disease was identified.  CT Chest 11/07/2021: Progressive soft tissue density in the right upper lobe medially adjacent to the aortic arch and SVC worrisome for possible recurrent disease   Biopsies of RUL Lung Mass 12/27/2021    Tobacco/Marijuana/Snuff/ETOH use: Recently quit, 09/26/2021  Past/Anticipated interventions by cardiothoracic surgery, if any:    Past/Anticipated interventions by medical oncology, if any:  Dr. Julien Nordmann [name] 01/23/2022 -Chemotherapy started 01/16/2022 -carboplatin for an AUC of 2 and paclitaxel 45 mg per metered square.    Signs/Symptoms Weight changes, if any: Fluctuates  Respiratory complaints, if any: No Hemoptysis, if any: No, occasional dry cough. Pain issues, if any:  No  SAFETY ISSUES: Prior radiation? Right Chest- 2017-2018 [place] Pacemaker/ICD?  No Possible current pregnancy? N/a Is the patient on methotrexate? No  Current Complaints / other details:

## 2022-02-06 NOTE — Progress Notes (Signed)
Radiation Oncology         [phone] ________________________________  Name: Jared Stevens        MRN: [id number]  Date of Service: 02/07/2022 DOB: 24-Dec-1950  Stevens, Jared Brooke, NP  Curt Bears, MD     REFERRING PHYSICIAN: Curt Bears, MD   DIAGNOSIS: The primary encounter diagnosis was Non-small cell lung cancer, right ([place]). Diagnoses of History of lung cancer and Malignant neoplasm of right upper lobe of lung ([place]) were also pertinent to this visit.   HISTORY OF PRESENT ILLNESS: Jared Stevens is a 71 y.o. male seen at the request of Dr. Julien Nordmann for a diagnosis of lung cancer.  The patient has a history of Stage IIIa non-small cell lung carcinoma, adenocarcinoma of the RUL diagnosed in 2017 when he was living in [place].  He received chemoradiation which was finished in early 2018.  He had been in observation.  He relocated to [facility] and has been established with Dr. Julien Nordmann since August 2020.  Imaging in May 2022 with a CT without contrast showed stable changes in the right perihilar lung without evidence of recurrence.  He had a CT chest with contrast on 11/07/2021 again for surveillance however this scan showed progressive soft tissue density in the right upper lobe medially adjacent to the aorta aortic arch and SVC worrisome for possible recurrent disease.  A PET scan on 11/30/2021 showed hypermetabolic uptake in the right upper lobe with focal hypermetabolic change in the right hilum no other evidence of distant metastatic disease was identified.  He underwent bronchoscopy on 12/27/2021 and final pathology showed adenocarcinoma in the right upper lobe fine-needle aspirate and biopsy.  He was counseled on the rationale for retreatment with chemoradiation.  He began this on 01/05/2022.  Our department has had significant difficulty in obtaining his prior treatment records from [facility] in [place] as the hospital is out of business.  [facility] is resumed care of  his patients however his records are in storage and we have been waiting for several weeks to have these updated.  He is seen to discuss chemoradiation.    PREVIOUS RADIATION THERAPY: Yes   2017-2018, the patient was treated to the RUL and regional nodes of the chest with chemoRT in [place]. Records are not available, but we suspecte he received about 6 1/2 weeks of therapy, likely about 60-66 Gy.   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   Tx with tPA and thrombectomy // Coumadin (managed by PCP)   Hx of recurrent R pleural effusion    s/p thoracentesis   Hypercholesteremia    Long term (current) use of anticoagulants 01/08/2020   Lung cancer ([place]) 03/2016   Mixed Ischemic and Non-Ischemic Cardiomyopathy    Non-small cell carcinoma of lung    Nonsustained ventricular tachycardia        PAST SURGICAL HISTORY: Past  Surgical History:  Procedure Laterality Date   Arm Surgery     BRONCHIAL BIOPSY  12/27/2021   Procedure: BRONCHIAL BIOPSIES;  Surgeon: Garner Nash, DO;  Location: [facility];  Service: Pulmonary;;   BRONCHIAL NEEDLE ASPIRATION BIOPSY  12/27/2021   Procedure: BRONCHIAL NEEDLE ASPIRATION BIOPSIES;  Surgeon: Garner Nash, DO;  Location: [facility];  Service: Pulmonary;;   HERNIA REPAIR     IR ANGIO INTRA EXTRACRAN SEL COM CAROTID INNOMINATE UNI L MOD SED  10/21/2018   IR ANGIO VERTEBRAL SEL SUBCLAVIAN INNOMINATE UNI R MOD SED  10/21/2018    IR CT HEAD LTD  10/21/2018   IR PERCUTANEOUS ART THROMBECTOMY/INFUSION INTRACRANIAL INC DIAG ANGIO  10/21/2018   IR THORACENTESIS ASP PLEURAL SPACE W/IMG GUIDE  09/27/2018   RADIOLOGY WITH ANESTHESIA N/A 10/21/2018   Procedure: RADIOLOGY WITH ANESTHESIA;  Surgeon: Luanne Bras, MD;  Location: [facility];  Service: Radiology;  Laterality: N/A;   REPLANTATION THUMB     RIGHT/LEFT HEART CATH AND CORONARY ANGIOGRAPHY N/A 10/01/2018   Procedure: RIGHT/LEFT HEART CATH AND CORONARY ANGIOGRAPHY;  Surgeon: Troy Sine, MD;  Location: [facility];  Service: Cardiovascular;  Laterality: N/A;     FAMILY HISTORY:  Family History  Problem Relation Age of Onset   Aneurysm Mother    Colon polyps Neg Hx    Colon cancer Neg Hx      SOCIAL HISTORY:  reports that he quit smoking about 4 months ago. His smoking use included cigarettes. He started smoking about 54 years ago. He has a 41.25 pack-year smoking history. He has never used smokeless tobacco. He reports that he does not currently use alcohol after a past usage of about 2.0 standard drinks of alcohol per week. He reports that he does not currently use drugs. The patient is widowed and lives in [place]. He moved to [place] to be closer to his daughter. He worked as an Doctor, general practice.   ALLERGIES: Patient has no known allergies.   MEDICATIONS:  Current Outpatient Medications  Medication Sig Dispense Refill   albuterol (VENTOLIN HFA) 108 (90 Base) MCG/ACT inhaler Inhale 1-2 puffs into the lungs every 6 (six) hours as needed for wheezing or shortness of breath. Patient states he takes only as needed. 8 g 0   amitriptyline (ELAVIL) 50 MG tablet Take 50 mg by mouth at bedtime as needed for sleep.     budesonide-formoterol (SYMBICORT) 160-4.5 MCG/ACT inhaler INHALE TWO PUFFS INTO THE LUNGS TWICE DAILY (IN THE MORNING AND IN THE EVENING) 10.2 g 0   carvedilol (COREG) 3.125 MG tablet TAKE 1 TABLET (3.125 MG TOTAL) BY MOUTH 2 (TWO) TIMES DAILY WITH  A MEAL. 180 tablet 3   Cyanocobalamin (B-12) 2500 MCG SUBL Place 2,500 mcg under the tongue daily.     digoxin (LANOXIN) 0.125 MG tablet TAKE 1 TABLET (0.125 MG TOTAL) BY MOUTH DAILY. 90 tablet 3   finasteride (PROSCAR) 5 MG tablet Take 5 mg by mouth daily.     furosemide (LASIX) 40 MG tablet Take 1 tablet (40 mg total) by mouth daily. 90 tablet 1   latanoprost (XALATAN) 0.005 % ophthalmic solution Place 1 drop into both eyes daily.  11   prochlorperazine (COMPAZINE) 10 MG tablet Take 1 tablet (10 mg total) by mouth every 6 (six) hours as needed for nausea or vomiting. 30 tablet 0   rosuvastatin (CRESTOR) 40 MG tablet TAKE ONE TABLET BY MOUTH EVERY DAY 90 tablet 3   spironolactone (ALDACTONE) 25 MG tablet  TAKE 0.5 TABLETS (12.5 MG TOTAL) BY MOUTH EVERY OTHER DAY. 45 tablet 1   tamsulosin (FLOMAX) 0.4 MG CAPS capsule Take 0.4 mg by mouth at bedtime.     warfarin (COUMADIN) 5 MG tablet MONDAY - THURSDAY TAKE ONE TAB DAILY AND FRIDAY - SUNDAY TAKE 1.5 TABS DAILY 45 tablet 0   No current facility-administered medications for this encounter.     REVIEW OF SYSTEMS: On review of systems, the patient reports that  he is doing pretty well overall. He reports an occasional dry cough and his weight fluctuates somewhat per report. He denies any hemoptysis or fevers. No pain is noted, and no other complaints are verbalized.      PHYSICAL EXAM:  Wt Readings from Last 3 Encounters:  02/07/22 136 lb 6.4 oz (61.9 kg)  02/07/22 136 lb 4 oz (61.8 kg)  02/07/22 134 lb 8 oz (61 kg)   Temp Readings from Last 3 Encounters:  02/07/22 97.8 F (36.6 C)  02/07/22 97.8 F (36.6 C) (Oral)  02/07/22 97.9 F (36.6 C) (Temporal)   BP Readings from Last 3 Encounters:  02/07/22 99/67  02/07/22 99/67  02/07/22 95/76   Pulse Readings from Last 3 Encounters:  02/07/22 87  02/07/22 87  02/07/22 88   Pain Assessment Pain Score: 0-No pain/10  In general this is a well appearing African American male in no  acute distress. He's alert and oriented x4 and appropriate throughout the examination. Cardiopulmonary assessment is negative for acute distress and he exhibits normal effort.     ECOG = 1  0 - Asymptomatic (Fully active, able to carry on all predisease activities without restriction)  1 - Symptomatic but completely ambulatory (Restricted in physically strenuous activity but ambulatory and able to carry out work of a light or sedentary nature. For example, light housework, office work)  2 - Symptomatic, <50% in bed during the day (Ambulatory and capable of all self care but unable to carry out any work activities. Up and about more than 50% of waking hours)  3 - Symptomatic, >50% in bed, but not bedbound (Capable of only limited self-care, confined to bed or chair 50% or more of waking hours)  4 - Bedbound (Completely disabled. Cannot carry on any self-care. Totally confined to bed or chair)  5 - Death   Eustace Pen MM, Creech RH, Tormey DC, et al. [phone]). "Toxicity and response criteria of the [facility]". [place] Oncol. 5 (6): 649-55    LABORATORY DATA:  Lab Results  Component Value Date   WBC 6.6 02/07/2022   HGB 13.4 02/07/2022   HCT 39.9 02/07/2022   MCV 84.7 02/07/2022   PLT 266 02/07/2022   Lab Results  Component Value Date   NA 140 02/07/2022   K 3.4 (L) 02/07/2022   CL 103 02/07/2022   CO2 29 02/07/2022   Lab Results  Component Value Date   ALT 19 02/07/2022   AST 15 02/07/2022   ALKPHOS 75 02/07/2022   BILITOT 0.4 02/07/2022      RADIOGRAPHY: No results found.     IMPRESSION/PLAN: 1. Locally recurrent, Stage IIIa non-small cell lung carcinoma, adenocarcinoma of the RUL. Dr. Lisbeth Renshaw discusses the rationale for re-irradiation. Unfortunately we do not have access to his prior treatment records despite multiple requests for them. If they are located, they are not electronic from what we've been told. Dr. Lisbeth Renshaw does not desire to delay his  treatment further, and recommends proceeding with chemoRT. We discussed  the risks, benefits, short, and long term effects of radiotherapy, as well as the curative intent, and the patient is interested in proceeding. Dr. Lisbeth Renshaw discusses the delivery and logistics of radiotherapy and anticipates a course of 6 1/2 weeks of radiotherapy to the RUL and regional nodes of the chest. Written consent is obtained and placed in the chart, a copy was provided to the patient. He will be contacted for simulation by our team, and we anticipate his treatment starting with next week's chemotherapy administration on 02/13/22.     In a visit lasting 60 minutes, greater than 50% of the time was spent face to face discussing the patient's condition, in preparation for the discussion, and coordinating the patient's care.   The above documentation reflects my direct findings during this shared patient visit. Please see the separate note by Dr. Lisbeth Renshaw on this date for the remainder of the patient's plan of care.    Carola Rhine, [facility]   **Disclaimer: This note was dictated with voice recognition software. Similar sounding words can inadvertently be transcribed and this note may contain transcription errors which may not have been corrected upon publication of note.**

## 2022-02-07 ENCOUNTER — Inpatient Hospital Stay: Payer: Medicare Other

## 2022-02-07 ENCOUNTER — Inpatient Hospital Stay (HOSPITAL_BASED_OUTPATIENT_CLINIC_OR_DEPARTMENT_OTHER): Payer: Medicare Other | Admitting: Physician Assistant

## 2022-02-07 ENCOUNTER — Other Ambulatory Visit: Payer: Self-pay

## 2022-02-07 ENCOUNTER — Ambulatory Visit
Admission: RE | Admit: 2022-02-07 | Discharge: 2022-02-07 | Disposition: A | Discharge status: Home / Self Care | Payer: Medicare Other | Source: Ambulatory Visit | Attending: Radiation Oncology | Admitting: Radiation Oncology

## 2022-02-07 ENCOUNTER — Encounter: Payer: Self-pay | Admitting: Radiation Oncology

## 2022-02-07 VITALS — BP 95/76 | HR 88 | Resp 16

## 2022-02-07 VITALS — BP 99/67 | HR 87 | Temp 97.8°F | Resp 16 | Ht 73.0 in | Wt 136.2 lb

## 2022-02-07 VITALS — BP 99/67 | HR 87 | Temp 97.8°F | Resp 16 | Ht 73.0 in | Wt 136.4 lb

## 2022-02-07 DIAGNOSIS — C3411 Malignant neoplasm of upper lobe, right bronchus or lung: Secondary | ICD-10-CM

## 2022-02-07 DIAGNOSIS — C3491 Malignant neoplasm of unspecified part of right bronchus or lung: Secondary | ICD-10-CM | POA: Diagnosis not present

## 2022-02-07 DIAGNOSIS — Z87891 Personal history of nicotine dependence: Secondary | ICD-10-CM | POA: Diagnosis not present

## 2022-02-07 DIAGNOSIS — Z923 Personal history of irradiation: Secondary | ICD-10-CM | POA: Insufficient documentation

## 2022-02-07 DIAGNOSIS — Z85118 Personal history of other malignant neoplasm of bronchus and lung: Secondary | ICD-10-CM

## 2022-02-07 LAB — CMP (CANCER CENTER ONLY)
ALT: 19 U/L (ref 0–44)
AST: 15 U/L (ref 15–41)
Albumin: 3.6 g/dL (ref 3.5–5.0)
Alkaline Phosphatase: 75 U/L (ref 38–126)
Anion gap: 8 (ref 5–15)
BUN: 13 mg/dL (ref 8–23)
CO2: 29 mmol/L (ref 22–32)
Calcium: 9.4 mg/dL (ref 8.9–10.3)
Chloride: 103 mmol/L (ref 98–111)
Creatinine: 0.86 mg/dL (ref 0.61–1.24)
GFR, Estimated: >60 mL/min (ref >60)
Glucose, Bld: 151 mg/dL — ABNORMAL HIGH (ref 70–99)
Potassium: 3.4 mmol/L — ABNORMAL LOW (ref 3.5–5.1)
Sodium: 140 mmol/L (ref 135–145)
Total Bilirubin: 0.4 mg/dL (ref 0.3–1.2)
Total Protein: 7.2 g/dL (ref 6.5–8.1)

## 2022-02-07 LAB — CBC WITH DIFFERENTIAL (CANCER CENTER ONLY)
Abs Immature Granulocytes: 0.04 10*3/uL (ref 0.00–0.07)
Basophils Absolute: 0.1 10*3/uL (ref 0.0–0.1)
Basophils Relative: 1 %
Eosinophils Absolute: 0.1 10*3/uL (ref 0.0–0.5)
Eosinophils Relative: 1 %
HCT: 39.9 % (ref 39.0–52.0)
Hemoglobin: 13.4 g/dL (ref 13.0–17.0)
Immature Granulocytes: 1 %
Lymphocytes Relative: 22 %
Lymphs Abs: 1.4 10*3/uL (ref 0.7–4.0)
MCH: 28.5 pg (ref 26.0–34.0)
MCHC: 33.6 g/dL (ref 30.0–36.0)
MCV: 84.7 fL (ref 80.0–100.0)
Monocytes Absolute: 0.5 10*3/uL (ref 0.1–1.0)
Monocytes Relative: 8 %
Neutro Abs: 4.5 10*3/uL (ref 1.7–7.7)
Neutrophils Relative %: 67 %
Platelet Count: 266 10*3/uL (ref 150–400)
RBC: 4.71 MIL/uL (ref 4.22–5.81)
RDW: 17.4 % — ABNORMAL HIGH (ref 11.5–15.5)
WBC Count: 6.6 10*3/uL (ref 4.0–10.5)
nRBC: 0 % (ref 0.0–0.2)

## 2022-02-07 MED ORDER — CETIRIZINE HCL 10 MG/ML IV SOLN
10.0000 mg | Freq: Once | INTRAVENOUS | Status: AC
  Administered 2022-02-07: 10 mg via INTRAVENOUS
  Filled 2022-02-07: qty 1

## 2022-02-07 MED ORDER — SODIUM CHLORIDE 0.9 % IV SOLN
45.0000 mg/m2 | Freq: Once | INTRAVENOUS | Status: AC
  Administered 2022-02-07: 78 mg via INTRAVENOUS
  Filled 2022-02-07: qty 13

## 2022-02-07 MED ORDER — FAMOTIDINE IN NACL 20-0.9 MG/50ML-% IV SOLN
20.0000 mg | Freq: Once | INTRAVENOUS | Status: AC
  Administered 2022-02-07: 20 mg via INTRAVENOUS
  Filled 2022-02-07: qty 50

## 2022-02-07 MED ORDER — PALONOSETRON HCL INJECTION 0.25 MG/5ML
0.2500 mg | Freq: Once | INTRAVENOUS | Status: AC
  Administered 2022-02-07: 0.25 mg via INTRAVENOUS
  Filled 2022-02-07: qty 5

## 2022-02-07 MED ORDER — SODIUM CHLORIDE 0.9 % IV SOLN
168.2000 mg | Freq: Once | INTRAVENOUS | Status: AC
  Administered 2022-02-07: 170 mg via INTRAVENOUS
  Filled 2022-02-07: qty 17

## 2022-02-07 MED ORDER — SODIUM CHLORIDE 0.9 % IV SOLN: Freq: Once | INTRAVENOUS | Status: AC

## 2022-02-07 MED ORDER — SODIUM CHLORIDE 0.9 % IV SOLN
10.0000 mg | Freq: Once | INTRAVENOUS | Status: AC
  Administered 2022-02-07: 10 mg via INTRAVENOUS
  Filled 2022-02-07: qty 10

## 2022-02-07 NOTE — Progress Notes (Signed)
[name] OFFICE PROGRESS NOTE  Jared Stevens, Jared Brooke, NP [place] [address]  DIAGNOSIS: Recurrent non-small cell lung cancer, adenocarcinoma that was initially diagnosed as stage IIIA non-small cell lung cancer, adenocarcinoma diagnosed in 2017  PRIOR THERAPY: status post a course of concurrent chemoradiation with weekly carboplatin and paclitaxel completed in early 2018 in [place]. The patient has been in observation since that time. The patient previously saw Dr. Krystal Clark   CURRENT THERAPY: Retreatment with concurrent chemoradiation with weekly carboplatin for AUC of 2 and paclitaxel 45 Mg/M2.  First dose January 16, 2022. Status post 3 cycles.   INTERVAL HISTORY: Jared Stevens 71 y.o. male returns to the clinic today for follow-up visit.  The patient was last seen by [name] PA-C on 01/23/2022.   [name]  reports he is tolerating chemotherapy without any significant limitations.  His energy levels are stable and is able to complete his daily activities on his own.  He denies any appetite loss or weight loss.  He denies nausea, vomiting or abdominal pain.  His bowel habits are unchanged without any recurrent episodes of diarrhea or constipation.  He reports stable shortness of breath with exertion that has not changed while on chemotherapy.  He denies fevers, chills, night sweats, chest pain, headaches, dizziness, peripheral edema or neuropathy.  He has no other complaints.  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  MEDICATIONS:  Current Outpatient Medications  Medication Sig Dispense Refill   albuterol (VENTOLIN HFA) 108 (90 Base) MCG/ACT inhaler Inhale 1-2 puffs into the lungs every 6 (six) hours as needed for wheezing or shortness of breath. Patient states he takes only as needed. 8 g 0   amitriptyline (ELAVIL) 50 MG tablet Take 50 mg by mouth at bedtime as needed for sleep.     budesonide-formoterol (SYMBICORT) 160-4.5 MCG/ACT inhaler INHALE TWO PUFFS INTO THE LUNGS TWICE DAILY (IN THE MORNING AND IN THE EVENING) 10.2 g 0   carvedilol (COREG) 3.125 MG tablet TAKE 1 TABLET (3.125 MG TOTAL) BY MOUTH 2 (TWO) TIMES DAILY WITH A MEAL. 180 tablet 3   Cyanocobalamin (B-12) 2500 MCG SUBL Place 2,500 mcg under the tongue daily.     digoxin (LANOXIN) 0.125 MG tablet TAKE 1 TABLET (0.125 MG TOTAL) BY MOUTH DAILY. 90 tablet 3   finasteride (PROSCAR) 5 MG tablet Take 5 mg by mouth daily.     furosemide (LASIX) 40 MG  tablet Take 1 tablet (40 mg total) by mouth daily. 90 tablet 1   latanoprost (XALATAN) 0.005 % ophthalmic solution Place 1 drop into both eyes daily.  11   prochlorperazine (COMPAZINE) 10 MG tablet Take 1 tablet (10 mg total) by mouth every 6 (six) hours as needed for nausea or vomiting. (Patient not taking: Reported on 01/23/2022) 30 tablet 0   rosuvastatin (CRESTOR) 40 MG tablet TAKE ONE TABLET BY MOUTH EVERY DAY 90 tablet 3   spironolactone (ALDACTONE) 25 MG tablet TAKE 0.5 TABLETS (12.5 MG TOTAL) BY  MOUTH EVERY OTHER DAY. 45 tablet 1   tamsulosin (FLOMAX) 0.4 MG CAPS capsule Take 0.4 mg by mouth at bedtime.     warfarin (COUMADIN) 5 MG tablet MONDAY - THURSDAY TAKE ONE TAB DAILY AND FRIDAY - SUNDAY TAKE 1.5 TABS DAILY 45 tablet 0   No current facility-administered medications for this visit.    SURGICAL HISTORY:  Past Surgical History:  Procedure Laterality Date   Arm Surgery     BRONCHIAL BIOPSY  12/27/2021   Procedure: BRONCHIAL BIOPSIES;  Surgeon: Garner Nash, DO;  Location: [facility];  Service: Pulmonary;;   BRONCHIAL NEEDLE ASPIRATION BIOPSY  12/27/2021   Procedure: BRONCHIAL NEEDLE ASPIRATION BIOPSIES;  Surgeon: Garner Nash, DO;  Location: [facility];  Service: Pulmonary;;   HERNIA REPAIR     IR ANGIO INTRA EXTRACRAN SEL COM CAROTID INNOMINATE UNI L MOD SED  10/21/2018   IR ANGIO VERTEBRAL SEL SUBCLAVIAN INNOMINATE UNI R MOD SED  10/21/2018   IR CT HEAD LTD  10/21/2018   IR PERCUTANEOUS ART THROMBECTOMY/INFUSION INTRACRANIAL INC DIAG ANGIO  10/21/2018   IR THORACENTESIS ASP PLEURAL SPACE W/IMG GUIDE  09/27/2018   RADIOLOGY WITH ANESTHESIA N/A 10/21/2018   Procedure: RADIOLOGY WITH ANESTHESIA;  Surgeon: Luanne Bras, MD;  Location: [facility];  Service: Radiology;  Laterality: N/A;   REPLANTATION THUMB     RIGHT/LEFT HEART CATH AND CORONARY ANGIOGRAPHY N/A 10/01/2018   Procedure: RIGHT/LEFT HEART CATH AND CORONARY ANGIOGRAPHY;  Surgeon: Troy Sine, MD;  Location: [facility];  Service: Cardiovascular;  Laterality: N/A;    REVIEW OF SYSTEMS:   Review of Systems  Constitutional: Negative for appetite change, chills, fatigue, fever and unexpected weight change.  HENT: Negative for mouth sores, nosebleeds, sore throat and trouble swallowing.   Eyes: Negative for eye problems and icterus.  Respiratory: Positive for mild dyspnea. Negative for hemoptysis and wheezing.   Cardiovascular: Negative for chest pain and leg swelling.  Gastrointestinal: Negative for  abdominal pain, constipation, diarrhea, nausea and vomiting.  Genitourinary: Negative for bladder incontinence, difficulty urinating, dysuria, frequency and hematuria.   Musculoskeletal: Negative for back pain, gait problem, neck pain and neck stiffness.  Skin: Negative for itching and rash.  Neurological: Negative for dizziness, extremity weakness, gait problem, headaches, light-headedness and seizures.  Hematological: Negative for adenopathy. Does not bruise/bleed easily.  Psychiatric/Behavioral: Negative for confusion, depression and sleep disturbance. The patient is not nervous/anxious.     PHYSICAL EXAMINATION:  Blood pressure 102/71, pulse 92, temperature 97.9 F (36.6 C), temperature source Temporal, resp. rate 15, weight 134 lb 8 oz (61 kg), SpO2 96 %.  ECOG PERFORMANCE STATUS: 1  Physical Exam  Constitutional: Oriented to person, place, and time and thin appearing male and in no distress.  HENT:  Head: Normocephalic and atraumatic.  Mouth/Throat: Oropharynx is clear and moist. No oropharyngeal exudate.  Eyes: Conjunctivae are normal. Right eye exhibits no discharge. Left eye exhibits no discharge. No  scleral icterus. .  Cardiovascular: Normal rate, regular rhythm, normal heart sounds and intact distal pulses.   Pulmonary/Chest: Effort normal and breath sounds normal. No respiratory distress. No wheezes. No rales.  Musculoskeletal: Normal range of motion. Exhibits no edema.  Lymphadenopathy:    No cervical adenopathy.  Neurological: Alert and oriented to person, place, and time.  Exhibits muscle wasting.  Gait normal. Coordination normal.  Positive for ambulates with a cane. Skin: Skin is warm and dry. No rash noted. Not diaphoretic. No erythema. No pallor.  Psychiatric: Mood, memory and judgment normal.  Vitals reviewed.  LABORATORY DATA: Lab Results  Component Value Date   WBC 6.6 02/07/2022   HGB 13.4 02/07/2022   HCT 39.9 02/07/2022   MCV 84.7 02/07/2022   PLT 266  02/07/2022      Chemistry      Component Value Date/Time   NA 140 02/07/2022 0807   NA 138 06/01/2021 1239   K 3.4 (L) 02/07/2022 0807   CL 103 02/07/2022 0807   CO2 29 02/07/2022 0807   BUN 13 02/07/2022 0807   BUN 17 06/01/2021 1239   CREATININE 0.86 02/07/2022 0807      Component Value Date/Time   CALCIUM 9.4 02/07/2022 0807   ALKPHOS 75 02/07/2022 0807   AST 15 02/07/2022 0807   ALT 19 02/07/2022 0807   BILITOT 0.4 02/07/2022 0807       RADIOGRAPHIC STUDIES:  No results found.   ASSESSMENT/PLAN:  This is a very pleasant 71 year old African-American male with recurrent non-small cell lung cancer, adenocarcinoma.  He was diagnosed with recurrence in August 2023.  The patient has a history of stage IIIa non-small cell lung cancer, adenocarcinoma.  He is status post course of concurrent chemoradiation in [place] in 2017.  He states he previously saw Dr. Krystal Clark from radiation oncology in [place].   In the summer 2023, the patient's imaging showed progressive soft tissue density in the right upper lobe medially adjacent to the aortic arch and SVC worrisome for possible neoplastic process.  Therefore the patient had a PET scan performed that showed progressive soft tissue density in the medial right upper lobe adjacent to the ascending aorta and SVC on recent CT scan is markedly hypermetabolic consistent with disease recurrence.  There is low-level left DG accumulation in the subcarinal station that may reflect hypermetabolic nodal metastasis but no evidence of hypermetabolic metastatic disease in the neck, abdomen, or pelvis.  Underwent bronchoscopy on 12/27/2021 by Dr. Valeta Harms and the final pathology was consistent with recurrent adenocarcinoma of the lung.  Treatment recommendation is a course of chemoradiation.  The patient is currently undergoing carboplatin for an AUC of 2 and paclitaxel 45 mg/m2 that started on 01/16/2022.    Patient presents today for Cycle 4 of  carbo/taxol today. Labs from today were reviewed and adequate for treatment today. No cytopenias and mild hypokalemia with potassium level of 3.4. Encouraged to eat potassium rich foods. Patient will proceed with treatment today as planned without any dose modifications. He is scheduled for consultation with Dr. Lisbeth Renshaw in radiation oncology today. He will return next week for labs and Cycle 5 of chemotherapy. He is return for a clinic visit to see Dr. Julien Nordmann in two weeks.   Patient expressed understanding and satisfaction with the plan provided. He is aware to call us if he has any questions/concerns.   I have spent a total of 30 minutes minutes of face-to-face and non-face-to-face time, preparing to see the [place] a  medically appropriate examination, counseling and educating the patient, documenting clinical information in the electronic health record, and care coordination.   Dede Query PA-C Dept of Hematology and [place] at [facility] Phone: [phone]

## 2022-02-07 NOTE — Patient Instructions (Addendum)
[facility]  Discharge Instructions: Thank you for choosing [facility] to provide your oncology and hematology care.   If you have a lab appointment with the [facility], please go directly to the [facility] and check in at the registration area.   Wear comfortable clothing and clothing appropriate for easy access to any Portacath or PICC line.   We strive to give you quality time with your provider. You may need to reschedule your appointment if you arrive late (15 or more minutes).  Arriving late affects you and other patients whose appointments are after yours.  Also, if you miss three or more appointments without notifying the office, you may be dismissed from the clinic at the provider's discretion.      For prescription refill requests, have your pharmacy contact our office and allow 72 hours for refills to be completed.    Today you received the following chemotherapy and/or immunotherapy agents: Taxol, Carboplatin     To help prevent nausea and vomiting after your treatment, we encourage you to take your nausea medication as directed.  BELOW ARE SYMPTOMS THAT SHOULD BE REPORTED IMMEDIATELY: *FEVER GREATER THAN 100.4 F (38 C) OR HIGHER *CHILLS OR SWEATING *NAUSEA AND VOMITING THAT IS NOT CONTROLLED WITH YOUR NAUSEA MEDICATION *UNUSUAL SHORTNESS OF BREATH *UNUSUAL BRUISING OR BLEEDING *URINARY PROBLEMS (pain or burning when urinating, or frequent urination) *BOWEL PROBLEMS (unusual diarrhea, constipation, pain near the anus) TENDERNESS IN MOUTH AND THROAT WITH OR WITHOUT PRESENCE OF ULCERS (sore throat, sores in mouth, or a toothache) UNUSUAL RASH, SWELLING OR PAIN  UNUSUAL VAGINAL DISCHARGE OR ITCHING   Items with * indicate a potential emergency and should be followed up as soon as possible or go to the Emergency Department if any problems should occur.  Please show the CHEMOTHERAPY ALERT CARD or IMMUNOTHERAPY ALERT CARD at  check-in to the Emergency Department and triage nurse.  Should you have questions after your visit or need to cancel or reschedule your appointment, please contact [facility]  Dept: [phone]  and follow the prompts.  Office hours are 8:00 a.m. to 4:30 p.m. Monday - Friday. Please note that voicemails left after 4:00 p.m. may not be returned until the following business day.  We are closed weekends and major holidays. You have access to a nurse at all times for urgent questions. Please call the main number to the clinic Dept: [phone] and follow the prompts.   For any non-urgent questions, you may also contact your provider using MyChart. We now offer e-Visits for anyone 42 and older to request care online for non-urgent symptoms. For details visit [email or web address].   Also download the MyChart app! Go to the app store, search "MyChart", open the app, select [facility], and log in with your MyChart username and password.  Masks are optional in the cancer centers. If you would like for your care team to wear a mask while they are taking care of you, please let them know. You may have one support person who is at least 71 years old accompany you for your appointments.

## 2022-02-08 ENCOUNTER — Other Ambulatory Visit: Payer: Self-pay

## 2022-02-08 ENCOUNTER — Ambulatory Visit
Admission: RE | Admit: 2022-02-08 | Discharge: 2022-02-08 | Disposition: A | Discharge status: Home / Self Care | Payer: Medicare Other | Source: Ambulatory Visit | Attending: Radiation Oncology | Admitting: Radiation Oncology

## 2022-02-08 DIAGNOSIS — C3411 Malignant neoplasm of upper lobe, right bronchus or lung: Secondary | ICD-10-CM | POA: Diagnosis present

## 2022-02-08 DIAGNOSIS — Z510 Encounter for antineoplastic radiation therapy: Secondary | ICD-10-CM | POA: Diagnosis present

## 2022-02-08 DIAGNOSIS — Z87891 Personal history of nicotine dependence: Secondary | ICD-10-CM | POA: Diagnosis not present

## 2022-02-08 DIAGNOSIS — Z5111 Encounter for antineoplastic chemotherapy: Secondary | ICD-10-CM | POA: Diagnosis present

## 2022-02-09 ENCOUNTER — Ambulatory Visit (INDEPENDENT_AMBULATORY_CARE_PROVIDER_SITE_OTHER): Payer: Medicare Other

## 2022-02-09 DIAGNOSIS — Z7901 Long term (current) use of anticoagulants: Secondary | ICD-10-CM | POA: Diagnosis not present

## 2022-02-09 LAB — POCT INR: INR: 1.3 — AB (ref 2.0–3.0)

## 2022-02-09 NOTE — Progress Notes (Unsigned)
Per orders of Wilfred Lacy, NP, Pt is here for INR check. Capillary stick done on LT middle finger, done by Leonor Liv, CMA. Pt tolerated stick well.   Goal INR = 2.0-3.0  Last INR = 10/20/21  Pt currently takes Coumadin   Date of last Coumadin dose = 02/09/22   Pt denies recent antibiotics, no dietary changes and no unusual bruising / bleeding.  INR today = 1.3  Pt advised per

## 2022-02-10 MED FILL — Dexamethasone Sodium Phosphate Inj 100 MG/10ML: INTRAMUSCULAR | Qty: 1 | Status: AC

## 2022-02-10 NOTE — Progress Notes (Signed)
Called and informed patient of results and provider instructions. Patient voiced understanding.

## 2022-02-11 ENCOUNTER — Other Ambulatory Visit: Payer: Self-pay

## 2022-02-13 ENCOUNTER — Other Ambulatory Visit: Payer: Self-pay

## 2022-02-13 ENCOUNTER — Inpatient Hospital Stay: Payer: Medicare Other

## 2022-02-13 ENCOUNTER — Ambulatory Visit: Payer: Medicare Other | Admitting: Radiation Oncology

## 2022-02-13 VITALS — BP 113/74 | HR 82 | Temp 97.5°F | Resp 16 | Wt 133.0 lb

## 2022-02-13 DIAGNOSIS — C3491 Malignant neoplasm of unspecified part of right bronchus or lung: Secondary | ICD-10-CM

## 2022-02-13 DIAGNOSIS — C3411 Malignant neoplasm of upper lobe, right bronchus or lung: Secondary | ICD-10-CM | POA: Diagnosis not present

## 2022-02-13 DIAGNOSIS — Z510 Encounter for antineoplastic radiation therapy: Secondary | ICD-10-CM | POA: Diagnosis not present

## 2022-02-13 DIAGNOSIS — Z87891 Personal history of nicotine dependence: Secondary | ICD-10-CM | POA: Diagnosis not present

## 2022-02-13 DIAGNOSIS — Z5111 Encounter for antineoplastic chemotherapy: Secondary | ICD-10-CM | POA: Diagnosis not present

## 2022-02-13 LAB — CBC WITH DIFFERENTIAL (CANCER CENTER ONLY)
Abs Immature Granulocytes: 0.06 10*3/uL (ref 0.00–0.07)
Basophils Absolute: 0.1 10*3/uL (ref 0.0–0.1)
Basophils Relative: 1 %
Eosinophils Absolute: 0.1 10*3/uL (ref 0.0–0.5)
Eosinophils Relative: 1 %
HCT: 40.1 % (ref 39.0–52.0)
Hemoglobin: 13.3 g/dL (ref 13.0–17.0)
Immature Granulocytes: 1 %
Lymphocytes Relative: 19 %
Lymphs Abs: 1.3 10*3/uL (ref 0.7–4.0)
MCH: 28.2 pg (ref 26.0–34.0)
MCHC: 33.2 g/dL (ref 30.0–36.0)
MCV: 85.1 fL (ref 80.0–100.0)
Monocytes Absolute: 0.6 10*3/uL (ref 0.1–1.0)
Monocytes Relative: 9 %
Neutro Abs: 4.5 10*3/uL (ref 1.7–7.7)
Neutrophils Relative %: 69 %
Platelet Count: 377 10*3/uL (ref 150–400)
RBC: 4.71 MIL/uL (ref 4.22–5.81)
RDW: 17.9 % — ABNORMAL HIGH (ref 11.5–15.5)
WBC Count: 6.5 10*3/uL (ref 4.0–10.5)
nRBC: 0 % (ref 0.0–0.2)

## 2022-02-13 LAB — CMP (CANCER CENTER ONLY)
ALT: 15 U/L (ref 0–44)
AST: 15 U/L (ref 15–41)
Albumin: 3.6 g/dL (ref 3.5–5.0)
Alkaline Phosphatase: 65 U/L (ref 38–126)
Anion gap: 9 (ref 5–15)
BUN: 14 mg/dL (ref 8–23)
CO2: 26 mmol/L (ref 22–32)
Calcium: 9.2 mg/dL (ref 8.9–10.3)
Chloride: 102 mmol/L (ref 98–111)
Creatinine: 0.82 mg/dL (ref 0.61–1.24)
GFR, Estimated: >60 mL/min (ref >60)
Glucose, Bld: 107 mg/dL — ABNORMAL HIGH (ref 70–99)
Potassium: 3.8 mmol/L (ref 3.5–5.1)
Sodium: 137 mmol/L (ref 135–145)
Total Bilirubin: 0.4 mg/dL (ref 0.3–1.2)
Total Protein: 7.6 g/dL (ref 6.5–8.1)

## 2022-02-13 MED ORDER — SODIUM CHLORIDE 0.9 % IV SOLN: Freq: Once | INTRAVENOUS | Status: AC

## 2022-02-13 MED ORDER — SODIUM CHLORIDE 0.9 % IV SOLN
45.0000 mg/m2 | Freq: Once | INTRAVENOUS | Status: AC
  Administered 2022-02-13: 78 mg via INTRAVENOUS
  Filled 2022-02-13: qty 13

## 2022-02-13 MED ORDER — CETIRIZINE HCL 10 MG/ML IV SOLN
10.0000 mg | Freq: Once | INTRAVENOUS | Status: AC
  Administered 2022-02-13: 10 mg via INTRAVENOUS
  Filled 2022-02-13: qty 1

## 2022-02-13 MED ORDER — FAMOTIDINE IN NACL 20-0.9 MG/50ML-% IV SOLN
20.0000 mg | Freq: Once | INTRAVENOUS | Status: AC
  Administered 2022-02-13: 20 mg via INTRAVENOUS
  Filled 2022-02-13: qty 50

## 2022-02-13 MED ORDER — SODIUM CHLORIDE 0.9 % IV SOLN
10.0000 mg | Freq: Once | INTRAVENOUS | Status: AC
  Administered 2022-02-13: 10 mg via INTRAVENOUS
  Filled 2022-02-13: qty 10

## 2022-02-13 MED ORDER — PALONOSETRON HCL INJECTION 0.25 MG/5ML
0.2500 mg | Freq: Once | INTRAVENOUS | Status: AC
  Administered 2022-02-13: 0.25 mg via INTRAVENOUS
  Filled 2022-02-13: qty 5

## 2022-02-13 MED ORDER — SODIUM CHLORIDE 0.9 % IV SOLN
168.2000 mg | Freq: Once | INTRAVENOUS | Status: AC
  Administered 2022-02-13: 170 mg via INTRAVENOUS
  Filled 2022-02-13: qty 17

## 2022-02-13 NOTE — Patient Instructions (Signed)
[facility]   Discharge Instructions: Thank you for choosing [facility] to provide your oncology and hematology care.   If you have a lab appointment with the [facility], please go directly to the [facility] and check in at the registration area.   Wear comfortable clothing and clothing appropriate for easy access to any Portacath or PICC line.   We strive to give you quality time with your provider. You may need to reschedule your appointment if you arrive late (15 or more minutes).  Arriving late affects you and other patients whose appointments are after yours.  Also, if you miss three or more appointments without notifying the office, you may be dismissed from the clinic at the provider's discretion.      For prescription refill requests, have your pharmacy contact our office and allow 72 hours for refills to be completed.    Today you received the following chemotherapy and/or immunotherapy agents: paclitaxel and carboplatin      To help prevent nausea and vomiting after your treatment, we encourage you to take your nausea medication as directed.  BELOW ARE SYMPTOMS THAT SHOULD BE REPORTED IMMEDIATELY: *FEVER GREATER THAN 100.4 F (38 C) OR HIGHER *CHILLS OR SWEATING *NAUSEA AND VOMITING THAT IS NOT CONTROLLED WITH YOUR NAUSEA MEDICATION *UNUSUAL SHORTNESS OF BREATH *UNUSUAL BRUISING OR BLEEDING *URINARY PROBLEMS (pain or burning when urinating, or frequent urination) *BOWEL PROBLEMS (unusual diarrhea, constipation, pain near the anus) TENDERNESS IN MOUTH AND THROAT WITH OR WITHOUT PRESENCE OF ULCERS (sore throat, sores in mouth, or a toothache) UNUSUAL RASH, SWELLING OR PAIN  UNUSUAL VAGINAL DISCHARGE OR ITCHING   Items with * indicate a potential emergency and should be followed up as soon as possible or go to the Emergency Department if any problems should occur.  Please show the CHEMOTHERAPY ALERT CARD or IMMUNOTHERAPY ALERT  CARD at check-in to the Emergency Department and triage nurse.  Should you have questions after your visit or need to cancel or reschedule your appointment, please contact [facility]  Dept: [phone]  and follow the prompts.  Office hours are 8:00 a.m. to 4:30 p.m. Monday - Friday. Please note that voicemails left after 4:00 p.m. may not be returned until the following business day.  We are closed weekends and major holidays. You have access to a nurse at all times for urgent questions. Please call the main number to the clinic Dept: [phone] and follow the prompts.   For any non-urgent questions, you may also contact your provider using MyChart. We now offer e-Visits for anyone 64 and older to request care online for non-urgent symptoms. For details visit [email or web address].   Also download the MyChart app! Go to the app store, search "MyChart", open the app, select [facility], and log in with your MyChart username and password.  Masks are optional in the cancer centers. If you would like for your care team to wear a mask while they are taking care of you, please let them know. You may have one support person who is at least 71 years old accompany you for your appointments.

## 2022-02-14 ENCOUNTER — Ambulatory Visit: Payer: Medicare Other

## 2022-02-15 ENCOUNTER — Other Ambulatory Visit: Payer: Self-pay

## 2022-02-15 ENCOUNTER — Ambulatory Visit: Payer: Medicare Other

## 2022-02-16 ENCOUNTER — Ambulatory Visit
Admission: RE | Admit: 2022-02-16 | Discharge: 2022-02-16 | Disposition: A | Discharge status: Home / Self Care | Payer: Medicare Other | Source: Ambulatory Visit | Attending: Radiation Oncology | Admitting: Radiation Oncology

## 2022-02-16 ENCOUNTER — Other Ambulatory Visit: Payer: Self-pay

## 2022-02-16 ENCOUNTER — Ambulatory Visit: Payer: Medicare Other

## 2022-02-16 DIAGNOSIS — C3411 Malignant neoplasm of upper lobe, right bronchus or lung: Secondary | ICD-10-CM | POA: Diagnosis not present

## 2022-02-16 DIAGNOSIS — Z5111 Encounter for antineoplastic chemotherapy: Secondary | ICD-10-CM | POA: Diagnosis not present

## 2022-02-16 DIAGNOSIS — Z87891 Personal history of nicotine dependence: Secondary | ICD-10-CM | POA: Diagnosis not present

## 2022-02-16 DIAGNOSIS — Z510 Encounter for antineoplastic radiation therapy: Secondary | ICD-10-CM | POA: Diagnosis not present

## 2022-02-16 LAB — RAD ONC ARIA SESSION SUMMARY
Course Elapsed Days: 0
Plan Fractions Treated to Date: 1
Plan Prescribed Dose Per Fraction: 1.8 Gy
Plan Total Fractions Prescribed: 33
Plan Total Prescribed Dose: 59.4 Gy
Reference Point Dosage Given to Date: 1.8 Gy
Reference Point Session Dosage Given: 1.8 Gy
Session Number: 1

## 2022-02-17 ENCOUNTER — Ambulatory Visit
Admission: RE | Admit: 2022-02-17 | Discharge: 2022-02-17 | Disposition: A | Discharge status: Home / Self Care | Payer: Medicare Other | Source: Ambulatory Visit | Attending: Radiation Oncology | Admitting: Radiation Oncology

## 2022-02-17 ENCOUNTER — Other Ambulatory Visit: Payer: Self-pay

## 2022-02-17 ENCOUNTER — Ambulatory Visit: Payer: Medicare Other

## 2022-02-17 DIAGNOSIS — Z87891 Personal history of nicotine dependence: Secondary | ICD-10-CM | POA: Diagnosis not present

## 2022-02-17 DIAGNOSIS — Z510 Encounter for antineoplastic radiation therapy: Secondary | ICD-10-CM | POA: Diagnosis not present

## 2022-02-17 DIAGNOSIS — C3411 Malignant neoplasm of upper lobe, right bronchus or lung: Secondary | ICD-10-CM | POA: Diagnosis not present

## 2022-02-17 DIAGNOSIS — C3491 Malignant neoplasm of unspecified part of right bronchus or lung: Secondary | ICD-10-CM

## 2022-02-17 DIAGNOSIS — Z5111 Encounter for antineoplastic chemotherapy: Secondary | ICD-10-CM | POA: Diagnosis not present

## 2022-02-17 LAB — RAD ONC ARIA SESSION SUMMARY
Course Elapsed Days: 1
Plan Fractions Treated to Date: 2
Plan Prescribed Dose Per Fraction: 1.8 Gy
Plan Total Fractions Prescribed: 33
Plan Total Prescribed Dose: 59.4 Gy
Reference Point Dosage Given to Date: 3.6 Gy
Reference Point Session Dosage Given: 1.8 Gy
Session Number: 2

## 2022-02-17 MED ORDER — SONAFINE EX EMUL
1.0000 | Freq: Once | CUTANEOUS | Status: AC
  Administered 2022-02-17: 1 via TOPICAL

## 2022-02-17 MED FILL — Dexamethasone Sodium Phosphate Inj 100 MG/10ML: INTRAMUSCULAR | Qty: 1 | Status: AC

## 2022-02-17 NOTE — Progress Notes (Signed)
Pt here for patient teaching.  Pt given Radiation and You booklet, skin care instructions, and Sonafine.  Reviewed areas of pertinence such as fatigue, hair loss, skin changes, throat changes, cough, and shortness of breath . Pt able to give teach back of to pat skin and use unscented/gentle soap,apply Sonafine bid and avoid applying anything to skin within 4 hours of treatment. Pt verbalizes understanding of information given and will contact nursing with any questions or concerns.    Gloriajean Dell. Leonie Green, BSN

## 2022-02-18 ENCOUNTER — Other Ambulatory Visit: Payer: Self-pay | Admitting: Nurse Practitioner

## 2022-02-18 DIAGNOSIS — J449 Chronic obstructive pulmonary disease, unspecified: Secondary | ICD-10-CM

## 2022-02-20 ENCOUNTER — Ambulatory Visit: Payer: Medicare Other

## 2022-02-20 ENCOUNTER — Inpatient Hospital Stay: Payer: Medicare Other

## 2022-02-20 ENCOUNTER — Other Ambulatory Visit: Payer: Self-pay

## 2022-02-20 ENCOUNTER — Ambulatory Visit
Admission: RE | Admit: 2022-02-20 | Discharge: 2022-02-20 | Disposition: A | Discharge status: Home / Self Care | Payer: Medicare Other | Source: Ambulatory Visit | Attending: Radiation Oncology | Admitting: Radiation Oncology

## 2022-02-20 ENCOUNTER — Other Ambulatory Visit: Payer: Medicare Other

## 2022-02-20 ENCOUNTER — Inpatient Hospital Stay (HOSPITAL_BASED_OUTPATIENT_CLINIC_OR_DEPARTMENT_OTHER): Payer: Medicare Other | Admitting: Internal Medicine

## 2022-02-20 VITALS — BP 90/61 | HR 106 | Resp 17

## 2022-02-20 VITALS — BP 111/45 | HR 119 | Temp 98.1°F | Resp 16 | Wt 128.3 lb

## 2022-02-20 DIAGNOSIS — Z5111 Encounter for antineoplastic chemotherapy: Secondary | ICD-10-CM | POA: Diagnosis not present

## 2022-02-20 DIAGNOSIS — C3491 Malignant neoplasm of unspecified part of right bronchus or lung: Secondary | ICD-10-CM

## 2022-02-20 DIAGNOSIS — Z87891 Personal history of nicotine dependence: Secondary | ICD-10-CM | POA: Diagnosis not present

## 2022-02-20 DIAGNOSIS — C3411 Malignant neoplasm of upper lobe, right bronchus or lung: Secondary | ICD-10-CM | POA: Diagnosis not present

## 2022-02-20 DIAGNOSIS — Z510 Encounter for antineoplastic radiation therapy: Secondary | ICD-10-CM | POA: Diagnosis not present

## 2022-02-20 LAB — CBC WITH DIFFERENTIAL (CANCER CENTER ONLY)
Abs Immature Granulocytes: 0.09 10*3/uL — ABNORMAL HIGH (ref 0.00–0.07)
Basophils Absolute: 0.1 10*3/uL (ref 0.0–0.1)
Basophils Relative: 1 %
Eosinophils Absolute: 0.1 10*3/uL (ref 0.0–0.5)
Eosinophils Relative: 1 %
HCT: 40.8 % (ref 39.0–52.0)
Hemoglobin: 14 g/dL (ref 13.0–17.0)
Immature Granulocytes: 1 %
Lymphocytes Relative: 11 %
Lymphs Abs: 1.2 10*3/uL (ref 0.7–4.0)
MCH: 28.7 pg (ref 26.0–34.0)
MCHC: 34.3 g/dL (ref 30.0–36.0)
MCV: 83.8 fL (ref 80.0–100.0)
Monocytes Absolute: 1.6 10*3/uL — ABNORMAL HIGH (ref 0.1–1.0)
Monocytes Relative: 14 %
Neutro Abs: 7.9 10*3/uL — ABNORMAL HIGH (ref 1.7–7.7)
Neutrophils Relative %: 72 %
Platelet Count: 379 10*3/uL (ref 150–400)
RBC: 4.87 MIL/uL (ref 4.22–5.81)
RDW: 17.3 % — ABNORMAL HIGH (ref 11.5–15.5)
WBC Count: 11 10*3/uL — ABNORMAL HIGH (ref 4.0–10.5)
nRBC: 0 % (ref 0.0–0.2)

## 2022-02-20 LAB — RAD ONC ARIA SESSION SUMMARY
Course Elapsed Days: 4
Plan Fractions Treated to Date: 3
Plan Prescribed Dose Per Fraction: 1.8 Gy
Plan Total Fractions Prescribed: 33
Plan Total Prescribed Dose: 59.4 Gy
Reference Point Dosage Given to Date: 5.4 Gy
Reference Point Session Dosage Given: 1.8 Gy
Session Number: 3

## 2022-02-20 LAB — CMP (CANCER CENTER ONLY)
ALT: 22 U/L (ref 0–44)
AST: 22 U/L (ref 15–41)
Albumin: 3.6 g/dL (ref 3.5–5.0)
Alkaline Phosphatase: 68 U/L (ref 38–126)
Anion gap: 8 (ref 5–15)
BUN: 15 mg/dL (ref 8–23)
CO2: 31 mmol/L (ref 22–32)
Calcium: 9.9 mg/dL (ref 8.9–10.3)
Chloride: 94 mmol/L — ABNORMAL LOW (ref 98–111)
Creatinine: 1.26 mg/dL — ABNORMAL HIGH (ref 0.61–1.24)
GFR, Estimated: >60 mL/min (ref >60)
Glucose, Bld: 102 mg/dL — ABNORMAL HIGH (ref 70–99)
Potassium: 3.5 mmol/L (ref 3.5–5.1)
Sodium: 133 mmol/L — ABNORMAL LOW (ref 135–145)
Total Bilirubin: 0.5 mg/dL (ref 0.3–1.2)
Total Protein: 9 g/dL — ABNORMAL HIGH (ref 6.5–8.1)

## 2022-02-20 MED ORDER — FAMOTIDINE IN NACL 20-0.9 MG/50ML-% IV SOLN
20.0000 mg | Freq: Once | INTRAVENOUS | Status: AC
  Administered 2022-02-20: 20 mg via INTRAVENOUS
  Filled 2022-02-20: qty 50

## 2022-02-20 MED ORDER — SODIUM CHLORIDE 0.9 % IV SOLN
45.0000 mg/m2 | Freq: Once | INTRAVENOUS | Status: AC
  Administered 2022-02-20: 78 mg via INTRAVENOUS
  Filled 2022-02-20: qty 13

## 2022-02-20 MED ORDER — SODIUM CHLORIDE 0.9 % IV SOLN: Freq: Once | INTRAVENOUS | Status: AC

## 2022-02-20 MED ORDER — CETIRIZINE HCL 10 MG/ML IV SOLN
10.0000 mg | Freq: Once | INTRAVENOUS | Status: AC
  Administered 2022-02-20: 10 mg via INTRAVENOUS
  Filled 2022-02-20: qty 1

## 2022-02-20 MED ORDER — PALONOSETRON HCL INJECTION 0.25 MG/5ML
0.2500 mg | Freq: Once | INTRAVENOUS | Status: AC
  Administered 2022-02-20: 0.25 mg via INTRAVENOUS
  Filled 2022-02-20: qty 5

## 2022-02-20 MED ORDER — SODIUM CHLORIDE 0.9 % IV SOLN
143.8000 mg | Freq: Once | INTRAVENOUS | Status: AC
  Administered 2022-02-20: 140 mg via INTRAVENOUS
  Filled 2022-02-20: qty 14

## 2022-02-20 MED ORDER — SODIUM CHLORIDE 0.9 % IV SOLN
10.0000 mg | Freq: Once | INTRAVENOUS | Status: AC
  Administered 2022-02-20: 10 mg via INTRAVENOUS
  Filled 2022-02-20: qty 10

## 2022-02-20 NOTE — Patient Instructions (Signed)
[facility]COL[facility]  Discharge Instructions: Thank you for choosing [facility] to provide your oncology and hematology care.   If you have a lab appointment with the [facility], please go directly to the [facility] and check in at the registration area.   Wear comfortable clothing and clothing appropriate for easy access to any Portacath or PICC line.   We strive to give you quality time with your provider. You may need to reschedule your appointment if you arrive late (15 or more minutes).  Arriving late affects you and other patients whose appointments are after yours.  Also, if you miss three or more appointments without notifying the office, you may be dismissed from the clinic at the provider's discretion.      For prescription refill requests, have your pharmacy contact our office and allow 72 hours for refills to be completed.    Today you received the following chemotherapy and/or immunotherapy agents: Paclitaxel and Carboplatin      To help prevent nausea and vomiting after your treatment, we encourage you to take your nausea medication as directed.  BELOW ARE SYMPTOMS THAT SHOULD BE REPORTED IMMEDIATELY: *FEVER GREATER THAN 100.4 F (38 C) OR HIGHER *CHILLS OR SWEATING *NAUSEA AND VOMITING THAT IS NOT CONTROLLED WITH YOUR NAUSEA MEDICATION *UNUSUAL SHORTNESS OF BREATH *UNUSUAL BRUISING OR BLEEDING *URINARY PROBLEMS (pain or burning when urinating, or frequent urination) *BOWEL PROBLEMS (unusual diarrhea, constipation, pain near the anus) TENDERNESS IN MOUTH AND THROAT WITH OR WITHOUT PRESENCE OF ULCERS (sore throat, sores in mouth, or a toothache) UNUSUAL RASH, SWELLING OR PAIN  UNUSUAL VAGINAL DISCHARGE OR ITCHING   Items with * indicate a potential emergency and should be followed up as soon as possible or go to the Emergency Department if any problems should occur.  Please show the CHEMOTHERAPY ALERT CARD or IMMUNOTHERAPY ALERT  CARD at check-in to the Emergency Department and triage nurse.  Should you have questions after your visit or need to cancel or reschedule your appointment, please contact [facility]  Dept: [phone]  and follow the prompts.  Office hours are 8:00 a.m. to 4:30 p.m. Monday - Friday. Please note that voicemails left after 4:00 p.m. may not be returned until the following business day.  We are closed weekends and major holidays. You have access to a nurse at all times for urgent questions. Please call the main number to the clinic Dept: [phone] and follow the prompts.   For any non-urgent questions, you may also contact your provider using MyChart. We now offer e-Visits for anyone 41 and older to request care online for non-urgent symptoms. For details visit [email or web address].   Also download the MyChart app! Go to the app store, search "MyChart", open the app, select [facility], and log in with your MyChart username and password.  Masks are optional in the cancer centers. If you would like for your care team to wear a mask while they are taking care of you, please let them know. You may have one support person who is at least 71 years old accompany you for your appointments.

## 2022-02-20 NOTE — Progress Notes (Signed)
[facility] [phone]   [phone]  OFFICE PROGRESS NOTE  [name], [name], NP Jared Stevens [id number]  DIAGNOSIS: Recurrent non-small cell lung cancer, adenocarcinoma that was initially diagnosed as stage IIIA non-small cell lung cancer, adenocarcinoma diagnosed in 2017  PRIOR THERAPY: status post a course of concurrent chemoradiation with weekly carboplatin and paclitaxel completed in early 2018 at [facility]. The patient has been in observation since that time.  CURRENT THERAPY: Retreatment with concurrent chemoradiation with weekly carboplatin for AUC of 2 and paclitaxel 45 Mg/M2.  First dose January 16, 2022.  Status post 5 cycles.  INTERVAL HISTORY: Jared Stevens 71 y.o. male returns to the clinic today for follow-up visit.  The patient is feeling fine today with no concerning complaints except for fatigue and lack of appetite and he lost few more pounds since his last visit.  He denied having any current chest pain, shortness of breath, cough or hemoptysis.  He has no nausea, vomiting, diarrhea or constipation.  He has no headache or visual changes.  He just started his concurrent radiotherapy last week after some delays because of lack of his previous radiation records.  He is here today for evaluation before starting cycle #6 of his treatment.   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  MEDICATIONS:  Current Outpatient Medications  Medication Sig Dispense Refill   albuterol (VENTOLIN HFA) 108 (90 Base) MCG/ACT inhaler Inhale 1-2 puffs into the lungs every 6 (six) hours as needed for wheezing or shortness of breath. Patient states he takes only as needed. 8 g 0   amitriptyline (ELAVIL) 50 MG tablet Take 50 mg by mouth at bedtime as needed for sleep.     budesonide-formoterol (SYMBICORT) 160-4.5 MCG/ACT inhaler INHALE TWO PUFFS INTO THE LUNGS TWICE DAILY (IN THE MORNING AND IN THE EVENING) 10.2 g 0   carvedilol (COREG) 3.125 MG tablet TAKE 1 TABLET (3.125 MG TOTAL) BY MOUTH 2 (TWO) TIMES DAILY WITH A MEAL. 180 tablet 3   Cyanocobalamin (B-12) 2500 MCG SUBL Place 2,500 mcg under the tongue daily.     digoxin (LANOXIN) 0.125 MG tablet TAKE 1 TABLET (0.125 MG TOTAL) BY MOUTH DAILY. 90 tablet 3   finasteride (PROSCAR) 5 MG tablet Take 5 mg by mouth daily.     furosemide (LASIX) 40 MG tablet Take 1 tablet (40 mg total) by mouth daily. 90 tablet 1  latanoprost (XALATAN) 0.005 % ophthalmic solution Place 1 drop into both eyes daily.  11   prochlorperazine (COMPAZINE) 10 MG tablet Take 1 tablet (10 mg total) by mouth every 6 (six) hours as needed for nausea or vomiting. 30 tablet 0   rosuvastatin (CRESTOR) 40 MG tablet TAKE ONE TABLET BY MOUTH EVERY DAY 90 tablet 3   spironolactone (ALDACTONE) 25 MG tablet TAKE 0.5 TABLETS (12.5 MG TOTAL) BY MOUTH EVERY OTHER DAY. 45 tablet 1   tamsulosin (FLOMAX) 0.4 MG CAPS capsule Take 0.4 mg by mouth at bedtime.     warfarin (COUMADIN) 5 MG tablet MONDAY -  THURSDAY TAKE ONE TAB DAILY AND FRIDAY - SUNDAY TAKE 1.5 TABS DAILY 45 tablet 0   No current facility-administered medications for this visit.    SURGICAL HISTORY:  Past Surgical History:  Procedure Laterality Date   Arm Surgery     BRONCHIAL BIOPSY  12/27/2021   Procedure: BRONCHIAL BIOPSIES;  Surgeon: Garner Nash, DO;  Location: [facility];  Service: Pulmonary;;   BRONCHIAL NEEDLE ASPIRATION BIOPSY  12/27/2021   Procedure: BRONCHIAL NEEDLE ASPIRATION BIOPSIES;  Surgeon: Garner Nash, DO;  Location: [facility];  Service: Pulmonary;;   HERNIA REPAIR     IR ANGIO INTRA EXTRACRAN SEL COM CAROTID INNOMINATE UNI L MOD SED  10/21/2018   IR ANGIO VERTEBRAL SEL SUBCLAVIAN INNOMINATE UNI R MOD SED  10/21/2018   IR CT HEAD LTD  10/21/2018   IR PERCUTANEOUS ART THROMBECTOMY/INFUSION INTRACRANIAL INC DIAG ANGIO  10/21/2018   IR THORACENTESIS ASP PLEURAL SPACE W/IMG GUIDE  09/27/2018   RADIOLOGY WITH ANESTHESIA N/A 10/21/2018   Procedure: RADIOLOGY WITH ANESTHESIA;  Surgeon: Luanne Bras, MD;  Location: [facility];  Service: Radiology;  Laterality: N/A;   REPLANTATION THUMB     RIGHT/LEFT HEART CATH AND CORONARY ANGIOGRAPHY N/A 10/01/2018   Procedure: RIGHT/LEFT HEART CATH AND CORONARY ANGIOGRAPHY;  Surgeon: Troy Sine, MD;  Location: [facility];  Service: Cardiovascular;  Laterality: N/A;    REVIEW OF SYSTEMS:  A comprehensive review of systems was negative except for: Constitutional: positive for anorexia, fatigue, and weight loss Musculoskeletal: positive for muscle weakness   PHYSICAL EXAMINATION: General appearance: alert, cooperative, fatigued, and no distress Head: Normocephalic, without obvious abnormality, atraumatic Neck: no adenopathy, no JVD, supple, symmetrical, trachea midline, and thyroid not enlarged, symmetric, no tenderness/mass/nodules Lymph nodes: Cervical, supraclavicular, and axillary nodes normal. Resp: clear to auscultation bilaterally Back: symmetric, no  curvature. ROM normal. No CVA tenderness. Cardio: regular rate and rhythm, S1, S2 normal, no murmur, click, rub or gallop GI: soft, non-tender; bowel sounds normal; no masses,  no organomegaly Extremities: extremities normal, atraumatic, no cyanosis or edema  ECOG PERFORMANCE STATUS: 1 - Symptomatic but completely ambulatory  Blood pressure (!) 111/45, pulse (!) 119, temperature 98.1 F (36.7 C), temperature source Oral, resp. rate 16, weight 128 lb 4.8 oz (58.2 kg), SpO2 99 %.  LABORATORY DATA: Lab Results  Component Value Date   WBC 11.0 (H) 02/20/2022   HGB 14.0 02/20/2022   HCT 40.8 02/20/2022   MCV 83.8 02/20/2022   PLT 379 02/20/2022      Chemistry      Component Value Date/Time   NA 133 (L) 02/20/2022 1352   NA 138 06/01/2021 1239   K 3.5 02/20/2022 1352   CL 94 (L) 02/20/2022 1352   CO2 31 02/20/2022 1352   BUN 15 02/20/2022 1352   BUN 17 06/01/2021 1239   CREATININE 1.26 (H) 02/20/2022 1352  Component Value Date/Time   CALCIUM 9.9 02/20/2022 1352   ALKPHOS 68 02/20/2022 1352   AST 22 02/20/2022 1352   ALT 22 02/20/2022 1352   BILITOT 0.5 02/20/2022 1352       RADIOGRAPHIC STUDIES No results found.   ASSESSMENT AND PLAN: This is a very pleasant 71 years old African-American male with recurrent non-small cell lung cancer, adenocarcinoma diagnosed in August 2023.  The patient has a history of a stage IIIa non-small cell lung cancer, adenocarcinoma status post a course of concurrent chemoradiation in [place] in 2017. The patient has been on observation since his treatment in 2017. He had repeat CT scan of the chest performed recently. His scan showed stable appearance of the treatment changes involving the right hemothorax but there was progressive soft tissue density in the right upper lobe medially adjacent to the aortic arch and SVC worrisome for possible neoplastic process. The patient had a PET scan performed recently and that showed the progressive  soft tissue density in the medial right upper lobe adjacent to the ascending aorta and SVC on recent CT scan is markedly hypermetabolic consistent with disease recurrence.  There was low-level FDG accumulation in the subcarinal station may reflect hypermetabolic nodal metastasis but no evidence of hypermetabolic metastatic disease in the neck, abdomen or pelvis. The patient underwent repeat bronchoscopy under the care of Dr. Valeta Harms and the final pathology was consistent with recurrent adenocarcinoma of the lung. The patient is currently undergoing a course of concurrent chemoradiation with weekly carboplatin for AUC of 2 and paclitaxel 45 Mg/M2 status post 5 cycles.  He has been tolerating this treatment well with no concerning adverse effects.  He started the concurrent radiotherapy last week. I recommended for him to proceed with cycle #6 today as planned. For the malnutrition, I will refer the patient to the dietitian at the cancer center for evaluation and management of his poor nutritional status. I will see the patient back for follow-up visit in 2 weeks for evaluation. He was advised to call immediately if he has any other concerning symptoms in the interval. The patient voices understanding of current disease status and treatment options and is in agreement with the current care plan. All questions were answered. The patient knows to call the clinic with any problems, questions or concerns. We can certainly see the patient much sooner if necessary.  Disclaimer: This note was dictated with voice recognition software. Similar sounding words can inadvertently be transcribed and may not be corrected upon review.

## 2022-02-21 ENCOUNTER — Other Ambulatory Visit: Payer: Self-pay

## 2022-02-21 ENCOUNTER — Ambulatory Visit
Admission: RE | Admit: 2022-02-21 | Discharge: 2022-02-21 | Disposition: A | Discharge status: Home / Self Care | Payer: Medicare Other | Source: Ambulatory Visit | Attending: Radiation Oncology | Admitting: Radiation Oncology

## 2022-02-21 DIAGNOSIS — Z510 Encounter for antineoplastic radiation therapy: Secondary | ICD-10-CM | POA: Diagnosis not present

## 2022-02-21 DIAGNOSIS — C3411 Malignant neoplasm of upper lobe, right bronchus or lung: Secondary | ICD-10-CM | POA: Diagnosis not present

## 2022-02-21 DIAGNOSIS — Z87891 Personal history of nicotine dependence: Secondary | ICD-10-CM | POA: Diagnosis not present

## 2022-02-21 DIAGNOSIS — Z5111 Encounter for antineoplastic chemotherapy: Secondary | ICD-10-CM | POA: Diagnosis not present

## 2022-02-21 LAB — RAD ONC ARIA SESSION SUMMARY
Course Elapsed Days: 5
Plan Fractions Treated to Date: 4
Plan Prescribed Dose Per Fraction: 1.8 Gy
Plan Total Fractions Prescribed: 33
Plan Total Prescribed Dose: 59.4 Gy
Reference Point Dosage Given to Date: 7.2 Gy
Reference Point Session Dosage Given: 1.8 Gy
Session Number: 4

## 2022-02-22 ENCOUNTER — Ambulatory Visit: Payer: Self-pay

## 2022-02-22 ENCOUNTER — Other Ambulatory Visit: Payer: Medicare Other | Admitting: Licensed Clinical Social Worker

## 2022-02-22 ENCOUNTER — Inpatient Hospital Stay: Payer: Medicare Other | Admitting: Licensed Clinical Social Worker

## 2022-02-22 ENCOUNTER — Ambulatory Visit
Admission: RE | Admit: 2022-02-22 | Discharge: 2022-02-22 | Disposition: A | Discharge status: Home / Self Care | Payer: Medicare Other | Source: Ambulatory Visit | Attending: Radiation Oncology | Admitting: Radiation Oncology

## 2022-02-22 ENCOUNTER — Other Ambulatory Visit: Payer: Self-pay

## 2022-02-22 DIAGNOSIS — C3411 Malignant neoplasm of upper lobe, right bronchus or lung: Secondary | ICD-10-CM | POA: Diagnosis not present

## 2022-02-22 DIAGNOSIS — Z87891 Personal history of nicotine dependence: Secondary | ICD-10-CM | POA: Diagnosis not present

## 2022-02-22 DIAGNOSIS — Z510 Encounter for antineoplastic radiation therapy: Secondary | ICD-10-CM | POA: Diagnosis not present

## 2022-02-22 DIAGNOSIS — Z5111 Encounter for antineoplastic chemotherapy: Secondary | ICD-10-CM | POA: Diagnosis not present

## 2022-02-22 LAB — RAD ONC ARIA SESSION SUMMARY
Course Elapsed Days: 6
Plan Fractions Treated to Date: 5
Plan Prescribed Dose Per Fraction: 1.8 Gy
Plan Total Fractions Prescribed: 33
Plan Total Prescribed Dose: 59.4 Gy
Reference Point Dosage Given to Date: 9 Gy
Reference Point Session Dosage Given: 1.8 Gy
Session Number: 5

## 2022-02-22 NOTE — Patient Outreach (Signed)
  Care Coordination   02/22/2022 Name: Jared Stevens MRN: [id number] DOB: 1950-10-20   Care Coordination Outreach Attempts:  Telephone call to patient.   Patient states he is tired and has an appointment later today.  He states he wants to try to rest and request call back on another day.    Follow Up Plan:  Additional outreach attempts will be made to offer the patient care coordination information and services.   Encounter Outcome:  Pt. Request to Call Back  Care Coordination Interventions Activated:  No   Care Coordination Interventions:  No, not indicated    [name] [facility] [phone] direct line

## 2022-02-22 NOTE — Progress Notes (Signed)
[facility] CSW Progress Note  Clinical Education officer, museum contacted patient by phone to discuss transportation needs.  He stated [name], Therapist, sports at [facility] has arranged rides for radiation for him last week and this week.  Patient reports being confused about his upcoming appointments.  CSW was informed by [facility] that patient has transportation available through their transit system.  Patient said his daughter, [name], could assist in scheduling transportation online.  CSW left a vm for her at [phone].  Patient agreed to meet with CSW tomorrow, 9/28, after his radiation, to verify he has his upcoming appointments on his calendar.    Jared Pickle Chavela Justiniano, Jared Stevens

## 2022-02-23 ENCOUNTER — Other Ambulatory Visit: Payer: Self-pay

## 2022-02-23 ENCOUNTER — Telehealth: Payer: Self-pay

## 2022-02-23 ENCOUNTER — Telehealth: Payer: Self-pay | Admitting: Nurse Practitioner

## 2022-02-23 ENCOUNTER — Ambulatory Visit: Payer: Medicare Other

## 2022-02-23 ENCOUNTER — Inpatient Hospital Stay: Payer: Medicare Other

## 2022-02-23 ENCOUNTER — Ambulatory Visit: Payer: Medicare Other | Admitting: Nurse Practitioner

## 2022-02-23 ENCOUNTER — Other Ambulatory Visit: Payer: Medicare Other

## 2022-02-23 ENCOUNTER — Ambulatory Visit
Admission: RE | Admit: 2022-02-23 | Discharge: 2022-02-23 | Disposition: A | Discharge status: Home / Self Care | Payer: Medicare Other | Source: Ambulatory Visit | Attending: Radiation Oncology | Admitting: Radiation Oncology

## 2022-02-23 ENCOUNTER — Inpatient Hospital Stay: Payer: Medicare Other | Admitting: Dietician

## 2022-02-23 DIAGNOSIS — Z87891 Personal history of nicotine dependence: Secondary | ICD-10-CM | POA: Diagnosis not present

## 2022-02-23 DIAGNOSIS — I6930 Unspecified sequelae of cerebral infarction: Secondary | ICD-10-CM

## 2022-02-23 DIAGNOSIS — C3411 Malignant neoplasm of upper lobe, right bronchus or lung: Secondary | ICD-10-CM | POA: Diagnosis not present

## 2022-02-23 DIAGNOSIS — Z7901 Long term (current) use of anticoagulants: Secondary | ICD-10-CM

## 2022-02-23 DIAGNOSIS — Z510 Encounter for antineoplastic radiation therapy: Secondary | ICD-10-CM | POA: Diagnosis not present

## 2022-02-23 DIAGNOSIS — Z5111 Encounter for antineoplastic chemotherapy: Secondary | ICD-10-CM | POA: Diagnosis not present

## 2022-02-23 LAB — RAD ONC ARIA SESSION SUMMARY
Course Elapsed Days: 7
Plan Fractions Treated to Date: 6
Plan Prescribed Dose Per Fraction: 1.8 Gy
Plan Total Fractions Prescribed: 33
Plan Total Prescribed Dose: 59.4 Gy
Reference Point Dosage Given to Date: 10.8 Gy
Reference Point Session Dosage Given: 1.8 Gy
Session Number: 6

## 2022-02-23 NOTE — Telephone Encounter (Signed)
Called & left Pt a detailed message

## 2022-02-23 NOTE — Progress Notes (Signed)
[facility] CSW Progress Note  Holiday representative met with patient after he received radiation to discuss his upcoming treatment schedule.  He reports feeling fatigued due to his treatments.  Patient forgot his calendar, so we were unable to enter his appointmentst.  CSW provided patient with contact information.  CSW provided patient's daughter with the phone number to [facility] [phone] to arrange transportation next week.     Rodman Pickle Laqueta Bonaventura, LCSW

## 2022-02-23 NOTE — Telephone Encounter (Signed)
Pt. Here for an INR check pt states that he was not feeling well dihydrated lethargic and could not digest his food well. An appt was schedule for 11:20 am 02/23/22 with his provider but could not stay for appt because he has a previous appt schedule at a different office (radiology) at 12 pm on 02/23/22. Was advise by provider to report his condition at his schedule appt with radiology or go to the ER due to symptoms. Pt has a OV schedule with his provider on 03/15/22. Laurell Roof CMA

## 2022-02-23 NOTE — Progress Notes (Signed)
Patient was 40 minutes late to nutrition appointment today. Unable to meet with pt as he is scheduled to see radiation directly after nutrition. Appointment rescheduled for Monday, October 2 during infusion.

## 2022-02-23 NOTE — Progress Notes (Signed)
Hold coumadin dose on Friday, then resume med as previously prescribed. F/up in 2weeks for INR check

## 2022-02-23 NOTE — Progress Notes (Signed)
Per orders of Wilfred Lacy, NP, Pt is here for INR check. Capillary stick done on RT middle finger, done by Armandina Gemma, CMA. Pt tolerated stick well.    Goal INR = 2.0-3.0   Last INR = 02/09/22   Pt currently takes Coumadin  Take 5mg  Mon-Wed and 7.5mg  Thurs-Sun.   Date of last Coumadin dose = 02/23/22   Pt denies recent antibiotics, no dietary changes and no unusual bruising / bleeding.   INR today = 3.9   INR not at goal   Advised to send nurse note to provider to get any changes in Coumadin.  Dm/cma

## 2022-02-24 ENCOUNTER — Other Ambulatory Visit: Payer: Self-pay

## 2022-02-24 ENCOUNTER — Ambulatory Visit
Admission: RE | Admit: 2022-02-24 | Discharge: 2022-02-24 | Disposition: A | Discharge status: Home / Self Care | Payer: Medicare Other | Source: Ambulatory Visit | Attending: Radiation Oncology | Admitting: Radiation Oncology

## 2022-02-24 ENCOUNTER — Inpatient Hospital Stay: Payer: Medicare Other

## 2022-02-24 ENCOUNTER — Ambulatory Visit: Payer: Self-pay

## 2022-02-24 DIAGNOSIS — Z510 Encounter for antineoplastic radiation therapy: Secondary | ICD-10-CM | POA: Diagnosis not present

## 2022-02-24 DIAGNOSIS — Z87891 Personal history of nicotine dependence: Secondary | ICD-10-CM | POA: Diagnosis not present

## 2022-02-24 DIAGNOSIS — C3411 Malignant neoplasm of upper lobe, right bronchus or lung: Secondary | ICD-10-CM | POA: Diagnosis not present

## 2022-02-24 DIAGNOSIS — Z5111 Encounter for antineoplastic chemotherapy: Secondary | ICD-10-CM | POA: Diagnosis not present

## 2022-02-24 LAB — RAD ONC ARIA SESSION SUMMARY
Course Elapsed Days: 8
Plan Fractions Treated to Date: 7
Plan Prescribed Dose Per Fraction: 1.8 Gy
Plan Total Fractions Prescribed: 33
Plan Total Prescribed Dose: 59.4 Gy
Reference Point Dosage Given to Date: 12.6 Gy
Reference Point Session Dosage Given: 1.8 Gy
Session Number: 7

## 2022-02-24 MED FILL — Dexamethasone Sodium Phosphate Inj 100 MG/10ML: INTRAMUSCULAR | Qty: 1 | Status: AC

## 2022-02-24 NOTE — Progress Notes (Signed)
[facility] CSW Progress Note  Clinical Education officer, museum contacted caregiver by phone to discuss transportation needs. Provided information to patient's daughter, [name], regarding patient's upcoming treatment dates and times.  She plans on contacting [facility] to schedule transportation.  CSW encouraged her to call with any further questions.  She said she understood.    Rodman Pickle Pamula Luther, LCSW

## 2022-02-24 NOTE — Patient Outreach (Signed)
  Care Coordination   Follow Up Visit Note   02/24/2022 Name: Jared Stevens MRN: [id number] DOB: [birth year]  Jared Stevens is a 71 y.o. year old male who sees [name], [name], NP for primary care. I spoke with  Jared Stevens by phone today.  What matters to the patients health and wellness today?  Patient states he continues to have fatigue due to having his treatments.  He states he is not able to complete telephone call with Jared Stevens today because he is leaving to go have his radiation/ chemo treatment.  Patient agreeable to follow up call on another day.     Goals Addressed             This Visit's Progress    Patient Stated: Continue to work with care coordinator to learn skills to manage my health conditions       Care Coordination Interventions: Evaluation of current treatment plan related to Lung cancer         SDOH assessments and interventions completed:  No     Care Coordination Interventions Activated:  Yes  Care Coordination Interventions:  Yes, provided   Follow up plan: Follow up call scheduled for 03/13/22 at 11:30 am    Encounter Outcome:  Pt. Visit Completed   Jared Plowman RN,BSN,CCM [place] [phone] direct line

## 2022-02-26 ENCOUNTER — Inpatient Hospital Stay (HOSPITAL_COMMUNITY)
Admission: EM | Admit: 2022-02-26 | Discharge: 2022-03-04 | DRG: 641 | Disposition: A | Discharge status: Skilled Nursing Facility | Payer: Medicare Other | Attending: Internal Medicine | Admitting: Internal Medicine

## 2022-02-26 ENCOUNTER — Encounter (HOSPITAL_COMMUNITY): Payer: Self-pay | Admitting: Emergency Medicine

## 2022-02-26 DIAGNOSIS — I255 Ischemic cardiomyopathy: Secondary | ICD-10-CM | POA: Diagnosis not present

## 2022-02-26 DIAGNOSIS — Z8673 Personal history of transient ischemic attack (TIA), and cerebral infarction without residual deficits: Secondary | ICD-10-CM | POA: Diagnosis not present

## 2022-02-26 DIAGNOSIS — Z7951 Long term (current) use of inhaled steroids: Secondary | ICD-10-CM | POA: Diagnosis not present

## 2022-02-26 DIAGNOSIS — Z7901 Long term (current) use of anticoagulants: Secondary | ICD-10-CM

## 2022-02-26 DIAGNOSIS — Z515 Encounter for palliative care: Secondary | ICD-10-CM

## 2022-02-26 DIAGNOSIS — N179 Acute kidney failure, unspecified: Secondary | ICD-10-CM

## 2022-02-26 DIAGNOSIS — Z923 Personal history of irradiation: Secondary | ICD-10-CM | POA: Diagnosis not present

## 2022-02-26 DIAGNOSIS — I428 Other cardiomyopathies: Secondary | ICD-10-CM | POA: Diagnosis present

## 2022-02-26 DIAGNOSIS — I2510 Atherosclerotic heart disease of native coronary artery without angina pectoris: Secondary | ICD-10-CM | POA: Diagnosis not present

## 2022-02-26 DIAGNOSIS — R791 Abnormal coagulation profile: Secondary | ICD-10-CM | POA: Diagnosis not present

## 2022-02-26 DIAGNOSIS — I5042 Chronic combined systolic (congestive) and diastolic (congestive) heart failure: Secondary | ICD-10-CM | POA: Diagnosis present

## 2022-02-26 DIAGNOSIS — C3491 Malignant neoplasm of unspecified part of right bronchus or lung: Secondary | ICD-10-CM | POA: Diagnosis present

## 2022-02-26 DIAGNOSIS — E860 Dehydration: Secondary | ICD-10-CM | POA: Diagnosis present

## 2022-02-26 DIAGNOSIS — E1165 Type 2 diabetes mellitus with hyperglycemia: Secondary | ICD-10-CM | POA: Diagnosis not present

## 2022-02-26 DIAGNOSIS — R64 Cachexia: Secondary | ICD-10-CM | POA: Diagnosis not present

## 2022-02-26 DIAGNOSIS — Z510 Encounter for antineoplastic radiation therapy: Secondary | ICD-10-CM | POA: Diagnosis not present

## 2022-02-26 DIAGNOSIS — E876 Hypokalemia: Secondary | ICD-10-CM

## 2022-02-26 DIAGNOSIS — E7800 Pure hypercholesterolemia, unspecified: Secondary | ICD-10-CM | POA: Diagnosis present

## 2022-02-26 DIAGNOSIS — J449 Chronic obstructive pulmonary disease, unspecified: Secondary | ICD-10-CM | POA: Diagnosis present

## 2022-02-26 DIAGNOSIS — Z87891 Personal history of nicotine dependence: Secondary | ICD-10-CM

## 2022-02-26 DIAGNOSIS — Z9221 Personal history of antineoplastic chemotherapy: Secondary | ICD-10-CM | POA: Diagnosis not present

## 2022-02-26 DIAGNOSIS — C3411 Malignant neoplasm of upper lobe, right bronchus or lung: Secondary | ICD-10-CM | POA: Diagnosis not present

## 2022-02-26 DIAGNOSIS — C3490 Malignant neoplasm of unspecified part of unspecified bronchus or lung: Secondary | ICD-10-CM | POA: Diagnosis not present

## 2022-02-26 DIAGNOSIS — Z7189 Other specified counseling: Secondary | ICD-10-CM | POA: Diagnosis not present

## 2022-02-26 DIAGNOSIS — R627 Adult failure to thrive: Secondary | ICD-10-CM | POA: Diagnosis not present

## 2022-02-26 DIAGNOSIS — Z79899 Other long term (current) drug therapy: Secondary | ICD-10-CM

## 2022-02-26 DIAGNOSIS — R338 Other retention of urine: Secondary | ICD-10-CM | POA: Diagnosis not present

## 2022-02-26 DIAGNOSIS — Z681 Body mass index (BMI) 19 or less, adult: Secondary | ICD-10-CM | POA: Diagnosis not present

## 2022-02-26 DIAGNOSIS — R531 Weakness: Secondary | ICD-10-CM | POA: Diagnosis not present

## 2022-02-26 NOTE — ED Triage Notes (Signed)
Pt here from home with c/o weakness and falls , still receiving chemo and radiation , is on coumadin

## 2022-02-26 NOTE — ED Provider Triage Note (Signed)
Emergency Medicine Provider Triage Evaluation Note  Carthel Castille , a 71 y.o. male  was evaluated in triage.  Pt complains of weakness for several days. States he is having a hard time keeping food down. Feels dehydrated. States he fell earlier, only hit his knees. Is on coumadin, but did not hit his head.   Review of Systems  Positive: As above Negative: Nausea, vomiting, CP, headache  Physical Exam  BP 91/73   Pulse (!) 113   Temp (!) 97.3 F (36.3 C) (Oral)   Resp 15   SpO2 97%  Gen:   Awake, no distress   Resp:  Normal effort  MSK:   Moves extremities without difficulty  Other:    Medical Decision Making  Medically screening exam initiated at 11:54 PM.  Appropriate orders placed.  Eleanor Dimichele was informed that the remainder of the evaluation will be completed by another provider, this initial triage assessment does not replace that evaluation, and the importance of remaining in the ED until their evaluation is complete.     Anastasya Jewell T, PA-C 02/26/22 2354

## 2022-02-27 ENCOUNTER — Inpatient Hospital Stay: Payer: Medicare Other

## 2022-02-27 ENCOUNTER — Other Ambulatory Visit: Payer: Self-pay

## 2022-02-27 ENCOUNTER — Ambulatory Visit
Admission: RE | Admit: 2022-02-27 | Discharge: 2022-02-27 | Disposition: A | Discharge status: Home / Self Care | Payer: Medicare Other | Source: Ambulatory Visit | Attending: Radiation Oncology | Admitting: Radiation Oncology

## 2022-02-27 ENCOUNTER — Encounter (HOSPITAL_COMMUNITY): Payer: Self-pay | Admitting: Internal Medicine

## 2022-02-27 ENCOUNTER — Inpatient Hospital Stay: Payer: Medicare Other | Attending: Internal Medicine

## 2022-02-27 ENCOUNTER — Emergency Department (HOSPITAL_COMMUNITY): Payer: Medicare Other

## 2022-02-27 DIAGNOSIS — R627 Adult failure to thrive: Secondary | ICD-10-CM

## 2022-02-27 DIAGNOSIS — I5042 Chronic combined systolic (congestive) and diastolic (congestive) heart failure: Secondary | ICD-10-CM

## 2022-02-27 DIAGNOSIS — E876 Hypokalemia: Secondary | ICD-10-CM

## 2022-02-27 DIAGNOSIS — Z87891 Personal history of nicotine dependence: Secondary | ICD-10-CM | POA: Diagnosis not present

## 2022-02-27 DIAGNOSIS — N179 Acute kidney failure, unspecified: Secondary | ICD-10-CM | POA: Diagnosis not present

## 2022-02-27 DIAGNOSIS — Z5111 Encounter for antineoplastic chemotherapy: Secondary | ICD-10-CM | POA: Insufficient documentation

## 2022-02-27 DIAGNOSIS — Z510 Encounter for antineoplastic radiation therapy: Secondary | ICD-10-CM | POA: Insufficient documentation

## 2022-02-27 DIAGNOSIS — R791 Abnormal coagulation profile: Secondary | ICD-10-CM

## 2022-02-27 DIAGNOSIS — C3411 Malignant neoplasm of upper lobe, right bronchus or lung: Secondary | ICD-10-CM | POA: Insufficient documentation

## 2022-02-27 DIAGNOSIS — E1165 Type 2 diabetes mellitus with hyperglycemia: Secondary | ICD-10-CM

## 2022-02-27 DIAGNOSIS — C3491 Malignant neoplasm of unspecified part of right bronchus or lung: Secondary | ICD-10-CM

## 2022-02-27 LAB — URINALYSIS, ROUTINE W REFLEX MICROSCOPIC
Bilirubin Urine: NEGATIVE
Glucose, UA: NEGATIVE mg/dL
Ketones, ur: NEGATIVE mg/dL
Leukocytes,Ua: NEGATIVE
Nitrite: POSITIVE — AB
Protein, ur: 100 mg/dL — AB
Specific Gravity, Urine: 1.014 (ref 1.005–1.030)
pH: 5 (ref 5.0–8.0)

## 2022-02-27 LAB — GLUCOSE, CAPILLARY
Glucose-Capillary: 141 mg/dL — ABNORMAL HIGH (ref 70–99)
Glucose-Capillary: 104 mg/dL — ABNORMAL HIGH (ref 70–99)

## 2022-02-27 LAB — HEPATIC FUNCTION PANEL
ALT: 15 U/L (ref 0–44)
AST: 18 U/L (ref 15–41)
Albumin: <1.5 g/dL — ABNORMAL LOW (ref 3.5–5.0)
Alkaline Phosphatase: 46 U/L (ref 38–126)
Bilirubin, Direct: 0.3 mg/dL — ABNORMAL HIGH (ref 0.0–0.2)
Indirect Bilirubin: 0.3 mg/dL (ref 0.3–0.9)
Total Bilirubin: 0.6 mg/dL (ref 0.3–1.2)
Total Protein: 4.4 g/dL — ABNORMAL LOW (ref 6.5–8.1)

## 2022-02-27 LAB — RAD ONC ARIA SESSION SUMMARY
Course Elapsed Days: 11
Plan Fractions Treated to Date: 8
Plan Prescribed Dose Per Fraction: 1.8 Gy
Plan Total Fractions Prescribed: 33
Plan Total Prescribed Dose: 59.4 Gy
Reference Point Dosage Given to Date: 14.4 Gy
Reference Point Session Dosage Given: 1.8 Gy
Session Number: 8

## 2022-02-27 LAB — CBC
HCT: 41.8 % (ref 39.0–52.0)
Hemoglobin: 13.9 g/dL (ref 13.0–17.0)
MCH: 27.9 pg (ref 26.0–34.0)
MCHC: 33.3 g/dL (ref 30.0–36.0)
MCV: 83.9 fL (ref 80.0–100.0)
Platelets: 535 10*3/uL — ABNORMAL HIGH (ref 150–400)
RBC: 4.98 MIL/uL (ref 4.22–5.81)
RDW: 17.1 % — ABNORMAL HIGH (ref 11.5–15.5)
WBC: 7.8 10*3/uL (ref 4.0–10.5)
nRBC: 0 % (ref 0.0–0.2)

## 2022-02-27 LAB — TROPONIN I (HIGH SENSITIVITY)
Troponin I (High Sensitivity): 31 ng/L — ABNORMAL HIGH (ref <18)
Troponin I (High Sensitivity): 20 ng/L — ABNORMAL HIGH (ref <18)

## 2022-02-27 LAB — BASIC METABOLIC PANEL
Anion gap: 14 (ref 5–15)
BUN: 25 mg/dL — ABNORMAL HIGH (ref 8–23)
CO2: 30 mmol/L (ref 22–32)
Calcium: 8.8 mg/dL — ABNORMAL LOW (ref 8.9–10.3)
Chloride: 89 mmol/L — ABNORMAL LOW (ref 98–111)
Creatinine, Ser: 1.57 mg/dL — ABNORMAL HIGH (ref 0.61–1.24)
GFR, Estimated: 47 mL/min — ABNORMAL LOW (ref >60)
Glucose, Bld: 156 mg/dL — ABNORMAL HIGH (ref 70–99)
Potassium: 3 mmol/L — ABNORMAL LOW (ref 3.5–5.1)
Sodium: 133 mmol/L — ABNORMAL LOW (ref 135–145)

## 2022-02-27 LAB — PROTIME-INR
INR: 4.4 (ref 0.8–1.2)
Prothrombin Time: 41.4 seconds — ABNORMAL HIGH (ref 11.4–15.2)

## 2022-02-27 LAB — PREALBUMIN: Prealbumin: 6 mg/dL — ABNORMAL LOW (ref 18–38)

## 2022-02-27 LAB — CBG MONITORING, ED
Glucose-Capillary: 155 mg/dL — ABNORMAL HIGH (ref 70–99)
Glucose-Capillary: 175 mg/dL — ABNORMAL HIGH (ref 70–99)

## 2022-02-27 LAB — HEMOGLOBIN A1C
Hgb A1c MFr Bld: 5.9 % — ABNORMAL HIGH (ref 4.8–5.6)
Mean Plasma Glucose: 122.63 mg/dL

## 2022-02-27 LAB — DIGOXIN LEVEL: Digoxin Level: <0.2 ng/mL — ABNORMAL LOW (ref 0.8–2.0)

## 2022-02-27 MED ORDER — ACETAMINOPHEN 650 MG RE SUPP: 650.0000 mg | Freq: Four times a day (QID) | RECTAL | Status: DC | PRN

## 2022-02-27 MED ORDER — INSULIN ASPART 100 UNIT/ML IJ SOLN
0.0000 [IU] | Freq: Every day | INTRAMUSCULAR | Status: DC
  Filled 2022-02-27: qty 0.05

## 2022-02-27 MED ORDER — SODIUM CHLORIDE 0.9% FLUSH
3.0000 mL | Freq: Two times a day (BID) | INTRAVENOUS | Status: DC
  Administered 2022-02-27 – 2022-03-04 (×6): 3 mL via INTRAVENOUS

## 2022-02-27 MED ORDER — ACETAMINOPHEN 325 MG PO TABS
650.0000 mg | ORAL_TABLET | ORAL | Status: DC | PRN
  Administered 2022-03-02 (×2): 650 mg via ORAL
  Filled 2022-02-27 (×2): qty 2

## 2022-02-27 MED ORDER — ACETAMINOPHEN 650 MG RE SUPP: 650.0000 mg | RECTAL | Status: DC | PRN

## 2022-02-27 MED ORDER — SODIUM CHLORIDE 0.9 % IV BOLUS
1000.0000 mL | Freq: Once | INTRAVENOUS | Status: AC
  Administered 2022-02-27: 1000 mL via INTRAVENOUS

## 2022-02-27 MED ORDER — ACETAMINOPHEN 325 MG PO TABS: 650.0000 mg | ORAL_TABLET | Freq: Four times a day (QID) | ORAL | Status: DC | PRN

## 2022-02-27 MED ORDER — OXYCODONE HCL 5 MG PO TABS
5.0000 mg | ORAL_TABLET | ORAL | Status: DC | PRN
  Administered 2022-03-02 – 2022-03-04 (×7): 5 mg via ORAL
  Filled 2022-02-27 (×7): qty 1

## 2022-02-27 MED ORDER — ENOXAPARIN SODIUM 40 MG/0.4ML IJ SOSY: 40.0000 mg | PREFILLED_SYRINGE | INTRAMUSCULAR | Status: DC

## 2022-02-27 MED ORDER — INSULIN ASPART 100 UNIT/ML IJ SOLN
0.0000 [IU] | Freq: Three times a day (TID) | INTRAMUSCULAR | Status: DC
  Administered 2022-02-28 – 2022-03-02 (×5): 1 [IU] via SUBCUTANEOUS
  Filled 2022-02-27: qty 0.06

## 2022-02-27 MED ORDER — POTASSIUM CHLORIDE 10 MEQ/100ML IV SOLN
10.0000 meq | INTRAVENOUS | Status: AC
  Administered 2022-02-27 (×4): 10 meq via INTRAVENOUS
  Filled 2022-02-27 (×4): qty 100

## 2022-02-27 MED ORDER — ONDANSETRON 8 MG PO TBDP: ORAL_TABLET | ORAL | 0 refills | Status: DC

## 2022-02-27 NOTE — ED Notes (Signed)
Pt. CBG 155, RN, [place] made aware.

## 2022-02-27 NOTE — Assessment & Plan Note (Signed)
-   Replete as needed 

## 2022-02-27 NOTE — ED Notes (Signed)
Transported to [facility]

## 2022-02-27 NOTE — Assessment & Plan Note (Addendum)
-   labile INR - on coumadin due to his MCA stroke s/p thrombectomy - likely can convert to a DOAC however awaiting palliative discussions in case transitions to comfort, would d/c all together

## 2022-02-27 NOTE — Assessment & Plan Note (Addendum)
-   A1c 5.9% - Continue SSI and CBG monitoring

## 2022-02-27 NOTE — Subjective & Objective (Signed)
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  Patient underwent his sixth round of chemotherapy along with concurrent radiation.  He has continued to complain of feeling weak, having anorexia and not being able to eat.  He states he continues to lose weight.  Denies any fever or chills.  He states his falls from feeling so weak he cannot stand up.  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  Status post thrombectomy by neuro interventional radiology.  He was placed on Coumadin at that time.  Unclear why he was never switched over to a DOAC instead of Coumadin.  Patient has very labile INRs since starting back on chemotherapy.  On arrival temp 97.3 heart rate 113 blood pressure 91/73  Labs showed an INR 4.4  Sodium 133, potassium 3.0, BUN of 25, creatinine 1.5,  Hepatic panel and prealbumin have been added on.  Chest x-ray demonstrates continued lung mass on the right side  Patient given 1 L IV fluids.  His blood pressure improved to 122/109.  Room air saturations stayed 95%.  EDP attempted to discharge patient to home.  Patient refused to be discharged.  [facility] hospitalist contacted for admission.

## 2022-02-27 NOTE — Assessment & Plan Note (Addendum)
-   Recurrent non-small cell lung cancer adenocarcinoma initially diagnosed 2017.  Found to have recurrence in July 2023 on PET scan. -Patient endorses poor appetite at home with ongoing weight loss and progressive weakness; he has been started on chemoradiation -See failure to thrive -Follow-up palliative care discussions

## 2022-02-27 NOTE — Hospital Course (Signed)
Jared Stevens is a 71 yo male with PMH recurrent NSCLC (RUL), chronic combined systolic/diastolic CHF, DMII who presented to the ER with ongoing weakness and weight loss. He was recently started back on chemotherapy and radiation after being diagnosed with recurrent lung cancer found on PET scan in July 2023. He continues to have ongoing weight loss and weakness.  His quality of life has been declining.  He was attempted to be discharged home from the ER but refused discharge due to how weak and poorly he felt. Further issues found on work-up were a labile INR which was elevated.  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  Palliative care was consulted on admission.  His daughter was also informed of his ongoing functional decline at home and that he may warrant further goals of care discussions given his progressive decline.

## 2022-02-27 NOTE — Assessment & Plan Note (Addendum)
-   Suspected due to his ongoing poor intake and weight loss - He was given fluid resuscitation in the ER as well.  However in setting of underlying chronic combined systolic/diastolic CHF, holding off on further fluids.  This also speaks towards poor prognosis - Continue trending renal function

## 2022-02-27 NOTE — ED Notes (Signed)
Patient returned from [facility]

## 2022-02-27 NOTE — H&P (Signed)
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  Patient underwent his sixth round of chemotherapy along with concurrent radiation.  He has continued to complain of feeling weak, having anorexia and not being able to eat.  He states he continues to lose weight.  Denies any fever or chills.  He states his falls from feeling so weak he cannot stand up.  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  Status post thrombectomy by neuro interventional radiology.  He was placed on Coumadin at that time.  Unclear why he was never switched over to a DOAC instead of Coumadin.  Patient has very labile INRs since starting back on chemotherapy.  On arrival temp 97.3 heart rate 113 blood pressure 91/73  Labs showed an INR 4.4  Sodium 133, potassium 3.0, BUN of 25, creatinine 1.5,  Hepatic panel and prealbumin have been added on.  Chest x-ray demonstrates continued lung mass on the right side  Patient given 1 L IV fluids.  His blood pressure improved to 122/109.  Room air saturations stayed 95%.  EDP attempted to discharge patient to home.  Patient refused to be discharged.  [facility] hospitalist contacted for admission.   ED Course: given 1 L IVF. INR 4.4, pt refused to be discharged.  Review of Systems:  Review of Systems  Constitutional:  Positive for malaise/fatigue and weight loss. Negative for chills and fever.  HENT: Negative.    Eyes: Negative.   Respiratory: Negative.    Cardiovascular: Negative.    Gastrointestinal: Negative.   Genitourinary: Negative.   Musculoskeletal: Negative.   Skin: Negative.   Neurological:  Positive for weakness.  Endo/Heme/Allergies:        Anorexia  Psychiatric/Behavioral: Negative.    All other systems reviewed and are negative.   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   Tx with tPA and thrombectomy // Coumadin (managed by PCP)   Hx of recurrent R pleural effusion    s/p thoracentesis   Hypercholesteremia    Long term (current) use of anticoagulants 01/08/2020   Lung cancer ([place]) 03/2016   Mixed Ischemic and Non-Ischemic Cardiomyopathy    Non-small cell carcinoma of lung    Nonsustained ventricular tachycardia    Tobacco use disorder, continuous 09/27/2018    Past Surgical History:  Procedure Laterality Date   Arm Surgery     BRONCHIAL BIOPSY  12/27/2021   Procedure: BRONCHIAL BIOPSIES;  Surgeon: Garner Nash, DO;  Location: [facility];  Service: Pulmonary;;   BRONCHIAL NEEDLE ASPIRATION BIOPSY  12/27/2021   Procedure: BRONCHIAL NEEDLE ASPIRATION BIOPSIES;  Surgeon: Garner Nash, DO;  Location: [place];  Service: Pulmonary;;   HERNIA REPAIR     IR ANGIO INTRA EXTRACRAN SEL COM CAROTID INNOMINATE UNI L MOD SED  10/21/2018   IR ANGIO VERTEBRAL SEL SUBCLAVIAN INNOMINATE UNI R MOD SED  10/21/2018   IR CT HEAD LTD  10/21/2018   IR PERCUTANEOUS ART THROMBECTOMY/INFUSION INTRACRANIAL INC DIAG ANGIO  10/21/2018   IR THORACENTESIS ASP  PLEURAL SPACE W/IMG GUIDE  09/27/2018   RADIOLOGY WITH ANESTHESIA N/A 10/21/2018   Procedure: RADIOLOGY WITH ANESTHESIA;  Surgeon: Luanne Bras, MD;  Location: [place];  Service: Radiology;  Laterality: N/A;   REPLANTATION THUMB     RIGHT/LEFT HEART CATH AND CORONARY ANGIOGRAPHY N/A 10/01/2018   Procedure: RIGHT/LEFT HEART CATH AND CORONARY ANGIOGRAPHY;  Surgeon: Troy Sine, MD;  Location: [place] [facility];  Service: Cardiovascular;  Laterality: N/A;     reports that he quit smoking about 5 months ago. His smoking use included cigarettes. He started smoking about 54 years ago. He has a 41.25 pack-year smoking history. He has never used smokeless tobacco. He reports that he does not currently use alcohol after a past usage of about 2.0 standard drinks of alcohol per week. He reports that he does not currently use drugs.  No Known Allergies  Family History  Problem Relation Age of Onset   Aneurysm Mother    Colon polyps Neg Hx    Colon cancer Neg Hx     Prior to Admission medications   Medication Sig Start Date End Date Taking? Authorizing Provider  albuterol (VENTOLIN HFA) 108 (90 Base) MCG/ACT inhaler Inhale 1-2 puffs into the lungs every 6 (six) hours as needed for wheezing or shortness of breath. Patient states he takes only as needed. 05/13/21  Yes [name], [name], NP  budesonide-formoterol (SYMBICORT) 160-4.5 MCG/ACT inhaler INHALE TWO PUFFS INTO THE LUNGS TWICE DAILY (IN THE MORNING AND IN THE EVENING) Patient taking differently: Inhale 2 puffs into the lungs in the morning and at bedtime. 02/20/22  Yes [name], [name], NP  carvedilol (COREG) 3.125 MG tablet TAKE 1 TABLET (3.125 MG TOTAL) BY MOUTH 2 (TWO) TIMES DAILY WITH A MEAL. 10/07/21  Yes [name], [name], NP  digoxin (LANOXIN) 0.125 MG tablet TAKE 1 TABLET (0.125 MG TOTAL) BY MOUTH DAILY. 10/07/21  Yes [name], [name], NP  finasteride (PROSCAR) 5 MG tablet Take 5 mg by mouth daily. 12/07/20  Yes [provider]  furosemide (LASIX) 40 MG tablet Take 1 tablet (40 mg total) by mouth daily. 01/18/22  Yes Nahser, Wonda Cheng, MD  latanoprost (XALATAN) 0.005 % ophthalmic solution Place 1 drop into both eyes daily. 03/12/18  Yes [provider]  ondansetron (ZOFRAN-ODT) 8 MG disintegrating tablet 8mg  ODT q4 hours prn nausea 02/27/22  Yes Delo, Nathaneil Canary, MD  rosuvastatin (CRESTOR) 40 MG tablet TAKE ONE TABLET BY MOUTH EVERY DAY 07/08/21  Yes Nahser, Wonda Cheng, MD  spironolactone (ALDACTONE) 25 MG tablet TAKE 0.5 TABLETS (12.5 MG TOTAL) BY MOUTH EVERY OTHER DAY. 06/22/21  Yes [name], [name], NP  tamsulosin (FLOMAX) 0.4 MG CAPS capsule Take 0.4 mg by mouth at bedtime. 11/05/20  Yes [provider]  prochlorperazine (COMPAZINE) 10 MG tablet Take 1 tablet (10 mg total) by mouth every 6 (six) hours as needed for nausea or vomiting. Patient not taking: Reported on 02/27/2022  01/10/22   Curt Bears, MD  warfarin (COUMADIN) 5 MG tablet MONDAY - THURSDAY TAKE ONE TAB DAILY AND FRIDAY - SUNDAY TAKE 1.5 TABS DAILY Patient taking differently: Take 5-7.5 mg by mouth as directed. TAKE 1 TABLET (5 MG) MONDAY THRU THURSDAY & TAKE 1.5 TABLETS (7.5 MG) FRIDAY THRU SUNDAY 02/01/22   [name], [name], NP    Physical Exam: Vitals:   02/27/22 0330 02/27/22 0415 02/27/22 0505 02/27/22 0515  BP: (!) 122/109 99/70 96/69 90/71   Pulse: 97 99 91 92  Resp: 17 20 (!) 22 19  Temp:  98.8 F (37.1 C)    TempSrc:  Oral    SpO2: 95% 95% 95% 94%    Physical Exam Vitals and nursing note reviewed.  Constitutional:      General: He is not in acute distress.    Appearance: He is not toxic-appearing or diaphoretic.     Comments: Thin cachectic African-American male.  Appears older than stated age of 66.  HENT:     Head: Normocephalic and atraumatic.     Nose: Nose normal.  Cardiovascular:     Rate and Rhythm: Normal rate and regular rhythm.  Pulmonary:     Effort: No respiratory distress.     Breath  sounds: Rhonchi present.  Abdominal:     General: Abdomen is flat. Bowel sounds are normal. There is no distension.     Tenderness: There is no abdominal tenderness. There is no guarding or rebound.  Musculoskeletal:     Right lower leg: No edema.     Left lower leg: No edema.  Skin:    General: Skin is warm and dry.     Capillary Refill: Capillary refill takes less than 2 seconds.  Neurological:     Mental Status: He is alert and oriented to person, place, and time.      Labs on Admission: I have personally reviewed following labs and imaging studies  CBC: Recent Labs  Lab 02/20/22 1352 02/27/22 0105  WBC 11.0* 7.8  NEUTROABS 7.9*  --   HGB 14.0 13.9  HCT 40.8 41.8  MCV 83.8 83.9  PLT 379 096*   Basic Metabolic Panel: Recent Labs  Lab 02/20/22 1352 02/27/22 0105  NA 133* 133*  K 3.5 3.0*  CL 94* 89*  CO2 31 30  GLUCOSE 102* 156*  BUN 15 25*  CREATININE 1.26* 1.57*  CALCIUM 9.9 8.8*   GFR: Estimated Creatinine Clearance: 36 mL/min (A) (by C-G formula based on SCr of 1.57 mg/dL (H)). Liver Function Tests: Recent Labs  Lab 02/20/22 1352  AST 22  ALT 22  ALKPHOS 68  BILITOT 0.5  PROT 9.0*  ALBUMIN 3.6   No results for input(s): "LIPASE", "AMYLASE" in the last 168 hours. No results for input(s): "AMMONIA" in the last 168 hours. Coagulation Profile: Recent Labs  Lab 02/27/22 0135  INR 4.4*   Cardiac Enzymes: No results for input(s): "CKTOTAL", "CKMB", "CKMBINDEX", "TROPONINI", "TROPONINIHS" in the last 168 hours. BNP (last 3 results) No results for input(s): "PROBNP" in the last 8760 hours. HbA1C: No results for input(s): "HGBA1C" in the last 72 hours. CBG: Recent Labs  Lab 02/27/22 0056  GLUCAP 155*   Lipid Profile: No results for input(s): "CHOL", "HDL", "LDLCALC", "TRIG", "CHOLHDL", "LDLDIRECT" in the last 72 hours. Thyroid Function Tests: No results for input(s): "TSH", "T4TOTAL", "FREET4", "T3FREE", "THYROIDAB" in the last 72  hours. Anemia Panel: No results for input(s): "VITAMINB12", "FOLATE", "FERRITIN", "TIBC", "IRON", "RETICCTPCT" in the last 72 hours.  Urine analysis:    Component Value Date/Time   COLORURINE YELLOW 02/27/2022 0237   APPEARANCEUR CLOUDY (A) 02/27/2022 0237   LABSPEC 1.014 02/27/2022 0237   PHURINE 5.0 02/27/2022 0237   GLUCOSEU NEGATIVE 02/27/2022 0237   HGBUR MODERATE (A) 02/27/2022 0237   BILIRUBINUR NEGATIVE 02/27/2022 0237   KETONESUR NEGATIVE 02/27/2022 0237   PROTEINUR 100 (A) 02/27/2022 0237   NITRITE POSITIVE (A) 02/27/2022 0237   LEUKOCYTESUR NEGATIVE 02/27/2022 0237    Radiological Exams on Admission: I have personally reviewed images DG Chest Port 1 View  Result Date: 02/27/2022 CLINICAL DATA:  Weakness EXAM: PORTABLE CHEST 1 VIEW COMPARISON:  Chest radiograph dated 12/27/2021. PET-CT dated 11/30/2021. FINDINGS: Masslike opacity along the right paratracheal stripe, favoring progressive tumor when correlating with priors. Associated right upper lobe opacity, likely post obstructive, although lymphangitic tumor is also possible. Volume loss in the right hemithorax. Left lung is clear. No pneumothorax. The heart is normal in size.  Right cardiomediastinal shift. IMPRESSION: Masslike opacity along the right paratracheal stripe, favoring progressive tumor when correlating with priors. Associated right upper lobe opacity, likely post obstructive, although lymphangitic tumor is also possible. Electronically Signed   By: Julian Hy M.D.   On: 02/27/2022 02:40    EKG: My personal interpretation of EKG shows: sinus tachycardia    Assessment/Plan Principal Problem:   Adult failure to thrive Active Problems:   AKI (acute kidney injury) ([place])   Supratherapeutic INR   Non-small cell lung cancer, right (HCC)   Chronic combined systolic and diastolic CHF (congestive heart failure) (HCC)   Type 2 diabetes mellitus with hyperglycemia, without long-term current use of insulin  (HCC)    Assessment and Plan: * Adult failure to thrive Observation med/surg bed. Oncology to see patient to discuss whether or not to continue chemo/xrt. Pt c/o bitterly of feeling nauseated, tired and weak.  He continues to say "I want you to fix me".  Pt has VERY poor insight into his disease process. Pt refused to be discharged from the ER.  Supratherapeutic INR Appears pt was placed on coumadin after his MCA stroke s/p thrombectomy. That was in 09/2018. Unclear why pt was never switched to [place]. Pt with labile INR measurements. Heme/onc to see patient and decide if pt can be changed to [place]. Pt does not have mechanical heart valve.  AKI (acute kidney injury) ([place]) Likely due to poor po intake. Pt has already received 2 L IVF in ER. Given his hx of CHF, will not continue any further IVF for concern of causing acute CHF exacerbation. Repeat BMP.  Type 2 diabetes mellitus with hyperglycemia, without long-term current use of insulin (HCC) Add SSI. Check a1c.  Chronic combined systolic and diastolic CHF (congestive heart failure) (HCC) Intravascularly dry. Not currently exacerbated  Non-small cell lung cancer, right ([place]) Pt undergoing chemo/xrt. Pt with continued complaints of anorexia and continued weight loss. Oncology to discuss whether or not continued chemo/xrt is the best course of action for this patient. Pt with very limited understand of his disease process.   DVT prophylaxis: SCDs Code Status: Full Code Family Communication: no family at bedside  Disposition Plan: return home vs SNF  Consults called: placed on Dr. Worthy Flank consult list  Admission status: Observation, Med-Surg   Kristopher Oppenheim, DO [facility] 02/27/2022, 5:47 AM

## 2022-02-27 NOTE — Discharge Instructions (Addendum)
Begin taking Zofran as prescribed as needed for nausea.  Drink plenty of fluids and get plenty of rest.  Follow-up with Dr. Julien Nordmann later this week, and return to the ER if your symptoms significantly worsen or change.  _____________________________________________________________  Information on my medicine - ELIQUIS (apixaban)  This medication education was reviewed with me or my healthcare representative as part of my discharge preparation.    Why was Eliquis prescribed for you? Eliquis was prescribed for you to reduce the risk of a blood clot forming that can cause a stroke if you have a medical condition called atrial fibrillation (a type of irregular heartbeat).  What do You need to know about Eliquis ? Take your Eliquis TWICE DAILY - one tablet in the morning and one tablet in the evening with or without food. If you have difficulty swallowing the tablet whole please discuss with your pharmacist how to take the medication safely.  Take Eliquis exactly as prescribed by your doctor and DO NOT stop taking Eliquis without talking to the doctor who prescribed the medication.  Stopping may increase your risk of developing a stroke.  Refill your prescription before you run out.  After discharge, you should have regular check-up appointments with your healthcare provider that is prescribing your Eliquis.  In the future your dose may need to be changed if your kidney function or weight changes by a significant amount or as you get older.  What do you do if you miss a dose? If you miss a dose, take it as soon as you remember on the same day and resume taking twice daily.  Do not take more than one dose of ELIQUIS at the same time to make up a missed dose.  Important Safety Information A possible side effect of Eliquis is bleeding. You should call your healthcare provider right away if you experience any of the following: Bleeding from an injury or your nose that does not  stop. Unusual colored urine (red or dark brown) or unusual colored stools (red or black). Unusual bruising for unknown reasons. A serious fall or if you hit your head (even if there is no bleeding).  Some medicines may interact with Eliquis and might increase your risk of bleeding or clotting while on Eliquis. To help avoid this, consult your healthcare provider or pharmacist prior to using any new prescription or non-prescription medications, including herbals, vitamins, non-steroidal anti-inflammatory drugs (NSAIDs) and supplements.  This website has more information on Eliquis (apixaban): http://www.eliquis.com/eliquis/home

## 2022-02-27 NOTE — ED Provider Notes (Signed)
[facility] DEPT Provider Note   CSN: [id number] Arrival date & time: 02/26/22  2318     History  No chief complaint on file.   Jared Stevens is a 71 y.o. male.  Patient is a 71 year old male with past medical history of non-small cell lung cancer currently undergoing treatment by Dr. Earlie Server, COPD, hyperlipidemia, prior CVA, CHF, diabetes.  Patient presenting today for evaluation of weakness and decreased appetite.  This has been worsening over the past several days.  He reports not having anything to eat and little to drink for the past 4 days.  He denies to me he is having any abdominal pain, fevers, or chills.  He denies headache or cough.  The history is provided by the patient.       Home Medications Prior to Admission medications   Medication Sig Start Date End Date Taking? Authorizing Provider  albuterol (VENTOLIN HFA) 108 (90 Base) MCG/ACT inhaler Inhale 1-2 puffs into the lungs every 6 (six) hours as needed for wheezing or shortness of breath. Patient states he takes only as needed. 05/13/21   [name], [name], NP  amitriptyline (ELAVIL) 50 MG tablet Take 50 mg by mouth at bedtime as needed for sleep. 09/17/18   [provider]  budesonide-formoterol (SYMBICORT) 160-4.5 MCG/ACT inhaler INHALE TWO PUFFS INTO THE LUNGS TWICE DAILY (IN THE MORNING AND IN THE EVENING) 02/20/22   [name], [name], NP  carvedilol (COREG) 3.125 MG tablet TAKE 1 TABLET (3.125 MG TOTAL) BY MOUTH 2 (TWO) TIMES DAILY WITH A MEAL. 10/07/21   [name], [name], NP  Cyanocobalamin (B-12) 2500 MCG SUBL Place 2,500 mcg under the tongue daily.    [provider]  digoxin (LANOXIN) 0.125 MG tablet TAKE 1 TABLET (0.125 MG TOTAL) BY MOUTH DAILY. 10/07/21   [name], [name], NP  finasteride (PROSCAR) 5 MG tablet Take 5 mg by mouth daily. 12/07/20   [provider]  furosemide (LASIX) 40 MG tablet Take 1 tablet (40 mg total) by mouth daily. 01/18/22    Nahser, Wonda Cheng, MD  latanoprost (XALATAN) 0.005 % ophthalmic solution Place 1 drop into both eyes daily. 03/12/18   [provider]  prochlorperazine (COMPAZINE) 10 MG tablet Take 1 tablet (10 mg total) by mouth every 6 (six) hours as needed for nausea or vomiting. 01/10/22   Curt Bears, MD  rosuvastatin (CRESTOR) 40 MG tablet TAKE ONE TABLET BY MOUTH EVERY DAY 07/08/21   Nahser, Wonda Cheng, MD  spironolactone (ALDACTONE) 25 MG tablet TAKE 0.5 TABLETS (12.5 MG TOTAL) BY MOUTH EVERY OTHER DAY. 06/22/21   [name], [name], NP  tamsulosin (FLOMAX) 0.4 MG CAPS capsule Take 0.4 mg by mouth at bedtime. 11/05/20   [provider]  warfarin (COUMADIN) 5 MG tablet MONDAY - THURSDAY TAKE ONE TAB DAILY AND FRIDAY - SUNDAY TAKE 1.5 TABS DAILY 02/01/22   [name], [name], NP      Allergies    Patient has no known allergies.    Review of Systems   Review of Systems  All other systems reviewed and are negative.   Physical Exam Updated Vital Signs BP 91/73   Pulse (!) 113   Temp (!) 97.3 F (36.3 C) (Oral)   Resp 15   SpO2 97%  Physical Exam Vitals and nursing note reviewed.  Constitutional:      General: He is not in acute distress.    Appearance: He is well-developed. He is not diaphoretic.     Comments:  Patient is awake and alert.  He is quite thin and chronically ill-appearing.  HENT:     Head: Normocephalic and atraumatic.     Mouth/Throat:     Mouth: Mucous membranes are dry.  Cardiovascular:     Rate and Rhythm: Normal rate and regular rhythm.     Heart sounds: No murmur heard.    No friction rub.  Pulmonary:     Effort: Pulmonary effort is normal. No respiratory distress.     Breath sounds: Normal breath sounds. No wheezing or rales.  Abdominal:     General: Bowel sounds are normal. There is no distension.     Palpations: Abdomen is soft.     Tenderness: There is no abdominal tenderness.  Musculoskeletal:        General: Normal range of motion.      Cervical back: Normal range of motion and neck supple.  Skin:    General: Skin is warm and dry.  Neurological:     Mental Status: He is alert and oriented to person, place, and time.     Coordination: Coordination normal.     ED Results / Procedures / Treatments   Labs (all labs ordered are listed, but only abnormal results are displayed) Labs Reviewed  BASIC METABOLIC PANEL - Abnormal; Notable for the following components:      Result Value   Sodium 133 (*)    Potassium 3.0 (*)    Chloride 89 (*)    Glucose, Bld 156 (*)    BUN 25 (*)    Creatinine, Ser 1.57 (*)    Calcium 8.8 (*)    GFR, Estimated 47 (*)    All other components within normal limits  CBC - Abnormal; Notable for the following components:   RDW 17.1 (*)    Platelets 535 (*)    All other components within normal limits  CBG MONITORING, ED - Abnormal; Notable for the following components:   Glucose-Capillary 155 (*)    All other components within normal limits  URINALYSIS, ROUTINE W REFLEX MICROSCOPIC  DIGOXIN LEVEL  PROTIME-INR    EKG EKG Interpretation  Date/Time:  Sunday February 26 2022 23:46:41 EDT Ventricular Rate:  114 PR Interval:  94 QRS Duration: 90 QT Interval:  393 QTC Calculation: 542 R Axis:   58 Text Interpretation: Sinus or ectopic atrial tachycardia Inferoposterior infarct, old Abnormal T, consider ischemia, anterior leads Prolonged QT interval Confirmed by Veryl Speak [phone]) on 02/27/2022 1:29:46 AM  Radiology No results found.  Procedures Procedures    Medications Ordered in ED Medications  sodium chloride 0.9 % bolus 1,000 mL (has no administration in time range)    ED Course/ Medical Decision Making/ A&P  Patient is a 71 year old male with past medical history of lung cancer undergoing treatment with Dr. Earlie Server.  Patient presenting for evaluation of weakness and decreased appetite.  He reports having little p.o. intake for the past 4 days.  Patient does appear  somewhat cachectic and thin.  Mucous membranes are somewhat dry and electrolytes do reflect dehydration.  Patient was given 1 L of normal saline and seems to be feeling better.  At this point, nothing suggests an emergent situation that would necessitate admission.  I feel as though he can safely be discharged with nausea medication and follow-up with his oncologist.  Final Clinical Impression(s) / ED Diagnoses Final diagnoses:  None    Rx / DC Orders ED Discharge Orders     None  Veryl Speak, MD 02/27/22 [phone]

## 2022-02-27 NOTE — Assessment & Plan Note (Addendum)
-   Patient appears to have ongoing progressive functional decline especially after being restarted on chemotherapy and radiation for recurrent lung cancer. -He was too weak for going home from the ER and therefore is continued on monitoring in the hospital while we have further [place] discussions with palliative care and family.  I did speak with his daughter on the phone while in his room in the ER and informed her of what appears to be failure to thrive and continuous decline with associated severe cachexia -Follow-up further palliative care discussions - We will also consult PT/OT as patient was living alone

## 2022-02-27 NOTE — ED Notes (Signed)
Went to discharge pt, pt states, "I'm sick, I can't go home." Pt asking to speak with MD. Dr. Stark Jock notified.

## 2022-02-27 NOTE — Progress Notes (Signed)
Progress Note    Laban Orourke   XLK:[id number]  DOB: Jun 04, 1950  DOA: 02/26/2022     0 PCP: Flossie Buffy, NP  Initial CC: weakness  Hospital Course: Mr. Capelli is a 71 yo male with PMH recurrent NSCLC (RUL), chronic combined systolic/diastolic CHF, DMII who presented to the ER with ongoing weakness and weight loss. He was recently started back on chemotherapy and radiation after being diagnosed with recurrent lung cancer found on PET scan in July 2023. He continues to have ongoing weight loss and weakness.  His quality of life has been declining.  He was attempted to be discharged home from the ER but refused discharge due to how weak and poorly he felt. Further issues found on work-up were a labile INR which was elevated.  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  Palliative care was consulted on admission.  His daughter was also informed of his ongoing functional decline at home and that he may warrant further goals of care discussions given his progressive decline.  Interval History:  Laying in bed when seen in the ER with his brother bedside.  Also called and spoke with his daughter on the phone while in the room.  Discussed the patient's cachectic appearance and his ongoing failure to thrive after also being started back on chemoradiation. We discussed plan for consulting palliative care given the patient's functional decline and he would appear better served with consideration of transitioning to hospice.  Assessment and Plan: * Adult failure to thrive - Patient appears to have ongoing progressive functional decline especially after being restarted on chemotherapy and radiation for recurrent lung cancer. -He was too weak for going home from the ER and therefore is continued on monitoring in the hospital while we have further [place] discussions with palliative care and family.  I did speak with his daughter on the phone while in his room in the ER  and informed her of what appears to be failure to thrive and continuous decline with associated severe cachexia -Follow-up further palliative care discussions - We will also consult PT/OT as patient was living alone  Non-small cell lung cancer, right ([place]) - Recurrent non-small cell lung cancer adenocarcinoma initially diagnosed 2017.  Found to have recurrence in July 2023 on PET scan. -Patient endorses poor appetite at home with ongoing weight loss and progressive weakness; he has been started on chemoradiation -See failure to thrive -Follow-up palliative care discussions  Supratherapeutic INR - labile INR - on coumadin due to his MCA stroke s/p thrombectomy - likely can convert to a DOAC however awaiting palliative discussions in case transitions to comfort, would d/c all together   AKI (acute kidney injury) ([place]) - Suspected due to his ongoing poor intake and weight loss - He was given fluid resuscitation in the ER as well.  However in setting of underlying chronic combined systolic/diastolic CHF, holding off on further fluids.  This also speaks towards poor prognosis - Continue trending renal function  Hypokalemia - Replete as needed  Type 2 diabetes mellitus with hyperglycemia, without long-term current use of insulin (HCC) - Check A1c - Continue SSI and CBG monitoring  Chronic combined systolic and diastolic CHF (congestive heart failure) (HCC) Intravascularly dry. Not currently exacerbated   Old records reviewed in assessment of this patient  Antimicrobials:   DVT prophylaxis:  on hold, elevated INR   Code Status:   Code Status: Full Code  Mobility Assessment (last 72 hours)  Mobility Assessment   No documentation.           Barriers to discharge: none Disposition Plan:  Pending Palliative care discussions Status is: Obs  Objective: Blood pressure 108/68, pulse 93, temperature 97.9 F (36.6 C), temperature source Oral, resp. rate (!) 21, SpO2 97 %.   Examination:  Physical Exam Constitutional:      Comments: Cachectic appearing elderly gentleman lying in bed in no distress but appears lethargic and weak  HENT:     Head: Normocephalic and atraumatic.     Mouth/Throat:     Mouth: Mucous membranes are dry.     Comments: Multiple missing teeth Eyes:     Extraocular Movements: Extraocular movements intact.  Cardiovascular:     Rate and Rhythm: Normal rate and regular rhythm.  Pulmonary:     Effort: Pulmonary effort is normal. No respiratory distress.     Breath sounds: Normal breath sounds. No wheezing.  Abdominal:     General: Bowel sounds are normal. There is no distension.     Palpations: Abdomen is soft.     Tenderness: There is no abdominal tenderness.  Musculoskeletal:        General: Normal range of motion.     Cervical back: Normal range of motion and neck supple.  Skin:    General: Skin is warm and dry.  Neurological:     General: No focal deficit present.  Psychiatric:        Mood and Affect: Mood normal.        Behavior: Behavior normal.      Consultants:  Palliative care  Procedures:    Data Reviewed: Results for orders placed or performed during the hospital encounter of 02/26/22 (from the past 24 hour(s))  CBG monitoring, ED     Status: Abnormal   Collection Time: 02/27/22 12:56 AM  Result Value Ref Range   Glucose-Capillary 155 (H) 70 - 99 mg/dL   Comment 1 Notify RN   Basic metabolic panel     Status: Abnormal   Collection Time: 02/27/22  1:05 AM  Result Value Ref Range   Sodium 133 (L) 135 - 145 mmol/L   Potassium 3.0 (L) 3.5 - 5.1 mmol/L   Chloride 89 (L) 98 - 111 mmol/L   CO2 30 22 - 32 mmol/L   Glucose, Bld 156 (H) 70 - 99 mg/dL   BUN 25 (H) 8 - 23 mg/dL   Creatinine, Ser 1.57 (H) 0.61 - 1.24 mg/dL   Calcium 8.8 (L) 8.9 - 10.3 mg/dL   GFR, Estimated 47 (L) >60 mL/min   Anion gap 14 5 - 15  CBC     Status: Abnormal   Collection Time: 02/27/22  1:05 AM  Result Value Ref Range   WBC 7.8  4.0 - 10.5 K/uL   RBC 4.98 4.22 - 5.81 MIL/uL   Hemoglobin 13.9 13.0 - 17.0 g/dL   HCT 41.8 39.0 - 52.0 %   MCV 83.9 80.0 - 100.0 fL   MCH 27.9 26.0 - 34.0 pg   MCHC 33.3 30.0 - 36.0 g/dL   RDW 17.1 (H) 11.5 - 15.5 %   Platelets 535 (H) 150 - 400 K/uL   nRBC 0.0 0.0 - 0.2 %  Digoxin level     Status: Abnormal   Collection Time: 02/27/22  1:35 AM  Result Value Ref Range   Digoxin Level <0.2 (L) 0.8 - 2.0 ng/mL  Protime-INR     Status: Abnormal   Collection Time: 02/27/22  1:35 AM  Result Value Ref Range   Prothrombin Time 41.4 (H) 11.4 - 15.2 seconds   INR 4.4 (HH) 0.8 - 1.2  Prealbumin     Status: Abnormal   Collection Time: 02/27/22  1:35 AM  Result Value Ref Range   Prealbumin 6 (L) 18 - 38 mg/dL  Urinalysis, Routine w reflex microscopic Urine, Clean Catch     Status: Abnormal   Collection Time: 02/27/22  2:37 AM  Result Value Ref Range   Color, Urine YELLOW YELLOW   APPearance CLOUDY (A) CLEAR   Specific Gravity, Urine 1.014 1.005 - 1.030   pH 5.0 5.0 - 8.0   Glucose, UA NEGATIVE NEGATIVE mg/dL   Hgb urine dipstick MODERATE (A) NEGATIVE   Bilirubin Urine NEGATIVE NEGATIVE   Ketones, ur NEGATIVE NEGATIVE mg/dL   Protein, ur 100 (A) NEGATIVE mg/dL   Nitrite POSITIVE (A) NEGATIVE   Leukocytes,Ua NEGATIVE NEGATIVE   RBC / HPF 11-20 0 - 5 RBC/hpf   Bacteria, UA RARE (A) NONE SEEN   Squamous Epithelial / LPF 0-5 0 - 5   Mucus PRESENT    Hyaline Casts, UA PRESENT   Troponin I (High Sensitivity)     Status: Abnormal   Collection Time: 02/27/22  4:47 AM  Result Value Ref Range   Troponin I (High Sensitivity) 31 (H) <18 ng/L  Hepatic function panel     Status: Abnormal   Collection Time: 02/27/22  7:48 AM  Result Value Ref Range   Total Protein 4.4 (L) 6.5 - 8.1 g/dL   Albumin <1.5 (L) 3.5 - 5.0 g/dL   AST 18 15 - 41 U/L   ALT 15 0 - 44 U/L   Alkaline Phosphatase 46 38 - 126 U/L   Total Bilirubin 0.6 0.3 - 1.2 mg/dL   Bilirubin, Direct 0.3 (H) 0.0 - 0.2 mg/dL    Indirect Bilirubin 0.3 0.3 - 0.9 mg/dL  Troponin I (High Sensitivity)     Status: Abnormal   Collection Time: 02/27/22  7:48 AM  Result Value Ref Range   Troponin I (High Sensitivity) 20 (H) <18 ng/L  CBG monitoring, ED     Status: Abnormal   Collection Time: 02/27/22 11:44 AM  Result Value Ref Range   Glucose-Capillary 175 (H) 70 - 99 mg/dL    I have Reviewed nursing notes, Vitals, and Lab results since pt's last encounter. Pertinent lab results : see above I have ordered test including BMP, CBC, Mg I have reviewed the last note from staff over past 24 hours I have discussed pt's care plan and test results with nursing staff, case manager   LOS: 0 days   Dwyane Dee, MD [facility] 02/27/2022, 2:37 PM

## 2022-02-27 NOTE — Assessment & Plan Note (Signed)
Intravascularly dry. Not currently exacerbated

## 2022-02-28 ENCOUNTER — Other Ambulatory Visit: Payer: Self-pay

## 2022-02-28 ENCOUNTER — Ambulatory Visit
Admission: RE | Admit: 2022-02-28 | Discharge: 2022-02-28 | Disposition: A | Discharge status: Home / Self Care | Payer: Medicare Other | Source: Ambulatory Visit | Attending: Radiation Oncology | Admitting: Radiation Oncology

## 2022-02-28 DIAGNOSIS — Z7401 Bed confinement status: Secondary | ICD-10-CM | POA: Diagnosis not present

## 2022-02-28 DIAGNOSIS — N179 Acute kidney failure, unspecified: Secondary | ICD-10-CM | POA: Diagnosis not present

## 2022-02-28 DIAGNOSIS — R2689 Other abnormalities of gait and mobility: Secondary | ICD-10-CM | POA: Diagnosis not present

## 2022-02-28 DIAGNOSIS — E1165 Type 2 diabetes mellitus with hyperglycemia: Secondary | ICD-10-CM | POA: Diagnosis present

## 2022-02-28 DIAGNOSIS — I5042 Chronic combined systolic (congestive) and diastolic (congestive) heart failure: Secondary | ICD-10-CM | POA: Diagnosis not present

## 2022-02-28 DIAGNOSIS — Z7901 Long term (current) use of anticoagulants: Secondary | ICD-10-CM | POA: Diagnosis not present

## 2022-02-28 DIAGNOSIS — Z510 Encounter for antineoplastic radiation therapy: Secondary | ICD-10-CM | POA: Diagnosis not present

## 2022-02-28 DIAGNOSIS — R1312 Dysphagia, oropharyngeal phase: Secondary | ICD-10-CM | POA: Diagnosis not present

## 2022-02-28 DIAGNOSIS — I428 Other cardiomyopathies: Secondary | ICD-10-CM | POA: Diagnosis present

## 2022-02-28 DIAGNOSIS — Z1159 Encounter for screening for other viral diseases: Secondary | ICD-10-CM | POA: Diagnosis not present

## 2022-02-28 DIAGNOSIS — I255 Ischemic cardiomyopathy: Secondary | ICD-10-CM | POA: Diagnosis present

## 2022-02-28 DIAGNOSIS — Z923 Personal history of irradiation: Secondary | ICD-10-CM | POA: Diagnosis not present

## 2022-02-28 DIAGNOSIS — I69391 Dysphagia following cerebral infarction: Secondary | ICD-10-CM | POA: Diagnosis not present

## 2022-02-28 DIAGNOSIS — E876 Hypokalemia: Secondary | ICD-10-CM | POA: Diagnosis not present

## 2022-02-28 DIAGNOSIS — R791 Abnormal coagulation profile: Secondary | ICD-10-CM | POA: Diagnosis present

## 2022-02-28 DIAGNOSIS — I469 Cardiac arrest, cause unspecified: Secondary | ICD-10-CM | POA: Diagnosis not present

## 2022-02-28 DIAGNOSIS — Z515 Encounter for palliative care: Secondary | ICD-10-CM

## 2022-02-28 DIAGNOSIS — R64 Cachexia: Secondary | ICD-10-CM | POA: Diagnosis present

## 2022-02-28 DIAGNOSIS — E7800 Pure hypercholesterolemia, unspecified: Secondary | ICD-10-CM | POA: Diagnosis not present

## 2022-02-28 DIAGNOSIS — Z681 Body mass index (BMI) 19 or less, adult: Secondary | ICD-10-CM | POA: Diagnosis not present

## 2022-02-28 DIAGNOSIS — R2681 Unsteadiness on feet: Secondary | ICD-10-CM | POA: Diagnosis not present

## 2022-02-28 DIAGNOSIS — I69354 Hemiplegia and hemiparesis following cerebral infarction affecting left non-dominant side: Secondary | ICD-10-CM | POA: Diagnosis not present

## 2022-02-28 DIAGNOSIS — I2510 Atherosclerotic heart disease of native coronary artery without angina pectoris: Secondary | ICD-10-CM | POA: Diagnosis present

## 2022-02-28 DIAGNOSIS — Z87891 Personal history of nicotine dependence: Secondary | ICD-10-CM | POA: Diagnosis not present

## 2022-02-28 DIAGNOSIS — Z7189 Other specified counseling: Secondary | ICD-10-CM

## 2022-02-28 DIAGNOSIS — I6601 Occlusion and stenosis of right middle cerebral artery: Secondary | ICD-10-CM | POA: Diagnosis not present

## 2022-02-28 DIAGNOSIS — E860 Dehydration: Secondary | ICD-10-CM | POA: Diagnosis not present

## 2022-02-28 DIAGNOSIS — R627 Adult failure to thrive: Secondary | ICD-10-CM | POA: Diagnosis not present

## 2022-02-28 DIAGNOSIS — C3491 Malignant neoplasm of unspecified part of right bronchus or lung: Secondary | ICD-10-CM | POA: Diagnosis not present

## 2022-02-28 DIAGNOSIS — R000 Tachycardia, unspecified: Secondary | ICD-10-CM | POA: Diagnosis not present

## 2022-02-28 DIAGNOSIS — C3411 Malignant neoplasm of upper lobe, right bronchus or lung: Secondary | ICD-10-CM | POA: Diagnosis not present

## 2022-02-28 DIAGNOSIS — I69828 Other speech and language deficits following other cerebrovascular disease: Secondary | ICD-10-CM | POA: Diagnosis not present

## 2022-02-28 DIAGNOSIS — J918 Pleural effusion in other conditions classified elsewhere: Secondary | ICD-10-CM | POA: Diagnosis not present

## 2022-02-28 DIAGNOSIS — Z85118 Personal history of other malignant neoplasm of bronchus and lung: Secondary | ICD-10-CM | POA: Diagnosis not present

## 2022-02-28 DIAGNOSIS — Z7951 Long term (current) use of inhaled steroids: Secondary | ICD-10-CM | POA: Diagnosis not present

## 2022-02-28 DIAGNOSIS — Z9221 Personal history of antineoplastic chemotherapy: Secondary | ICD-10-CM | POA: Diagnosis not present

## 2022-02-28 DIAGNOSIS — J449 Chronic obstructive pulmonary disease, unspecified: Secondary | ICD-10-CM | POA: Diagnosis not present

## 2022-02-28 DIAGNOSIS — M6281 Muscle weakness (generalized): Secondary | ICD-10-CM | POA: Diagnosis not present

## 2022-02-28 DIAGNOSIS — Z8673 Personal history of transient ischemic attack (TIA), and cerebral infarction without residual deficits: Secondary | ICD-10-CM | POA: Diagnosis not present

## 2022-02-28 DIAGNOSIS — Z79899 Other long term (current) drug therapy: Secondary | ICD-10-CM | POA: Diagnosis not present

## 2022-02-28 LAB — RAD ONC ARIA SESSION SUMMARY
Course Elapsed Days: 12
Plan Fractions Treated to Date: 9
Plan Prescribed Dose Per Fraction: 1.8 Gy
Plan Total Fractions Prescribed: 33
Plan Total Prescribed Dose: 59.4 Gy
Reference Point Dosage Given to Date: 16.2 Gy
Reference Point Session Dosage Given: 1.8 Gy
Session Number: 9

## 2022-02-28 LAB — BASIC METABOLIC PANEL
Anion gap: 11 (ref 5–15)
Anion gap: 8 (ref 5–15)
BUN: 12 mg/dL (ref 8–23)
BUN: 15 mg/dL (ref 8–23)
CO2: 32 mmol/L (ref 22–32)
CO2: 28 mmol/L (ref 22–32)
Calcium: 8.7 mg/dL — ABNORMAL LOW (ref 8.9–10.3)
Calcium: 8.2 mg/dL — ABNORMAL LOW (ref 8.9–10.3)
Chloride: 97 mmol/L — ABNORMAL LOW (ref 98–111)
Chloride: 101 mmol/L (ref 98–111)
Creatinine, Ser: 1.26 mg/dL — ABNORMAL HIGH (ref 0.61–1.24)
Creatinine, Ser: 1.32 mg/dL — ABNORMAL HIGH (ref 0.61–1.24)
GFR, Estimated: >60 mL/min (ref >60)
GFR, Estimated: 58 mL/min — ABNORMAL LOW (ref >60)
Glucose, Bld: 175 mg/dL — ABNORMAL HIGH (ref 70–99)
Glucose, Bld: 114 mg/dL — ABNORMAL HIGH (ref 70–99)
Potassium: <2 mmol/L — CL (ref 3.5–5.1)
Potassium: 3 mmol/L — ABNORMAL LOW (ref 3.5–5.1)
Sodium: 140 mmol/L (ref 135–145)
Sodium: 137 mmol/L (ref 135–145)

## 2022-02-28 LAB — GLUCOSE, CAPILLARY
Glucose-Capillary: 175 mg/dL — ABNORMAL HIGH (ref 70–99)
Glucose-Capillary: 182 mg/dL — ABNORMAL HIGH (ref 70–99)
Glucose-Capillary: 147 mg/dL — ABNORMAL HIGH (ref 70–99)
Glucose-Capillary: 167 mg/dL — ABNORMAL HIGH (ref 70–99)

## 2022-02-28 LAB — CBC WITH DIFFERENTIAL/PLATELET
Abs Immature Granulocytes: 0.08 10*3/uL — ABNORMAL HIGH (ref 0.00–0.07)
Basophils Absolute: 0 10*3/uL (ref 0.0–0.1)
Basophils Relative: 0 %
Eosinophils Absolute: 0 10*3/uL (ref 0.0–0.5)
Eosinophils Relative: 0 %
HCT: 36 % — ABNORMAL LOW (ref 39.0–52.0)
Hemoglobin: 11.8 g/dL — ABNORMAL LOW (ref 13.0–17.0)
Immature Granulocytes: 1 %
Lymphocytes Relative: 5 %
Lymphs Abs: 0.4 10*3/uL — ABNORMAL LOW (ref 0.7–4.0)
MCH: 28 pg (ref 26.0–34.0)
MCHC: 32.8 g/dL (ref 30.0–36.0)
MCV: 85.5 fL (ref 80.0–100.0)
Monocytes Absolute: 1.4 10*3/uL — ABNORMAL HIGH (ref 0.1–1.0)
Monocytes Relative: 15 %
Neutro Abs: 7.1 10*3/uL (ref 1.7–7.7)
Neutrophils Relative %: 79 %
Platelets: 525 10*3/uL — ABNORMAL HIGH (ref 150–400)
RBC: 4.21 MIL/uL — ABNORMAL LOW (ref 4.22–5.81)
RDW: 16.9 % — ABNORMAL HIGH (ref 11.5–15.5)
WBC: 9 10*3/uL (ref 4.0–10.5)
nRBC: 0 % (ref 0.0–0.2)

## 2022-02-28 LAB — MAGNESIUM: Magnesium: 2.4 mg/dL (ref 1.7–2.4)

## 2022-02-28 MED ORDER — POTASSIUM CHLORIDE CRYS ER 20 MEQ PO TBCR
60.0000 meq | EXTENDED_RELEASE_TABLET | Freq: Once | ORAL | Status: AC
  Administered 2022-02-28: 60 meq via ORAL
  Filled 2022-02-28: qty 3

## 2022-02-28 MED ORDER — POTASSIUM CHLORIDE 10 MEQ/100ML IV SOLN
10.0000 meq | INTRAVENOUS | Status: AC
  Administered 2022-02-28 (×6): 10 meq via INTRAVENOUS
  Filled 2022-02-28 (×2): qty 100

## 2022-02-28 NOTE — Plan of Care (Signed)
  Problem: Nutritional: Goal: Maintenance of adequate nutrition will improve Outcome: Not Progressing   Problem: Activity: Goal: Risk for activity intolerance will decrease Outcome: Not Progressing

## 2022-02-28 NOTE — Progress Notes (Signed)
Progress Note    Jared Stevens   KGU:[id number]  DOB: [birth year]  DOA: 02/26/2022     0 PCP: Jared Buffy, NP  Initial CC: weakness  Hospital Course: Jared Stevens is a 71 yo male with PMH recurrent NSCLC (RUL), chronic combined systolic/diastolic CHF, DMII who presented to the ER with ongoing weakness and weight loss. He was recently started back on chemotherapy and radiation after being diagnosed with recurrent lung cancer found on PET scan in July 2023. He continues to have ongoing weight loss and weakness.  His quality of life has been declining.  He was attempted to be discharged home from the ER but refused discharge due to how weak and poorly he felt. Further issues found on work-up were a labile INR which was elevated.  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  Palliative care was consulted on admission.  His daughter was also informed of his ongoing functional decline at home and that he may warrant further goals of care discussions given his progressive decline.  Interval History:  Eating okay and resting when seen. No pain when asked. Trying to figure out [place] plans and how he does with PT. Severe hypok this morning and getting repleted.   Assessment and Plan: * Adult failure to thrive - Patient appears to have ongoing progressive functional decline especially after being restarted on chemotherapy and radiation for recurrent lung cancer. -He was too weak for going home from the ER and therefore is continued on monitoring in the hospital while we have further [place] discussions with palliative care and family.  I did speak with his daughter on the phone while in his room in the ER and informed her of what appears to be failure to thrive and continuous decline with associated severe cachexia -Follow-up further palliative care discussions - We will also consult PT/OT as patient was living alone  Non-small cell lung cancer, right ([place]) - Recurrent  non-small cell lung cancer adenocarcinoma initially diagnosed 2017.  Found to have recurrence in July 2023 on PET scan. -Patient endorses poor appetite at home with ongoing weight loss and progressive weakness; he has been started on chemoradiation -See failure to thrive -Follow-up palliative care discussions  Supratherapeutic INR - labile INR - on coumadin due to his MCA stroke s/p thrombectomy - likely can convert to a DOAC however awaiting palliative discussions in case transitions to comfort, would d/c all together   AKI (acute kidney injury) ([place]) - Suspected due to his ongoing poor intake and weight loss - He was given fluid resuscitation in the ER as well.  However in setting of underlying chronic combined systolic/diastolic CHF, holding off on further fluids.  This also speaks towards poor prognosis - Continue trending renal function  Hypokalemia - Replete as needed  Type 2 diabetes mellitus with hyperglycemia, without long-term current use of insulin (HCC) - A1c 5.9% - Continue SSI and CBG monitoring  Chronic combined systolic and diastolic CHF (congestive heart failure) (HCC) Intravascularly dry. Not currently exacerbated   Old records reviewed in assessment of this patient  Antimicrobials:   DVT prophylaxis:  on hold, elevated INR   Code Status:   Code Status: Full Code  Mobility Assessment (last 72 hours)     Mobility Assessment     Row Name 02/28/22 0915 02/27/22 1550         Does patient have an order for bedrest or is patient medically unstable No - Continue assessment No -  Continue assessment      What is the highest level of mobility based on the progressive mobility assessment? Level 5 (Walks with assist in room/hall) - Balance while stepping forward/back and can walk in room with assist - Complete Level 2 ([place]) - Balance while sitting on edge of bed and cannot stand               Barriers to discharge: none Disposition Plan:  Pending  Palliative care discussions Status is: Obs  Objective: Blood pressure 106/76, pulse 99, temperature 98.4 F (36.9 C), temperature source Oral, resp. rate 16, height 6\' 1"  (1.854 m), weight 56.6 kg, SpO2 98 %.  Examination:  Physical Exam Constitutional:      Comments: Cachectic appearing elderly gentleman lying in bed in no distress but appears lethargic and weak  HENT:     Head: Normocephalic and atraumatic.     Mouth/Throat:     Mouth: Mucous membranes are dry.     Comments: Multiple missing teeth Eyes:     Extraocular Movements: Extraocular movements intact.  Cardiovascular:     Rate and Rhythm: Normal rate and regular rhythm.  Pulmonary:     Effort: Pulmonary effort is normal. No respiratory distress.     Breath sounds: Normal breath sounds. No wheezing.  Abdominal:     General: Bowel sounds are normal. There is no distension.     Palpations: Abdomen is soft.     Tenderness: There is no abdominal tenderness.  Musculoskeletal:        General: Normal range of motion.     Cervical back: Normal range of motion and neck supple.  Skin:    General: Skin is warm and dry.  Neurological:     General: No focal deficit present.  Psychiatric:        Mood and Affect: Mood normal.        Behavior: Behavior normal.      Consultants:  Palliative care  Procedures:    Data Reviewed: Results for orders placed or performed during the hospital encounter of 02/26/22 (from the past 24 hour(s))  Glucose, capillary     Status: Abnormal   Collection Time: 02/27/22  4:22 PM  Result Value Ref Range   Glucose-Capillary 141 (H) 70 - 99 mg/dL  Glucose, capillary     Status: Abnormal   Collection Time: 02/27/22  9:02 PM  Result Value Ref Range   Glucose-Capillary 104 (H) 70 - 99 mg/dL  CBC with Differential/Platelet     Status: Abnormal   Collection Time: 02/28/22  5:30 AM  Result Value Ref Range   WBC 9.0 4.0 - 10.5 K/uL   RBC 4.21 (L) 4.22 - 5.81 MIL/uL   Hemoglobin 11.8 (L) 13.0 -  17.0 g/dL   HCT 36.0 (L) 39.0 - 52.0 %   MCV 85.5 80.0 - 100.0 fL   MCH 28.0 26.0 - 34.0 pg   MCHC 32.8 30.0 - 36.0 g/dL   RDW 16.9 (H) 11.5 - 15.5 %   Platelets 525 (H) 150 - 400 K/uL   nRBC 0.0 0.0 - 0.2 %   Neutrophils Relative % 79 %   Neutro Abs 7.1 1.7 - 7.7 K/uL   Lymphocytes Relative 5 %   Lymphs Abs 0.4 (L) 0.7 - 4.0 K/uL   Monocytes Relative 15 %   Monocytes Absolute 1.4 (H) 0.1 - 1.0 K/uL   Eosinophils Relative 0 %   Eosinophils Absolute 0.0 0.0 - 0.5 K/uL   Basophils Relative 0 %  Basophils Absolute 0.0 0.0 - 0.1 K/uL   Immature Granulocytes 1 %   Abs Immature Granulocytes 0.08 (H) 0.00 - 0.07 K/uL  Glucose, capillary     Status: Abnormal   Collection Time: 02/28/22  7:27 AM  Result Value Ref Range   Glucose-Capillary 175 (H) 70 - 99 mg/dL  Basic metabolic panel     Status: Abnormal   Collection Time: 02/28/22  8:18 AM  Result Value Ref Range   Sodium 140 135 - 145 mmol/L   Potassium <2.0 (LL) 3.5 - 5.1 mmol/L   Chloride 97 (L) 98 - 111 mmol/L   CO2 32 22 - 32 mmol/L   Glucose, Bld 175 (H) 70 - 99 mg/dL   BUN 12 8 - 23 mg/dL   Creatinine, Ser 1.26 (H) 0.61 - 1.24 mg/dL   Calcium 8.7 (L) 8.9 - 10.3 mg/dL   GFR, Estimated >60 >60 mL/min   Anion gap 11 5 - 15  Magnesium     Status: None   Collection Time: 02/28/22  8:18 AM  Result Value Ref Range   Magnesium 2.4 1.7 - 2.4 mg/dL  Glucose, capillary     Status: Abnormal   Collection Time: 02/28/22 11:41 AM  Result Value Ref Range   Glucose-Capillary 182 (H) 70 - 99 mg/dL    I have Reviewed nursing notes, Vitals, and Lab results since pt's last encounter. Pertinent lab results : see above I have ordered test including BMP, CBC, Mg I have reviewed the last note from staff over past 24 hours I have discussed pt's care plan and test results with nursing staff, case manager   LOS: 0 days   Dwyane Dee, MD [facility] 02/28/2022, 2:42 PM

## 2022-02-28 NOTE — Progress Notes (Signed)
OT Cancellation Note  Patient Details Name: Jared Stevens MRN: [id number] DOB: [birth year]   Cancelled Treatment:    Reason Eval/Treat Not Completed: Other (comment)  Reason Eval/Treat Not Completed: Medical issues which prohibited therapy;Other (comment) (Pt/family to meet with PMT for discussion of GOC. pt also noted to be supratherapeutic with INR of 4.4 and K+ <2.0. Will hold until family/pt have determined [place] and pt medically ready.)  Khalik Pewitt, Thereasa Parkin 02/28/2022, 4:19 PM

## 2022-02-28 NOTE — Consult Note (Signed)
Consultation Note Date: 02/28/2022   Patient Name: Jared Stevens  DOB: [birth year]  MRN: [id number]  Age / Sex: 71 y.o., male  PCP: [name], [name], NP Referring Physician: Dwyane Dee, MD  Reason for Consultation: Goals of care, progressive weight loss and decline in setting of ongoing cancer treatment poor insight  HPI/Patient Profile: 71 y.o. male  with past medical history of non-small cell lung cancer diagnosed in 2017 status post chemo and radiation, evidence of recurrence August 2023.  Treatment restarted with weekly CarboTaxol and concurrent radiation-he has had 6 cycles of his chemo and radiation therapy was delayed due to need for medical records, admitted on 02/26/2022 with weakness, failure to thrive, elevated INR, acute kidney injury due to dehydration.  Palliative medicine consulted for above.  Primary Decision Maker PATIENT -and his daughter  Discussion:  I have reviewed medical records including Care Everywhere, progress notes from this and prior admissions, labs and imaging, discussed with RN.  On evaluation patient is awake and alert, he is eating his lunch.  He states that he is feeling a lot better and his appetite is improving.  He asked me to assist him in opening his salad.  I introduced Palliative Medicine as specialized medical care for people living with serious illness. It focuses on providing relief from the symptoms and stress of a serious illness. The goal is to improve quality of life for both the patient and the family.  As far as functional and nutritional status-he has had some ongoing decline, noted to have significant weight loss.  He appears frail and cachectic.  I attempted to elicit values and goals of care important to the patient.  He discussed hoping for the best but preparing for the worst.  Our discussion was somewhat circuitous-I asked what is important to him  and he said "everything", when I pushed for more specifics he said "life", when I asked how he defined life he said "being alive".   Advance directives, concepts specific to code status, artificial feeding and hydration, and rehospitalization were considered and discussed. He wishes to remain full code for now.   Discussed with patient/family the importance of continued conversation with family and the medical providers regarding overall plan of care and treatment options, ensuring decisions are within the context of the patient's values and GOCs.    SUMMARY OF RECOMMENDATIONS   -Patient appears chronically ill and significantly disabled, he notes that the chemotherapy has contributed to his decrease in his function however if it were to be offered he would be accepting of it -Needs to be seen by oncology prior to more goals of care conversations -Chart review shows some mention of cognitive deficits and memory-I called his daughter and left a message requesting a return call it would be important to include her in future goals of care discussions-we will attempt to arrange a time to meet with her while patient is inpatient if he is discharged we will refer for follow-up with palliative at the cancer center  Code Status/Advance Care  Planning: Full code   Prognosis:   Unable to determine  Discharge Planning: To Be Determined  Primary Diagnoses: Present on Admission:  Adult failure to thrive  Chronic combined systolic and diastolic CHF (congestive heart failure) (HCC)  Non-small cell lung cancer, right (HCC)  Type 2 diabetes mellitus with hyperglycemia, without long-term current use of insulin (HCC)   Review of Systems  Physical Exam  Vital Signs: BP 114/63 (BP Location: Left Arm)   Pulse (!) 101   Temp 97.8 F (36.6 C) (Oral)   Resp 20   Ht 6\' 1"  (1.854 m)   Wt 56.6 kg   SpO2 93%   BMI 16.46 kg/m  Pain Scale: 0-10 POSS *See Group Information*: 1-Acceptable,Awake and  alert Pain Score: 0-No pain   SpO2: SpO2: 93 % O2 Device:SpO2: 93 % O2 Flow Rate: .   IO: Intake/output summary:  Intake/Output Summary (Last 24 hours) at 02/28/2022 1316 Last data filed at 02/28/2022 0900 Gross per 24 hour  Intake 1260.61 ml  Output 1900 ml  Net -639.39 ml    LBM:   Baseline Weight: Weight: 56.6 kg Most recent weight: Weight: 56.6 kg       Thank you for this consult. Palliative medicine will continue to follow and assist as needed.   Greater than 50%  of this time was spent counseling and coordinating care related to the above assessment and plan.  Signed by: Mariana Kaufman, AGNP-C Palliative Medicine    Please contact Palliative Medicine Team phone at [phone] for questions and concerns.  For individual provider: See Shea Evans

## 2022-02-28 NOTE — Progress Notes (Signed)
PT Cancellation Note  Patient Details Name: Jared Stevens MRN: [id number] DOB: January 19, 1951   Cancelled Treatment:    Reason Eval/Treat Not Completed: Medical issues which prohibited therapy;Other (comment) (Pt/family to meet with PMT for discussion of GOC. pt also noted to be supratherapeutic with INR of 4.4 and K+ <2.0. Will hold until family/pt have determined [place] and pt medically ready.)  Verner Mould, [place] Office [phone]  02/28/22 10:10 AM

## 2022-03-01 ENCOUNTER — Other Ambulatory Visit: Payer: Self-pay

## 2022-03-01 ENCOUNTER — Ambulatory Visit
Admission: RE | Admit: 2022-03-01 | Discharge: 2022-03-01 | Disposition: A | Discharge status: Home / Self Care | Payer: Medicare Other | Source: Ambulatory Visit | Attending: Radiation Oncology | Admitting: Radiation Oncology

## 2022-03-01 DIAGNOSIS — R627 Adult failure to thrive: Secondary | ICD-10-CM | POA: Diagnosis not present

## 2022-03-01 DIAGNOSIS — C3411 Malignant neoplasm of upper lobe, right bronchus or lung: Secondary | ICD-10-CM | POA: Diagnosis not present

## 2022-03-01 DIAGNOSIS — Z87891 Personal history of nicotine dependence: Secondary | ICD-10-CM | POA: Diagnosis not present

## 2022-03-01 DIAGNOSIS — Z510 Encounter for antineoplastic radiation therapy: Secondary | ICD-10-CM | POA: Diagnosis not present

## 2022-03-01 DIAGNOSIS — E876 Hypokalemia: Secondary | ICD-10-CM

## 2022-03-01 LAB — CBC WITH DIFFERENTIAL/PLATELET
Abs Immature Granulocytes: 0.12 10*3/uL — ABNORMAL HIGH (ref 0.00–0.07)
Basophils Absolute: 0 10*3/uL (ref 0.0–0.1)
Basophils Relative: 0 %
Eosinophils Absolute: 0 10*3/uL (ref 0.0–0.5)
Eosinophils Relative: 0 %
HCT: 36.4 % — ABNORMAL LOW (ref 39.0–52.0)
Hemoglobin: 11.7 g/dL — ABNORMAL LOW (ref 13.0–17.0)
Immature Granulocytes: 1 %
Lymphocytes Relative: 4 %
Lymphs Abs: 0.4 10*3/uL — ABNORMAL LOW (ref 0.7–4.0)
MCH: 27.9 pg (ref 26.0–34.0)
MCHC: 32.1 g/dL (ref 30.0–36.0)
MCV: 86.9 fL (ref 80.0–100.0)
Monocytes Absolute: 1.4 10*3/uL — ABNORMAL HIGH (ref 0.1–1.0)
Monocytes Relative: 13 %
Neutro Abs: 8.4 10*3/uL — ABNORMAL HIGH (ref 1.7–7.7)
Neutrophils Relative %: 82 %
Platelets: 519 10*3/uL — ABNORMAL HIGH (ref 150–400)
RBC: 4.19 MIL/uL — ABNORMAL LOW (ref 4.22–5.81)
RDW: 17.2 % — ABNORMAL HIGH (ref 11.5–15.5)
WBC: 10.3 10*3/uL (ref 4.0–10.5)
nRBC: 0 % (ref 0.0–0.2)

## 2022-03-01 LAB — GLUCOSE, CAPILLARY
Glucose-Capillary: 147 mg/dL — ABNORMAL HIGH (ref 70–99)
Glucose-Capillary: 178 mg/dL — ABNORMAL HIGH (ref 70–99)
Glucose-Capillary: 154 mg/dL — ABNORMAL HIGH (ref 70–99)
Glucose-Capillary: 131 mg/dL — ABNORMAL HIGH (ref 70–99)

## 2022-03-01 LAB — RAD ONC ARIA SESSION SUMMARY
Course Elapsed Days: 13
Plan Fractions Treated to Date: 10
Plan Prescribed Dose Per Fraction: 1.8 Gy
Plan Total Fractions Prescribed: 33
Plan Total Prescribed Dose: 59.4 Gy
Reference Point Dosage Given to Date: 18 Gy
Reference Point Session Dosage Given: 1.8 Gy
Session Number: 10

## 2022-03-01 LAB — BASIC METABOLIC PANEL
Anion gap: 11 (ref 5–15)
BUN: 12 mg/dL (ref 8–23)
CO2: 28 mmol/L (ref 22–32)
Calcium: 8.3 mg/dL — ABNORMAL LOW (ref 8.9–10.3)
Chloride: 99 mmol/L (ref 98–111)
Creatinine, Ser: 1.12 mg/dL (ref 0.61–1.24)
GFR, Estimated: >60 mL/min (ref >60)
Glucose, Bld: 106 mg/dL — ABNORMAL HIGH (ref 70–99)
Potassium: 2.5 mmol/L — CL (ref 3.5–5.1)
Sodium: 138 mmol/L (ref 135–145)

## 2022-03-01 LAB — PROTIME-INR
INR: 1.8 — ABNORMAL HIGH (ref 0.8–1.2)
Prothrombin Time: 20.5 seconds — ABNORMAL HIGH (ref 11.4–15.2)

## 2022-03-01 LAB — MAGNESIUM: Magnesium: 2.4 mg/dL (ref 1.7–2.4)

## 2022-03-01 MED ORDER — SODIUM CHLORIDE 0.9 % IV BOLUS
250.0000 mL | Freq: Once | INTRAVENOUS | Status: AC
  Administered 2022-03-01: 250 mL via INTRAVENOUS

## 2022-03-01 MED ORDER — POTASSIUM CHLORIDE CRYS ER 20 MEQ PO TBCR
60.0000 meq | EXTENDED_RELEASE_TABLET | ORAL | Status: AC
  Administered 2022-03-01 (×2): 60 meq via ORAL
  Filled 2022-03-01 (×2): qty 3

## 2022-03-01 MED ORDER — ZOLPIDEM TARTRATE 5 MG PO TABS
5.0000 mg | ORAL_TABLET | Freq: Every evening | ORAL | Status: DC | PRN
  Administered 2022-03-01 – 2022-03-03 (×3): 5 mg via ORAL
  Filled 2022-03-01 (×3): qty 1

## 2022-03-01 NOTE — Evaluation (Addendum)
Physical Therapy Evaluation Patient Details Name: Jared Stevens MRN: [id number] DOB: 1950-06-02 Today's Date: 03/01/2022  History of Present Illness  71 yo male with PMH recurrent NSCLC (RUL), chronic combined systolic/diastolic CHF, DMII who presented to the ER with ongoing weakness and weight loss.  He was recently started back on chemotherapy and radiation after being diagnosed with recurrent lung cancer found on PET scan in July 2023.  He continues to have ongoing weight loss and weakness.  Clinical Impression  Pt admitted with above diagnosis. Pt ambulated 3' from bed to recliner with RW, distance limited by fatigue. He reports having a fall on 02/26/22 and inability to walk since then. At baseline he walks independently with a cane and denies other falls in the past 6 months. Pt reports he has no assistance available at home and that he has a flight of stairs to get to his bedroom. At present he is not able to safely mobilize at home independently, ST-SNF recommended.  Pt currently with functional limitations due to the deficits listed below (see PT Problem List). Pt will benefit from skilled PT to increase their independence and safety with mobility to allow discharge to the venue listed below.          Recommendations for follow up therapy are one component of a multi-disciplinary discharge planning process, led by the attending physician.  Recommendations may be updated based on patient status, additional functional criteria and insurance authorization.  Follow Up Recommendations Skilled nursing-short term rehab (<3 hours/day) Can patient physically be transported by private vehicle: Yes    Assistance Recommended at Discharge Intermittent Supervision/Assistance  Patient can return home with the following  A little help with walking and/or transfers;A little help with bathing/dressing/bathroom;Assistance with cooking/housework;Assist for transportation;Direct supervision/assist for  medications management;Help with stairs or ramp for entrance    Equipment Recommendations Rolling walker (2 wheels)  Recommendations for Other Services       Functional Status Assessment Patient has had a recent decline in their functional status and demonstrates the ability to make significant improvements in function in a reasonable and predictable amount of time.     Precautions / Restrictions Precautions Precautions: Fall Precaution Comments: pt fell 02/26/22, denies other falls in past 6 months Restrictions Weight Bearing Restrictions: No      Mobility  Bed Mobility Overal bed mobility: Modified Independent             General bed mobility comments: HOB up, used rail    Transfers Overall transfer level: Needs assistance Equipment used: Rolling walker (2 wheels) Transfers: Sit to/from Stand, Bed to chair/wheelchair/BSC Sit to Stand: Mod assist, From elevated surface   Step pivot transfers: Min assist       General transfer comment: assist to power up, VCs hand placement    Ambulation/Gait Ambulation/Gait assistance: Min assist Gait Distance (Feet): 3 Feet Assistive device: Rolling walker (2 wheels) Gait Pattern/deviations: Step-to pattern, Decreased stride length Gait velocity: decr     General Gait Details: distance limited by fatigue, BLEs began to buckle  Stairs            Wheelchair Mobility    Modified Rankin (Stroke Patients Only)       Balance Overall balance assessment: Needs assistance Sitting-balance support: Feet supported, No upper extremity supported Sitting balance-Leahy Scale: Fair     Standing balance support: Bilateral upper extremity supported, During functional activity, Reliant on assistive device for balance Standing balance-Leahy Scale: Poor  Pertinent Vitals/Pain Pain Assessment Pain Assessment: No/denies pain    Home Living Family/patient expects to be discharged to::  Private residence Living Arrangements: Alone     Home Access: Level entry     Alternate Level Stairs-Number of Steps: flight Home Layout: Two level;Bed/bath upstairs Home Equipment: Kasandra Knudsen - single point Additional Comments: lives alone, reports he has no assistance available    Prior Function Prior Level of Function : Driving;Independent/Modified Independent             Mobility Comments: walks with SPC at baseline, had a fall on 02/26/22 and inability to walk since then, denies other falls in past 6 months       Hand Dominance        Extremity/Trunk Assessment   Upper Extremity Assessment Upper Extremity Assessment: Defer to OT evaluation    Lower Extremity Assessment Lower Extremity Assessment: Generalized weakness (knee ext 4/5 B; fatigues quickly functionally)    Cervical / Trunk Assessment Cervical / Trunk Assessment: Kyphotic  Communication   Communication: No difficulties  Cognition Arousal/Alertness: Awake/alert Behavior During Therapy: WFL for tasks assessed/performed Overall Cognitive Status: Within Functional Limits for tasks assessed                                          General Comments      Exercises     Assessment/Plan    PT Assessment Patient needs continued PT services  PT Problem List Decreased mobility;Decreased activity tolerance;Decreased balance       PT Treatment Interventions Gait training;Therapeutic exercise;Patient/family education;Functional mobility training;Therapeutic activities    PT Goals (Current goals can be found in the Care Plan section)  Acute Rehab PT Goals Patient Stated Goal: to be able to walk PT Goal Formulation: With patient Time For Goal Achievement: 03/15/22 Potential to Achieve Goals: Good    Frequency Min 3X/week     Co-evaluation               AM-PAC PT "6 Clicks" Mobility  Outcome Measure Help needed turning from your back to your side while in a flat bed without  using bedrails?: None Help needed moving from lying on your back to sitting on the side of a flat bed without using bedrails?: A Little Help needed moving to and from a bed to a chair (including a wheelchair)?: A Little Help needed standing up from a chair using your arms (e.g., wheelchair or bedside chair)?: A Lot Help needed to walk in hospital room?: A Lot Help needed climbing 3-5 steps with a railing? : Total 6 Click Score: 15    End of Session Equipment Utilized During Treatment: Gait belt Activity Tolerance: Patient limited by fatigue Patient left: in chair;with chair alarm set;with call bell/phone within reach Nurse Communication: Mobility status;Other (comment) (pt's bed and gown wet) PT Visit Diagnosis: Difficulty in walking, not elsewhere classified (R26.2);History of falling (Z91.81)    Time: 2197-5883 PT Time Calculation (min) (ACUTE ONLY): 20 min   Charges:   PT Evaluation $PT Eval Moderate Complexity: 1 Mod         Philomena Doheny PT 03/01/2022  Acute Rehabilitation Services  Office [phone]

## 2022-03-01 NOTE — Evaluation (Signed)
Occupational Therapy Evaluation Patient Details Name: Jared Stevens MRN: [id number] DOB: [birth year] Today's Date: 03/01/2022   History of Present Illness 71 yo male with PMH recurrent NSCLC (RUL), chronic combined systolic/diastolic CHF, DMII who presented to the ER with ongoing weakness and weight loss.  He was recently started back on chemotherapy and radiation after being diagnosed with recurrent lung cancer found on PET scan in July 2023.  He continues to have ongoing weight loss and weakness.   Clinical Impression      Patient reported general feelings of fatigue, as he has not been able to sleep much over the past few days. He also indicated having R knee soreness from a very recent fall, as well as increased chest discomfort. He was further noted to be with slight generalized strength deficits and deconditioning. He required min assist for lower body dressing, supervision for bed mobility, and min guard assist for sit to stand.  He will benefit from further OT services to maximize his independence with ADLs and to decrease the risk for falls and restricted participation in meaningful activities.    Recommendations for follow up therapy are one component of a multi-disciplinary discharge planning process, led by the attending physician.  Recommendations may be updated based on patient status, additional functional criteria and insurance authorization.   Follow Up Recommendations  Skilled nursing-short term rehab (<3 hours/day)    Assistance Recommended at Discharge Intermittent Supervision/Assistance  Patient can return home with the following Assist for transportation;Help with stairs or ramp for entrance;A little help with bathing/dressing/bathroom    Functional Status Assessment  Patient has had a recent decline in their functional status and demonstrates the ability to make significant improvements in function in a reasonable and predictable amount of time.  Equipment  Recommendations  None recommended by OT       Precautions / Restrictions Precautions Precautions: Fall Precaution Comments: pt fell 02/26/22, denies other falls in past 6 months Restrictions Weight Bearing Restrictions: No      Mobility Bed Mobility Overal bed mobility: Needs Assistance Bed Mobility: Supine to Sit, Sit to Supine     Supine to sit: Supervision Sit to supine: Supervision        Transfers Overall transfer level: Needs assistance   Transfers: Sit to/from Stand Sit to Stand: Min guard          Balance     Sitting balance-Leahy Scale: Good         Standing balance comment: min guard assist for static standing; min assist for dynamic standing             ADL either performed or assessed with clinical judgement   ADL   Eating/Feeding: Independent   Grooming: Set up Grooming Details (indicate cue type and reason): simulated seated EOB         Upper Body Dressing : Set up;Sitting Upper Body Dressing Details (indicate cue type and reason): simulated edge of bed Lower Body Dressing: Minimal assistance                 Vision Baseline Vision/History: 1 Wears glasses Patient Visual Report: No change from baseline              Pertinent Vitals/Pain Pain Assessment Pain Assessment: 0-10 Pain Score: 7  Pain Location: chest Pain Intervention(s): Limited activity within patient's tolerance     Hand Dominance Right   Extremity/Trunk Assessment Upper Extremity Assessment Upper Extremity Assessment: Overall WFL for tasks assessed (B UE strength 4/5 to 4+/5)  Lower Extremity Assessment Lower Extremity Assessment: Overall WFL for tasks assessed       Communication Communication Communication: No difficulties   Cognition Arousal/Alertness: Awake/alert Behavior During Therapy: WFL for tasks assessed/performed Overall Cognitive Status: Within Functional Limits for tasks assessed            General Comments: Oriented x4,  able to follow commands without difficulty            Home Living Family/patient expects to be discharged to:: Private residence Living Arrangements: Alone   Type of Home: [place]: Two level;Bed/bath upstairs Alternate Level Stairs-Number of Steps: flight             Home Equipment: Cane - single point;Shower seat   Additional Comments: lives alone, reports he has no assistance available      Prior Functioning/Environment               Mobility Comments: Ambulates with a cane ADLs Comments: Independent with ADLs & cooking/cleaning. He does not drive.        OT Problem List: Decreased strength;Decreased activity tolerance;Impaired balance (sitting and/or standing);Pain      OT Treatment/Interventions: Self-care/ADL training;Therapeutic exercise;Therapeutic activities;Energy conservation;DME and/or AE instruction;Patient/family education;Balance training    OT Goals(Current goals can be found in the care plan section) Acute Rehab OT Goals Patient Stated Goal: To get some rest OT Goal Formulation: With patient Time For Goal Achievement: 03/15/22 Potential to Achieve Goals: Good ADL Goals Pt Will Perform Grooming: with modified independence;standing Pt Will Perform Lower Body Dressing: with modified independence;sit to/from stand Pt Will Transfer to Toilet: with modified independence;ambulating Pt Will Perform Toileting - Clothing Manipulation and hygiene: with modified independence;sit to/from stand  OT Frequency: Min 2X/week       AM-PAC OT "6 Clicks" Daily Activity     Outcome Measure Help from another person eating meals?: None Help from another person taking care of personal grooming?: A Little Help from another person toileting, which includes using toliet, bedpan, or urinal?: A Little Help from another person bathing (including washing, rinsing, drying)?: A Little Help from another person to put on and taking off regular upper  body clothing?: A Little Help from another person to put on and taking off regular lower body clothing?: A Little 6 Click Score: 19   End of Session Nurse Communication:  (Nurse cleared the pt for participation in therapy)  Activity Tolerance: Patient limited by fatigue Patient left: in bed;with call bell/phone within reach;with bed alarm set  OT Visit Diagnosis: Unsteadiness on feet (R26.81);Muscle weakness (generalized) (M62.81)                Time: 2423-5361 OT Time Calculation (min): 17 min Charges:  OT General Charges $OT Visit: 1 Visit OT Evaluation $OT Eval Low Complexity: 1 Low    Ipek Westra L Ysenia Filice, OTR/L 03/01/2022, 4:02 PM

## 2022-03-01 NOTE — TOC Initial Note (Signed)
Transition of Care [facility]) - Initial/Assessment Note    Patient Details  Name: Cordarrel Stiefel MRN: [id number] Date of Birth: 25-Feb-1951  [facility] CM/SW Contact:    Leeroy Cha, RN Phone Number: 03/01/2022, 8:42 AM  Clinical Narrative:                 Possible snf placement versus home with hospice services.  Expected Discharge Plan: Skilled Nursing Facility Barriers to Discharge: Continued Medical Work up   Patient Goals and CMS Choice Patient states their goals for this hospitalization and ongoing recovery are:: i would like to go home CMS Medicare.gov Compare Post Acute Care list provided to:: Patient    Expected Discharge Plan and Services Expected Discharge Plan: [place]   Discharge Planning Services: CM Consult   Living arrangements for the past 2 months: Apartment                                      Prior Living Arrangements/Services Living arrangements for the past 2 months: Apartment Lives with:: Self Patient language and need for interpreter reviewed:: Yes Do you feel safe going back to the place where you live?: Yes      Need for Family Participation in Patient Care: Yes (Comment) (possible palliative care)     Criminal Activity/Legal Involvement Pertinent to Current Situation/Hospitalization: No - Comment as needed  Activities of Daily Living Home Assistive Devices/Equipment: Eyeglasses, CBG Meter, Cane (specify quad or straight) (straight cane) ADL Screening (condition at time of admission) Patient's cognitive ability adequate to safely complete daily activities?: Yes Is the patient deaf or have difficulty hearing?: No Does the patient have difficulty seeing, even when wearing glasses/contacts?: No Does the patient have difficulty concentrating, remembering, or making decisions?: No Patient able to express need for assistance with ADLs?: Yes Does the patient have difficulty dressing or bathing?:  Yes Independently performs ADLs?: No Does the patient have difficulty walking or climbing stairs?: Yes Weakness of Legs: Both Weakness of Arms/Hands: Both  Permission Sought/Granted                  Emotional Assessment Appearance:: Appears stated age Attitude/Demeanor/Rapport: Engaged Affect (typically observed): Calm Orientation: : Oriented to Self, Oriented to Place, Oriented to  Time, Oriented to Situation Alcohol / Substance Use: Not Applicable Psych Involvement: No (comment)  Admission diagnosis:  Adult failure to thrive [R62.7] Dehydration [E86.0] Patient Active Problem List   Diagnosis Date Noted   Adult failure to thrive 02/27/2022   AKI (acute kidney injury) ([place]) 02/27/2022   Supratherapeutic INR 02/27/2022   Hypokalemia 02/27/2022   Adenocarcinoma of right lung, stage 3 ([place]) 01/05/2022   Encounter for antineoplastic chemotherapy 01/05/2022   Lung mass 12/21/2021   Type 2 diabetes mellitus with hyperglycemia, without long-term current use of insulin ([place]) 11/23/2021   Peripheral neuropathy due to chemotherapy ([place]) 07/22/2021   CAD (coronary artery disease) 05/11/2021   Benign prostatic hyperplasia without lower urinary tract symptoms 01/19/2021   Inguinal hernia of left side without obstruction or gangrene 01/19/2021   Long term current use of anticoagulant therapy 01/01/2020   Chronic combined systolic and diastolic CHF (congestive heart failure) ([place]) 10/31/2019   Neurocognitive deficits 11/06/2018   History of CVA with residual deficit 10/21/2018   Middle cerebral artery embolism, right 10/21/2018   Recurrent right pleural effusion    History of lung cancer 09/27/2018   Elevated  brain natriuretic peptide (BNP) level 09/27/2018   COPD (chronic obstructive pulmonary disease) ([place]) 09/27/2018   Hyperlipidemia 09/27/2018   Non-small cell lung cancer, right ([place]) 08/26/2018   PCP:  Flossie Buffy, NP Pharmacy:   [facility] [place]  [place] [address] Phone: [phone] Fax: [phone]     Social Determinants of Health (SDOH) Interventions    Readmission Risk Interventions   No data to display

## 2022-03-01 NOTE — Progress Notes (Signed)
DIAGNOSIS: Recurrent non-small cell lung cancer, adenocarcinoma that was initially diagnosed as stage IIIA non-small cell lung cancer, adenocarcinoma diagnosed in 2017   PRIOR THERAPY: status post a course of concurrent chemoradiation with weekly carboplatin and paclitaxel completed in early 2018 at [facility]. The patient has been in observation since that time.   CURRENT THERAPY: Retreatment with concurrent chemoradiation with weekly carboplatin for AUC of 2 and paclitaxel 45 Mg/M2.  First dose January 16, 2022.  Status post 6 cycles.  Subjective: The patient is seen and examined today.  He is a very pleasant 71 years old African-American male with recurrent non-small cell lung cancer, adenocarcinoma that was initially diagnosed as a stage IIIa in 2017 when he was in [place].  He was treated with concurrent chemoradiation completed in early 2018 and the patient has been on observation since that time until he had evidence for disease recurrence in June 2023.  He is currently undergoing a course of concurrent chemoradiation with weekly carboplatin and paclitaxel status post 6 cycles of treatment.  He has been tolerating the treatment well except for increasing fatigue and weakness.  He was getting much weaker at home could not manage his daily activity and he presented to the emergency department for further evaluation.  He is feeling a little bit better today but continues to have the generalized weakness.  He has no current nausea, vomiting, diarrhea or constipation.  He has no headache or visual changes.  Objective: Vital signs in last 24 hours: Temp:  [98.4 F (36.9 C)-99.8 F (37.7 C)] 99.1 F (37.3 C) (10/04 0415) Pulse Rate:  [98-109] 109 (10/04 0415) Resp:  [16-20] 20 (10/04 0415) BP: (106-114)/(63-76) 114/63 (10/04 0415) SpO2:  [95 %-98 %] 97 % (10/04 0415)  Intake/Output from previous day: 10/03 0701 - 10/04 0700 In: 1480 [P.O.:1080; IV Piggyback:400] Out: 5100  [Urine:5100] Intake/Output this shift: No intake/output data recorded.  General appearance: alert, cooperative, fatigued, and no distress Resp: clear to auscultation bilaterally Cardio: regular rate and rhythm, S1, S2 normal, no murmur, click, rub or gallop GI: soft, non-tender; bowel sounds normal; no masses,  no organomegaly Extremities: extremities normal, atraumatic, no cyanosis or edema  Lab Results:  Recent Labs    02/28/22 0530 03/01/22 0701  WBC 9.0 10.3  HGB 11.8* 11.7*  HCT 36.0* 36.4*  PLT 525* 519*   BMET Recent Labs    02/28/22 1739 03/01/22 0701  NA 137 138  K 3.0* 2.5*  CL 101 99  CO2 28 28  GLUCOSE 114* 106*  BUN 15 12  CREATININE 1.32* 1.12  CALCIUM 8.2* 8.3*    Studies/Results: No results found.  Medications: I have reviewed the patient's current medications.   Assessment/Plan: This is a very pleasant 71 years old African-American male with multiple medical problems as well as recurrent non-small cell lung cancer, adenocarcinoma and currently undergoing a course of concurrent chemoradiation with weekly carboplatin and paclitaxel as a radiosensitizing for radiation.  He is status post 6 cycles and has been tolerating his chemotherapy fairly well.  He started having increasing fatigue and weakness and could not manage his daily activity at home.  The patient has no family member in the area except for his daughter but she does not live with him. I recommended for the patient to continue with the concurrent radiotherapy as planned.  I did hold his chemotherapy for this week but we will be able to start next week if he is feeling a little bit better.  For the fatigue and weakness, the patient may need discharge to skilled nursing facility with the ability to transport him for his daily radiation and weekly chemotherapy. For the pain management continue Oxy IR. For the history of diabetes mellitus, he will continue his current treatment under the primary  team. Thank you so much for taking good care of Jared Stevens, I will continue to follow-up the patient with you and assist in his management on as-needed basis. Disclaimer: This note was dictated with voice recognition software. Similar sounding words can inadvertently be transcribed and may be missed upon review.   LOS: 1 day    Eilleen Kempf 03/01/2022

## 2022-03-01 NOTE — Progress Notes (Signed)
Progress Note   Patient: Jared Stevens [id number]:[id number] DOB: 1951/01/07 DOA: 02/26/2022     1 DOS: the patient was seen and examined on 03/01/2022   Brief hospital course: 71 yo male with PMH recurrent NSCLC (RUL), chronic combined systolic/diastolic CHF, DMII who presented to the ER with ongoing weakness and weight loss. He was recently started back on chemotherapy and radiation after being diagnosed with recurrent lung cancer found on PET scan in July 2023. He continues to have ongoing weight loss and weakness.  His quality of life has been declining.  He was attempted to be discharged home from the ER but refused discharge due to how weak and poorly he felt. Further issues found on work-up were a labile INR which was elevated.  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   Palliative care was consulted on admission.  Assessment and Plan: * Adult failure to thrive - Patient appears to have ongoing progressive functional decline especially after being restarted on chemotherapy and radiation for recurrent lung cancer. -He was too weak for going home from the ER and therefore is continued on monitoring in the hospital while we have further [place] discussions with palliative care and family.  Initial concerns about failure to thrive and continuous decline with associated severe cachexia -Palliative Care following. Pt also seen by Oncology who had recommended continued chemo - PT/OT consulted.Plans for SNF   Non-small cell lung cancer, right ([place]) - Recurrent non-small cell lung cancer adenocarcinoma initially diagnosed 2017.  Found to have recurrence in July 2023 on PET scan. -Patient endorses poor appetite at home with ongoing weight loss and progressive weakness; he has been started on chemoradiation -See failure to thrive -Palliative Care following. Also seen by Oncology who had recommended continued chemo as early as next week   Supratherapeutic INR - labile INR -  on coumadin due to his MCA stroke s/p thrombectomy - likely can convert to a DOAC soon   AKI (acute kidney injury) ([place]) - Suspected due to his ongoing poor intake and weight loss - He was given fluid resuscitation in the ER as well.   -Cr improved -recheck bmet in AM   Hypokalemia - Replete as needed   Type 2 diabetes mellitus with hyperglycemia, without long-term current use of insulin (HCC) - A1c 5.9% - Continue SSI and CBG monitoring   Chronic combined systolic and diastolic CHF (congestive heart failure) (HCC) Intravascularly dry. Not currently exacerbated       Subjective: Without complaints this AM  Physical Exam: Vitals:   02/28/22 1327 02/28/22 2018 03/01/22 0415 03/01/22 1327  BP: 106/76 111/69 114/63 99/63  Pulse: 99 98 (!) 109 (!) 104  Resp: 16 18 20  (!) 22  Temp: 98.4 F (36.9 C) 99.8 F (37.7 C) 99.1 F (37.3 C) 98.8 F (37.1 C)  TempSrc: Oral Oral Oral Oral  SpO2: 98% 95% 97% 91%  Weight:      Height:       General exam: Awake, sitting in chair in nad, appears thin  Respiratory system: Normal respiratory effort, no wheezing Cardiovascular system: regular rate, s1, s2 Gastrointestinal system: Soft, nondistended, positive BS Central nervous system: CN2-12 grossly intact, strength intact Extremities: Perfused, no clubbing Skin: Normal skin turgor, no notable skin lesions seen Psychiatry: Mood normal // no visual hallucinations   Data Reviewed:  Labs reviewed: Na 138, K 2.5, Cr 1.12  Family Communication: Pt in room, family not at bedside  Disposition: Status is: Inpatient Remains  inpatient appropriate because: Severity of illness  Planned Discharge Destination: Skilled nursing facility     Author: Marylu Lund, MD 03/01/2022 2:30 PM  For on call review [email or web address].

## 2022-03-02 ENCOUNTER — Other Ambulatory Visit: Payer: Self-pay

## 2022-03-02 ENCOUNTER — Ambulatory Visit
Admission: RE | Admit: 2022-03-02 | Discharge: 2022-03-02 | Disposition: A | Discharge status: Home / Self Care | Payer: Medicare Other | Source: Ambulatory Visit | Attending: Radiation Oncology | Admitting: Radiation Oncology

## 2022-03-02 ENCOUNTER — Ambulatory Visit: Payer: Medicare Other

## 2022-03-02 DIAGNOSIS — Z87891 Personal history of nicotine dependence: Secondary | ICD-10-CM | POA: Diagnosis not present

## 2022-03-02 DIAGNOSIS — C3411 Malignant neoplasm of upper lobe, right bronchus or lung: Secondary | ICD-10-CM | POA: Diagnosis not present

## 2022-03-02 DIAGNOSIS — Z510 Encounter for antineoplastic radiation therapy: Secondary | ICD-10-CM | POA: Diagnosis not present

## 2022-03-02 DIAGNOSIS — E860 Dehydration: Secondary | ICD-10-CM

## 2022-03-02 LAB — BASIC METABOLIC PANEL
Anion gap: 9 (ref 5–15)
BUN: 14 mg/dL (ref 8–23)
CO2: 29 mmol/L (ref 22–32)
Calcium: 8.5 mg/dL — ABNORMAL LOW (ref 8.9–10.3)
Chloride: 102 mmol/L (ref 98–111)
Creatinine, Ser: 0.95 mg/dL (ref 0.61–1.24)
GFR, Estimated: >60 mL/min (ref >60)
Glucose, Bld: 107 mg/dL — ABNORMAL HIGH (ref 70–99)
Potassium: 3 mmol/L — ABNORMAL LOW (ref 3.5–5.1)
Sodium: 140 mmol/L (ref 135–145)

## 2022-03-02 LAB — CBC WITH DIFFERENTIAL/PLATELET
Abs Immature Granulocytes: 0.16 10*3/uL — ABNORMAL HIGH (ref 0.00–0.07)
Basophils Absolute: 0.1 10*3/uL (ref 0.0–0.1)
Basophils Relative: 1 %
Eosinophils Absolute: 0 10*3/uL (ref 0.0–0.5)
Eosinophils Relative: 0 %
HCT: 35.1 % — ABNORMAL LOW (ref 39.0–52.0)
Hemoglobin: 10.7 g/dL — ABNORMAL LOW (ref 13.0–17.0)
Immature Granulocytes: 1 %
Lymphocytes Relative: 4 %
Lymphs Abs: 0.5 10*3/uL — ABNORMAL LOW (ref 0.7–4.0)
MCH: 27.4 pg (ref 26.0–34.0)
MCHC: 30.5 g/dL (ref 30.0–36.0)
MCV: 90 fL (ref 80.0–100.0)
Monocytes Absolute: 1.7 10*3/uL — ABNORMAL HIGH (ref 0.1–1.0)
Monocytes Relative: 14 %
Neutro Abs: 9.9 10*3/uL — ABNORMAL HIGH (ref 1.7–7.7)
Neutrophils Relative %: 80 %
Platelets: 559 10*3/uL — ABNORMAL HIGH (ref 150–400)
RBC: 3.9 MIL/uL — ABNORMAL LOW (ref 4.22–5.81)
RDW: 18 % — ABNORMAL HIGH (ref 11.5–15.5)
WBC: 12.4 10*3/uL — ABNORMAL HIGH (ref 4.0–10.5)
nRBC: 0 % (ref 0.0–0.2)

## 2022-03-02 LAB — RAD ONC ARIA SESSION SUMMARY
Course Elapsed Days: 14
Plan Fractions Treated to Date: 11
Plan Prescribed Dose Per Fraction: 1.8 Gy
Plan Total Fractions Prescribed: 33
Plan Total Prescribed Dose: 59.4 Gy
Reference Point Dosage Given to Date: 19.8 Gy
Reference Point Session Dosage Given: 1.8 Gy
Session Number: 11

## 2022-03-02 LAB — GLUCOSE, CAPILLARY
Glucose-Capillary: 114 mg/dL — ABNORMAL HIGH (ref 70–99)
Glucose-Capillary: 194 mg/dL — ABNORMAL HIGH (ref 70–99)
Glucose-Capillary: 121 mg/dL — ABNORMAL HIGH (ref 70–99)
Glucose-Capillary: 185 mg/dL — ABNORMAL HIGH (ref 70–99)

## 2022-03-02 LAB — MAGNESIUM: Magnesium: 2.3 mg/dL (ref 1.7–2.4)

## 2022-03-02 LAB — PROTIME-INR
INR: 1.6 — ABNORMAL HIGH (ref 0.8–1.2)
Prothrombin Time: 18.5 seconds — ABNORMAL HIGH (ref 11.4–15.2)

## 2022-03-02 MED ORDER — POTASSIUM CHLORIDE CRYS ER 20 MEQ PO TBCR
60.0000 meq | EXTENDED_RELEASE_TABLET | ORAL | Status: AC
  Administered 2022-03-02 (×2): 60 meq via ORAL
  Filled 2022-03-02 (×2): qty 3

## 2022-03-02 NOTE — Progress Notes (Signed)
Progress Note   Patient: Jared Stevens [id number] DOB: July 27, 1950 DOA: 02/26/2022     2 DOS: the patient was seen and examined on 03/02/2022   Brief hospital course: 71 yo male with PMH recurrent NSCLC (RUL), chronic combined systolic/diastolic CHF, DMII who presented to the ER with ongoing weakness and weight loss. He was recently started back on chemotherapy and radiation after being diagnosed with recurrent lung cancer found on PET scan in July 2023. He continues to have ongoing weight loss and weakness.  His quality of life has been declining.  He was attempted to be discharged home from the ER but refused discharge due to how weak and poorly he felt. Further issues found on work-up were a labile INR which was elevated.  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   Palliative care was consulted on admission.  Assessment and Plan: * Adult failure to thrive - Patient appears to have ongoing progressive functional decline especially after being restarted on chemotherapy and radiation for recurrent lung cancer. -He was too weak for going home from the ER and therefore is continued on monitoring in the hospital while we have further [place] discussions with palliative care and family.  Initial concerns about failure to thrive and continuous decline with associated severe cachexia -Palliative Care following. Pt also seen by Oncology who had recommended continued chemo - PT/OT consulted.Plans for SNF, TOC following   Non-small cell lung cancer, right ([place]) - Recurrent non-small cell lung cancer adenocarcinoma initially diagnosed 2017.  Found to have recurrence in July 2023 on PET scan. -Patient endorses poor appetite at home with ongoing weight loss and progressive weakness; he has been started on chemoradiation -See failure to thrive -Palliative Care following. Also seen by Oncology who had recommended continued chemo as early as next week   Supratherapeutic  INR - labile INR - on coumadin due to his MCA stroke s/p thrombectomy - likely can convert to a DOAC soon   AKI (acute kidney injury) ([place]) - Suspected due to his ongoing poor intake and weight loss - He was given fluid resuscitation in the ER as well.   -Cr improved -recheck bmet in AM   Hypokalemia - will replace -recheck bmet in AM   Type 2 diabetes mellitus with hyperglycemia, without long-term current use of insulin (HCC) - A1c 5.9% - Continue SSI and CBG monitoring   Chronic combined systolic and diastolic CHF (congestive heart failure) (HCC) Intravascularly dry. Not currently exacerbated       Subjective: Complains of difficulty sleeping overnight because of being too bright.   Physical Exam: Vitals:   03/01/22 1735 03/01/22 2050 03/02/22 0059 03/02/22 0329  BP: (!) 95/59 119/74 92/60 99/69   Pulse: (!) 103 (!) 110 (!) 110 97  Resp: 16 18 18 18   Temp: 99.9 F (37.7 C) 98.6 F (37 C) 99.2 F (37.3 C) 98.1 F (36.7 C)  TempSrc: Oral Oral Oral Oral  SpO2: 97% 97% 99% 98%  Weight:      Height:       General exam: Awake, sitting in chair in nad, appears thin  Respiratory system: Normal respiratory effort, no wheezing Cardiovascular system: regular rate, s1, s2 Gastrointestinal system: Soft, nondistended, positive BS Central nervous system: CN2-12 grossly intact, strength intact Extremities: Perfused, no clubbing Skin: Normal skin turgor, no notable skin lesions seen Psychiatry: Mood normal // no visual hallucinations   Data Reviewed:  Labs reviewed: Na 140 K 3.0, Cr 0.95  Family Communication:  Pt in room, family not at bedside  Disposition: Status is: Inpatient Remains inpatient appropriate because: Severity of illness  Planned Discharge Destination: Skilled nursing facility     Author: Marylu Lund, MD 03/02/2022 2:40 PM  For on call review [email or web address].

## 2022-03-02 NOTE — TOC Progression Note (Addendum)
Transition of Care [facility]) - Progression Note    Patient Details  Name: Jared Stevens MRN: [id number] Date of Birth: [birth year]  Transition of Care [facility]) CM/SW Contact  Leeroy Cha, RN Phone Number: 03/02/2022, 10:39 AM  Clinical Narrative:    Phoebe Perch sent to rea snf for review. Tct-dtg to give snf options message left to please call back. Tcf-dtg/gave names and addresses for the facilities that have made bed offers.  Will review and call me back with choice. Expected Discharge Plan: [place] Barriers to Discharge: Continued Medical Work up  Expected Discharge Plan and Services Expected Discharge Plan: [place]   Discharge Planning Services: CM Consult   Living arrangements for the past 2 months: Apartment                                       Social Determinants of Health (SDOH) Interventions    Readmission Risk Interventions   No data to display

## 2022-03-02 NOTE — NC FL2 (Signed)
[place] MEDICAID FL2 LEVEL OF CARE SCREENING TOOL     IDENTIFICATION  Patient Name: Jared Stevens Birthdate: 1951-04-18 Sex: male Admission Date (Current Location): 02/26/2022  [facility] and [place] Number:  Herbalist and Address:  [facility],  [address] [place], [place]      Provider Number: [id number]  Attending Physician Name and Address:  Donne Hazel, MD  Relative Name and Phone Number:       Current Level of Care: Hospital Recommended Level of Care: [place] Prior Approval Number:    Date Approved/Denied:   PASRR Number: [id number]  Discharge Plan: SNF    Current Diagnoses: Patient Active Problem List   Diagnosis Date Noted   Adult failure to thrive 02/27/2022   AKI (acute kidney injury) ([place]) 02/27/2022   Supratherapeutic INR 02/27/2022   Hypokalemia 02/27/2022   Adenocarcinoma of right lung, stage 3 ([place]) 01/05/2022   Encounter for antineoplastic chemotherapy 01/05/2022   Lung mass 12/21/2021   Type 2 diabetes mellitus with hyperglycemia, without long-term current use of insulin ([place]) 11/23/2021   Peripheral neuropathy due to chemotherapy ([place]) 07/22/2021   CAD (coronary artery disease) 05/11/2021   Benign prostatic hyperplasia without lower urinary tract symptoms 01/19/2021   Inguinal hernia of left side without obstruction or gangrene 01/19/2021   Long term current use of anticoagulant therapy 01/01/2020   Chronic combined systolic and diastolic CHF (congestive heart failure) ([place]) 10/31/2019   Neurocognitive deficits 11/06/2018   History of CVA with residual deficit 10/21/2018   Middle cerebral artery embolism, right 10/21/2018   Recurrent right pleural effusion    History of lung cancer 09/27/2018   Elevated brain natriuretic peptide (BNP) level 09/27/2018   COPD (chronic obstructive pulmonary disease) ([place]) 09/27/2018   Hyperlipidemia 09/27/2018   Non-small cell lung cancer, right ([place]) 08/26/2018     Orientation RESPIRATION BLADDER Height & Weight     Self, Time, Situation, Place  Normal Continent Weight: 56.6 kg Height:  6\' 1"  (185.4 cm)  BEHAVIORAL SYMPTOMS/MOOD NEUROLOGICAL BOWEL NUTRITION STATUS      Continent Diet (regular)  AMBULATORY STATUS COMMUNICATION OF NEEDS Skin   Supervision Verbally Normal                       Personal Care Assistance Level of Assistance  Bathing, Feeding, Dressing Bathing Assistance: Limited assistance Feeding assistance: Limited assistance Dressing Assistance: Limited assistance     Functional Limitations Info  Sight, Hearing, Speech Sight Info: Adequate Hearing Info: Adequate Speech Info: Adequate    SPECIAL CARE FACTORS FREQUENCY  PT (By licensed PT), OT (By licensed OT)     PT Frequency: 5 x weekly OT Frequency: 5 x weekly            Contractures Contractures Info: Not present    Additional Factors Info  Code Status Code Status Info: FULL             Current Medications (03/02/2022):  This is the current hospital active medication list Current Facility-Administered Medications  Medication Dose Route Frequency Provider Last Rate Last Admin   acetaminophen (TYLENOL) tablet 650 mg  650 mg Oral Q4H PRN Dwyane Dee, MD   650 mg at 03/02/22 0001   Or   acetaminophen (TYLENOL) suppository 650 mg  650 mg Rectal Q4H PRN Dwyane Dee, MD       insulin aspart (novoLOG) injection 0-5 Units  0-5 Units Subcutaneous QHS Dwyane Dee, MD  insulin aspart (novoLOG) injection 0-6 Units  0-6 Units Subcutaneous TID WC Dwyane Dee, MD   1 Units at 03/01/22 1650   oxyCODONE (Oxy IR/ROXICODONE) immediate release tablet 5 mg  5 mg Oral Q4H PRN Dwyane Dee, MD   5 mg at 03/02/22 0845   potassium chloride SA (KLOR-CON M) CR tablet 60 mEq  60 mEq Oral Q4H Donne Hazel, MD   60 mEq at 03/02/22 0845   sodium chloride flush (NS) 0.9 % injection 3 mL  3 mL Intravenous Eddie Candle, MD   3 mL at 02/28/22 0815    zolpidem (AMBIEN) tablet 5 mg  5 mg Oral QHS PRN Donne Hazel, MD   5 mg at 03/01/22 2134     Discharge Medications: Please see discharge summary for a list of discharge medications.  Relevant Imaging Results:  Relevant Lab Results:   Additional Information SS#[phone], [place]

## 2022-03-03 ENCOUNTER — Ambulatory Visit
Admission: RE | Admit: 2022-03-03 | Discharge: 2022-03-03 | Disposition: A | Discharge status: Home / Self Care | Payer: Medicare Other | Source: Ambulatory Visit | Attending: Radiation Oncology | Admitting: Radiation Oncology

## 2022-03-03 ENCOUNTER — Other Ambulatory Visit: Payer: Self-pay

## 2022-03-03 DIAGNOSIS — Z510 Encounter for antineoplastic radiation therapy: Secondary | ICD-10-CM | POA: Diagnosis not present

## 2022-03-03 DIAGNOSIS — C3411 Malignant neoplasm of upper lobe, right bronchus or lung: Secondary | ICD-10-CM | POA: Diagnosis not present

## 2022-03-03 DIAGNOSIS — Z87891 Personal history of nicotine dependence: Secondary | ICD-10-CM | POA: Diagnosis not present

## 2022-03-03 LAB — RAD ONC ARIA SESSION SUMMARY
Course Elapsed Days: 15
Plan Fractions Treated to Date: 12
Plan Prescribed Dose Per Fraction: 1.8 Gy
Plan Total Fractions Prescribed: 33
Plan Total Prescribed Dose: 59.4 Gy
Reference Point Dosage Given to Date: 21.6 Gy
Reference Point Session Dosage Given: 1.8 Gy
Session Number: 12

## 2022-03-03 LAB — CBC WITH DIFFERENTIAL/PLATELET
Abs Immature Granulocytes: 0.28 10*3/uL — ABNORMAL HIGH (ref 0.00–0.07)
Basophils Absolute: 0.1 10*3/uL (ref 0.0–0.1)
Basophils Relative: 1 %
Eosinophils Absolute: 0 10*3/uL (ref 0.0–0.5)
Eosinophils Relative: 0 %
HCT: 37.3 % — ABNORMAL LOW (ref 39.0–52.0)
Hemoglobin: 12.1 g/dL — ABNORMAL LOW (ref 13.0–17.0)
Immature Granulocytes: 2 %
Lymphocytes Relative: 5 %
Lymphs Abs: 0.6 10*3/uL — ABNORMAL LOW (ref 0.7–4.0)
MCH: 27.9 pg (ref 26.0–34.0)
MCHC: 32.4 g/dL (ref 30.0–36.0)
MCV: 85.9 fL (ref 80.0–100.0)
Monocytes Absolute: 1.4 10*3/uL — ABNORMAL HIGH (ref 0.1–1.0)
Monocytes Relative: 12 %
Neutro Abs: 9.6 10*3/uL — ABNORMAL HIGH (ref 1.7–7.7)
Neutrophils Relative %: 80 %
Platelets: 567 10*3/uL — ABNORMAL HIGH (ref 150–400)
RBC: 4.34 MIL/uL (ref 4.22–5.81)
RDW: 17.9 % — ABNORMAL HIGH (ref 11.5–15.5)
WBC: 12 10*3/uL — ABNORMAL HIGH (ref 4.0–10.5)
nRBC: 0 % (ref 0.0–0.2)

## 2022-03-03 LAB — PROTIME-INR
INR: 1.4 — ABNORMAL HIGH (ref 0.8–1.2)
Prothrombin Time: 17.2 seconds — ABNORMAL HIGH (ref 11.4–15.2)

## 2022-03-03 LAB — BASIC METABOLIC PANEL
Anion gap: 9 (ref 5–15)
BUN: 13 mg/dL (ref 8–23)
CO2: 27 mmol/L (ref 22–32)
Calcium: 8.5 mg/dL — ABNORMAL LOW (ref 8.9–10.3)
Chloride: 101 mmol/L (ref 98–111)
Creatinine, Ser: 0.94 mg/dL (ref 0.61–1.24)
GFR, Estimated: >60 mL/min (ref >60)
Glucose, Bld: 120 mg/dL — ABNORMAL HIGH (ref 70–99)
Potassium: 3.3 mmol/L — ABNORMAL LOW (ref 3.5–5.1)
Sodium: 137 mmol/L (ref 135–145)

## 2022-03-03 LAB — GLUCOSE, CAPILLARY
Glucose-Capillary: 106 mg/dL — ABNORMAL HIGH (ref 70–99)
Glucose-Capillary: 127 mg/dL — ABNORMAL HIGH (ref 70–99)
Glucose-Capillary: 99 mg/dL (ref 70–99)
Glucose-Capillary: 108 mg/dL — ABNORMAL HIGH (ref 70–99)

## 2022-03-03 LAB — MAGNESIUM: Magnesium: 2.2 mg/dL (ref 1.7–2.4)

## 2022-03-03 MED ORDER — APIXABAN 5 MG PO TABS
5.0000 mg | ORAL_TABLET | Freq: Two times a day (BID) | ORAL | Status: DC
  Administered 2022-03-03 – 2022-03-04 (×2): 5 mg via ORAL
  Filled 2022-03-03 (×2): qty 1

## 2022-03-03 MED ORDER — POLYETHYLENE GLYCOL 3350 17 G PO PACK
17.0000 g | PACK | Freq: Once | ORAL | Status: AC
  Administered 2022-03-03: 17 g via ORAL
  Filled 2022-03-03: qty 1

## 2022-03-03 MED ORDER — POTASSIUM CHLORIDE CRYS ER 20 MEQ PO TBCR
20.0000 meq | EXTENDED_RELEASE_TABLET | ORAL | Status: AC
  Administered 2022-03-03 (×2): 20 meq via ORAL
  Filled 2022-03-03 (×2): qty 1

## 2022-03-03 MED ORDER — POTASSIUM CHLORIDE 10 MEQ/100ML IV SOLN
10.0000 meq | INTRAVENOUS | Status: AC
  Administered 2022-03-03 (×4): 10 meq via INTRAVENOUS
  Filled 2022-03-03 (×2): qty 100

## 2022-03-03 MED FILL — Dexamethasone Sodium Phosphate Inj 100 MG/10ML: INTRAMUSCULAR | Qty: 1 | Status: AC

## 2022-03-03 NOTE — Progress Notes (Signed)
Progress Note   Patient: Mikey Maffett [id number] DOB: Oct 12, 1950 DOA: 02/26/2022     3 DOS: the patient was seen and examined on 03/03/2022   Brief hospital course: 71 yo male with PMH recurrent NSCLC (RUL), chronic combined systolic/diastolic CHF, DMII who presented to the ER with ongoing weakness and weight loss. He was recently started back on chemotherapy and radiation after being diagnosed with recurrent lung cancer found on PET scan in July 2023. He continues to have ongoing weight loss and weakness.  His quality of life has been declining.  He was attempted to be discharged home from the ER but refused discharge due to how weak and poorly he felt. Further issues found on work-up were a labile INR which was elevated.  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   Palliative care was consulted on admission.  Assessment and Plan: * Adult failure to thrive - Patient appears to have ongoing progressive functional decline especially after being restarted on chemotherapy and radiation for recurrent lung cancer. -He was too weak for going home from the ER and therefore is continued on monitoring in the hospital while we have further [place] discussions with palliative care and family.  Initial concerns about failure to thrive and continuous decline with associated severe cachexia -Palliative Care following. Pt also seen by Oncology who had recommended continued chemo - PT/OT consulted.Plans for SNF, TOC cont to follow   Non-small cell lung cancer, right ([place]) - Recurrent non-small cell lung cancer adenocarcinoma initially diagnosed 2017.  Found to have recurrence in July 2023 on PET scan. -Patient endorses poor appetite at home with ongoing weight loss and progressive weakness; he has been started on chemoradiation -See failure to thrive -Palliative Care following. Also seen by Oncology who had recommended continued chemo as early as next week   Supratherapeutic  INR - labile INR - on coumadin due to his MCA stroke s/p thrombectomy - Will start eliquis   AKI (acute kidney injury) ([place]) - Suspected due to his ongoing poor intake and weight loss - He was given fluid resuscitation in the ER as well.   -Cr improved -cont follow bmet trends   Hypokalemia - will replace -recheck bmet in AM   Type 2 diabetes mellitus with hyperglycemia, without long-term current use of insulin (HCC) - A1c 5.9% - Continue SSI and CBG monitoring   Chronic combined systolic and diastolic CHF (congestive heart failure) (HCC) Intravascularly dry. Not currently exacerbated       Subjective: Complains of abd discomfort. Believes he is constipated  Physical Exam: Vitals:   03/02/22 0329 03/02/22 2056 03/03/22 0442 03/03/22 1338  BP: 99/69 124/73 (!) 100/58 (!) 101/57  Pulse: 97 (!) 108 99 (!) 107  Resp: 18 18 18 18   Temp: 98.1 F (36.7 C) 99.9 F (37.7 C) 98.2 F (36.8 C) 97.8 F (36.6 C)  TempSrc: Oral Oral Oral Oral  SpO2: 98% 95% 96% 96%  Weight:      Height:       General exam: Conversant, in no acute distress Respiratory system: normal chest rise, clear, no audible wheezing Cardiovascular system: regular rhythm, s1-s2 Gastrointestinal system: Nondistended, nontender, pos BS Central nervous system: No seizures, no tremors Extremities: No cyanosis, no joint deformities Skin: No rashes, no pallor Psychiatry: Affect normal // no auditory hallucinations   Data Reviewed:  Labs reviewed: Na 137, K 3.3, Cr 0.94  Family Communication: Pt in room, family not at bedside  Disposition: Status is:  Inpatient Remains inpatient appropriate because: Severity of illness  Planned Discharge Destination: Skilled nursing facility     Author: Marylu Lund, MD 03/03/2022 4:19 PM  For on call review [email or web address].

## 2022-03-03 NOTE — Progress Notes (Signed)
[place] for Eliquis Indication: hx stroke  No Known Allergies  Patient Measurements: Height: 6\' 1"  (185.4 cm) Weight: 56.6 kg (124 lb 12.5 oz) IBW/kg (Calculated) : 79.9 Heparin Dosing Weight:   Vital Signs: Temp: 97.8 F (36.6 C) (10/06 1338) Temp Source: Oral (10/06 1338) BP: 101/57 (10/06 1338) Pulse Rate: 107 (10/06 1338)  Labs: Recent Labs    03/01/22 0701 03/02/22 0658 03/03/22 0652  HGB 11.7* 10.7* 12.1*  HCT 36.4* 35.1* 37.3*  PLT 519* 559* 567*  LABPROT 20.5* 18.5* 17.2*  INR 1.8* 1.6* 1.4*  CREATININE 1.12 0.95 0.94    Estimated Creatinine Clearance: 58.5 mL/min (by C-G formula based on SCr of 0.94 mg/dL).   Medications:  - on warfarin PTA  Assessment: Patient is a 71 y.o M with hx recurrent NSCLC and CVA on warfarin PTA who presented to the ED on 02/26/22 s/p fall and with c/o generalized weakness.  INR was supra-therapeutic on admission at 4.4 9 (on 02/27/22).  With difficulty in regulating INR while on warfarin, pharmacy has been consulted to transition pt to Eliquis.  Today, 03/03/2022: - INR 1.4 - cbc stable - no bleeding documented    Plan:  - Eliquis 5 mg bid - pharmacy will sign off for Eliquis, but will follow pt peripherally along with you.  Re-consult [place] if need further assistance.  Shourya Macpherson P 03/03/2022,4:28 PM

## 2022-03-03 NOTE — Plan of Care (Signed)
Pt reporting abd pain 9/10 this am.  PRN administered per MAR.  Problem: Pain Managment: Goal: General experience of comfort will improve 03/03/2022 0843 by Hewitt Blade, Matix Henshaw D, RN Outcome: Not Progressing 03/03/2022 0843 by Hewitt Blade, Reginald Weida D, RN Outcome: Progressing   Problem: Education: Goal: Ability to describe self-care measures that may prevent or decrease complications (Diabetes Survival Skills Education) will improve 03/03/2022 0843 by Hewitt Blade, Tonnya Garbett D, RN Outcome: Progressing 03/03/2022 0843 by Hewitt Blade, Allyne Hebert D, RN Outcome: Progressing Goal: Individualized Educational Video(s) 03/03/2022 0843 by Hewitt Blade, Betsi Crespi D, RN Outcome: Progressing 03/03/2022 0843 by Hewitt Blade, Kendallyn Lippold D, RN Outcome: Progressing   Problem: Coping: Goal: Ability to adjust to condition or change in health will improve 03/03/2022 0843 by Hewitt Blade, Jerney Baksh D, RN Outcome: Progressing 03/03/2022 0843 by Hewitt Blade, Bohdi Leeds D, RN Outcome: Progressing   Problem: Fluid Volume: Goal: Ability to maintain a balanced intake and output will improve 03/03/2022 0843 by Hewitt Blade, Petra Dumler D, RN Outcome: Progressing 03/03/2022 0843 by Hewitt Blade, Chinedu Agustin D, RN Outcome: Progressing   Problem: Health Behavior/Discharge Planning: Goal: Ability to identify and utilize available resources and services will improve 03/03/2022 0843 by Hewitt Blade, Tracye Szuch D, RN Outcome: Progressing 03/03/2022 0843 by Hewitt Blade, Shigeru Lampert D, RN Outcome: Progressing Goal: Ability to manage health-related needs will improve 03/03/2022 0843 by Hewitt Blade, Estel Scholze D, RN Outcome: Progressing 03/03/2022 0843 by Hewitt Blade, Avir Deruiter D, RN Outcome: Progressing   Problem: Metabolic: Goal: Ability to maintain appropriate glucose levels will improve 03/03/2022 0843 by Hewitt Blade, Theone Bowell D, RN Outcome: Progressing 03/03/2022 0843 by Hewitt Blade, Kamea Dacosta D,  RN Outcome: Progressing   Problem: Nutritional: Goal: Maintenance of adequate nutrition will improve 03/03/2022 0843 by Hewitt Blade, Ramadan Couey D, RN Outcome: Progressing 03/03/2022 0843 by Hewitt Blade, Lavine Hargrove D, RN Outcome: Progressing Goal: Progress toward achieving an optimal weight will improve 03/03/2022 0843 by Hewitt Blade, Rehan Holness D, RN Outcome: Progressing 03/03/2022 0843 by Hewitt Blade, Armella Stogner D, RN Outcome: Progressing   Problem: Skin Integrity: Goal: Risk for impaired skin integrity will decrease 03/03/2022 0843 by Hewitt Blade, Gerilyn Stargell D, RN Outcome: Progressing 03/03/2022 0843 by Hewitt Blade, Carleton Vanvalkenburgh D, RN Outcome: Progressing   Problem: Tissue Perfusion: Goal: Adequacy of tissue perfusion will improve 03/03/2022 0843 by Hewitt Blade, Alawna Graybeal D, RN Outcome: Progressing 03/03/2022 0843 by Hewitt Blade, Nahima Ales D, RN Outcome: Progressing   Problem: Education: Goal: Knowledge of General Education information will improve Description: Including pain rating scale, medication(s)/side effects and non-pharmacologic comfort measures 03/03/2022 0843 by Hewitt Blade, Chiffon Kittleson D, RN Outcome: Progressing 03/03/2022 0843 by Hewitt Blade, Eshani Maestre D, RN Outcome: Progressing   Problem: Health Behavior/Discharge Planning: Goal: Ability to manage health-related needs will improve 03/03/2022 0843 by Hewitt Blade, Khaliya Golinski D, RN Outcome: Progressing 03/03/2022 0843 by Hewitt Blade, Tung Pustejovsky D, RN Outcome: Progressing   Problem: Clinical Measurements: Goal: Ability to maintain clinical measurements within normal limits will improve 03/03/2022 0843 by Hewitt Blade, Keldan Eplin D, RN Outcome: Progressing 03/03/2022 0843 by Hewitt Blade, Kinzlie Harney D, RN Outcome: Progressing Goal: Will remain free from infection 03/03/2022 0843 by Hewitt Blade, Osamah Schmader D, RN Outcome: Progressing 03/03/2022 0843 by Hewitt Blade, Erian Lariviere D, RN Outcome:  Progressing Goal: Diagnostic test results will improve 03/03/2022 0843 by Hewitt Blade, Airyonna Franklyn D, RN Outcome: Progressing 03/03/2022 0843 by Hewitt Blade, Deazia Lampi D, RN Outcome: Progressing Goal: Respiratory complications will improve 03/03/2022 0843 by Hewitt Blade, Brittian Renaldo D, RN Outcome: Progressing 03/03/2022 0843 by Hewitt Blade, Daily Doe D, RN Outcome: Progressing Goal: Cardiovascular  complication will be avoided 03/03/2022 0843 by Hewitt Blade, Veleda Mun D, RN Outcome: Progressing 03/03/2022 0843 by Hewitt Blade, Victory Strollo D, RN Outcome: Progressing   Problem: Activity: Goal: Risk for activity intolerance will decrease 03/03/2022 0843 by Hewitt Blade, Hennessey Cantrell D, RN Outcome: Progressing 03/03/2022 0843 by Hewitt Blade, Chundra Sauerwein D, RN Outcome: Progressing   Problem: Nutrition: Goal: Adequate nutrition will be maintained 03/03/2022 0843 by Hewitt Blade, Taccara Bushnell D, RN Outcome: Progressing 03/03/2022 0843 by Hewitt Blade, Carthel Castille D, RN Outcome: Progressing   Problem: Coping: Goal: Level of anxiety will decrease 03/03/2022 0843 by Hewitt Blade, Minami Arriaga D, RN Outcome: Progressing 03/03/2022 0843 by Hewitt Blade, Jaylan Hinojosa D, RN Outcome: Progressing   Problem: Elimination: Goal: Will not experience complications related to bowel motility 03/03/2022 0843 by Hewitt Blade, Preslie Depasquale D, RN Outcome: Progressing 03/03/2022 0843 by Hewitt Blade, Jassiah Viviano D, RN Outcome: Progressing Goal: Will not experience complications related to urinary retention 03/03/2022 0843 by Hewitt Blade, Theta Leaf D, RN Outcome: Progressing 03/03/2022 0843 by Hewitt Blade, Sorah Falkenstein D, RN Outcome: Progressing   Problem: Safety: Goal: Ability to remain free from injury will improve 03/03/2022 0843 by Hewitt Blade, Taliyah Watrous D, RN Outcome: Progressing 03/03/2022 0843 by Hewitt Blade, Jahseh Lucchese D, RN Outcome: Progressing   Problem: Skin Integrity: Goal: Risk for impaired skin  integrity will decrease 03/03/2022 0843 by Hewitt Blade, Melannie Metzner D, RN Outcome: Progressing 03/03/2022 0843 by Hewitt Blade, Rateel Beldin D, RN Outcome: Progressing

## 2022-03-03 NOTE — Care Management Important Message (Signed)
Important Message  Patient Details IM Letter placed in Patients room. Name: Jared Stevens MRN: [id number] Date of Birth: [birth year]   Medicare Important Message Given:  Yes     [name] 03/03/2022, 12:40 PM

## 2022-03-03 NOTE — TOC Progression Note (Signed)
Transition of Care [facility]) - Progression Note    Patient Details  Name: Jared Stevens MRN: [id number] Date of Birth: May 24, 1951  Transition of Care [facility]) CM/SW Contact  Leeroy Cha, RN Phone Number: 03/03/2022, 10:47 AM  Clinical Narrative:    [id number] tct-daughter-message left to please call back   Expected Discharge Plan: [place] Barriers to Discharge: Continued Medical Work up  Expected Discharge Plan and Services Expected Discharge Plan: [place]   Discharge Planning Services: CM Consult   Living arrangements for the past 2 months: Apartment                                       Social Determinants of Health (SDOH) Interventions    Readmission Risk Interventions   No data to display

## 2022-03-04 DIAGNOSIS — Z1159 Encounter for screening for other viral diseases: Secondary | ICD-10-CM | POA: Diagnosis not present

## 2022-03-04 DIAGNOSIS — I69828 Other speech and language deficits following other cerebrovascular disease: Secondary | ICD-10-CM | POA: Diagnosis not present

## 2022-03-04 DIAGNOSIS — R6520 Severe sepsis without septic shock: Secondary | ICD-10-CM | POA: Diagnosis not present

## 2022-03-04 DIAGNOSIS — N1831 Chronic kidney disease, stage 3a: Secondary | ICD-10-CM | POA: Diagnosis not present

## 2022-03-04 DIAGNOSIS — E43 Unspecified severe protein-calorie malnutrition: Secondary | ICD-10-CM | POA: Diagnosis not present

## 2022-03-04 DIAGNOSIS — J918 Pleural effusion in other conditions classified elsewhere: Secondary | ICD-10-CM | POA: Diagnosis not present

## 2022-03-04 DIAGNOSIS — C3490 Malignant neoplasm of unspecified part of unspecified bronchus or lung: Secondary | ICD-10-CM | POA: Diagnosis not present

## 2022-03-04 DIAGNOSIS — N179 Acute kidney failure, unspecified: Secondary | ICD-10-CM | POA: Diagnosis not present

## 2022-03-04 DIAGNOSIS — R079 Chest pain, unspecified: Secondary | ICD-10-CM | POA: Diagnosis not present

## 2022-03-04 DIAGNOSIS — C3491 Malignant neoplasm of unspecified part of right bronchus or lung: Secondary | ICD-10-CM | POA: Diagnosis not present

## 2022-03-04 DIAGNOSIS — R2689 Other abnormalities of gait and mobility: Secondary | ICD-10-CM | POA: Diagnosis not present

## 2022-03-04 DIAGNOSIS — G9340 Encephalopathy, unspecified: Secondary | ICD-10-CM | POA: Diagnosis not present

## 2022-03-04 DIAGNOSIS — Z681 Body mass index (BMI) 19 or less, adult: Secondary | ICD-10-CM | POA: Diagnosis not present

## 2022-03-04 DIAGNOSIS — Z66 Do not resuscitate: Secondary | ICD-10-CM | POA: Diagnosis not present

## 2022-03-04 DIAGNOSIS — Z87891 Personal history of nicotine dependence: Secondary | ICD-10-CM | POA: Diagnosis not present

## 2022-03-04 DIAGNOSIS — E7800 Pure hypercholesterolemia, unspecified: Secondary | ICD-10-CM | POA: Diagnosis not present

## 2022-03-04 DIAGNOSIS — I469 Cardiac arrest, cause unspecified: Secondary | ICD-10-CM | POA: Diagnosis not present

## 2022-03-04 DIAGNOSIS — Z515 Encounter for palliative care: Secondary | ICD-10-CM | POA: Diagnosis not present

## 2022-03-04 DIAGNOSIS — N138 Other obstructive and reflux uropathy: Secondary | ICD-10-CM | POA: Diagnosis not present

## 2022-03-04 DIAGNOSIS — Z7401 Bed confinement status: Secondary | ICD-10-CM | POA: Diagnosis not present

## 2022-03-04 DIAGNOSIS — D649 Anemia, unspecified: Secondary | ICD-10-CM | POA: Diagnosis not present

## 2022-03-04 DIAGNOSIS — N390 Urinary tract infection, site not specified: Secondary | ICD-10-CM | POA: Diagnosis not present

## 2022-03-04 DIAGNOSIS — R64 Cachexia: Secondary | ICD-10-CM | POA: Diagnosis not present

## 2022-03-04 DIAGNOSIS — G9341 Metabolic encephalopathy: Secondary | ICD-10-CM | POA: Diagnosis not present

## 2022-03-04 DIAGNOSIS — N1330 Unspecified hydronephrosis: Secondary | ICD-10-CM | POA: Diagnosis not present

## 2022-03-04 DIAGNOSIS — I69391 Dysphagia following cerebral infarction: Secondary | ICD-10-CM | POA: Diagnosis not present

## 2022-03-04 DIAGNOSIS — Z85118 Personal history of other malignant neoplasm of bronchus and lung: Secondary | ICD-10-CM | POA: Diagnosis not present

## 2022-03-04 DIAGNOSIS — R0789 Other chest pain: Secondary | ICD-10-CM | POA: Diagnosis not present

## 2022-03-04 DIAGNOSIS — C3411 Malignant neoplasm of upper lobe, right bronchus or lung: Secondary | ICD-10-CM | POA: Diagnosis not present

## 2022-03-04 DIAGNOSIS — I130 Hypertensive heart and chronic kidney disease with heart failure and stage 1 through stage 4 chronic kidney disease, or unspecified chronic kidney disease: Secondary | ICD-10-CM | POA: Diagnosis not present

## 2022-03-04 DIAGNOSIS — A4159 Other Gram-negative sepsis: Secondary | ICD-10-CM | POA: Diagnosis not present

## 2022-03-04 DIAGNOSIS — I69354 Hemiplegia and hemiparesis following cerebral infarction affecting left non-dominant side: Secondary | ICD-10-CM | POA: Diagnosis not present

## 2022-03-04 DIAGNOSIS — Z7901 Long term (current) use of anticoagulants: Secondary | ICD-10-CM | POA: Diagnosis not present

## 2022-03-04 DIAGNOSIS — E876 Hypokalemia: Secondary | ICD-10-CM | POA: Diagnosis not present

## 2022-03-04 DIAGNOSIS — R000 Tachycardia, unspecified: Secondary | ICD-10-CM | POA: Diagnosis not present

## 2022-03-04 DIAGNOSIS — R1312 Dysphagia, oropharyngeal phase: Secondary | ICD-10-CM | POA: Diagnosis not present

## 2022-03-04 DIAGNOSIS — M6281 Muscle weakness (generalized): Secondary | ICD-10-CM | POA: Diagnosis not present

## 2022-03-04 DIAGNOSIS — E785 Hyperlipidemia, unspecified: Secondary | ICD-10-CM | POA: Diagnosis not present

## 2022-03-04 DIAGNOSIS — R338 Other retention of urine: Secondary | ICD-10-CM | POA: Diagnosis not present

## 2022-03-04 DIAGNOSIS — E875 Hyperkalemia: Secondary | ICD-10-CM | POA: Diagnosis not present

## 2022-03-04 DIAGNOSIS — E872 Acidosis, unspecified: Secondary | ICD-10-CM | POA: Diagnosis not present

## 2022-03-04 DIAGNOSIS — R531 Weakness: Secondary | ICD-10-CM | POA: Diagnosis not present

## 2022-03-04 DIAGNOSIS — J449 Chronic obstructive pulmonary disease, unspecified: Secondary | ICD-10-CM | POA: Diagnosis not present

## 2022-03-04 DIAGNOSIS — E1122 Type 2 diabetes mellitus with diabetic chronic kidney disease: Secondary | ICD-10-CM | POA: Diagnosis not present

## 2022-03-04 DIAGNOSIS — I5040 Unspecified combined systolic (congestive) and diastolic (congestive) heart failure: Secondary | ICD-10-CM | POA: Diagnosis not present

## 2022-03-04 DIAGNOSIS — I6601 Occlusion and stenosis of right middle cerebral artery: Secondary | ICD-10-CM | POA: Diagnosis not present

## 2022-03-04 DIAGNOSIS — E46 Unspecified protein-calorie malnutrition: Secondary | ICD-10-CM | POA: Diagnosis not present

## 2022-03-04 DIAGNOSIS — I5042 Chronic combined systolic (congestive) and diastolic (congestive) heart failure: Secondary | ICD-10-CM | POA: Diagnosis not present

## 2022-03-04 DIAGNOSIS — D631 Anemia in chronic kidney disease: Secondary | ICD-10-CM | POA: Diagnosis not present

## 2022-03-04 DIAGNOSIS — E118 Type 2 diabetes mellitus with unspecified complications: Secondary | ICD-10-CM | POA: Diagnosis not present

## 2022-03-04 DIAGNOSIS — R2681 Unsteadiness on feet: Secondary | ICD-10-CM | POA: Diagnosis not present

## 2022-03-04 DIAGNOSIS — Z7189 Other specified counseling: Secondary | ICD-10-CM | POA: Diagnosis not present

## 2022-03-04 DIAGNOSIS — R627 Adult failure to thrive: Secondary | ICD-10-CM | POA: Diagnosis not present

## 2022-03-04 DIAGNOSIS — I429 Cardiomyopathy, unspecified: Secondary | ICD-10-CM | POA: Diagnosis not present

## 2022-03-04 DIAGNOSIS — Z510 Encounter for antineoplastic radiation therapy: Secondary | ICD-10-CM | POA: Diagnosis not present

## 2022-03-04 DIAGNOSIS — E860 Dehydration: Secondary | ICD-10-CM | POA: Diagnosis not present

## 2022-03-04 DIAGNOSIS — I428 Other cardiomyopathies: Secondary | ICD-10-CM | POA: Diagnosis not present

## 2022-03-04 DIAGNOSIS — W19XXXA Unspecified fall, initial encounter: Secondary | ICD-10-CM | POA: Diagnosis not present

## 2022-03-04 LAB — BASIC METABOLIC PANEL
Anion gap: 8 (ref 5–15)
BUN: 20 mg/dL (ref 8–23)
CO2: 27 mmol/L (ref 22–32)
Calcium: 8.5 mg/dL — ABNORMAL LOW (ref 8.9–10.3)
Chloride: 101 mmol/L (ref 98–111)
Creatinine, Ser: 1.17 mg/dL (ref 0.61–1.24)
GFR, Estimated: >60 mL/min (ref >60)
Glucose, Bld: 126 mg/dL — ABNORMAL HIGH (ref 70–99)
Potassium: 3.7 mmol/L (ref 3.5–5.1)
Sodium: 136 mmol/L (ref 135–145)

## 2022-03-04 LAB — GLUCOSE, CAPILLARY
Glucose-Capillary: 122 mg/dL — ABNORMAL HIGH (ref 70–99)
Glucose-Capillary: 132 mg/dL — ABNORMAL HIGH (ref 70–99)
Glucose-Capillary: 119 mg/dL — ABNORMAL HIGH (ref 70–99)

## 2022-03-04 LAB — CBC WITH DIFFERENTIAL/PLATELET
Abs Immature Granulocytes: 0.33 10*3/uL — ABNORMAL HIGH (ref 0.00–0.07)
Basophils Absolute: 0.1 10*3/uL (ref 0.0–0.1)
Basophils Relative: 1 %
Eosinophils Absolute: 0.2 10*3/uL (ref 0.0–0.5)
Eosinophils Relative: 1 %
HCT: 35 % — ABNORMAL LOW (ref 39.0–52.0)
Hemoglobin: 11.7 g/dL — ABNORMAL LOW (ref 13.0–17.0)
Immature Granulocytes: 2 %
Lymphocytes Relative: 2 %
Lymphs Abs: 0.4 10*3/uL — ABNORMAL LOW (ref 0.7–4.0)
MCH: 27.7 pg (ref 26.0–34.0)
MCHC: 33.4 g/dL (ref 30.0–36.0)
MCV: 82.9 fL (ref 80.0–100.0)
Monocytes Absolute: 1.7 10*3/uL — ABNORMAL HIGH (ref 0.1–1.0)
Monocytes Relative: 11 %
Neutro Abs: 12.9 10*3/uL — ABNORMAL HIGH (ref 1.7–7.7)
Neutrophils Relative %: 83 %
Platelets: 577 10*3/uL — ABNORMAL HIGH (ref 150–400)
RBC: 4.22 MIL/uL (ref 4.22–5.81)
RDW: 17.4 % — ABNORMAL HIGH (ref 11.5–15.5)
WBC: 15.6 10*3/uL — ABNORMAL HIGH (ref 4.0–10.5)
nRBC: 0 % (ref 0.0–0.2)

## 2022-03-04 LAB — PROTIME-INR
INR: 1.6 — ABNORMAL HIGH (ref 0.8–1.2)
Prothrombin Time: 18.4 seconds — ABNORMAL HIGH (ref 11.4–15.2)

## 2022-03-04 LAB — MAGNESIUM: Magnesium: 2.2 mg/dL (ref 1.7–2.4)

## 2022-03-04 MED ORDER — POLYETHYLENE GLYCOL 3350 17 G PO PACK: 17.0000 g | PACK | Freq: Every day | ORAL | 0 refills | Status: DC

## 2022-03-04 MED ORDER — DOCUSATE SODIUM 100 MG PO CAPS: 100.0000 mg | ORAL_CAPSULE | Freq: Every day | ORAL | 0 refills | Status: AC

## 2022-03-04 MED ORDER — APIXABAN 5 MG PO TABS: 5.0000 mg | ORAL_TABLET | Freq: Two times a day (BID) | ORAL | 0 refills | Status: DC

## 2022-03-04 MED ORDER — OXYCODONE HCL 5 MG PO TABS: 5.0000 mg | ORAL_TABLET | ORAL | 0 refills | Status: DC | PRN

## 2022-03-04 NOTE — Progress Notes (Signed)
Called [facility] and spoke with Cephus Slater and Frankey Poot RN that is receiving the patient. Report provided, scripts, AVS, and LCSW paper work all in packet awaiting PTAR. Frankey Poot RN notified at receiving facility.

## 2022-03-04 NOTE — TOC Progression Note (Addendum)
Transition of Care [facility]) - Progression Note    Patient Details  Name: Summit Arroyave MRN: [id number] Date of Birth: [birth year]  Transition of Care [facility]) CM/SW Contact  Leeroy Cha, RN Phone Number: 03/04/2022, 10:32 AM  Clinical Narrative:    Daughter hS [facility] for snf and rehab.  Tct-nikki at [place] left to call back. ptar has been called at [phone] for transport packet and report call number are at the nurses station beside the clerk.  dtg has been notified as well.  Expected Discharge Plan: [place] Barriers to Discharge: Continued Medical Work up  Expected Discharge Plan and Services Expected Discharge Plan: [place]   Discharge Planning Services: CM Consult   Living arrangements for the past 2 months: Apartment                                       Social Determinants of Health (SDOH) Interventions    Readmission Risk Interventions   No data to display

## 2022-03-04 NOTE — Progress Notes (Signed)
Physical Therapy Treatment Patient Details Name: Jared Stevens MRN: [id number] DOB: 1951-04-02 Today's Date: 03/04/2022   History of Present Illness 71 yo male with PMH recurrent NSCLC (RUL), chronic combined systolic/diastolic CHF, DMII who presented to the ER with ongoing weakness and weight loss.  He was recently started back on chemotherapy and radiation after being diagnosed with recurrent lung cancer found on PET scan in July 2023.  He continues to have ongoing weight loss and weakness.    PT Comments    Pt AxO x 3 very pleasant and cooperative but feeling weak/tired.  C/O ABD pain pt reporting he had not had a BM for "a while".  Assisted OOB to amb to bathroom required increased time.  General transfer comment: assist to power up, VCs hand placement.  General Gait Details: limited distance to and from bathroom due to weakness/fatigue.  Poor forward flex posture.  shuffled/dragging steps. Pt was able to have a large formed BM.  RN in room at time.  Assisted out of bathroom then positioned in recliner to comfort with multiple pillows.  Pt will need ST Rehab at SNF to address mobility and functional decline prior to safely returning home.    Recommendations for follow up therapy are one component of a multi-disciplinary discharge planning process, led by the attending physician.  Recommendations may be updated based on patient status, additional functional criteria and insurance authorization.  Follow Up Recommendations  Skilled nursing-short term rehab (<3 hours/day) Can patient physically be transported by private vehicle: Yes   Assistance Recommended at Discharge Intermittent Supervision/Assistance  Patient can return home with the following A little help with walking and/or transfers;A little help with bathing/dressing/bathroom;Assistance with cooking/housework;Assist for transportation;Direct supervision/assist for medications management;Help with stairs or ramp for entrance    Equipment Recommendations  Rolling walker (2 wheels)    Recommendations for Other Services       Precautions / Restrictions Precautions Precautions: Fall Precaution Comments: pt fell 02/26/22, denies other falls in past 6 months Restrictions Weight Bearing Restrictions: No     Mobility  Bed Mobility Overal bed mobility: Needs Assistance Bed Mobility: Supine to Sit     Supine to sit: Supervision, Min guard     General bed mobility comments: self able with increased time    Transfers Overall transfer level: Needs assistance Equipment used: Rolling walker (2 wheels) Transfers: Sit to/from Stand Sit to Stand: Min guard, Min assist           General transfer comment: assist to power up, VCs hand placement.  Also assisted with a toilet transfer.    Ambulation/Gait Ambulation/Gait assistance: Min assist Gait Distance (Feet): 16 Feet (8 feet x 2 to and from bathroom) Assistive device: Rolling walker (2 wheels) Gait Pattern/deviations: Step-to pattern, Decreased stride length Gait velocity: decr     General Gait Details: limited distance to and from bathroom due to weakness/fatigue.  Poor forward flex posture.  shuffled/dragging steps.   Stairs             Wheelchair Mobility    Modified Rankin (Stroke Patients Only)       Balance                                            Cognition Arousal/Alertness: Awake/alert Behavior During Therapy: WFL for tasks assessed/performed Overall Cognitive Status: Within Functional Limits for tasks assessed  General Comments: AxO x 3 pleasant/cooperative.  Feeling "weak" and "tired"        Exercises      General Comments        Pertinent Vitals/Pain Pain Assessment Pain Assessment: Faces Faces Pain Scale: Hurts a little bit Pain Location: ABD (constipation pain) Pain Descriptors / Indicators: Aching, Grimacing Pain Intervention(s):  Monitored during session, Repositioned    Home Living                          Prior Function            PT Goals (current goals can now be found in the care plan section) Progress towards PT goals: Progressing toward goals    Frequency    Min 3X/week      PT Plan Current plan remains appropriate    Co-evaluation              AM-PAC PT "6 Clicks" Mobility   Outcome Measure  Help needed turning from your back to your side while in a flat bed without using bedrails?: A Little Help needed moving from lying on your back to sitting on the side of a flat bed without using bedrails?: A Little Help needed moving to and from a bed to a chair (including a wheelchair)?: A Little Help needed standing up from a chair using your arms (e.g., wheelchair or bedside chair)?: A Lot Help needed to walk in hospital room?: A Lot Help needed climbing 3-5 steps with a railing? : Total 6 Click Score: 14    End of Session Equipment Utilized During Treatment: Gait belt Activity Tolerance: Patient limited by fatigue Patient left: in chair;with chair alarm set;with call bell/phone within reach Nurse Communication: Mobility status PT Visit Diagnosis: Difficulty in walking, not elsewhere classified (R26.2);History of falling (Z91.81)     Time: 1104-1130 PT Time Calculation (min) (ACUTE ONLY): 26 min  Charges:  $Gait Training: 8-22 mins $Therapeutic Activity: 8-22 mins                     Rica Koyanagi  PTA [place] Office M-F          [phone] Weekend pager [phone]

## 2022-03-04 NOTE — Discharge Summary (Signed)
Physician Discharge Summary   Patient: Jared Stevens MRN: [id number] DOB: [birth year]  Admit date:     02/26/2022  Discharge date: 03/04/22  Discharge Physician: Marylu Lund   PCP: Flossie Buffy, NP   Recommendations at discharge:   Follow up with PCP in 1 week Follow up with Oncology as scheduled  Recommend Palliative Care referral at facility  Discharge Diagnoses: Principal Problem:   Adult failure to thrive Active Problems:   Non-small cell lung cancer, right (HCC)   AKI (acute kidney injury) ([place])   Supratherapeutic INR   Chronic combined systolic and diastolic CHF (congestive heart failure) ([place])   Type 2 diabetes mellitus with hyperglycemia, without long-term current use of insulin (HCC)   Hypokalemia  Resolved Problems:   * No resolved hospital problems. *  Hospital Course: 71 yo male with PMH recurrent NSCLC (RUL), chronic combined systolic/diastolic CHF, DMII who presented to the ER with ongoing weakness and weight loss. He was recently started back on chemotherapy and radiation after being diagnosed with recurrent lung cancer found on PET scan in July 2023. He continues to have ongoing weight loss and weakness.  His quality of life has been declining.  He was attempted to be discharged home from the ER but refused discharge due to how weak and poorly he felt. Further issues found on work-up were a labile INR which was elevated.  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   Palliative care was consulted on admission.  Assessment and Plan: * Adult failure to thrive - Patient appears to have ongoing progressive functional decline especially after being restarted on chemotherapy and radiation for recurrent lung cancer. -He was too weak for going home from the ER and therefore is continued on monitoring in the hospital while we have further [place] discussions with palliative care and family.  Initial concerns about failure to thrive and  continuous decline with associated severe cachexia -Palliative Care had been following. 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  Found to have recurrence in July 2023 on PET scan. -Patient endorses poor appetite at home with ongoing weight loss and progressive weakness; he has been started on chemoradiation -See failure to thrive -Palliative Care following. Also seen by Oncology who had recommended continued chemo as early as next week   Supratherapeutic INR - labile INR at presentation - Was initially on coumadin due to his MCA stroke s/p thrombectomy - Transitioned to eliquis this visit   AKI (acute kidney injury) ([place]) - Suspected due to his ongoing poor intake and weight loss - He was given fluid resuscitation in the ER as well.   -Cr improved -Encourage PO intake   Hypokalemia - replaced  Type 2 diabetes mellitus with hyperglycemia, without long-term current use of insulin (HCC) - A1c 5.9% -Glycemic trends stable   Chronic combined systolic and diastolic CHF (congestive heart failure) (HCC) Intravascularly dry. Not currently exacerbated       Consultants: Palliative Care, Oncology Procedures performed:   Disposition: Skilled nursing facility Diet recommendation:  Regular diet DISCHARGE MEDICATION: Allergies as of 03/04/2022   No Known Allergies      Medication List     STOP taking these medications    carvedilol 3.125 MG tablet Commonly known as: COREG   digoxin 0.125 MG tablet Commonly known as: LANOXIN  furosemide 40 MG tablet Commonly known as: LASIX   prochlorperazine 10 MG tablet Commonly known as: COMPAZINE   spironolactone 25 MG tablet Commonly known as: ALDACTONE   warfarin 5 MG tablet Commonly known as: COUMADIN       TAKE these medications    albuterol 108  (90 Base) MCG/ACT inhaler Commonly known as: VENTOLIN HFA Inhale 1-2 puffs into the lungs every 6 (six) hours as needed for wheezing or shortness of breath. Patient states he takes only as needed.   apixaban 5 MG Tabs tablet Commonly known as: ELIQUIS Take 1 tablet (5 mg total) by mouth 2 (two) times daily.   budesonide-formoterol 160-4.5 MCG/ACT inhaler Commonly known as: Symbicort INHALE TWO PUFFS INTO THE LUNGS TWICE DAILY (IN THE MORNING AND IN THE EVENING) What changed:  how much to take how to take this when to take this additional instructions   docusate sodium 100 MG capsule Commonly known as: Colace Take 1 capsule (100 mg total) by mouth daily.   finasteride 5 MG tablet Commonly known as: PROSCAR Take 5 mg by mouth daily.   latanoprost 0.005 % ophthalmic solution Commonly known as: XALATAN Place 1 drop into both eyes daily.   ondansetron 8 MG disintegrating tablet Commonly known as: ZOFRAN-ODT 8mg  ODT q4 hours prn nausea   oxyCODONE 5 MG immediate release tablet Commonly known as: Oxy IR/ROXICODONE Take 1 tablet (5 mg total) by mouth every 4 (four) hours as needed for moderate pain, severe pain or breakthrough pain.   polyethylene glycol 17 g packet Commonly known as: MiraLax Take 17 g by mouth daily.   rosuvastatin 40 MG tablet Commonly known as: CRESTOR TAKE ONE TABLET BY MOUTH EVERY DAY   tamsulosin 0.4 MG Caps capsule Commonly known as: FLOMAX Take 0.4 mg by mouth at bedtime.        Follow-up Information     [name], [name], NP Follow up in 1 week(s).   Specialty: Internal Medicine Why: Hospital follow up Contact information: [address] [place] [address] [phone]         Nahser, Wonda Cheng, MD .   Specialty: Cardiology Contact information: [address] [place] [address] [phone]                Discharge Exam: Danley Danker Weights   02/27/22 1446  Weight: 56.6 kg   General exam:  Awake, laying in bed, in nad Respiratory system: Normal respiratory effort, no wheezing Cardiovascular system: regular rate, s1, s2 Gastrointestinal system: Soft, nondistended Central nervous system: CN2-12 grossly intact, strength intact Extremities: Perfused, no clubbing Skin: Normal skin turgor, no notable skin lesions seen Psychiatry: Mood normal // no visual hallucinations   Condition at discharge: fair  The results of significant diagnostics from this hospitalization (including imaging, microbiology, ancillary and laboratory) are listed below for reference.   Imaging Studies: DG Chest Port 1 View  Result Date: 02/27/2022 CLINICAL DATA:  Weakness EXAM: PORTABLE CHEST 1 VIEW COMPARISON:  Chest radiograph dated 12/27/2021. PET-CT dated 11/30/2021. FINDINGS: Masslike opacity along the right paratracheal stripe, favoring progressive tumor when correlating with priors. Associated right upper lobe opacity, likely post obstructive, although lymphangitic tumor is also possible. Volume loss in the right hemithorax. Left lung is clear. No pneumothorax. The heart is normal in size.  Right cardiomediastinal shift. IMPRESSION: Masslike opacity along the right paratracheal stripe, favoring progressive tumor when correlating with priors. Associated right upper lobe opacity, likely post obstructive, although lymphangitic tumor is also possible.  Electronically Signed   By: Julian Hy M.D.   On: 02/27/2022 02:40    Microbiology: Results for orders placed or performed in visit on 12/23/21  SARS Coronavirus 2 (TAT 6-24 hrs)     Status: None   Collection Time: 12/23/21 12:00 AM  Result Value Ref Range Status   SARS Coronavirus 2 RESULT: NEGATIVE  Final    Comment: RESULT: NEGATIVESARS-CoV-2 INTERPRETATION:A NEGATIVE  test result means that SARS-CoV-2 RNA was not present in the specimen above the limit of detection of this test. This does not preclude a possible SARS-CoV-2 infection and should not be  used as the  sole basis for patient management decisions. Negative results must be combined with clinical observations, patient history, and epidemiological information. Optimum specimen types and timing for peak viral levels during infections caused by SARS-CoV-2  have not been determined. Collection of multiple specimens or types of specimens may be necessary to detect virus. Improper specimen collection and handling, sequence variability under primers/probes, or organism present below the limit of detection may  lead to false negative results. Positive and negative predictive values of testing are highly dependent on prevalence. False negative test results are more likely when prevalence of disease is high.The expected result is NEGATIVE.Fact S heet for  Healthcare Providers: LocalChronicle.no Sheet for Patients: SalonLookup.es Reference Range - Negative     Labs: CBC: Recent Labs  Lab 02/28/22 0530 03/01/22 0701 03/02/22 0658 03/03/22 0652 03/04/22 0614  WBC 9.0 10.3 12.4* 12.0* 15.6*  NEUTROABS 7.1 8.4* 9.9* 9.6* 12.9*  HGB 11.8* 11.7* 10.7* 12.1* 11.7*  HCT 36.0* 36.4* 35.1* 37.3* 35.0*  MCV 85.5 86.9 90.0 85.9 82.9  PLT 525* 519* 559* 567* 183*   Basic Metabolic Panel: Recent Labs  Lab 02/28/22 0818 02/28/22 1739 03/01/22 0701 03/02/22 0658 03/03/22 0652 03/04/22 0614  NA 140 137 138 140 137 136  K <2.0* 3.0* 2.5* 3.0* 3.3* 3.7  CL 97* 101 99 102 101 101  CO2 32 28 28 29 27 27   GLUCOSE 175* 114* 106* 107* 120* 126*  BUN 12 15 12 14 13 20   CREATININE 1.26* 1.32* 1.12 0.95 0.94 1.17  CALCIUM 8.7* 8.2* 8.3* 8.5* 8.5* 8.5*  MG 2.4  --  2.4 2.3 2.2 2.2   Liver Function Tests: Recent Labs  Lab 02/27/22 0748  AST 18  ALT 15  ALKPHOS 46  BILITOT 0.6  PROT 4.4*  ALBUMIN <1.5*   CBG: Recent Labs  Lab 03/03/22 1313 03/03/22 1639 03/03/22 2202 03/04/22 0753 03/04/22 1139  GLUCAP 127* 99 108* 122*  132*    Discharge time spent: less than 30 minutes.  Signed: Marylu Lund, MD [facility] 03/04/2022

## 2022-03-06 ENCOUNTER — Inpatient Hospital Stay: Payer: Medicare Other | Admitting: Nutrition

## 2022-03-06 ENCOUNTER — Inpatient Hospital Stay: Payer: Medicare Other

## 2022-03-06 ENCOUNTER — Inpatient Hospital Stay: Payer: Medicare Other | Admitting: Internal Medicine

## 2022-03-06 ENCOUNTER — Other Ambulatory Visit: Payer: Self-pay

## 2022-03-06 ENCOUNTER — Telehealth: Payer: Self-pay

## 2022-03-06 ENCOUNTER — Ambulatory Visit
Admission: RE | Admit: 2022-03-06 | Discharge: 2022-03-06 | Disposition: A | Discharge status: Home / Self Care | Payer: Medicare Other | Source: Ambulatory Visit | Attending: Radiation Oncology | Admitting: Radiation Oncology

## 2022-03-06 DIAGNOSIS — C3491 Malignant neoplasm of unspecified part of right bronchus or lung: Secondary | ICD-10-CM | POA: Diagnosis not present

## 2022-03-06 DIAGNOSIS — C3411 Malignant neoplasm of upper lobe, right bronchus or lung: Secondary | ICD-10-CM | POA: Diagnosis not present

## 2022-03-06 DIAGNOSIS — I429 Cardiomyopathy, unspecified: Secondary | ICD-10-CM | POA: Diagnosis not present

## 2022-03-06 DIAGNOSIS — E46 Unspecified protein-calorie malnutrition: Secondary | ICD-10-CM | POA: Diagnosis not present

## 2022-03-06 DIAGNOSIS — M6281 Muscle weakness (generalized): Secondary | ICD-10-CM | POA: Diagnosis not present

## 2022-03-06 DIAGNOSIS — E118 Type 2 diabetes mellitus with unspecified complications: Secondary | ICD-10-CM | POA: Diagnosis not present

## 2022-03-06 DIAGNOSIS — J449 Chronic obstructive pulmonary disease, unspecified: Secondary | ICD-10-CM | POA: Diagnosis not present

## 2022-03-06 DIAGNOSIS — E43 Unspecified severe protein-calorie malnutrition: Secondary | ICD-10-CM | POA: Diagnosis not present

## 2022-03-06 DIAGNOSIS — Z85118 Personal history of other malignant neoplasm of bronchus and lung: Secondary | ICD-10-CM | POA: Diagnosis not present

## 2022-03-06 DIAGNOSIS — Z510 Encounter for antineoplastic radiation therapy: Secondary | ICD-10-CM | POA: Diagnosis not present

## 2022-03-06 DIAGNOSIS — I69354 Hemiplegia and hemiparesis following cerebral infarction affecting left non-dominant side: Secondary | ICD-10-CM | POA: Diagnosis not present

## 2022-03-06 DIAGNOSIS — E785 Hyperlipidemia, unspecified: Secondary | ICD-10-CM | POA: Diagnosis not present

## 2022-03-06 DIAGNOSIS — I5042 Chronic combined systolic (congestive) and diastolic (congestive) heart failure: Secondary | ICD-10-CM | POA: Diagnosis not present

## 2022-03-06 DIAGNOSIS — Z87891 Personal history of nicotine dependence: Secondary | ICD-10-CM | POA: Diagnosis not present

## 2022-03-06 DIAGNOSIS — N179 Acute kidney failure, unspecified: Secondary | ICD-10-CM | POA: Diagnosis not present

## 2022-03-06 LAB — RAD ONC ARIA SESSION SUMMARY
Course Elapsed Days: 18
Plan Fractions Treated to Date: 13
Plan Prescribed Dose Per Fraction: 1.8 Gy
Plan Total Fractions Prescribed: 33
Plan Total Prescribed Dose: 59.4 Gy
Reference Point Dosage Given to Date: 23.4 Gy
Reference Point Session Dosage Given: 1.8 Gy
Session Number: 13

## 2022-03-06 NOTE — Progress Notes (Signed)
71 year old male diagnosed with recurrent lung cancer followed by Dr. Julien Nordmann.  CURRENT THERAPY: Retreatment with concurrent chemoradiation with weekly carboplatin for AUC of 2 and paclitaxel 45 Mg/M2.  First dose January 16, 2022.  Status post 5 cycles.  Past medical history includes CHF, COPD, CAD, hypercholesterolemia.  Medications include Colace, Zofran, MiraLAX.  Labs include glucose 126 on October 7.  Height: 6 feet 1 inch. Weight: 124 pounds. Usual body weight: 140 pounds. BMI: 16.46.  Patient is currently a resident at [facility].  He denies nausea, vomiting, constipation, and diarrhea.  He has a poor appetite.  Reports he is not doing well at his skilled nursing facility and does not like the food.  States he does not get things he wants.  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  Nutrition diagnosis: Underweight related to recurrent lung cancer as evidenced by BMI of 16.46.  Intervention: I have encouraged patient to contact the registered dietitian at the nursing center to discuss food options.  I have also encouraged him to make his food preferences well-known.   I contacted [facility] and spoke with patient's nurse to request Ensure Plus or equivalent 3 times daily between meals and whole milk at mealtimes.  RN confirmed. Patient seems pleased that he will be able to get some things that he would like to eat. Provided nutrition facts sheets and samples.  Asked him to show samples to nursing staff and request Ensure Plus or equivalent between meals.  Monitoring, evaluation, goals: Patient will tolerate increased calories and protein to minimize further weight loss.  Next visit: To be scheduled as needed.  **Disclaimer: This note was dictated with voice recognition software. Similar sounding words can inadvertently be transcribed and this note may contain transcription errors which may not have been corrected upon  publication of note.**

## 2022-03-06 NOTE — Telephone Encounter (Signed)
Original not written 03/06/22.Marland Kitchen Rewritten in an attempt to sign encounter. Unable to close original encounter.  Transition Care Management Unsuccessful Follow-up Telephone Call  Date of discharge and from where:  03/04/22 [facility]  Attempts:  1st Attempt  Reason for unsuccessful TCM follow-up call:  Left voice message     Transition Care Management Unsuccessful Follow-up Telephone Call  Date of discharge and from where:  03/04/22 [facility] In[facility]  Attempts:  2nd Attempt  Reason for unsuccessful TCM follow-up call:  Unable to reach patient   Pt daughter states that Jared Stevens is at [facility]. Pt not discharged home. Does not qualify for TOC/TCM.

## 2022-03-07 ENCOUNTER — Ambulatory Visit: Payer: Medicare Other | Admitting: Radiation Oncology

## 2022-03-07 DIAGNOSIS — R627 Adult failure to thrive: Secondary | ICD-10-CM | POA: Diagnosis not present

## 2022-03-07 DIAGNOSIS — I69354 Hemiplegia and hemiparesis following cerebral infarction affecting left non-dominant side: Secondary | ICD-10-CM | POA: Diagnosis not present

## 2022-03-07 DIAGNOSIS — Z510 Encounter for antineoplastic radiation therapy: Secondary | ICD-10-CM | POA: Diagnosis not present

## 2022-03-07 DIAGNOSIS — C3411 Malignant neoplasm of upper lobe, right bronchus or lung: Secondary | ICD-10-CM | POA: Diagnosis not present

## 2022-03-07 DIAGNOSIS — N179 Acute kidney failure, unspecified: Secondary | ICD-10-CM | POA: Diagnosis not present

## 2022-03-07 DIAGNOSIS — Z87891 Personal history of nicotine dependence: Secondary | ICD-10-CM | POA: Diagnosis not present

## 2022-03-07 DIAGNOSIS — C3491 Malignant neoplasm of unspecified part of right bronchus or lung: Secondary | ICD-10-CM | POA: Diagnosis not present

## 2022-03-08 ENCOUNTER — Ambulatory Visit
Admission: RE | Admit: 2022-03-08 | Discharge: 2022-03-08 | Disposition: A | Discharge status: Home / Self Care | Payer: Medicare Other | Source: Ambulatory Visit | Attending: Radiation Oncology | Admitting: Radiation Oncology

## 2022-03-08 ENCOUNTER — Other Ambulatory Visit: Payer: Self-pay

## 2022-03-08 DIAGNOSIS — Z87891 Personal history of nicotine dependence: Secondary | ICD-10-CM | POA: Diagnosis not present

## 2022-03-08 DIAGNOSIS — C3411 Malignant neoplasm of upper lobe, right bronchus or lung: Secondary | ICD-10-CM | POA: Diagnosis not present

## 2022-03-08 DIAGNOSIS — Z510 Encounter for antineoplastic radiation therapy: Secondary | ICD-10-CM | POA: Diagnosis not present

## 2022-03-08 LAB — RAD ONC ARIA SESSION SUMMARY
Course Elapsed Days: 20
Plan Fractions Treated to Date: 1
Plan Prescribed Dose Per Fraction: 1.8 Gy
Plan Total Fractions Prescribed: 20
Plan Total Prescribed Dose: 36 Gy
Reference Point Dosage Given to Date: 25.2 Gy
Reference Point Session Dosage Given: 1.8 Gy
Session Number: 14

## 2022-03-09 ENCOUNTER — Ambulatory Visit: Payer: Medicare Other

## 2022-03-09 DIAGNOSIS — E118 Type 2 diabetes mellitus with unspecified complications: Secondary | ICD-10-CM | POA: Diagnosis not present

## 2022-03-09 DIAGNOSIS — E785 Hyperlipidemia, unspecified: Secondary | ICD-10-CM | POA: Diagnosis not present

## 2022-03-09 DIAGNOSIS — Z85118 Personal history of other malignant neoplasm of bronchus and lung: Secondary | ICD-10-CM | POA: Diagnosis not present

## 2022-03-09 DIAGNOSIS — I5042 Chronic combined systolic (congestive) and diastolic (congestive) heart failure: Secondary | ICD-10-CM | POA: Diagnosis not present

## 2022-03-09 DIAGNOSIS — E46 Unspecified protein-calorie malnutrition: Secondary | ICD-10-CM | POA: Diagnosis not present

## 2022-03-09 DIAGNOSIS — I429 Cardiomyopathy, unspecified: Secondary | ICD-10-CM | POA: Diagnosis not present

## 2022-03-09 DIAGNOSIS — M6281 Muscle weakness (generalized): Secondary | ICD-10-CM | POA: Diagnosis not present

## 2022-03-09 DIAGNOSIS — I69354 Hemiplegia and hemiparesis following cerebral infarction affecting left non-dominant side: Secondary | ICD-10-CM | POA: Diagnosis not present

## 2022-03-09 NOTE — Telephone Encounter (Deleted)
Transition Care Management Unsuccessful Follow-up Telephone Call  Date of discharge and from where:  03/04/22 [facility] Inpatient  Attempts:  1st Attempt  Reason for unsuccessful TCM follow-up call:  Left voice message   Transition Care Management Unsuccessful Follow-up Telephone Call  Date of discharge and from where:  03/04/22 [facility] Inpatient  Attempts:  2nd Attempt  Reason for unsuccessful TCM follow-up call:  Unable to reach patient     Jared Stevens' daughter informed me that he is in [facility] [employer]. Pt was not discharged home.

## 2022-03-10 ENCOUNTER — Other Ambulatory Visit: Payer: Self-pay

## 2022-03-10 ENCOUNTER — Ambulatory Visit
Admission: RE | Admit: 2022-03-10 | Discharge: 2022-03-10 | Disposition: A | Discharge status: Home / Self Care | Payer: Medicare Other | Source: Ambulatory Visit | Attending: Radiation Oncology | Admitting: Radiation Oncology

## 2022-03-10 DIAGNOSIS — R627 Adult failure to thrive: Secondary | ICD-10-CM | POA: Diagnosis not present

## 2022-03-10 DIAGNOSIS — I429 Cardiomyopathy, unspecified: Secondary | ICD-10-CM | POA: Diagnosis not present

## 2022-03-10 DIAGNOSIS — Z510 Encounter for antineoplastic radiation therapy: Secondary | ICD-10-CM | POA: Diagnosis not present

## 2022-03-10 DIAGNOSIS — N179 Acute kidney failure, unspecified: Secondary | ICD-10-CM | POA: Diagnosis not present

## 2022-03-10 DIAGNOSIS — C3411 Malignant neoplasm of upper lobe, right bronchus or lung: Secondary | ICD-10-CM | POA: Diagnosis not present

## 2022-03-10 DIAGNOSIS — I69354 Hemiplegia and hemiparesis following cerebral infarction affecting left non-dominant side: Secondary | ICD-10-CM | POA: Diagnosis not present

## 2022-03-10 DIAGNOSIS — Z87891 Personal history of nicotine dependence: Secondary | ICD-10-CM | POA: Diagnosis not present

## 2022-03-10 LAB — RAD ONC ARIA SESSION SUMMARY
Course Elapsed Days: 22
Plan Fractions Treated to Date: 2
Plan Prescribed Dose Per Fraction: 1.8 Gy
Plan Total Fractions Prescribed: 20
Plan Total Prescribed Dose: 36 Gy
Reference Point Dosage Given to Date: 27 Gy
Reference Point Session Dosage Given: 1.8 Gy
Session Number: 15

## 2022-03-13 ENCOUNTER — Other Ambulatory Visit: Payer: Self-pay

## 2022-03-13 ENCOUNTER — Ambulatory Visit
Admission: RE | Admit: 2022-03-13 | Discharge: 2022-03-13 | Disposition: A | Discharge status: Home / Self Care | Payer: Medicare Other | Source: Ambulatory Visit | Attending: Radiation Oncology | Admitting: Radiation Oncology

## 2022-03-13 ENCOUNTER — Ambulatory Visit: Payer: Self-pay

## 2022-03-13 DIAGNOSIS — G9340 Encephalopathy, unspecified: Secondary | ICD-10-CM | POA: Diagnosis not present

## 2022-03-13 DIAGNOSIS — I428 Other cardiomyopathies: Secondary | ICD-10-CM | POA: Diagnosis not present

## 2022-03-13 DIAGNOSIS — E7800 Pure hypercholesterolemia, unspecified: Secondary | ICD-10-CM | POA: Diagnosis not present

## 2022-03-13 DIAGNOSIS — Z510 Encounter for antineoplastic radiation therapy: Secondary | ICD-10-CM | POA: Diagnosis not present

## 2022-03-13 DIAGNOSIS — R0789 Other chest pain: Secondary | ICD-10-CM | POA: Diagnosis not present

## 2022-03-13 DIAGNOSIS — N390 Urinary tract infection, site not specified: Secondary | ICD-10-CM | POA: Diagnosis not present

## 2022-03-13 DIAGNOSIS — E875 Hyperkalemia: Secondary | ICD-10-CM | POA: Diagnosis not present

## 2022-03-13 DIAGNOSIS — I69354 Hemiplegia and hemiparesis following cerebral infarction affecting left non-dominant side: Secondary | ICD-10-CM | POA: Diagnosis not present

## 2022-03-13 DIAGNOSIS — G9341 Metabolic encephalopathy: Secondary | ICD-10-CM | POA: Diagnosis not present

## 2022-03-13 DIAGNOSIS — N1330 Unspecified hydronephrosis: Secondary | ICD-10-CM | POA: Diagnosis not present

## 2022-03-13 DIAGNOSIS — R338 Other retention of urine: Secondary | ICD-10-CM | POA: Diagnosis not present

## 2022-03-13 DIAGNOSIS — I5042 Chronic combined systolic (congestive) and diastolic (congestive) heart failure: Secondary | ICD-10-CM | POA: Diagnosis not present

## 2022-03-13 DIAGNOSIS — A4159 Other Gram-negative sepsis: Secondary | ICD-10-CM | POA: Diagnosis not present

## 2022-03-13 DIAGNOSIS — E1122 Type 2 diabetes mellitus with diabetic chronic kidney disease: Secondary | ICD-10-CM | POA: Diagnosis not present

## 2022-03-13 DIAGNOSIS — Z681 Body mass index (BMI) 19 or less, adult: Secondary | ICD-10-CM | POA: Diagnosis not present

## 2022-03-13 DIAGNOSIS — R64 Cachexia: Secondary | ICD-10-CM | POA: Diagnosis not present

## 2022-03-13 DIAGNOSIS — E118 Type 2 diabetes mellitus with unspecified complications: Secondary | ICD-10-CM | POA: Diagnosis not present

## 2022-03-13 DIAGNOSIS — Z66 Do not resuscitate: Secondary | ICD-10-CM | POA: Diagnosis not present

## 2022-03-13 DIAGNOSIS — E785 Hyperlipidemia, unspecified: Secondary | ICD-10-CM | POA: Diagnosis not present

## 2022-03-13 DIAGNOSIS — I429 Cardiomyopathy, unspecified: Secondary | ICD-10-CM | POA: Diagnosis not present

## 2022-03-13 DIAGNOSIS — C3491 Malignant neoplasm of unspecified part of right bronchus or lung: Secondary | ICD-10-CM | POA: Diagnosis not present

## 2022-03-13 DIAGNOSIS — R627 Adult failure to thrive: Secondary | ICD-10-CM | POA: Diagnosis not present

## 2022-03-13 DIAGNOSIS — E872 Acidosis, unspecified: Secondary | ICD-10-CM | POA: Diagnosis not present

## 2022-03-13 DIAGNOSIS — Z85118 Personal history of other malignant neoplasm of bronchus and lung: Secondary | ICD-10-CM | POA: Diagnosis not present

## 2022-03-13 DIAGNOSIS — N138 Other obstructive and reflux uropathy: Secondary | ICD-10-CM | POA: Diagnosis not present

## 2022-03-13 DIAGNOSIS — R6520 Severe sepsis without septic shock: Secondary | ICD-10-CM | POA: Diagnosis not present

## 2022-03-13 DIAGNOSIS — D649 Anemia, unspecified: Secondary | ICD-10-CM | POA: Diagnosis not present

## 2022-03-13 DIAGNOSIS — E860 Dehydration: Secondary | ICD-10-CM | POA: Diagnosis not present

## 2022-03-13 DIAGNOSIS — M6281 Muscle weakness (generalized): Secondary | ICD-10-CM | POA: Diagnosis not present

## 2022-03-13 DIAGNOSIS — R531 Weakness: Secondary | ICD-10-CM | POA: Diagnosis not present

## 2022-03-13 DIAGNOSIS — I130 Hypertensive heart and chronic kidney disease with heart failure and stage 1 through stage 4 chronic kidney disease, or unspecified chronic kidney disease: Secondary | ICD-10-CM | POA: Diagnosis not present

## 2022-03-13 DIAGNOSIS — C3411 Malignant neoplasm of upper lobe, right bronchus or lung: Secondary | ICD-10-CM | POA: Diagnosis not present

## 2022-03-13 DIAGNOSIS — E43 Unspecified severe protein-calorie malnutrition: Secondary | ICD-10-CM | POA: Diagnosis not present

## 2022-03-13 DIAGNOSIS — J449 Chronic obstructive pulmonary disease, unspecified: Secondary | ICD-10-CM | POA: Diagnosis not present

## 2022-03-13 DIAGNOSIS — Z515 Encounter for palliative care: Secondary | ICD-10-CM | POA: Diagnosis not present

## 2022-03-13 DIAGNOSIS — I5040 Unspecified combined systolic (congestive) and diastolic (congestive) heart failure: Secondary | ICD-10-CM | POA: Diagnosis not present

## 2022-03-13 DIAGNOSIS — W19XXXA Unspecified fall, initial encounter: Secondary | ICD-10-CM | POA: Diagnosis not present

## 2022-03-13 DIAGNOSIS — N1831 Chronic kidney disease, stage 3a: Secondary | ICD-10-CM | POA: Diagnosis not present

## 2022-03-13 DIAGNOSIS — E46 Unspecified protein-calorie malnutrition: Secondary | ICD-10-CM | POA: Diagnosis not present

## 2022-03-13 DIAGNOSIS — N179 Acute kidney failure, unspecified: Secondary | ICD-10-CM | POA: Diagnosis not present

## 2022-03-13 DIAGNOSIS — Z87891 Personal history of nicotine dependence: Secondary | ICD-10-CM | POA: Diagnosis not present

## 2022-03-13 DIAGNOSIS — R079 Chest pain, unspecified: Secondary | ICD-10-CM | POA: Diagnosis not present

## 2022-03-13 DIAGNOSIS — D631 Anemia in chronic kidney disease: Secondary | ICD-10-CM | POA: Diagnosis not present

## 2022-03-13 LAB — RAD ONC ARIA SESSION SUMMARY
Course Elapsed Days: 25
Plan Fractions Treated to Date: 3
Plan Prescribed Dose Per Fraction: 1.8 Gy
Plan Total Fractions Prescribed: 20
Plan Total Prescribed Dose: 36 Gy
Reference Point Dosage Given to Date: 28.8 Gy
Reference Point Session Dosage Given: 1.8 Gy
Session Number: 16

## 2022-03-13 NOTE — Patient Outreach (Signed)
  Care Coordination   03/13/2022 Name: Jared Stevens MRN: [id number] DOB: Jul 26, 1950   Care Coordination Outreach Attempts:  An unsuccessful telephone outreach was attempted for a scheduled appointment today. Per chart review of nutritionist visit on 03/06/22 patient is in [facility] skilled nursing facility.   Follow Up Plan:  Additional outreach attempts will be made to offer the patient care coordination information and services.   Encounter Outcome:  No Answer  Care Coordination Interventions Activated:  No   Care Coordination Interventions:  No, not indicated    [name] RN,BSN,CCM [place] [phone] direct line

## 2022-03-14 ENCOUNTER — Other Ambulatory Visit: Payer: Self-pay

## 2022-03-14 ENCOUNTER — Ambulatory Visit
Admission: RE | Admit: 2022-03-14 | Discharge: 2022-03-14 | Disposition: A | Discharge status: Home / Self Care | Payer: Medicare Other | Source: Ambulatory Visit | Attending: Radiation Oncology | Admitting: Radiation Oncology

## 2022-03-14 DIAGNOSIS — Z510 Encounter for antineoplastic radiation therapy: Secondary | ICD-10-CM | POA: Diagnosis not present

## 2022-03-14 DIAGNOSIS — C3411 Malignant neoplasm of upper lobe, right bronchus or lung: Secondary | ICD-10-CM | POA: Diagnosis not present

## 2022-03-14 DIAGNOSIS — Z87891 Personal history of nicotine dependence: Secondary | ICD-10-CM | POA: Diagnosis not present

## 2022-03-14 LAB — RAD ONC ARIA SESSION SUMMARY
Course Elapsed Days: 26
Plan Fractions Treated to Date: 4
Plan Prescribed Dose Per Fraction: 1.8 Gy
Plan Total Fractions Prescribed: 20
Plan Total Prescribed Dose: 36 Gy
Reference Point Dosage Given to Date: 30.6 Gy
Reference Point Session Dosage Given: 1.8 Gy
Session Number: 17

## 2022-03-15 ENCOUNTER — Inpatient Hospital Stay (HOSPITAL_COMMUNITY)
Admission: EM | Admit: 2022-03-15 | Discharge: 2022-03-21 | DRG: 871 | Disposition: A | Discharge status: Hospice | Payer: Medicare Other | Source: Ambulatory Visit | Attending: Internal Medicine | Admitting: Internal Medicine

## 2022-03-15 ENCOUNTER — Inpatient Hospital Stay: Payer: Medicare Other | Admitting: Nurse Practitioner

## 2022-03-15 ENCOUNTER — Telehealth: Payer: Self-pay | Admitting: Nurse Practitioner

## 2022-03-15 ENCOUNTER — Emergency Department (HOSPITAL_COMMUNITY): Payer: Medicare Other

## 2022-03-15 ENCOUNTER — Other Ambulatory Visit: Payer: Self-pay

## 2022-03-15 ENCOUNTER — Ambulatory Visit
Admission: RE | Admit: 2022-03-15 | Discharge: 2022-03-15 | Disposition: A | Discharge status: Home / Self Care | Payer: Medicare Other | Source: Ambulatory Visit | Attending: Radiation Oncology | Admitting: Radiation Oncology

## 2022-03-15 DIAGNOSIS — E43 Unspecified severe protein-calorie malnutrition: Secondary | ICD-10-CM | POA: Diagnosis not present

## 2022-03-15 DIAGNOSIS — I428 Other cardiomyopathies: Secondary | ICD-10-CM | POA: Diagnosis present

## 2022-03-15 DIAGNOSIS — N390 Urinary tract infection, site not specified: Secondary | ICD-10-CM | POA: Diagnosis present

## 2022-03-15 DIAGNOSIS — Z515 Encounter for palliative care: Secondary | ICD-10-CM

## 2022-03-15 DIAGNOSIS — I2510 Atherosclerotic heart disease of native coronary artery without angina pectoris: Secondary | ICD-10-CM | POA: Diagnosis present

## 2022-03-15 DIAGNOSIS — N179 Acute kidney failure, unspecified: Secondary | ICD-10-CM | POA: Diagnosis present

## 2022-03-15 DIAGNOSIS — A4159 Other Gram-negative sepsis: Secondary | ICD-10-CM | POA: Diagnosis present

## 2022-03-15 DIAGNOSIS — R627 Adult failure to thrive: Secondary | ICD-10-CM | POA: Diagnosis not present

## 2022-03-15 DIAGNOSIS — Z66 Do not resuscitate: Secondary | ICD-10-CM | POA: Diagnosis present

## 2022-03-15 DIAGNOSIS — N1330 Unspecified hydronephrosis: Secondary | ICD-10-CM | POA: Diagnosis not present

## 2022-03-15 DIAGNOSIS — R54 Age-related physical debility: Secondary | ICD-10-CM | POA: Diagnosis present

## 2022-03-15 DIAGNOSIS — C3491 Malignant neoplasm of unspecified part of right bronchus or lung: Secondary | ICD-10-CM | POA: Diagnosis present

## 2022-03-15 DIAGNOSIS — E1122 Type 2 diabetes mellitus with diabetic chronic kidney disease: Secondary | ICD-10-CM | POA: Diagnosis present

## 2022-03-15 DIAGNOSIS — E875 Hyperkalemia: Secondary | ICD-10-CM | POA: Diagnosis present

## 2022-03-15 DIAGNOSIS — R531 Weakness: Secondary | ICD-10-CM | POA: Diagnosis not present

## 2022-03-15 DIAGNOSIS — I5042 Chronic combined systolic (congestive) and diastolic (congestive) heart failure: Secondary | ICD-10-CM | POA: Diagnosis present

## 2022-03-15 DIAGNOSIS — R0789 Other chest pain: Secondary | ICD-10-CM | POA: Diagnosis not present

## 2022-03-15 DIAGNOSIS — E7800 Pure hypercholesterolemia, unspecified: Secondary | ICD-10-CM | POA: Diagnosis present

## 2022-03-15 DIAGNOSIS — E860 Dehydration: Secondary | ICD-10-CM | POA: Diagnosis present

## 2022-03-15 DIAGNOSIS — Z9221 Personal history of antineoplastic chemotherapy: Secondary | ICD-10-CM

## 2022-03-15 DIAGNOSIS — Z87891 Personal history of nicotine dependence: Secondary | ICD-10-CM

## 2022-03-15 DIAGNOSIS — C3490 Malignant neoplasm of unspecified part of unspecified bronchus or lung: Secondary | ICD-10-CM | POA: Diagnosis not present

## 2022-03-15 DIAGNOSIS — N1831 Chronic kidney disease, stage 3a: Secondary | ICD-10-CM | POA: Diagnosis not present

## 2022-03-15 DIAGNOSIS — Z79899 Other long term (current) drug therapy: Secondary | ICD-10-CM

## 2022-03-15 DIAGNOSIS — Z923 Personal history of irradiation: Secondary | ICD-10-CM

## 2022-03-15 DIAGNOSIS — D631 Anemia in chronic kidney disease: Secondary | ICD-10-CM | POA: Diagnosis present

## 2022-03-15 DIAGNOSIS — Z510 Encounter for antineoplastic radiation therapy: Secondary | ICD-10-CM | POA: Diagnosis not present

## 2022-03-15 DIAGNOSIS — E872 Acidosis, unspecified: Secondary | ICD-10-CM | POA: Diagnosis present

## 2022-03-15 DIAGNOSIS — Z8673 Personal history of transient ischemic attack (TIA), and cerebral infarction without residual deficits: Secondary | ICD-10-CM

## 2022-03-15 DIAGNOSIS — I255 Ischemic cardiomyopathy: Secondary | ICD-10-CM | POA: Diagnosis present

## 2022-03-15 DIAGNOSIS — I130 Hypertensive heart and chronic kidney disease with heart failure and stage 1 through stage 4 chronic kidney disease, or unspecified chronic kidney disease: Secondary | ICD-10-CM | POA: Diagnosis not present

## 2022-03-15 DIAGNOSIS — D649 Anemia, unspecified: Secondary | ICD-10-CM | POA: Diagnosis not present

## 2022-03-15 DIAGNOSIS — N401 Enlarged prostate with lower urinary tract symptoms: Secondary | ICD-10-CM | POA: Diagnosis present

## 2022-03-15 DIAGNOSIS — G9341 Metabolic encephalopathy: Secondary | ICD-10-CM | POA: Diagnosis present

## 2022-03-15 DIAGNOSIS — N138 Other obstructive and reflux uropathy: Secondary | ICD-10-CM | POA: Diagnosis present

## 2022-03-15 DIAGNOSIS — R079 Chest pain, unspecified: Secondary | ICD-10-CM | POA: Diagnosis not present

## 2022-03-15 DIAGNOSIS — R64 Cachexia: Secondary | ICD-10-CM | POA: Diagnosis present

## 2022-03-15 DIAGNOSIS — R6520 Severe sepsis without septic shock: Secondary | ICD-10-CM | POA: Diagnosis present

## 2022-03-15 DIAGNOSIS — R338 Other retention of urine: Secondary | ICD-10-CM | POA: Diagnosis not present

## 2022-03-15 DIAGNOSIS — Z681 Body mass index (BMI) 19 or less, adult: Secondary | ICD-10-CM | POA: Diagnosis not present

## 2022-03-15 DIAGNOSIS — Z7189 Other specified counseling: Secondary | ICD-10-CM | POA: Diagnosis not present

## 2022-03-15 DIAGNOSIS — G9340 Encephalopathy, unspecified: Secondary | ICD-10-CM | POA: Diagnosis not present

## 2022-03-15 DIAGNOSIS — Z7951 Long term (current) use of inhaled steroids: Secondary | ICD-10-CM

## 2022-03-15 DIAGNOSIS — R7989 Other specified abnormal findings of blood chemistry: Secondary | ICD-10-CM

## 2022-03-15 DIAGNOSIS — Z7901 Long term (current) use of anticoagulants: Secondary | ICD-10-CM

## 2022-03-15 DIAGNOSIS — I5040 Unspecified combined systolic (congestive) and diastolic (congestive) heart failure: Secondary | ICD-10-CM | POA: Diagnosis not present

## 2022-03-15 DIAGNOSIS — C3411 Malignant neoplasm of upper lobe, right bronchus or lung: Secondary | ICD-10-CM | POA: Diagnosis not present

## 2022-03-15 DIAGNOSIS — J449 Chronic obstructive pulmonary disease, unspecified: Secondary | ICD-10-CM | POA: Diagnosis present

## 2022-03-15 LAB — CBC WITH DIFFERENTIAL/PLATELET
Abs Immature Granulocytes: 0.31 10*3/uL — ABNORMAL HIGH (ref 0.00–0.07)
Basophils Absolute: 0.1 10*3/uL (ref 0.0–0.1)
Basophils Relative: 0 %
Eosinophils Absolute: 0.1 10*3/uL (ref 0.0–0.5)
Eosinophils Relative: 0 %
HCT: 33.9 % — ABNORMAL LOW (ref 39.0–52.0)
Hemoglobin: 11.1 g/dL — ABNORMAL LOW (ref 13.0–17.0)
Immature Granulocytes: 2 %
Lymphocytes Relative: 1 %
Lymphs Abs: 0.2 10*3/uL — ABNORMAL LOW (ref 0.7–4.0)
MCH: 26.9 pg (ref 26.0–34.0)
MCHC: 32.7 g/dL (ref 30.0–36.0)
MCV: 82.1 fL (ref 80.0–100.0)
Monocytes Absolute: 0.8 10*3/uL (ref 0.1–1.0)
Monocytes Relative: 4 %
Neutro Abs: 16.3 10*3/uL — ABNORMAL HIGH (ref 1.7–7.7)
Neutrophils Relative %: 93 %
Platelets: 545 10*3/uL — ABNORMAL HIGH (ref 150–400)
RBC: 4.13 MIL/uL — ABNORMAL LOW (ref 4.22–5.81)
RDW: 17.7 % — ABNORMAL HIGH (ref 11.5–15.5)
WBC: 17.6 10*3/uL — ABNORMAL HIGH (ref 4.0–10.5)
nRBC: 0.1 % (ref 0.0–0.2)

## 2022-03-15 LAB — RAD ONC ARIA SESSION SUMMARY
Course Elapsed Days: 27
Plan Fractions Treated to Date: 5
Plan Prescribed Dose Per Fraction: 1.8 Gy
Plan Total Fractions Prescribed: 20
Plan Total Prescribed Dose: 36 Gy
Reference Point Dosage Given to Date: 32.4 Gy
Reference Point Session Dosage Given: 1.8 Gy
Session Number: 18

## 2022-03-15 LAB — URINALYSIS, ROUTINE W REFLEX MICROSCOPIC
Bilirubin Urine: NEGATIVE
Glucose, UA: NEGATIVE mg/dL
Ketones, ur: NEGATIVE mg/dL
Nitrite: NEGATIVE
Protein, ur: NEGATIVE mg/dL
Specific Gravity, Urine: 1.011 (ref 1.005–1.030)
WBC, UA: >50 WBC/hpf — ABNORMAL HIGH (ref 0–5)
pH: 5 (ref 5.0–8.0)

## 2022-03-15 LAB — TROPONIN I (HIGH SENSITIVITY)
Troponin I (High Sensitivity): 133 ng/L (ref <18)
Troponin I (High Sensitivity): 34 ng/L — ABNORMAL HIGH (ref <18)

## 2022-03-15 LAB — COMPREHENSIVE METABOLIC PANEL
ALT: 52 U/L — ABNORMAL HIGH (ref 0–44)
AST: 68 U/L — ABNORMAL HIGH (ref 15–41)
Albumin: 2.2 g/dL — ABNORMAL LOW (ref 3.5–5.0)
Alkaline Phosphatase: 145 U/L — ABNORMAL HIGH (ref 38–126)
Anion gap: 24 — ABNORMAL HIGH (ref 5–15)
BUN: 214 mg/dL — ABNORMAL HIGH (ref 8–23)
CO2: 16 mmol/L — ABNORMAL LOW (ref 22–32)
Calcium: 8.7 mg/dL — ABNORMAL LOW (ref 8.9–10.3)
Chloride: 96 mmol/L — ABNORMAL LOW (ref 98–111)
Creatinine, Ser: 9.65 mg/dL — ABNORMAL HIGH (ref 0.61–1.24)
GFR, Estimated: 5 mL/min — ABNORMAL LOW (ref >60)
Glucose, Bld: 93 mg/dL (ref 70–99)
Potassium: 5.9 mmol/L — ABNORMAL HIGH (ref 3.5–5.1)
Sodium: 136 mmol/L (ref 135–145)
Total Bilirubin: 0.6 mg/dL (ref 0.3–1.2)
Total Protein: 8.4 g/dL — ABNORMAL HIGH (ref 6.5–8.1)

## 2022-03-15 LAB — BRAIN NATRIURETIC PEPTIDE: B Natriuretic Peptide: 314.3 pg/mL — ABNORMAL HIGH (ref 0.0–100.0)

## 2022-03-15 LAB — LIPASE, BLOOD: Lipase: 38 U/L (ref 11–51)

## 2022-03-15 LAB — PROTIME-INR
INR: 2.5 — ABNORMAL HIGH (ref 0.8–1.2)
Prothrombin Time: 26.9 seconds — ABNORMAL HIGH (ref 11.4–15.2)

## 2022-03-15 LAB — CBG MONITORING, ED: Glucose-Capillary: 93 mg/dL (ref 70–99)

## 2022-03-15 MED ORDER — CALCIUM GLUCONATE-NACL 1-0.675 GM/50ML-% IV SOLN
1.0000 g | Freq: Once | INTRAVENOUS | Status: AC
  Administered 2022-03-15: 1000 mg via INTRAVENOUS
  Filled 2022-03-15: qty 50

## 2022-03-15 MED ORDER — SODIUM BICARBONATE 8.4 % IV SOLN
INTRAVENOUS | Status: AC
  Filled 2022-03-15: qty 150
  Filled 2022-03-15 (×2): qty 1000

## 2022-03-15 MED ORDER — SODIUM CHLORIDE 0.9 % IV SOLN
2.0000 g | INTRAVENOUS | Status: DC
  Administered 2022-03-16: 2 g via INTRAVENOUS
  Filled 2022-03-15: qty 20

## 2022-03-15 MED ORDER — ALBUTEROL SULFATE (2.5 MG/3ML) 0.083% IN NEBU
10.0000 mg | INHALATION_SOLUTION | Freq: Once | RESPIRATORY_TRACT | Status: AC
  Administered 2022-03-15: 10 mg via RESPIRATORY_TRACT
  Filled 2022-03-15: qty 12

## 2022-03-15 MED ORDER — DEXTROSE 50 % IV SOLN
1.0000 | Freq: Once | INTRAVENOUS | Status: AC
  Administered 2022-03-15: 50 mL via INTRAVENOUS
  Filled 2022-03-15: qty 50

## 2022-03-15 MED ORDER — INSULIN ASPART 100 UNIT/ML IV SOLN
5.0000 [IU] | Freq: Once | INTRAVENOUS | Status: AC
  Administered 2022-03-15: 5 [IU] via INTRAVENOUS
  Filled 2022-03-15: qty 0.05

## 2022-03-15 MED ORDER — SODIUM BICARBONATE 8.4 % IV SOLN
50.0000 meq | Freq: Once | INTRAVENOUS | Status: AC
  Administered 2022-03-15: 50 meq via INTRAVENOUS
  Filled 2022-03-15: qty 50

## 2022-03-15 MED ORDER — SODIUM CHLORIDE 0.9 % IV BOLUS
500.0000 mL | Freq: Once | INTRAVENOUS | Status: AC
  Administered 2022-03-15: 500 mL via INTRAVENOUS

## 2022-03-15 MED ORDER — SODIUM CHLORIDE 0.9 % IV SOLN
1.0000 g | Freq: Once | INTRAVENOUS | Status: AC
  Administered 2022-03-15: 1 g via INTRAVENOUS
  Filled 2022-03-15: qty 10

## 2022-03-15 MED ORDER — FUROSEMIDE 10 MG/ML IJ SOLN
40.0000 mg | Freq: Once | INTRAMUSCULAR | Status: AC
  Administered 2022-03-15: 40 mg via INTRAVENOUS
  Filled 2022-03-15: qty 4

## 2022-03-15 NOTE — ED Provider Notes (Signed)
[place] DEPT Provider Note   CSN: [id number] Arrival date & time: 03/15/22  1354     History  Chief Complaint  Patient presents with   Chest Pain   Weakness    Jared Stevens is a 71 y.o. male.  HPI Patient presents from our affiliated cancer center with concern for fatigue, possible chest pain, possible dyspnea.  Initially the patient is not able to provide his own details to me, but in very brief responses he currently denies chest pain, acknowledges abdominal pain.  Much of the history is obtained on chart review.  Patient's history is notable for recurrence of lung cancer with going radiation and chemotherapy.  Patient is also on anticoagulation.  Per chart review the patient was scheduled for radiation therapy today.  He was sent from there here due to grossly appreciable weakness, as well as concerns of pain.  Patient was discharged from this facility last week after admission for failure to thrive, acute kidney injury, heart failure exacerbation.  Level 5 secondary to acuity of condition given the patient's protuberant abdomen, description of abdominal pain, concern for acute urinary retention.    Home Medications Prior to Admission medications   Medication Sig Start Date End Date Taking? Authorizing Provider  albuterol (VENTOLIN HFA) 108 (90 Base) MCG/ACT inhaler Inhale 1-2 puffs into the lungs every 6 (six) hours as needed for wheezing or shortness of breath. Patient states he takes only as needed. 05/13/21   [name], [name], NP  apixaban (ELIQUIS) 5 MG TABS tablet Take 1 tablet (5 mg total) by mouth 2 (two) times daily. 03/04/22 04/03/22  Donne Hazel, MD  budesonide-formoterol (SYMBICORT) 160-4.5 MCG/ACT inhaler INHALE TWO PUFFS INTO THE LUNGS TWICE DAILY (IN THE MORNING AND IN THE EVENING) Patient taking differently: Inhale 2 puffs into the lungs in the morning and at bedtime. 02/20/22   [name], [name], NP  docusate sodium  (COLACE) 100 MG capsule Take 1 capsule (100 mg total) by mouth daily. 03/04/22 04/03/22  Donne Hazel, MD  finasteride (PROSCAR) 5 MG tablet Take 5 mg by mouth daily. 12/07/20   [provider]  latanoprost (XALATAN) 0.005 % ophthalmic solution Place 1 drop into both eyes daily. 03/12/18   [provider]  ondansetron (ZOFRAN-ODT) 8 MG disintegrating tablet 8mg  ODT q4 hours prn nausea 02/27/22   Veryl Speak, MD  oxyCODONE (OXY IR/ROXICODONE) 5 MG immediate release tablet Take 1 tablet (5 mg total) by mouth every 4 (four) hours as needed for moderate pain, severe pain or breakthrough pain. 03/04/22   Donne Hazel, MD  polyethylene glycol (MIRALAX) 17 g packet Take 17 g by mouth daily. 03/04/22   Donne Hazel, MD  rosuvastatin (CRESTOR) 40 MG tablet TAKE ONE TABLET BY MOUTH EVERY DAY 07/08/21   Nahser, Wonda Cheng, MD  tamsulosin (FLOMAX) 0.4 MG CAPS capsule Take 0.4 mg by mouth at bedtime. 11/05/20   [provider]      Allergies    Patient has no known allergies.    Review of Systems   Review of Systems  Unable to perform ROS: Acuity of condition    Physical Exam Updated Vital Signs BP 124/77 (BP Location: Left Arm)   Pulse 86   Temp (!) 96.6 F (35.9 C) (Oral)   Resp 19   SpO2 100%  Physical Exam Vitals and nursing note reviewed. Exam conducted with a chaperone present.  Constitutional:      General: He is in acute distress.  Appearance: He is diaphoretic.     Comments: Cachectic adult male nasal cannula in place whispering minimally audible responses  HENT:     Head: Normocephalic and atraumatic.  Eyes:     Conjunctiva/sclera: Conjunctivae normal.  Cardiovascular:     Rate and Rhythm: Regular rhythm. Tachycardia present.  Pulmonary:     Effort: Pulmonary effort is normal. No respiratory distress.     Breath sounds: No stridor. Decreased breath sounds present.  Abdominal:     General: There is no distension.     Comments: Protuberant abdomen,  guarding  Genitourinary:     Skin:    General: Skin is warm.  Neurological:     Comments: Patient initially listless, improved markedly after Foley catheter was placed and urine is produced, likely due to resolution of acute urinary retention.  Patient has diffuse atrophy.  Patient is symmetric, after he returns to interactivity, speech is brief, clear.     ED Results / Procedures / Treatments   Labs (all labs ordered are listed, but only abnormal results are displayed) Labs Reviewed  CBC WITH DIFFERENTIAL/PLATELET - Abnormal; Notable for the following components:      Result Value   WBC 17.6 (*)    RBC 4.13 (*)    Hemoglobin 11.1 (*)    HCT 33.9 (*)    RDW 17.7 (*)    Platelets 545 (*)    Neutro Abs 16.3 (*)    Lymphs Abs 0.2 (*)    Abs Immature Granulocytes 0.31 (*)    All other components within normal limits  BRAIN NATRIURETIC PEPTIDE - Abnormal; Notable for the following components:   B Natriuretic Peptide 314.3 (*)    All other components within normal limits  PROTIME-INR - Abnormal; Notable for the following components:   Prothrombin Time 26.9 (*)    INR 2.5 (*)    All other components within normal limits  URINALYSIS, ROUTINE W REFLEX MICROSCOPIC - Abnormal; Notable for the following components:   APPearance CLOUDY (*)    Hgb urine dipstick LARGE (*)    Leukocytes,Ua LARGE (*)    WBC, UA >50 (*)    Bacteria, UA MANY (*)    All other components within normal limits  COMPREHENSIVE METABOLIC PANEL  LACTIC ACID, PLASMA  LACTIC ACID, PLASMA  LIPASE, BLOOD  CBG MONITORING, ED  TROPONIN I (HIGH SENSITIVITY)  TROPONIN I (HIGH SENSITIVITY)       EKG # 1 EKG Interpretation  Date/Time:  Wednesday March 15 2022 14:00:27 EDT Ventricular Rate:  85 PR Interval:  142 QRS Duration: 85 QT Interval:  400 QTC Calculation: 476 R Axis:   24 Text Interpretation: Sinus arrhythmia Abnormal R-wave progression, early transition Probable inferior infarct, acute ST-t  wave abnormality with elevation in multiple leads Abnormal ECG Confirmed by Carmin Muskrat [phone]) on 03/15/2022 2:53:56 PM   EKG #2  EKG Interpretation  Date/Time:  Wednesday March 15 2022 15:58:23 EDT Ventricular Rate:  101 PR Interval:  143 QRS Duration: 104 QT Interval:  378 QTC Calculation: 490 R Axis:   34 Text Interpretation: Sinus tachycardia Abnormal R-wave progression, early transition ST-t wave abnormality less profound ST changes compared to prior Abnormal ECG Confirmed by Carmin Muskrat [phone]) on 03/15/2022 4:27:47 PM          Radiology DG Chest Port 1 View  Result Date: 03/15/2022 CLINICAL DATA:  CP EXAM: PORTABLE CHEST 1 VIEW COMPARISON:  Chest x-ray February 27, 2022. FINDINGS: Patient rotation. Unchanged masslike opacity along the right paratracheal  stripe. Mildly improved aeration of the right upper lobe. Similar right-sided volume loss. No visible pneumothorax. Left lung is clear. IMPRESSION: Unchanged masslike opacity along the right paratracheal stripe. Mildly improved aeration of the right upper lobe. Electronically Signed   By: Margaretha Sheffield M.D.   On: 03/15/2022 15:24    Procedures Procedures    Medications Ordered in ED Medications  sodium chloride 0.9 % bolus 500 mL (has no administration in time range)  cefTRIAXone (ROCEPHIN) 1 g in sodium chloride 0.9 % 100 mL IVPB (has no administration in time range)    ED Course/ Medical Decision Making/ A&P This patient with a Hx of possible medical issues including heart failure, recent admission for acute kidney injury, and ongoing therapy for non-small cell lung cancer presents to the ED for concern of weakness chest pain, abdominal pain this involves an extensive number of treatment options, and is a complaint that carries with it a high risk of complications and morbidity.    The differential diagnosis includes progression of disease, infection, bacteremia, sepsis, pneumonia   Social Determinants  of Health:  Age, ongoing cancer therapy  Additional history obtained:  Additional history and/or information obtained from cancer center notes, notable for ongoing efforts for palliative care, recommendation previously for palliative and discharge summary from last week reviewed, notable for admission for acute kidney injury   After the initial evaluation, orders, including: Monitoring fluids labs Foley catheter due to concern for initial physical exam concerning for acute urinary retention with bedside bladder scan with greater than 1 L urine in his bladder.  Were initiated.   Patient placed on Cardiac and Pulse-Oximetry Monitors.4:29 PM  The patient was maintained on a cardiac monitor.  The cardiac monitored showed an rhythm of 90 sinus normal The patient was also maintained on pulse oximetry. The readings were typically 99% 2 L nasal cannula abnormal, new baseline   On repeat evaluation of the patient improved Patient now awake and alert, speaking, stating that he has no pain, does feel weak. Lab Tests:  I personally interpreted labs.  The pertinent results include: Evidence for urinary tract infection, therapeutic INR level  Imaging Studies ordered:  I independently visualized and interpreted imaging which showed persistent demonstration of right-sided mass opacification in the right hemithorax I agree with the radiologist interpretation  Consultations Obtained:  I requested consultation with the cardiology,  and discussed lab and imaging findings as well as pertinent plan - they recommend: Patient can stay at this facility.  Absent ongoing chest pain though he had new EKG changes compared to those of a week ago, some suspicion for pericarditis, patient not designated as a code STEMI.  Dispostion / Final MDM:  After consideration of the diagnostic results and the patient's response to treatment, this patient with multiple medical issues, most notably recent admission for AKI,  weakness, and with ongoing effort for cancer curative treatment with radiation therapy presents with weakness, after an episode of chest pain that occurred, resolved prior to ED arrival, but with ongoing listlessness, weakness.  Initial suspicion for acute urinary retention substantiated with demonstration of greater than 1 L in his bladder on ultrasound.  He improved markedly after placement of Foley catheter, but labs are notable for infection, leukocytosis and abnormal urinalysis.  With recent acute kidney injury, this remains a concern for recurrence.  Patient also found to be hypothermic on arrival, but not hypotensive.  Patient also w AKI, requiring meds, nephro consult (completed by Dr. Danella Maiers had placement of passive  rewarming devices soon after my initial evaluation.  Patient received fluid resuscitation, antibiotics, discussion with cardiology as above, was admitted for further monitoring, management.  Final Clinical Impression(s) / ED Diagnoses Final diagnoses:  Acute encephalopathy  Acute urinary retention  Atypical chest pain  Lower urinary tract infectious disease  AKI (acute kidney injury) ([place])  Hyperkalemia   CRITICAL CARE Performed by: Carmin Muskrat Total critical care time: 35 minutes Critical care time was exclusive of separately billable procedures and treating other patients. Critical care was necessary to treat or prevent imminent or life-threatening deterioration. Critical care was time spent personally by me on the following activities: development of treatment plan with patient and/or surrogate as well as nursing, discussions with consultants, evaluation of patient's response to treatment, examination of patient, obtaining history from patient or surrogate, ordering and performing treatments and interventions, ordering and review of laboratory studies, ordering and review of radiographic studies, pulse oximetry and re-evaluation of patient's condition.     Carmin Muskrat, MD 03/15/22 [phone]

## 2022-03-15 NOTE — Consult Note (Signed)
Jared Stevens  HISTORY AND PHYSICAL  Jared Stevens is an 71 y.o. male.    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    PT was discharged from this facility on 10/7 after being admitted for FTT.  It was thought that he was not tolerating his treatment for recurrent NSCLC well and and palliative care was consulted.  Pt was noted to have limited insight into his disease process, desiring to do "everything" but not being able to elaborate on what that was.    He was discharged to a rehab facility.  Since then it does not appear he has done well.  He is not able to give details today but is able to nod yes and no to simple questions.  He was brought here for chest pain, possible fatigue and dyspnea.   Labs revealed significant elyte derangements.  BUN 214, Cr 9.65, K 5.9, CO2 26, albumin 2.2, Trop 133, WBC ct 17.6, hgb 11.1, plts 545.  Discharge Cr a week ago was 1.17.  UA looked infected and bladder scan showed > 1 L of urine.  Foley placed, immediate return of urine.    Has been medically treated for hyperkalemia.  In this setting we are asked to see.    Pt reports being cold.  I turned on his Retail banker.    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   Tx with tPA and thrombectomy // Coumadin (managed by PCP)   Hx of recurrent R pleural effusion    s/p thoracentesis   Hypercholesteremia    Long term (current) use of anticoagulants 01/08/2020   Lung cancer ([place]) 03/2016   Mixed Ischemic and Non-Ischemic Cardiomyopathy    Non-small cell carcinoma of lung    Nonsustained ventricular tachycardia    Tobacco use disorder, continuous 09/27/2018   PSH: Past Surgical History:  Procedure Laterality Date   Arm Surgery     BRONCHIAL BIOPSY  12/27/2021   Procedure: BRONCHIAL BIOPSIES;  Surgeon: Garner Nash, DO;  Location: [place];  Service: Pulmonary;;   BRONCHIAL NEEDLE ASPIRATION BIOPSY  12/27/2021   Procedure: BRONCHIAL NEEDLE ASPIRATION BIOPSIES;  Surgeon: Garner Nash, DO;  Location: [facility];  Service: Pulmonary;;   HERNIA REPAIR     IR ANGIO INTRA EXTRACRAN SEL COM CAROTID INNOMINATE UNI L MOD SED  10/21/2018   IR ANGIO VERTEBRAL SEL SUBCLAVIAN INNOMINATE UNI R MOD SED  10/21/2018   IR CT HEAD LTD  10/21/2018   IR PERCUTANEOUS ART THROMBECTOMY/INFUSION INTRACRANIAL INC DIAG ANGIO  10/21/2018   IR THORACENTESIS ASP PLEURAL SPACE W/IMG GUIDE  09/27/2018   RADIOLOGY WITH ANESTHESIA N/A 10/21/2018   Procedure: RADIOLOGY WITH ANESTHESIA;  Surgeon: Luanne Bras, MD;  Location: [place];  Service: Radiology;  Laterality: N/A;   REPLANTATION THUMB     RIGHT/LEFT HEART CATH AND CORONARY ANGIOGRAPHY N/A 10/01/2018   Procedure: RIGHT/LEFT HEART CATH AND CORONARY ANGIOGRAPHY;  Surgeon: Troy Sine, MD;  Location: [place] [facility];  Service: Cardiovascular;  Laterality: N/A;     Past Medical History:  Diagnosis Date   Acute systolic CHF (congestive heart failure) ([place])    2D ECHO 09/27/2018 revealed left ventricular ejection fraction of 15%   CHF exacerbation ([place]) 09/27/2018   Chronic obstructive pulmonary disease    Combined systolic and diastolic  CHF    Mixed Ischemic/Non-Ischemic CM // Echo 09/2018: EF 15 // cMRI 10/2018: EF 11, inf scar // Echo 12/2018: EF 20-25, Gr 3 DD // Not a candidate for ICD due to comorbid illnesses    Coronary artery disease    cath 09/2018: RCA chronically occluded >> Med Rx   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   Tx with tPA and thrombectomy // Coumadin (managed by PCP)   Hx of recurrent R pleural effusion    s/p thoracentesis   Hypercholesteremia    Long term (current) use of anticoagulants 01/08/2020   Lung cancer ([place]) 03/2016   Mixed Ischemic and Non-Ischemic Cardiomyopathy    Non-small cell carcinoma of lung    Nonsustained ventricular tachycardia    Tobacco use disorder, continuous 09/27/2018    Medications: Eliquis 5 mg BID Symbicort Docusate Proscar 5 mg daily Zofran 8 mg q 4 hrs prn Oxycodone 5 mg q 4-6 hrs prn Miralax 17 g daily Crestor 40 mg daily Flomax 0.4 mg QHS   ALLERGIES:  No Known Allergies  FAM HX: Family History  Problem Relation Age of Onset   Aneurysm Mother    Colon polyps Neg Hx    Colon cancer Neg Hx     Social History:   reports that he quit smoking about 5 months ago. His smoking use included cigarettes. He started smoking about 54 years ago. He has a 41.25 pack-year smoking history. He has never used smokeless tobacco. He reports that he does not currently use alcohol after a past usage of about 2.0 standard drinks of alcohol per week. He reports that he does not currently use drugs.  ROS: ROS: full ROS not able to be obtained d/t encephalopathy  Blood pressure 128/81, pulse (!) 108, temperature (!) 96.6 F (35.9 C), temperature source Oral, resp. rate 17, SpO2 98 %. PHYSICAL EXAM: Physical Exam GEN: frail and cachectic HEENT sunken eyes, temporal wasting NECK flat neck veins PULM clear anteriorly CV RRR ABD soft, some suprapubic tenderness EXT no LE edema NEURO encephalopathic, no frank asterixis, able to nod yes or no SKIN dry and flaky, DTI on L heel MSK  sarcopenic   Results for orders placed or performed during the hospital encounter of 03/15/22 (from the past 48 hour(s))  CBG monitoring, ED     Status: None   Collection Time: 03/15/22  2:17 PM  Result Value Ref Range   Glucose-Capillary 93 70 - 99 mg/dL    Comment: Glucose reference range applies only to samples taken after fasting for at least 8 hours.  Comprehensive metabolic panel     Status: Abnormal   Collection Time: 03/15/22  2:30 PM  Result Value Ref Range   Sodium 136 135 - 145 mmol/L   Potassium 5.9 (H)  3.5 - 5.1 mmol/L   Chloride 96 (L) 98 - 111 mmol/L   CO2 16 (L) 22 - 32 mmol/L   Glucose, Bld 93 70 - 99 mg/dL    Comment: Glucose reference range applies only to samples taken after fasting for at least 8 hours.   BUN 214 (H) 8 - 23 mg/dL    Comment: RESULTS CONFIRMED BY MANUAL DILUTION CORRECTED ON 10/18 AT 1718: PREVIOUSLY REPORTED AS 214 RESULTS CONFIRMED BY MANUAL DILUTION CRITICAL RESULT CALLED TO, READ BACK BY AND VERIFIED WITH    Creatinine, Ser 9.65 (H) 0.61 - 1.24 mg/dL   Calcium 8.7 (L) 8.9 - 10.3 mg/dL   Total Protein 8.4 (H) 6.5 - 8.1 g/dL   Albumin 2.2 (L) 3.5 - 5.0 g/dL   AST 68 (H) 15 - 41 U/L   ALT 52 (H) 0 - 44 U/L   Alkaline Phosphatase 145 (H) 38 - 126 U/L   Total Bilirubin 0.6 0.3 - 1.2 mg/dL   GFR, Estimated 5 (L) >60 mL/min    Comment: (NOTE) Calculated using the CKD-EPI Creatinine Equation (2021)    Anion gap 24 (H) 5 - 15    Comment: Electrolytes repeated to confirm. Performed at [facility], [place] [address], [place], [place] [address]   Lipase, blood     Status: None   Collection Time: 03/15/22  2:30 PM  Result Value Ref Range   Lipase 38 11 - 51 U/L    Comment: Performed at [facility], [place] [address], [place], [place] [address]  CBC with Differential     Status: Abnormal   Collection Time: 03/15/22  2:30 PM  Result Value Ref Range   WBC 17.6 (H) 4.0 - 10.5 K/uL   RBC 4.13 (L) 4.22 - 5.81 MIL/uL    Hemoglobin 11.1 (L) 13.0 - 17.0 g/dL   HCT 33.9 (L) 39.0 - 52.0 %   MCV 82.1 80.0 - 100.0 fL   MCH 26.9 26.0 - 34.0 pg   MCHC 32.7 30.0 - 36.0 g/dL   RDW 17.7 (H) 11.5 - 15.5 %   Platelets 545 (H) 150 - 400 K/uL   nRBC 0.1 0.0 - 0.2 %   Neutrophils Relative % 93 %   Neutro Abs 16.3 (H) 1.7 - 7.7 K/uL   Lymphocytes Relative 1 %   Lymphs Abs 0.2 (L) 0.7 - 4.0 K/uL   Monocytes Relative 4 %   Monocytes Absolute 0.8 0.1 - 1.0 K/uL   Eosinophils Relative 0 %   Eosinophils Absolute 0.1 0.0 - 0.5 K/uL   Basophils Relative 0 %   Basophils Absolute 0.1 0.0 - 0.1 K/uL   Immature Granulocytes 2 %   Abs Immature Granulocytes 0.31 (H) 0.00 - 0.07 K/uL    Comment: Performed at [facility], [place] [address], [place], [place] [address]  Troponin I (High Sensitivity)     Status: Abnormal   Collection Time: 03/15/22  2:30 PM  Result Value Ref Range   Troponin I (High Sensitivity) 133 (HH) <18 ng/L    Comment: CRITICAL RESULT CALLED TO, READ BACK BY AND VERIFIED WITH DELTA CHECK NOTED MILTON, A., RN @ 7619 03/15/22 MACARAYAN, LUKE (NOTE) Elevated high sensitivity troponin I (hsTnI) values and significant  changes across serial measurements may suggest ACS but many other  chronic and acute conditions are known to elevate hsTnI results.  Refer to the Links section for chest pain algorithms and additional  guidance. Performed at [facility],  [place] [address], [place], [place] [address] CORRECTED ON 10/18 AT 1718: PREVIOUSLY REPORTED AS 34   Brain natriuretic peptide     Status: Abnormal   Collection Time: 03/15/22  2:30 PM  Result Value Ref Range   B Natriuretic Peptide 314.3 (H) 0.0 - 100.0 pg/mL    Comment: Performed at [facility], [place] [address], [place], [place] [address]  Protime-INR     Status: Abnormal   Collection Time: 03/15/22  2:30 PM  Result Value Ref Range   Prothrombin Time 26.9 (H) 11.4 - 15.2 seconds   INR 2.5 (H) 0.8  - 1.2    Comment: (NOTE) INR goal varies based on device and disease states. Performed at [facility], [place] [address] [place], [place] [address]   Urinalysis, Routine w reflex microscopic     Status: Abnormal   Collection Time: 03/15/22  2:31 PM  Result Value Ref Range   Color, Urine YELLOW YELLOW   APPearance CLOUDY (A) CLEAR   Specific Gravity, Urine 1.011 1.005 - 1.030   pH 5.0 5.0 - 8.0   Glucose, UA NEGATIVE NEGATIVE mg/dL   Hgb urine dipstick LARGE (A) NEGATIVE   Bilirubin Urine NEGATIVE NEGATIVE   Ketones, ur NEGATIVE NEGATIVE mg/dL   Protein, ur NEGATIVE NEGATIVE mg/dL   Nitrite NEGATIVE NEGATIVE   Leukocytes,Ua LARGE (A) NEGATIVE   RBC / HPF 11-20 0 - 5 RBC/hpf   WBC, UA >50 (H) 0 - 5 WBC/hpf   Bacteria, UA MANY (A) NONE SEEN   Squamous Epithelial / LPF 0-5 0 - 5   WBC Clumps PRESENT     Comment: Performed at [facility], [place] [address], [place], [place] [address]    DG Chest Port 1 View  Result Date: 03/15/2022 CLINICAL DATA:  CP EXAM: PORTABLE CHEST 1 VIEW COMPARISON:  Chest x-ray February 27, 2022. FINDINGS: Patient rotation. Unchanged masslike opacity along the right paratracheal stripe. Mildly improved aeration of the right upper lobe. Similar right-sided volume loss. No visible pneumothorax. Left lung is clear. IMPRESSION: Unchanged masslike opacity along the right paratracheal stripe. Mildly improved aeration of the right upper lobe. Electronically Signed   By: Margaretha Sheffield M.D.   On: 03/15/2022 15:24    Assessment/Plan  AKI on CKD 3a: Cr of 1.17 not normal eGFR for this man.  AKI d/t obstruction which is being relieved by Foley.  Underlying cause possibly BPH and UTI.    - Foley should remain in place  - medical management of hyperkalemia and acidosis (see below)  - he is cachectic and appears extremely frail  - he is an extremely poor dialysis candidate  - check BMP q 6 hrs for 2-3 more checks  - agree with  palliative care consult- hospice and comfort measures appear appropriate at any time  2.  Hyperkalemia:  - received medical rx in the ED  - anticipate this will improve with relief of obstruction  - starting bicarb gtt   3.  Metabolic acidosis  - start bicarb gtt @ 75 mL/ hr x 24 hrs  4.  NSCLC:  - chemotherapy on hold per Dr Worthy Flank note 03/01/22  - pallative c/s  5.  Dispo: pending  Madelon Lips 03/15/2022, 6:39 PM

## 2022-03-15 NOTE — Telephone Encounter (Signed)
Pt was a no show 10/18 for a HFU with [name]. His brother called in saying there was a radiation appt, chart also shows ED visit.

## 2022-03-15 NOTE — Progress Notes (Signed)
TRH note.  TRH has been consulted for admission.  The chart has been reviewed.  We have asked to kindly get consulted again by the emergency room provider once the first troponin measurement, lipase level and CMP results are back.  Tennis Must, MD.

## 2022-03-15 NOTE — ED Provider Notes (Signed)
5:06 PM Patient signed out to me by previous ED physician. Pt is a 71 yo male presenting from cancer center for chest pain and listlistness. Hx of non-small cell lung cancer, anticoagulated on coumadin. Therapeutic today.   Pt had urinary retention. UA demonstrates UTI.   Blood work pending.     Physical Exam  BP 108/72 (BP Location: Left Arm)   Pulse (!) 102   Temp (!) 96.6 F (35.9 C) (Oral)   Resp 15   SpO2 100%   Physical Exam  Procedures  Procedures  ED Course / MDM    Medical Decision Making Amount and/or Complexity of Data Reviewed Labs: ordered. Radiology: ordered.  Risk OTC drugs. Prescription drug management. Decision regarding hospitalization.   Creatinine 1.17 nine days ago. Currently 9.65. BUN 214. Potassium 5.9. I spoke with Dr. Madelon Lips with nephrology who agrees to see patient. Pt is likely not a dialysis candidate. Please see there note for details. Pt also has elevated troponin likely from ischemic demand. Delta trop pending. Admission was delayed secondary to laboratory problems with delta troponin.   I spoke with patient's daughter and brother who requests patient be made DNR at this time. They do not wish patient to have any chest compressions or intubation. They are okay with medical therapies.   Pt accepted by hospitalist Dr. [place] Crane for admission.     Lianne Cure, DO 09/47/09 [id number]    Whyatt Klinger, [place] P, DO 66/29/47 0128

## 2022-03-15 NOTE — Telephone Encounter (Signed)
Pt brother donald Sirek called and said that Jared Stevens missed a call from from here. I let his brother know that the pt had appt. Today and I did not see any other notes/message about the pt. The brother stated that the pt was in radiation right now

## 2022-03-15 NOTE — ED Notes (Signed)
Pt continues to be on bear hugger. Pt tolerating well. Temp 98.1. Dr. Pearline Cables states to recheck temp in 1 hour and if temp stays stable then we can d/c bear hugger

## 2022-03-15 NOTE — Telephone Encounter (Signed)
Called & left Pt VM advising him to give the office a call. Per Baldo Ash, the oncologist wanted to make [place] aware of how fatigue & weak he appeared to be at his appt yesterday 03/14/22. Was calling pt to check on status of how he feels being that he No Showed his appt for today.

## 2022-03-15 NOTE — ED Triage Notes (Signed)
Pt brought over from the cancer center for chest pain and generalized weakness. Pt being treated for lung cancer.

## 2022-03-15 NOTE — ED Notes (Signed)
Took pt off of bear hugger due to temp being 97.6. Placed several warm blankets on pt. Fed him small amount of apple sauce. He tolerated this well

## 2022-03-15 NOTE — H&P (Incomplete)
History and Physical  Edin Kon SFK:[id number] DOB: 02-14-1951 DOA: 03/15/2022  Referring physician: Dr. Pearline Cables, [place]  PCP: Flossie Buffy, NP  Outpatient Specialists: Medical oncology. Patient coming from: Home through cancer center.  Chief Complaint: Generalized weakness and chest pain.  HPI: Sevag Shearn is a 71 y.o. male with medical history significant for recurrence of right non-small cell lung cancer with ongoing chemotherapy and radiation, combined systolic and diastolic CHF, prior history of MCA CVA status post thrombectomy previously on Coumadin, now on Eliquis, type 2 diabetes, coronary disease, failure to thrive, recent admission for AKI (baseline creatinine 0.9 with GFR greater than 60) and severe hypokalemia, who presents to [facility] ED due to generalized weakness, failure to thrive, and chest pain.  In the ED, work-up revealed severe AKI with creatinine of 9.65, GFR 5 from baseline creatinine of 0.9 and GFR greater than 60 for which nephrology was consulted.  Also revealed acute urinary retention, indwelling Foley catheter was placed, elevated high-sensitivity troponin, elevated liver chemistries, high anion gap metabolic acidosis, hyperkalemia with potassium 5.9, high leukocytosis with WBC 17.6 with UA positive for pyuria.  Code sepsis was called in the ED.  The patient was started on IV fluid isotonic bicarb drip and Rocephin empirically for presumptive UTI.  Admitted by [facility], hospitalist service.  ED Course: Tmax 98.1.  BP 118/68, pulse 119, respiration rate 19, O2 saturation 100% on 2 L.  Review of Systems: Review of systems as noted in the HPI. All other systems reviewed and are negative.   Past Medical History:  Diagnosis Date   Acute systolic CHF (congestive heart failure) ([place])    2D ECHO 09/27/2018 revealed left ventricular ejection fraction of 15%   CHF exacerbation ([place]) 09/27/2018   Chronic obstructive pulmonary disease    Combined systolic and diastolic CHF     Mixed Ischemic/Non-Ischemic CM // Echo 09/2018: EF 15 // cMRI 10/2018: EF 11, inf scar // Echo 12/2018: EF 20-25, Gr 3 DD // Not a candidate for ICD due to comorbid illnesses    Coronary artery disease    cath 09/2018: RCA chronically occluded >> Med Rx   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   Tx with tPA and thrombectomy // Coumadin (managed by PCP)   Hx of recurrent R pleural effusion    s/p thoracentesis   Hypercholesteremia    Long term (current) use of anticoagulants 01/08/2020   Lung cancer ([place]) 03/2016   Mixed Ischemic and Non-Ischemic Cardiomyopathy    Non-small cell carcinoma of lung    Nonsustained ventricular tachycardia    Tobacco use disorder, continuous 09/27/2018   Past Surgical History:  Procedure Laterality Date   Arm Surgery     BRONCHIAL BIOPSY  12/27/2021   Procedure: BRONCHIAL BIOPSIES;  Surgeon: Garner Nash, DO;  Location: [facility];  Service: Pulmonary;;   BRONCHIAL NEEDLE ASPIRATION BIOPSY  12/27/2021   Procedure: BRONCHIAL NEEDLE ASPIRATION BIOPSIES;  Surgeon: Garner Nash, DO;  Location: [facility];  Service: Pulmonary;;   HERNIA REPAIR     IR ANGIO INTRA EXTRACRAN SEL COM CAROTID INNOMINATE UNI L MOD SED  10/21/2018   IR ANGIO VERTEBRAL SEL SUBCLAVIAN INNOMINATE UNI R MOD SED  10/21/2018   IR CT HEAD LTD  10/21/2018   IR PERCUTANEOUS ART THROMBECTOMY/INFUSION INTRACRANIAL INC DIAG ANGIO  10/21/2018   IR THORACENTESIS ASP PLEURAL SPACE W/IMG GUIDE  09/27/2018   RADIOLOGY WITH ANESTHESIA N/A 10/21/2018   Procedure: RADIOLOGY WITH ANESTHESIA;  Surgeon: Luanne Bras, MD;  Location: [facility]  OR;  Service: Radiology;  Laterality: N/A;   REPLANTATION THUMB     RIGHT/LEFT HEART CATH AND CORONARY ANGIOGRAPHY N/A 10/01/2018   Procedure: RIGHT/LEFT HEART CATH AND CORONARY ANGIOGRAPHY;  Surgeon: Troy Sine, MD;  Location: [place] [facility];  Service: Cardiovascular;  Laterality: N/A;    Social History:  reports that he quit smoking about 5 months ago. His smoking use included  cigarettes. He started smoking about 54 years ago. He has a 41.25 pack-year smoking history. He has never used smokeless tobacco. He reports that he does not currently use alcohol after a past usage of about 2.0 standard drinks of alcohol per week. He reports that he does not currently use drugs.   No Known Allergies  Family History  Problem Relation Age of Onset   Aneurysm Mother    Colon polyps Neg Hx    Colon cancer Neg Hx     ED  Prior to Admission medications   Medication Sig Start Date End Date Taking? Authorizing Provider  albuterol (VENTOLIN HFA) 108 (90 Base) MCG/ACT inhaler Inhale 1-2 puffs into the lungs every 6 (six) hours as needed for wheezing or shortness of breath. Patient states he takes only as needed. 05/13/21   [name], [name], NP  apixaban (ELIQUIS) 5 MG TABS tablet Take 1 tablet (5 mg total) by mouth 2 (two) times daily. 03/04/22 04/03/22  Donne Hazel, MD  budesonide-formoterol (SYMBICORT) 160-4.5 MCG/ACT inhaler INHALE TWO PUFFS INTO THE LUNGS TWICE DAILY (IN THE MORNING AND IN THE EVENING) Patient taking differently: Inhale 2 puffs into the lungs in the morning and at bedtime. 02/20/22   [name], [name], NP  docusate sodium (COLACE) 100 MG capsule Take 1 capsule (100 mg total) by mouth daily. 03/04/22 04/03/22  Donne Hazel, MD  finasteride (PROSCAR) 5 MG tablet Take 5 mg by mouth daily. 12/07/20   [provider]  latanoprost (XALATAN) 0.005 % ophthalmic solution Place 1 drop into both eyes daily. 03/12/18   [provider]  ondansetron (ZOFRAN-ODT) 8 MG disintegrating tablet 8mg  ODT q4 hours prn nausea 02/27/22   Veryl Speak, MD  oxyCODONE (OXY IR/ROXICODONE) 5 MG immediate release tablet Take 1 tablet (5 mg total) by mouth every 4 (four) hours as needed for moderate pain, severe pain or breakthrough pain. 03/04/22   Donne Hazel, MD  polyethylene glycol (MIRALAX) 17 g packet Take 17 g by mouth daily. 03/04/22   Donne Hazel, MD   rosuvastatin (CRESTOR) 40 MG tablet TAKE ONE TABLET BY MOUTH EVERY DAY 07/08/21   Nahser, Wonda Cheng, MD  tamsulosin (FLOMAX) 0.4 MG CAPS capsule Take 0.4 mg by mouth at bedtime. 11/05/20   [provider]    Physical Exam: BP 118/68   Pulse (!) 119   Temp 98.1 F (36.7 C) (Axillary)   Resp 19   SpO2 100%   General: 71 y.o. year-old male frail-appearing in no acute distress.  Alert and minimally verbal. Cardiovascular: Regular rate and rhythm with no rubs or gallops.  No thyromegaly or JVD noted.  No lower extremity edema. 2/4 pulses in all 4 extremities. Respiratory: Clear to auscultation with no wheezes or rales. Good inspiratory effort. Abdomen: Soft nontender nondistended with normal bowel sounds x4 quadrants. Muskuloskeletal: No cyanosis, clubbing or edema noted bilaterally Neuro: CN II-XII intact, strength, sensation, reflexes Skin: No ulcerative lesions noted or rashes Psychiatry: Judgement and insight appear altered. Mood is appropriate for condition and setting  Labs on Admission:  Basic Metabolic Panel: Recent Labs  Lab 03/15/22 1430  NA 136  K 5.9*  CL 96*  CO2 16*  GLUCOSE 93  BUN 214*  CREATININE 9.65*  CALCIUM 8.7*   Liver Function Tests: Recent Labs  Lab 03/15/22 1430  AST 68*  ALT 52*  ALKPHOS 145*  BILITOT 0.6  PROT 8.4*  ALBUMIN 2.2*   Recent Labs  Lab 03/15/22 1430  LIPASE 38   No results for input(s): "AMMONIA" in the last 168 hours. CBC: Recent Labs  Lab 03/15/22 1430  WBC 17.6*  NEUTROABS 16.3*  HGB 11.1*  HCT 33.9*  MCV 82.1  PLT 545*   Cardiac Enzymes: No results for input(s): "CKTOTAL", "CKMB", "CKMBINDEX", "TROPONINI" in the last 168 hours.  BNP (last 3 results) Recent Labs    03/15/22 1430  BNP 314.3*    ProBNP (last 3 results) No results for input(s): "PROBNP" in the last 8760 hours.  CBG: Recent Labs  Lab 03/15/22 1417  GLUCAP 93    Radiological Exams on Admission: US RENAL  Result  Date: 03/15/2022 CLINICAL DATA:  Acute kidney injury in a 71 year old male. EXAM: RENAL / URINARY TRACT ULTRASOUND COMPLETE COMPARISON:  PET evaluation from November 30, 2021. FINDINGS: Right Kidney: Renal measurements: 12.3 x 5.1 x 7.4 cm = volume: 244.3 mL. Increased echogenicity of the RIGHT kidney with poor corticomedullary differentiation. Small cyst for which no additional dedicated follow-up imaging is recommended measuring 2 x 1.5 x 2.2 cm in the lateral anterior interpolar RIGHT kidney. Mild RIGHT renal pelvic dilation with mild infundibular distension without caliceal distension. Left Kidney: Renal measurements: 11.4 x 6.3 x 6.2 cm = volume: 231.5 mL. Mild to moderate LEFT hydronephrosis. Small cyst in the lateral LEFT kidney. Echogenic kidney with poor corticomedullary differentiation on the LEFT as well. Bladder: Foley present in the urinary bladder which remains moderately distended. Layering echogenic debris in the bladder base. On 1 image this as a convex margin at the RIGHT posterolateral urinary bladder. No substantial bladder wall thickening. Other: None. IMPRESSION: Mild to moderate LEFT and mild RIGHT hydronephrosis of uncertain etiology. No renal calculi are visible. There are signs of medical renal disease with echogenic bilateral kidneys. Debris-filled bladder with Foley catheter remains moderately distended. This patient may benefit from flushing of the Foley catheter. Urologic assessment and follow-up renal sonogram may be helpful to exclude worsening of collecting system distension. Apparent debris in the bladder base shows convex margin perhaps related to patient position and favoring the RIGHT urinary bladder. This appearance could also be related to hematoma in the urinary bladder. Correlate with any signs of hematuria and consider follow-up imaging to exclude underlying lesion. Electronically Signed   By: Zetta Bills M.D.   On: 03/15/2022 18:48   DG Chest Port 1 View  Result Date:  03/15/2022 CLINICAL DATA:  CP EXAM: PORTABLE CHEST 1 VIEW COMPARISON:  Chest x-ray February 27, 2022. FINDINGS: Patient rotation. Unchanged masslike opacity along the right paratracheal stripe. Mildly improved aeration of the right upper lobe. Similar right-sided volume loss. No visible pneumothorax. Left lung is clear. IMPRESSION: Unchanged masslike opacity along the right paratracheal stripe. Mildly improved aeration of the right upper lobe. Electronically Signed   By: Margaretha Sheffield M.D.   On: 03/15/2022 15:24    EKG: I independently viewed the EKG done and my findings are as followed: Sinus tachycardia rate of 101.  Nonspecific ST-T changes.  QTc 490.  Assessment/Plan Present on Admission: **None**  Principal Problem:  Generalized weakness  Generalized weakness, multifactorial Secondary to dehydration, failure to thrive, electrolyte abnormalities Obtain TSH Treatment of underlying conditions Palliative care consulted to assist with establishing goals of care Poor functional status, poor prognosis.  Sepsis secondary to presumptive UTI UA positive for pyuria Presented with leukocytosis 17.6, tachycardia 120, lactic acidosis 3.0 Continue Rocephin empirically started in the ED Last urine culture positive for Klebsiella pneumoniae with sensitivity to Rocephin. Follow urine culture and blood cultures for ID and sensitivities. Monitor fever curve and WBC.  AKI, prerenal in the setting of severe dehydration. Baseline creatinine appears to be 0.9 with GFR greater than 60. Presented with creatinine of 9.65 with GFR 5. Avoid nephrotoxic agents, dehydration and hypotension. Nephrology consulted by EDP Currently on isotonic bicarb for metabolic acidosis.  High anion gap metabolic acidosis Serum bicarb 16, anion gap 24 Lactic acid 3.0 Started on isotonic bicarb with nephrology's guidance. Repeat chemistry panel in the morning.  Elevated liver chemistries, unclear if side effect of  chemotherapy. Alkaline phosphatase, AST ALT elevated.  TVergia Alcon Ruben normal. Repeat chemistry panel in the morning.  Severe protein calorie malnutrition Severe failure to thrive Severe muscle mass loss BMI 14 Liberalize diet as tolerated. Dietitian consulted  Hyperkalemia in the setting of acute renal insufficiency Serum potassium 5.9. Continue isotonic bicarb drip to continue to lower potassium level. Repeat renal function test in the morning. Appreciate nephrology's assistance.  Anemia of chronic disease Hemoglobin stable at 11.1 No overt bleeding reported. Continue to monitor H&H.  Prior history of MCA CVA status post thrombectomy previously on Coumadin, now on Eliquis Resume home Eliquis for secondary CVA prevention.  BPH with acute urinary retention Indwelling Foley catheter placed on 03/15/2022 Resume home finasteride and tamsulosin. Monitor urine output    Critical care time: 65 minutes.    DVT prophylaxis: Home Eliquis.  Code Status: DNR  Family Communication: None at bedside  Disposition Plan: Admitted to progressive care unit  Consults called: Nephrology consulted by EDP  Admission status: Inpatient status.   Status is: Inpatient The patient requires at least 2 midnights for further evaluation and treatment of present condition.   Kayleen Memos MD [facility] Pager [phone]  If 7PM-7AM, please contact night-coverage [email or web address] Password [email or web address]  03/15/2022, 9:40 PM

## 2022-03-16 ENCOUNTER — Other Ambulatory Visit: Payer: Self-pay

## 2022-03-16 ENCOUNTER — Encounter (HOSPITAL_COMMUNITY): Payer: Self-pay | Admitting: Internal Medicine

## 2022-03-16 ENCOUNTER — Non-Acute Institutional Stay: Payer: Medicaid Other | Admitting: Internal Medicine

## 2022-03-16 ENCOUNTER — Ambulatory Visit: Payer: Medicare Other

## 2022-03-16 ENCOUNTER — Encounter: Payer: Self-pay | Admitting: Radiation Oncology

## 2022-03-16 ENCOUNTER — Ambulatory Visit
Admission: RE | Admit: 2022-03-16 | Discharge: 2022-03-16 | Disposition: A | Discharge status: Home / Self Care | Payer: Medicare Other | Source: Ambulatory Visit | Attending: Radiation Oncology | Admitting: Radiation Oncology

## 2022-03-16 DIAGNOSIS — Z7189 Other specified counseling: Secondary | ICD-10-CM | POA: Diagnosis not present

## 2022-03-16 DIAGNOSIS — C3490 Malignant neoplasm of unspecified part of unspecified bronchus or lung: Secondary | ICD-10-CM

## 2022-03-16 DIAGNOSIS — R338 Other retention of urine: Secondary | ICD-10-CM | POA: Diagnosis not present

## 2022-03-16 DIAGNOSIS — R627 Adult failure to thrive: Secondary | ICD-10-CM | POA: Diagnosis not present

## 2022-03-16 DIAGNOSIS — N179 Acute kidney failure, unspecified: Secondary | ICD-10-CM | POA: Diagnosis not present

## 2022-03-16 DIAGNOSIS — E43 Unspecified severe protein-calorie malnutrition: Secondary | ICD-10-CM | POA: Insufficient documentation

## 2022-03-16 DIAGNOSIS — Z515 Encounter for palliative care: Secondary | ICD-10-CM | POA: Diagnosis not present

## 2022-03-16 DIAGNOSIS — R531 Weakness: Secondary | ICD-10-CM | POA: Diagnosis not present

## 2022-03-16 LAB — RAD ONC ARIA SESSION SUMMARY
Course Elapsed Days: 28
Plan Fractions Treated to Date: 6
Plan Prescribed Dose Per Fraction: 1.8 Gy
Plan Total Fractions Prescribed: 20
Plan Total Prescribed Dose: 36 Gy
Reference Point Dosage Given to Date: 34.2 Gy
Reference Point Session Dosage Given: 1.8 Gy
Session Number: 19

## 2022-03-16 LAB — CBC WITH DIFFERENTIAL/PLATELET
Abs Immature Granulocytes: 0.34 10*3/uL — ABNORMAL HIGH (ref 0.00–0.07)
Basophils Absolute: 0 10*3/uL (ref 0.0–0.1)
Basophils Relative: 0 %
Eosinophils Absolute: 0 10*3/uL (ref 0.0–0.5)
Eosinophils Relative: 0 %
HCT: 29.9 % — ABNORMAL LOW (ref 39.0–52.0)
Hemoglobin: 10 g/dL — ABNORMAL LOW (ref 13.0–17.0)
Immature Granulocytes: 2 %
Lymphocytes Relative: 1 %
Lymphs Abs: 0.2 10*3/uL — ABNORMAL LOW (ref 0.7–4.0)
MCH: 27.5 pg (ref 26.0–34.0)
MCHC: 33.4 g/dL (ref 30.0–36.0)
MCV: 82.4 fL (ref 80.0–100.0)
Monocytes Absolute: 1.3 10*3/uL — ABNORMAL HIGH (ref 0.1–1.0)
Monocytes Relative: 9 %
Neutro Abs: 13.3 10*3/uL — ABNORMAL HIGH (ref 1.7–7.7)
Neutrophils Relative %: 88 %
Platelets: 459 10*3/uL — ABNORMAL HIGH (ref 150–400)
RBC: 3.63 MIL/uL — ABNORMAL LOW (ref 4.22–5.81)
RDW: 17.5 % — ABNORMAL HIGH (ref 11.5–15.5)
WBC: 15.1 10*3/uL — ABNORMAL HIGH (ref 4.0–10.5)
nRBC: 0.2 % (ref 0.0–0.2)

## 2022-03-16 LAB — LACTIC ACID, PLASMA: Lactic Acid, Venous: 3 mmol/L (ref 0.5–1.9)

## 2022-03-16 MED ORDER — ROSUVASTATIN CALCIUM 20 MG PO TABS: 40.0000 mg | ORAL_TABLET | Freq: Every day | ORAL | Status: DC

## 2022-03-16 MED ORDER — ENSURE ENLIVE PO LIQD
237.0000 mL | Freq: Three times a day (TID) | ORAL | Status: DC
  Administered 2022-03-16 – 2022-03-20 (×5): 237 mL via ORAL

## 2022-03-16 MED ORDER — ACETAMINOPHEN 325 MG PO TABS: 650.0000 mg | ORAL_TABLET | Freq: Four times a day (QID) | ORAL | Status: DC | PRN

## 2022-03-16 MED ORDER — LORAZEPAM 2 MG/ML IJ SOLN: 1.0000 mg | INTRAMUSCULAR | Status: DC | PRN

## 2022-03-16 MED ORDER — ONDANSETRON HCL 4 MG/2ML IJ SOLN: 4.0000 mg | Freq: Four times a day (QID) | INTRAMUSCULAR | Status: DC | PRN

## 2022-03-16 MED ORDER — ALBUTEROL SULFATE (2.5 MG/3ML) 0.083% IN NEBU: 3.0000 mL | INHALATION_SOLUTION | Freq: Four times a day (QID) | RESPIRATORY_TRACT | Status: DC | PRN

## 2022-03-16 MED ORDER — POLYETHYLENE GLYCOL 3350 17 G PO PACK
17.0000 g | PACK | Freq: Every day | ORAL | Status: DC
  Administered 2022-03-16 – 2022-03-17 (×2): 17 g via ORAL
  Filled 2022-03-16 (×2): qty 1

## 2022-03-16 MED ORDER — HYDROMORPHONE HCL 1 MG/ML IJ SOLN
0.5000 mg | INTRAMUSCULAR | Status: DC | PRN
  Administered 2022-03-17 (×2): 0.5 mg via INTRAVENOUS
  Filled 2022-03-16 (×2): qty 0.5

## 2022-03-16 MED ORDER — POLYVINYL ALCOHOL 1.4 % OP SOLN: 1.0000 [drp] | Freq: Four times a day (QID) | OPHTHALMIC | Status: DC | PRN

## 2022-03-16 MED ORDER — LATANOPROST 0.005 % OP SOLN
1.0000 [drp] | Freq: Every day | OPHTHALMIC | Status: DC
  Administered 2022-03-16 – 2022-03-21 (×6): 1 [drp] via OPHTHALMIC
  Filled 2022-03-16: qty 2.5

## 2022-03-16 MED ORDER — SODIUM CHLORIDE 0.9% FLUSH
3.0000 mL | Freq: Two times a day (BID) | INTRAVENOUS | Status: DC
  Administered 2022-03-16 – 2022-03-21 (×9): 3 mL via INTRAVENOUS

## 2022-03-16 MED ORDER — DOCUSATE SODIUM 100 MG PO CAPS
100.0000 mg | ORAL_CAPSULE | Freq: Every day | ORAL | Status: DC
  Administered 2022-03-18 – 2022-03-21 (×4): 100 mg via ORAL
  Filled 2022-03-16 (×5): qty 1

## 2022-03-16 MED ORDER — MELATONIN 5 MG PO TABS: 5.0000 mg | ORAL_TABLET | Freq: Every evening | ORAL | Status: DC | PRN

## 2022-03-16 MED ORDER — GLYCOPYRROLATE 0.2 MG/ML IJ SOLN: 0.2000 mg | INTRAMUSCULAR | Status: DC | PRN

## 2022-03-16 MED ORDER — ONDANSETRON 4 MG PO TBDP: 4.0000 mg | ORAL_TABLET | Freq: Four times a day (QID) | ORAL | Status: DC | PRN

## 2022-03-16 MED ORDER — OXYCODONE HCL 5 MG PO TABS
5.0000 mg | ORAL_TABLET | ORAL | Status: DC | PRN
  Administered 2022-03-16: 5 mg via ORAL
  Filled 2022-03-16: qty 1

## 2022-03-16 MED ORDER — LACTATED RINGERS IV BOLUS
1000.0000 mL | Freq: Once | INTRAVENOUS | Status: AC
  Administered 2022-03-16: 1000 mL via INTRAVENOUS

## 2022-03-16 MED ORDER — MOMETASONE FURO-FORMOTEROL FUM 200-5 MCG/ACT IN AERO
2.0000 | INHALATION_SPRAY | Freq: Two times a day (BID) | RESPIRATORY_TRACT | Status: DC
  Administered 2022-03-16 – 2022-03-21 (×9): 2 via RESPIRATORY_TRACT
  Filled 2022-03-16: qty 8.8

## 2022-03-16 MED ORDER — LORAZEPAM 2 MG/ML PO CONC: 1.0000 mg | ORAL | Status: DC | PRN

## 2022-03-16 MED ORDER — LORAZEPAM 1 MG PO TABS: 1.0000 mg | ORAL_TABLET | ORAL | Status: DC | PRN

## 2022-03-16 MED ORDER — GLYCOPYRROLATE 1 MG PO TABS: 1.0000 mg | ORAL_TABLET | ORAL | Status: DC | PRN

## 2022-03-16 MED ORDER — HALOPERIDOL LACTATE 5 MG/ML IJ SOLN: 0.5000 mg | INTRAMUSCULAR | Status: DC | PRN

## 2022-03-16 MED ORDER — ENSURE ENLIVE PO LIQD: 237.0000 mL | Freq: Three times a day (TID) | ORAL | Status: DC

## 2022-03-16 MED ORDER — PROCHLORPERAZINE EDISYLATE 10 MG/2ML IJ SOLN: 5.0000 mg | Freq: Four times a day (QID) | INTRAMUSCULAR | Status: DC | PRN

## 2022-03-16 MED ORDER — HALOPERIDOL 0.5 MG PO TABS: 0.5000 mg | ORAL_TABLET | ORAL | Status: DC | PRN

## 2022-03-16 MED ORDER — APIXABAN 5 MG PO TABS
5.0000 mg | ORAL_TABLET | Freq: Two times a day (BID) | ORAL | Status: DC
  Administered 2022-03-16: 5 mg via ORAL
  Filled 2022-03-16: qty 1

## 2022-03-16 MED ORDER — HYDROMORPHONE HCL 1 MG/ML IJ SOLN
0.5000 mg | INTRAMUSCULAR | Status: DC
  Administered 2022-03-16 – 2022-03-21 (×25): 0.5 mg via INTRAVENOUS
  Filled 2022-03-16 (×25): qty 0.5

## 2022-03-16 MED ORDER — APIXABAN 2.5 MG PO TABS: 2.5000 mg | ORAL_TABLET | Freq: Two times a day (BID) | ORAL | Status: DC

## 2022-03-16 MED ORDER — SODIUM CHLORIDE 0.9 % IV SOLN: INTRAVENOUS | Status: DC

## 2022-03-16 MED ORDER — HALOPERIDOL LACTATE 2 MG/ML PO CONC: 0.5000 mg | ORAL | Status: DC | PRN

## 2022-03-16 MED ORDER — TAMSULOSIN HCL 0.4 MG PO CAPS
0.4000 mg | ORAL_CAPSULE | Freq: Every day | ORAL | Status: DC
  Administered 2022-03-17 – 2022-03-19 (×3): 0.4 mg via ORAL
  Filled 2022-03-16 (×5): qty 1

## 2022-03-16 MED ORDER — CHLORHEXIDINE GLUCONATE CLOTH 2 % EX PADS
6.0000 | MEDICATED_PAD | Freq: Every day | CUTANEOUS | Status: DC
  Administered 2022-03-16 – 2022-03-17 (×2): 6 via TOPICAL

## 2022-03-16 MED ORDER — FINASTERIDE 5 MG PO TABS
5.0000 mg | ORAL_TABLET | Freq: Every day | ORAL | Status: DC
  Administered 2022-03-16 – 2022-03-21 (×6): 5 mg via ORAL
  Filled 2022-03-16 (×6): qty 1

## 2022-03-16 NOTE — Progress Notes (Signed)
Subjective:  tons of UOP-  pt being taken down to radiology -  not sure why-  he is able to give one word responses to my questions-  no follow up labs done since yesterday but do seem to be ordered for 1500 today   Objective Vital signs in last 24 hours: Vitals:   03/16/22 0208 03/16/22 0531 03/16/22 0903 03/16/22 1152  BP: 107/78 103/73 106/75 102/74  Pulse: (!) 115 (!) 102 (!) 104 (!) 103  Resp: 20 18 18 18   Temp: 97.6 F (36.4 C) 97.6 F (36.4 C) (!) 97.5 F (36.4 C) 97.9 F (36.6 C)  TempSrc: Oral Oral Oral   SpO2:  100% 99% 100%  Weight:      Height:       Weight change:   Intake/Output Summary (Last 24 hours) at 03/16/2022 1409 Last data filed at 03/16/2022 1300 Gross per 24 hour  Intake 1754.09 ml  Output 7700 ml  Net -5945.91 ml    Assessment/Plan  AKI on CKD 3a: Cr of 1.17 not normal eGFR for this man.  AKI d/t obstruction which is being relieved by Foley.  Underlying cause possibly BPH and UTI.               - Foley should remain in place             - medical management of hyperkalemia and acidosis              - he is cachectic and appears extremely frail             - would not do dialysis             - check BMP              - agree with palliative care consult- hospice and comfort measures appear appropriate at any time   2.  Hyperkalemia:             - received medical rx in the ED             - anticipate this will improve with relief of obstruction             - starting bicarb gtt    3.  Metabolic acidosis             - continue bicarb gtt @ 75 mL/ hr x 24 hrs   4.  NSCLC:             - chemotherapy on hold per Dr Worthy Flank note 03/01/22             - pallative c/s  5.  Volume-  will bolus with NS given such brisk UOP leading to hypotension and tachycardia        Jared Stevens A Jared Stevens    Labs: Basic Metabolic Panel: Recent Labs  Lab 03/15/22 1430  NA 136  K 5.9*  CL 96*  CO2 16*  GLUCOSE 93  BUN 214*  CREATININE 9.65*  CALCIUM  8.7*   Liver Function Tests: Recent Labs  Lab 03/15/22 1430  AST 68*  ALT 52*  ALKPHOS 145*  BILITOT 0.6  PROT 8.4*  ALBUMIN 2.2*   Recent Labs  Lab 03/15/22 1430  LIPASE 38   No results for input(s): "AMMONIA" in the last 168 hours. CBC: Recent Labs  Lab 03/15/22 1430  WBC 17.6*  NEUTROABS 16.3*  HGB 11.1*  HCT 33.9*  MCV 82.1  PLT 545*   Cardiac  Enzymes: No results for input(s): "CKTOTAL", "CKMB", "CKMBINDEX", "TROPONINI" in the last 168 hours. CBG: Recent Labs  Lab 03/15/22 1417  GLUCAP 93    Iron Studies: No results for input(s): "IRON", "TIBC", "TRANSFERRIN", "FERRITIN" in the last 72 hours. Studies/Results: US RENAL  Result Date: 03/15/2022 CLINICAL DATA:  Acute kidney injury in a 71 year old male. EXAM: RENAL / URINARY TRACT ULTRASOUND COMPLETE COMPARISON:  PET evaluation from November 30, 2021. FINDINGS: Right Kidney: Renal measurements: 12.3 x 5.1 x 7.4 cm = volume: 244.3 mL. Increased echogenicity of the RIGHT kidney with poor corticomedullary differentiation. Small cyst for which no additional dedicated follow-up imaging is recommended measuring 2 x 1.5 x 2.2 cm in the lateral anterior interpolar RIGHT kidney. Mild RIGHT renal pelvic dilation with mild infundibular distension without caliceal distension. Left Kidney: Renal measurements: 11.4 x 6.3 x 6.2 cm = volume: 231.5 mL. Mild to moderate LEFT hydronephrosis. Small cyst in the lateral LEFT kidney. Echogenic kidney with poor corticomedullary differentiation on the LEFT as well. Bladder: Foley present in the urinary bladder which remains moderately distended. Layering echogenic debris in the bladder base. On 1 image this as a convex margin at the RIGHT posterolateral urinary bladder. No substantial bladder wall thickening. Other: None. IMPRESSION: Mild to moderate LEFT and mild RIGHT hydronephrosis of uncertain etiology. No renal calculi are visible. There are signs of medical renal disease with echogenic bilateral  kidneys. Debris-filled bladder with Foley catheter remains moderately distended. This patient may benefit from flushing of the Foley catheter. Urologic assessment and follow-up renal sonogram may be helpful to exclude worsening of collecting system distension. Apparent debris in the bladder base shows convex margin perhaps related to patient position and favoring the RIGHT urinary bladder. This appearance could also be related to hematoma in the urinary bladder. Correlate with any signs of hematuria and consider follow-up imaging to exclude underlying lesion. Electronically Signed   By: Zetta Bills M.D.   On: 03/15/2022 18:48   DG Chest Port 1 View  Result Date: 03/15/2022 CLINICAL DATA:  CP EXAM: PORTABLE CHEST 1 VIEW COMPARISON:  Chest x-ray February 27, 2022. FINDINGS: Patient rotation. Unchanged masslike opacity along the right paratracheal stripe. Mildly improved aeration of the right upper lobe. Similar right-sided volume loss. No visible pneumothorax. Left lung is clear. IMPRESSION: Unchanged masslike opacity along the right paratracheal stripe. Mildly improved aeration of the right upper lobe. Electronically Signed   By: Margaretha Sheffield M.D.   On: 03/15/2022 15:24   Medications: Infusions:  cefTRIAXone (ROCEPHIN)  IV 2 g (03/16/22 1023)   sodium bicarbonate 150 mEq in dextrose 5 % 1,150 mL infusion Stopped (03/16/22 1059)    Scheduled Medications:  apixaban  2.5 mg Oral BID   Chlorhexidine Gluconate Cloth  6 each Topical Daily   docusate sodium  100 mg Oral Daily   feeding supplement  237 mL Oral TID BM   finasteride  5 mg Oral Daily   latanoprost  1 drop Both Eyes Daily   mometasone-formoterol  2 puff Inhalation BID   polyethylene glycol  17 g Oral Daily   tamsulosin  0.4 mg Oral QHS    have reviewed scheduled and prn medications.  Physical Exam: General: cachectic, one word answers Heart: tachy Lungs: clear Abdomen: distended-  tender Extremities: minimal  edema    03/16/2022,2:09 PM  LOS: 1 day

## 2022-03-16 NOTE — Consult Note (Signed)
Consultation Note Date: 03/16/2022   Patient Name: Jared Stevens  DOB: 1950-12-22  MRN: [id number]  Age / Sex: 71 y.o., male  PCP: [name], [name], NP Referring Physician: Hosie Poisson, MD  Reason for Consultation: Goals of care  HPI/Patient Profile: 71 y.o. male  with past medical history of recurrent stage IV lung cancer (was on chemotherapy and radiation), diastolic CHF, history of MCA CVA s/p thrombectomy, DM2, CAD, failure to thrive, recent admission for dehydration with d/c to SNF, now readmitted on 03/15/2022 with AKI, (Cr on admission 9.65), chest pain with elevated troponins, UTI. Lactic acid up at 3.0. Palliative medicine consulted for [place].   Primary Decision Maker NEXT OF KIN - daughter Jared Stevens  Discussion: Chart reviewed including labs, imaging, progress notes.  Evaluated patient- he is awake and alert, able to nod but not otherwise too weak to converse. He indicates he is having pain in his chest. He is very weak, appears frail and cachectic.  I called and spoke to his daughter Jared Stevens and she called and had her sister also on the line.  We discussed his kidney injury, UTI, demand ischemia in the setting of his very frail appearance and his overall failure to thrive.  Options for continued aggressive medical interventions with the focus of prolonging his life vs transition to full comfort measures only and supporting through end of life process with symptom management were considered.  Jared Stevens and her sister both verbalized that their Dad has been suffering for the last month. He is not eating or drinking. Their priority would be to focus on his comfort and support him through end of life.  We discussed comfort measures only entails stopping IV fluids, antibiotics, labs, radiation, and providing symptom management for pain, anxiety, SOB. All agreed this was best way to care for patient at  this time.    SUMMARY OF RECOMMENDATIONS -Transition to full comfort measures only -D/C labs, fluids, medications not contributing to comfort -Comfort medications entered- he is likely to need IV symptom management for pain -TOC order for inpatient hospice evaluation   Code Status/Advance Care Planning: DNR   Prognosis:   < 2 weeks due to UTI, acute kidney injury, general failure to thrive in the setting of lung cancer  Discharge Planning: Hospice facility pending evaluation and acceptance  Primary Diagnoses: Present on Admission: **None**  Physical Exam Vitals and nursing note reviewed.  Constitutional:      General: He is in acute distress.     Appearance: He is ill-appearing.     Comments: cachetic  Pulmonary:     Effort: Pulmonary effort is normal.  Musculoskeletal:     Comments: Diffuse muscle wasting  Neurological:     Mental Status: He is alert.     Vital Signs: BP 102/74 (BP Location: Left Arm)   Pulse (!) 103   Temp 97.9 F (36.6 C)   Resp 18   Ht 6\' 1"  (1.854 m)   Wt 49.6 kg   SpO2 100%   BMI 14.43 kg/m  Pain Scale: 0-10 POSS *See Group Information*: 1-Acceptable,Awake and alert Pain Score: Asleep   SpO2: SpO2: 100 % O2 Device:SpO2: 100 % O2 Flow Rate: .O2 Flow Rate (L/min): 2 L/min  IO: Intake/output summary:  Intake/Output Summary (Last 24 hours) at 03/16/2022 1637 Last data filed at 03/16/2022 1600 Gross per 24 hour  Intake 2281.5 ml  Output 7700 ml  Net -5418.5 ml    LBM: Last BM Date :  (UTA) Baseline Weight: Weight: 49.6 kg Most recent weight: Weight: 49.6 kg       Thank you for this consult. Palliative medicine will continue to follow and assist as needed.   Greater than 50%  of this time was spent counseling and coordinating care related to the above assessment and plan.  Signed by: Mariana Kaufman, AGNP-C Palliative Medicine    Please contact Palliative Medicine Team phone at [phone] for questions and concerns.  For  individual provider: See Shea Evans

## 2022-03-16 NOTE — Plan of Care (Signed)
°  Problem: Education: °Goal: Knowledge of General Education information will improve °Description: Including pain rating scale, medication(s)/side effects and non-pharmacologic comfort measures °Outcome: Progressing °  °Problem: Elimination: °Goal: Will not experience complications related to bowel motility °Outcome: Progressing °Goal: Will not experience complications related to urinary retention °Outcome: Progressing °  °Problem: Pain Managment: °Goal: General experience of comfort will improve °Outcome: Progressing °  °

## 2022-03-16 NOTE — Progress Notes (Signed)
Initial Nutrition Assessment  DOCUMENTATION CODES:   Severe malnutrition in context of chronic illness  INTERVENTION:  -Continue regular diet as tolerated -Provide Ensure Plus HP (350kcal, 20g protein/bottle) -Monitor labs and adjust ONS and diet as clinically appropriate  NUTRITION DIAGNOSIS:  Severe Malnutrition related to chronic illness (NSCLC) as evidenced by percent weight loss, severe fat depletion, severe muscle depletion.  GOAL:  Patient will meet greater than or equal to 90% of their needs  MONITOR:  PO intake, Supplement acceptance, Diet advancement  REASON FOR ASSESSMENT:  Consult Assessment of nutrition requirement/status  ASSESSMENT:  Pt is a 71yo M with PMH of COPD, hypercholesteremia, NSCLC receiving chemo and rad treatment currently, CAD, and CHF who presents with chest pain, FTT, and generalized weakness.  Visited pt at bedside this afternoon. He was not responding to questions verbally but would nod his head. Pt with a significant 12% unintended weight loss in the last month. He shows physical signs of malnutrition with severe muscle wasting and severe fat loss. Pt meets ASPEN criteria for severe protein calorie malnutrition.   Pt eating just 5% of meals today. Recommend starting Ensure Plus HP TID to provide 350kcal and 20g protein/bottle. Noted AKI, pt will receive just 1 ONS today. Check labs in AM and adjust ONS if not improved.  Medications reviewed and include: eliquis, colace, miralax, sodium bicarb  Labs reviewed: K:5.9, Cr:9.65, BUN:214, Alk Phos:145, Albumin:2.2, troponin peak:133, BNP:314, GFR:5, AST:68, ALT:52   NUTRITION - FOCUSED PHYSICAL EXAM:  Flowsheet Row Most Recent Value  Orbital Region Severe depletion  Upper Arm Region Severe depletion  Thoracic and Lumbar Region Severe depletion  Buccal Region Severe depletion  Temple Region Severe depletion  Clavicle Bone Region Severe depletion  Clavicle and Acromion Bone Region Severe depletion   Scapular Bone Region Severe depletion  Dorsal Hand Severe depletion  Patellar Region Severe depletion  Anterior Thigh Region Severe depletion  Posterior Calf Region Severe depletion  Hair Reviewed  Eyes Reviewed  Mouth Reviewed  Skin Reviewed  Nails Reviewed       Diet Order:   Diet Order             Diet regular Room service appropriate? Yes; Fluid consistency: Thin  Diet effective now                   EDUCATION NEEDS:  Not appropriate for education at this time  Skin:  Skin Assessment: Reviewed RN Assessment  Last BM:  unknown  Height:   Ht Readings from Last 1 Encounters:  03/16/22 _0  (1.854 m)    Weight:   Wt Readings from Last 1 Encounters:  03/16/22 49.6 kg    Ideal Body Weight:     BMI:  Body mass index is 14.43 kg/m.  Estimated Nutritional Needs:   Kcal:  1970-2370kcal (REE (1315) x 1.5-1.8)  Protein:  75-100g (1.5-2.0g/kg)  Fluid:  1930m  KCandise Bowens MS, RD, LDN, CNSC See AMiON for contact information

## 2022-03-16 NOTE — Social Work (Signed)
CSW spoke to [place] from [place] has agreed that if she has a bed for the Pt at the time of discharge she could take him back, as the Pt did come from [facility] however the family did not hold  a bed. TOC will do a new Fl2 as well INS Auth and resend the Pt out. TOC will continue to follow the Pt for DC needs.

## 2022-03-16 NOTE — Progress Notes (Signed)
[facility]                                                                               Jared Stevens, is a 71 y.o. male, DOB - 1950/10/01, HKV:[id number] Admit date - 03/15/2022    Outpatient Primary MD for the patient is [name], [name], NP  LOS - 1  days    Brief summary   Jared Stevens is a 71 y.o. 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  In the ED, work-up revealed severe AKI with creatinine of 9.65, GFR 5 from baseline creatinine of 0.9 and GFR greater than 60 for which nephrology was consulted. The patient was started on IV fluid isotonic bicarb drip and Rocephin empirically for presumptive UTI.  Admitted by [facility] hospitalist service.  Assessment & Plan    Assessment and Plan:  Generalized weakness secondary to dehydration, failure to thrive and AKI and possible UTI Start the patient on gentle hydration, treat the dehydration and started him on IV antibiotics.  Palliative care consulted for [place],    Sepsis sec to presumptive UTI:  - urine cultures done and results pending.  - currently on IV rocephin .    AKI with hyperkalemia and metabolic acidosis.  - creatinine at baseline less than 1.  - presented with creatinine of 9.65.  - probably secondary to pre renal causes.  - started on IV fluids and repeat labs this afternoon.  - continue with sodium bicarb gtt for metabolic acidosis.  - Nephrology on board    Severe Protein Calorie malnutrition:  - dietary on board and liberize diet as tolerated.     Anemia of chronic disease:  Monitor.    Prior h/o MCA CVA s/p thrombectomy  Continue with home eliquis for CVA prevention.   BPH with acute urinary retention Indwelling Foley catheter placed on 03/15/2022 Resume home finasteride and tamsulosin. Monitor urine output  RN Pressure Injury Documentation:    Malnutrition Type:  Nutrition Problem: Severe Malnutrition Etiology: chronic illness (NSCLC)   Malnutrition Characteristics:  Signs/Symptoms: percent weight loss, severe fat depletion, severe muscle depletion Percent weight loss: 12 % (in 1 month)   Nutrition Interventions:  Interventions: Ensure Enlive (each supplement provides 350kcal and 20 grams of protein), Liberalize Diet  Estimated body mass index is 14.43 kg/m as calculated from the following:   Height as of this encounter: 6\' 1"  (1.854 m).   Weight as of this encounter: 49.6 kg.  Code Status: DNR DVT Prophylaxis:  apixaban (ELIQUIS) tablet 2.5 mg Start: 03/16/22 2200 apixaban (ELIQUIS) tablet 2.5 mg   Level of Care: Level of care: Progressive Family Communication: none at bedside.   Disposition Plan:     Remains inpatient appropriate:  due to AKI and hyperkalemia, IV fluids.   Procedures:  US  renal.   Consultants:   Nephrology.  Palliative care.   Antimicrobials:   Anti-infectives (From admission, onward)    Start     Dose/Rate Route Frequency Ordered Stop   03/16/22 1000  cefTRIAXone (ROCEPHIN) 2 g in sodium chloride 0.9 % 100 mL IVPB        2 g 200 mL/hr over 30 Minutes Intravenous Every 24 hours 03/15/22 2151     03/15/22 1545  cefTRIAXone (ROCEPHIN) 1 g in sodium chloride 0.9 % 100 mL IVPB        1 g 200 mL/hr over 30 Minutes Intravenous  Once 03/15/22 1536 03/15/22 2017        Medications  Scheduled Meds:  apixaban  2.5 mg Oral BID   Chlorhexidine Gluconate Cloth  6 each Topical Daily   docusate sodium  100 mg Oral Daily   feeding supplement  237 mL Oral TID BM   finasteride   5 mg Oral Daily   latanoprost  1 drop Both Eyes Daily   mometasone-formoterol  2 puff Inhalation BID   polyethylene glycol  17 g Oral Daily   tamsulosin  0.4 mg Oral QHS   Continuous Infusions:  cefTRIAXone (ROCEPHIN)  IV 2 g (03/16/22 1023)   sodium bicarbonate 150 mEq in dextrose 5 % 1,150 mL infusion Stopped (03/16/22 1059)   PRN Meds:.acetaminophen, albuterol, melatonin, oxyCODONE, prochlorperazine    Subjective:   Jared Stevens was seen and examined today.  Appears dehydrated. No new complaints.  Objective:   Vitals:   03/16/22 0208 03/16/22 0531 03/16/22 0903 03/16/22 1152  BP: 107/78 103/73 106/75 102/74  Pulse: (!) 115 (!) 102 (!) 104 (!) 103  Resp: 20 18 18 18   Temp: 97.6 F (36.4 C) 97.6 F (36.4 C) (!) 97.5 F (36.4 C) 97.9 F (36.6 C)  TempSrc: Oral Oral Oral   SpO2:  100% 99% 100%  Weight:      Height:        Intake/Output Summary (Last 24 hours) at 03/16/2022 1410 Last data filed at 03/16/2022 1300 Gross per 24 hour  Intake 1754.09 ml  Output 7700 ml  Net -5945.91 ml   Filed Weights   03/16/22 0022  Weight: 49.6 kg     Exam General exam: cachetic looking gentleman, not in distress.  Respiratory system: Clear to auscultation. Respiratory effort normal. Cardiovascular system: S1 & S2 heard, RRR. No JVD,  No pedal edema. Gastrointestinal system: Abdomen is nondistended, soft and nontender. Normal bowel sounds heard. Central nervous system: Alert and oriented. No focal neurological deficits. Extremities: Symmetric 5 x 5 power. Skin: No rashes, Psychiatry: Mood & affect appropriate.     Data Reviewed:  I have personally reviewed following labs and imaging studies   CBC Lab Results  Component Value Date   WBC 17.6 (H) 03/15/2022   RBC 4.13 (L) 03/15/2022   HGB 11.1 (L) 03/15/2022   HCT 33.9 (L) 03/15/2022   MCV 82.1 03/15/2022   MCH 26.9 03/15/2022   PLT 545 (H) 03/15/2022   MCHC 32.7 03/15/2022   RDW 17.7 (H) 03/15/2022   LYMPHSABS  0.2 (L) 03/15/2022   MONOABS 0.8 03/15/2022   EOSABS 0.1 03/15/2022   BASOSABS 0.1 97/06/6376     Last metabolic panel Lab Results  Component Value Date   NA 136 03/15/2022   K 5.9 (H) 03/15/2022   CL 96 (L) 03/15/2022   CO2 16 (L) 03/15/2022   BUN 214 (H) 03/15/2022   CREATININE 9.65 (H) 03/15/2022   GLUCOSE  93 03/15/2022   GFRNONAA 5 (L) 03/15/2022   GFRAA >60 02/12/2020   CALCIUM 8.7 (L) 03/15/2022   PHOS 2.8 10/24/2018   PROT 8.4 (H) 03/15/2022   ALBUMIN 2.2 (L) 03/15/2022   LABGLOB 2.8 08/03/2020   AGRATIO 1.4 08/03/2020   BILITOT 0.6 03/15/2022   ALKPHOS 145 (H) 03/15/2022   AST 68 (H) 03/15/2022   ALT 52 (H) 03/15/2022   ANIONGAP 24 (H) 03/15/2022    CBG (last 3)  Recent Labs    03/15/22 1417  GLUCAP 93      Coagulation Profile: Recent Labs  Lab 03/15/22 1430  INR 2.5*     Radiology Studies: US RENAL  Result Date: 03/15/2022 CLINICAL DATA:  Acute kidney injury in a 71 year old male. EXAM: RENAL / URINARY TRACT ULTRASOUND COMPLETE COMPARISON:  PET evaluation from November 30, 2021. FINDINGS: Right Kidney: Renal measurements: 12.3 x 5.1 x 7.4 cm = volume: 244.3 mL. Increased echogenicity of the RIGHT kidney with poor corticomedullary differentiation. Small cyst for which no additional dedicated follow-up imaging is recommended measuring 2 x 1.5 x 2.2 cm in the lateral anterior interpolar RIGHT kidney. Mild RIGHT renal pelvic dilation with mild infundibular distension without caliceal distension. Left Kidney: Renal measurements: 11.4 x 6.3 x 6.2 cm = volume: 231.5 mL. Mild to moderate LEFT hydronephrosis. Small cyst in the lateral LEFT kidney. Echogenic kidney with poor corticomedullary differentiation on the LEFT as well. Bladder: Foley present in the urinary bladder which remains moderately distended. Layering echogenic debris in the bladder base. On 1 image this as a convex margin at the RIGHT posterolateral urinary bladder. No substantial bladder wall  thickening. Other: None. IMPRESSION: Mild to moderate LEFT and mild RIGHT hydronephrosis of uncertain etiology. No renal calculi are visible. There are signs of medical renal disease with echogenic bilateral kidneys. Debris-filled bladder with Foley catheter remains moderately distended. This patient may benefit from flushing of the Foley catheter. Urologic assessment and follow-up renal sonogram may be helpful to exclude worsening of collecting system distension. Apparent debris in the bladder base shows convex margin perhaps related to patient position and favoring the RIGHT urinary bladder. This appearance could also be related to hematoma in the urinary bladder. Correlate with any signs of hematuria and consider follow-up imaging to exclude underlying lesion. Electronically Signed   By: Zetta Bills M.D.   On: 03/15/2022 18:48   DG Chest Port 1 View  Result Date: 03/15/2022 CLINICAL DATA:  CP EXAM: PORTABLE CHEST 1 VIEW COMPARISON:  Chest x-ray February 27, 2022. FINDINGS: Patient rotation. Unchanged masslike opacity along the right paratracheal stripe. Mildly improved aeration of the right upper lobe. Similar right-sided volume loss. No visible pneumothorax. Left lung is clear. IMPRESSION: Unchanged masslike opacity along the right paratracheal stripe. Mildly improved aeration of the right upper lobe. Electronically Signed   By: Margaretha Sheffield M.D.   On: 03/15/2022 15:24       Hosie Poisson M.D. [facility] 03/16/2022, 2:10 PM  Available via Epic secure chat 7am-7pm After 7 pm, please refer to night coverage provider listed on amion.

## 2022-03-17 ENCOUNTER — Ambulatory Visit: Payer: Medicare Other

## 2022-03-17 ENCOUNTER — Encounter (HOSPITAL_COMMUNITY): Payer: Self-pay | Admitting: Internal Medicine

## 2022-03-17 DIAGNOSIS — R338 Other retention of urine: Secondary | ICD-10-CM | POA: Diagnosis not present

## 2022-03-17 DIAGNOSIS — G9340 Encephalopathy, unspecified: Secondary | ICD-10-CM | POA: Diagnosis not present

## 2022-03-17 DIAGNOSIS — R531 Weakness: Secondary | ICD-10-CM | POA: Diagnosis not present

## 2022-03-17 DIAGNOSIS — C3490 Malignant neoplasm of unspecified part of unspecified bronchus or lung: Secondary | ICD-10-CM | POA: Diagnosis not present

## 2022-03-17 DIAGNOSIS — N179 Acute kidney failure, unspecified: Secondary | ICD-10-CM | POA: Diagnosis not present

## 2022-03-17 NOTE — Progress Notes (Signed)
[facility]                                                                               [name], is a 71 y.o. male, DOB - 09/02/1950, DPO:[id number] Admit date - 03/15/2022    Outpatient Primary MD for the patient is [name], [name], NP  LOS - 2  days    Brief summary   Jared Stevens is a 71 y.o. 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  In the ED, work-up revealed severe AKI with creatinine of 9.65, GFR 5 from baseline creatinine of 0.9 and GFR greater than 60 for which nephrology was consulted. The patient was started on IV fluid isotonic bicarb drip and Rocephin empirically for presumptive UTI.  Admitted by [facility], hospitalist service.  Assessment & Plan    Assessment and Plan:  Generalized weakness secondary to dehydration, failure to thrive and AKI and possible UTI He was started on IV fluids and IV antibiotics.  Palliative care consulted for [place], family decided to transition to comfort measures.  TOC consulted for inpatient hospice evaluation.    Sepsis sec to presumptive UTI:  Urine cultures growing 20,000 colonies of gram neg rods.     AKI with hyperkalemia and metabolic acidosis.  - creatinine at baseline less than 1.  - presented with creatinine of 9.65.  - probably secondary to pre renal causes.  - started on IV fluids and repeat labs couldn't be done as phlebotomist couldn't get it done on 3 attempts.  - Nephrology on board and appreciate recommendations.  - in view of poor functional status,  palliative care consulted and his family transitioned to comfort measures.    Severe Protein Calorie malnutrition:  - dietary on board and liberize diet as tolerated.    Anemia of chronic disease:  Monitor.    Prior h/o MCA CVA s/p thrombectomy    BPH with acute urinary retention Indwelling Foley catheter placed on 03/15/2022   RN Pressure Injury Documentation:    Malnutrition Type:  Nutrition Problem: Severe Malnutrition Etiology: chronic illness (NSCLC)   Malnutrition Characteristics:  Signs/Symptoms: percent weight loss, severe fat depletion, severe muscle depletion Percent weight loss: 12 % (in 1 month)   Nutrition Interventions:  Interventions: Ensure Enlive (each supplement provides 350kcal and 20 grams of protein), Liberalize Diet  Estimated body mass index is 14.43 kg/m as calculated from the following:   Height as of this encounter: 6\' 1"  (1.854 m).   Weight as of this encounter: 49.6 kg.  Code Status: DNR DVT Prophylaxis:  comfort measures.    Level of Care: Level of care: Progressive Family Communication: none at bedside.   Disposition Plan:     Remains inpatient appropriate:  residential hospice facility.   Procedures:  US renal.  Consultants:   Nephrology.  Palliative care.   Antimicrobials:   Anti-infectives (From admission, onward)    Start     Dose/Rate Route Frequency Ordered Stop   03/16/22 1000  cefTRIAXone (ROCEPHIN) 2 g in sodium chloride 0.9 % 100 mL IVPB  Status:  Discontinued        2 g 200 mL/hr over 30 Minutes Intravenous Every 24 hours 03/15/22 2151 03/16/22 1643   03/15/22 1545  cefTRIAXone (ROCEPHIN) 1 g in sodium chloride 0.9 % 100 mL IVPB        1 g 200 mL/hr over 30 Minutes Intravenous  Once 03/15/22 1536 03/15/22 2017        Medications  Scheduled Meds:  Chlorhexidine Gluconate Cloth  6 each Topical Daily   docusate sodium  100 mg Oral Daily   feeding supplement  237 mL Oral TID BM   finasteride  5 mg  Oral Daily    HYDROmorphone (DILAUDID) injection  0.5 mg Intravenous Q4H   latanoprost  1 drop Both Eyes Daily   mometasone-formoterol  2 puff Inhalation BID   polyethylene glycol  17 g Oral Daily   sodium chloride flush  3 mL Intravenous Q12H   tamsulosin  0.4 mg Oral QHS   Continuous Infusions:   PRN Meds:.acetaminophen, albuterol, glycopyrrolate **OR** glycopyrrolate **OR** glycopyrrolate, haloperidol **OR** haloperidol **OR** haloperidol lactate, HYDROmorphone (DILAUDID) injection, LORazepam **OR** LORazepam **OR** LORazepam, melatonin, ondansetron **OR** ondansetron (ZOFRAN) IV, oxyCODONE, polyvinyl alcohol, prochlorperazine    Subjective:   Jared Stevens was seen and examined today.  Comfortable.  Objective:   Vitals:   03/16/22 0903 03/16/22 1152 03/16/22 2015 03/16/22 2048  BP: 106/75 102/74  96/68  Pulse: (!) 104 (!) 103  (!) 117  Resp: 18 18  16   Temp: (!) 97.5 F (36.4 C) 97.9 F (36.6 C)  97.8 F (36.6 C)  TempSrc: Oral   Oral  SpO2: 99% 100% 92% 95%  Weight:      Height:        Intake/Output Summary (Last 24 hours) at 03/17/2022 1047 Last data filed at 03/17/2022 0850 Gross per 24 hour  Intake 1607.41 ml  Output 2775 ml  Net -1167.59 ml    Filed Weights   03/16/22 0022  Weight: 49.6 kg     Exam General exam: cachetic looking, ill appearing lady, not in distress.  Respiratory system: Clear to auscultation. Respiratory effort normal. Cardiovascular system: S1 & S2 heard, RRR.  Gastrointestinal system: Abdomen is soft,  Central nervous system: Alert and answering simple questions.  Extremities: no pedal edema.  Skin: No rashes, l Psychiatry:  unable to assess.       Data Reviewed:  I have personally reviewed following labs and imaging studies   CBC Lab Results  Component Value Date   WBC 15.1 (H) 03/16/2022   RBC 3.63 (L) 03/16/2022   HGB 10.0 (L) 03/16/2022   HCT 29.9 (L) 03/16/2022   MCV 82.4 03/16/2022   MCH 27.5 03/16/2022   PLT  459 (H) 03/16/2022   MCHC 33.4 03/16/2022   RDW 17.5 (H) 03/16/2022   LYMPHSABS 0.2 (L) 03/16/2022   MONOABS 1.3 (H) 03/16/2022   EOSABS 0.0 03/16/2022   BASOSABS 0.0 11/19/7626     Last metabolic panel Lab Results  Component Value Date   NA 136 03/15/2022   K 5.9 (H) 03/15/2022   CL 96 (L) 03/15/2022   CO2 16 (L) 03/15/2022   BUN 214 (H) 03/15/2022   CREATININE 9.65 (H) 03/15/2022  GLUCOSE 93 03/15/2022   GFRNONAA 5 (L) 03/15/2022   GFRAA >60 02/12/2020   CALCIUM 8.7 (L) 03/15/2022   PHOS 2.8 10/24/2018   PROT 8.4 (H) 03/15/2022   ALBUMIN 2.2 (L) 03/15/2022   LABGLOB 2.8 08/03/2020   AGRATIO 1.4 08/03/2020   BILITOT 0.6 03/15/2022   ALKPHOS 145 (H) 03/15/2022   AST 68 (H) 03/15/2022   ALT 52 (H) 03/15/2022   ANIONGAP 24 (H) 03/15/2022    CBG (last 3)  Recent Labs    03/15/22 1417  GLUCAP 93       Coagulation Profile: Recent Labs  Lab 03/15/22 1430  INR 2.5*      Radiology Studies: US RENAL  Result Date: 03/15/2022 CLINICAL DATA:  Acute kidney injury in a 71 year old male. EXAM: RENAL / URINARY TRACT ULTRASOUND COMPLETE COMPARISON:  PET evaluation from November 30, 2021. FINDINGS: Right Kidney: Renal measurements: 12.3 x 5.1 x 7.4 cm = volume: 244.3 mL. Increased echogenicity of the RIGHT kidney with poor corticomedullary differentiation. Small cyst for which no additional dedicated follow-up imaging is recommended measuring 2 x 1.5 x 2.2 cm in the lateral anterior interpolar RIGHT kidney. Mild RIGHT renal pelvic dilation with mild infundibular distension without caliceal distension. Left Kidney: Renal measurements: 11.4 x 6.3 x 6.2 cm = volume: 231.5 mL. Mild to moderate LEFT hydronephrosis. Small cyst in the lateral LEFT kidney. Echogenic kidney with poor corticomedullary differentiation on the LEFT as well. Bladder: Foley present in the urinary bladder which remains moderately distended. Layering echogenic debris in the bladder base. On 1 image this as a convex  margin at the RIGHT posterolateral urinary bladder. No substantial bladder wall thickening. Other: None. IMPRESSION: Mild to moderate LEFT and mild RIGHT hydronephrosis of uncertain etiology. No renal calculi are visible. There are signs of medical renal disease with echogenic bilateral kidneys. Debris-filled bladder with Foley catheter remains moderately distended. This patient may benefit from flushing of the Foley catheter. Urologic assessment and follow-up renal sonogram may be helpful to exclude worsening of collecting system distension. Apparent debris in the bladder base shows convex margin perhaps related to patient position and favoring the RIGHT urinary bladder. This appearance could also be related to hematoma in the urinary bladder. Correlate with any signs of hematuria and consider follow-up imaging to exclude underlying lesion. Electronically Signed   By: Zetta Bills M.D.   On: 03/15/2022 18:48   DG Chest Port 1 View  Result Date: 03/15/2022 CLINICAL DATA:  CP EXAM: PORTABLE CHEST 1 VIEW COMPARISON:  Chest x-ray February 27, 2022. FINDINGS: Patient rotation. Unchanged masslike opacity along the right paratracheal stripe. Mildly improved aeration of the right upper lobe. Similar right-sided volume loss. No visible pneumothorax. Left lung is clear. IMPRESSION: Unchanged masslike opacity along the right paratracheal stripe. Mildly improved aeration of the right upper lobe. Electronically Signed   By: Margaretha Sheffield M.D.   On: 03/15/2022 15:24       Hosie Poisson M.D. [facility] 03/17/2022, 10:47 AM  Available via Epic secure chat 7am-7pm After 7 pm, please refer to night coverage provider listed on amion.

## 2022-03-17 NOTE — TOC Initial Note (Signed)
Transition of Care [facility]) - Initial/Assessment Note    Patient Details  Name: Jared Stevens MRN: [id number] Date of Birth: 1951-01-04  Transition of Care [facility]) CM/SW Contact:    Dessa Phi, RN Phone Number: 03/17/2022, 11:33 AM  Clinical Narrative: referral for residential hospice-spoke to dtr Jansae-agree to [place] to place referral to [facility]-await eval & acceptance.                  Expected Discharge Plan: [place] Barriers to Discharge: Continued Medical Work up   Patient Goals and CMS Choice Patient states their goals for this hospitalization and ongoing recovery are::  (Residential hospice) CMS Medicare.gov Compare Post Acute Care list provided to:: Patient Represenative (must comment) (Jansae(dtr)) Choice offered to / list presented to : Adult Children  Expected Discharge Plan and Services Expected Discharge Plan: [place]   Discharge Planning Services: CM Consult Post Acute Care Choice: Residential Hospice Bed Living arrangements for the past 2 months: [place]                                      Prior Living Arrangements/Services Living arrangements for the past 2 months: [place] Lives with:: Facility Resident Patient language and need for interpreter reviewed:: Yes Do you feel safe going back to the place where you live?: Yes      Need for Family Participation in Patient Care: Yes (Comment) Care giver support system in place?: Yes (comment)   Criminal Activity/Legal Involvement Pertinent to Current Situation/Hospitalization: No - Comment as needed  Activities of Daily Living Home Assistive Devices/Equipment: Eyeglasses, Gilford Rile (specify type) ADL Screening (condition at time of admission) Patient's cognitive ability adequate to safely complete daily activities?: No Is the patient deaf or have difficulty hearing?: No Does the patient have difficulty seeing, even when wearing  glasses/contacts?: No Does the patient have difficulty concentrating, remembering, or making decisions?: No Patient able to express need for assistance with ADLs?: Yes Does the patient have difficulty dressing or bathing?: Yes Independently performs ADLs?: No Communication: Independent Dressing (OT): Needs assistance Is this a change from baseline?: Pre-admission baseline Grooming: Needs assistance Is this a change from baseline?: Pre-admission baseline Feeding: Independent Bathing: Needs assistance Is this a change from baseline?: Pre-admission baseline Toileting: Needs assistance Is this a change from baseline?: Pre-admission baseline In/Out Bed: Needs assistance Is this a change from baseline?: Pre-admission baseline Walks in Home: Needs assistance Is this a change from baseline?: Pre-admission baseline Does the patient have difficulty walking or climbing stairs?: Yes Weakness of Legs: Both Weakness of Arms/Hands: Both  Permission Sought/Granted Permission sought to share information with : Case Manager Permission granted to share information with : Yes, Verbal Permission Granted  Share Information with NAME:  (Case manager)           Emotional Assessment Appearance:: Appears stated age Attitude/Demeanor/Rapport: Gracious Affect (typically observed): Accepting Orientation: : Oriented to Self, Oriented to Place, Oriented to  Time, Oriented to Situation Alcohol / Substance Use: Not Applicable Psych Involvement: No (comment)  Admission diagnosis:  Hyperkalemia [E87.5] Lower urinary tract infectious disease [N39.0] Atypical chest pain [R07.89] Elevated troponin [R79.89] Acute encephalopathy [G93.40] Generalized weakness [R53.1] Acute urinary retention [R33.8] Acute renal failure, unspecified acute renal failure type [facility]) [N17.9] Patient Active Problem List   Diagnosis Date Noted   Protein-calorie malnutrition, severe 03/16/2022   Generalized weakness 03/15/2022    Adult failure  to thrive 02/27/2022   AKI (acute kidney injury) ([place]) 02/27/2022   Supratherapeutic INR 02/27/2022   Hypokalemia 02/27/2022   Adenocarcinoma of right lung, stage 3 ([place]) 01/05/2022   Encounter for antineoplastic chemotherapy 01/05/2022   Lung mass 12/21/2021   Type 2 diabetes mellitus with hyperglycemia, without long-term current use of insulin ([place]) 11/23/2021   Peripheral neuropathy due to chemotherapy ([place]) 07/22/2021   CAD (coronary artery disease) 05/11/2021   Benign prostatic hyperplasia without lower urinary tract symptoms 01/19/2021   Inguinal hernia of left side without obstruction or gangrene 01/19/2021   Long term current use of anticoagulant therapy 01/01/2020   Chronic combined systolic and diastolic CHF (congestive heart failure) ([place]) 10/31/2019   Neurocognitive deficits 11/06/2018   History of CVA with residual deficit 10/21/2018   Middle cerebral artery embolism, right 10/21/2018   Recurrent right pleural effusion    History of lung cancer 09/27/2018   Elevated brain natriuretic peptide (BNP) level 09/27/2018   COPD (chronic obstructive pulmonary disease) ([place]) 09/27/2018   Hyperlipidemia 09/27/2018   Non-small cell lung cancer, right ([place]) 08/26/2018   PCP:  Flossie Buffy, NP Pharmacy:   [facility], [address]  [place] [address] Phone: [phone] Fax: [phone]     Social Determinants of Health (SDOH) Interventions    Readmission Risk Interventions     No data to display

## 2022-03-17 NOTE — Progress Notes (Signed)
WL [address] Manufacturing engineer [facility]) Hospital liaison Noted   Referral received from [place], Therapist, sports for [employer].   Visited patient at bedside, was alert yet pleasantly confused, feeding himself a cup of applesauce. Did not appear to be in any distress or in any pain during visit.   Spoke to patient's daughter, [name], via telephone to explain services. [name] requested that we reach back out to her tomorrow, so she could visit him.    [place] liaison to follow up tomorrow.   Zigmund Gottron, RN [facility] Liaison  [phone]

## 2022-03-17 NOTE — Plan of Care (Signed)
  Problem: Education: Goal: Knowledge of General Education information will improve Description: Including pain rating scale, medication(s)/side effects and non-pharmacologic comfort measures Outcome: Progressing   Problem: Health Behavior/Discharge Planning: Goal: Ability to manage health-related needs will improve Outcome: Progressing   Problem: Clinical Measurements: Goal: Ability to maintain clinical measurements within normal limits will improve Outcome: Adequate for Discharge Goal: Will remain free from infection Outcome: Adequate for Discharge Goal: Diagnostic test results will improve Outcome: Adequate for Discharge Goal: Respiratory complications will improve Outcome: Adequate for Discharge Goal: Cardiovascular complication will be avoided Outcome: Adequate for Discharge   Problem: Activity: Goal: Risk for activity intolerance will decrease Outcome: Adequate for Discharge   Problem: Nutrition: Goal: Adequate nutrition will be maintained Outcome: Adequate for Discharge   Problem: Coping: Goal: Level of anxiety will decrease Outcome: Adequate for Discharge   Problem: Elimination: Goal: Will not experience complications related to bowel motility Outcome: Adequate for Discharge Goal: Will not experience complications related to urinary retention Outcome: Adequate for Discharge   Problem: Pain Managment: Goal: General experience of comfort will improve Outcome: Adequate for Discharge   Problem: Safety: Goal: Ability to remain free from injury will improve Outcome: Adequate for Discharge   Problem: Skin Integrity: Goal: Risk for impaired skin integrity will decrease Outcome: Adequate for Discharge   Problem: Education: Goal: Knowledge of the prescribed therapeutic regimen will improve Outcome: Adequate for Discharge   Problem: Coping: Goal: Ability to identify and develop effective coping behavior will improve Outcome: Adequate for Discharge   Problem:  Clinical Measurements: Goal: Quality of life will improve Outcome: Adequate for Discharge   Problem: Respiratory: Goal: Verbalizations of increased ease of respirations will increase Outcome: Adequate for Discharge   Problem: Role Relationship: Goal: Family's ability to cope with current situation will improve Outcome: Adequate for Discharge Goal: Ability to verbalize concerns, feelings, and thoughts to partner or family member will improve Outcome: Adequate for Discharge   Problem: Pain Management: Goal: Satisfaction with pain management regimen will improve Outcome: Adequate for Discharge

## 2022-03-17 NOTE — Progress Notes (Signed)
Daily Progress Note   Patient Name: Jared Stevens       Date: 03/17/2022 DOB: Apr 13, 1951  Age: 71 y.o. MRN#: [id number] Attending Physician: Jared Poisson, MD Primary Care Physician: Jared Buffy, NP Admit Date: 03/15/2022  Reason for Consultation/Follow-up: Establishing goals of care  Patient Profile/HPI: 71 y.o. male  with past medical history of recurrent stage IV lung cancer (was on chemotherapy and radiation), diastolic CHF, history of MCA CVA s/p thrombectomy, DM2, CAD, failure to thrive, recent admission for dehydration with d/c to SNF, now readmitted on 03/15/2022 with AKI, (Cr on admission 9.65), chest pain with elevated troponins, UTI. Lactic acid up at 3.0. Palliative medicine consulted for [place].    Subjective: Jared Stevens is awake but lethargic. Continues not to eat or drink. Unable to converse but nods yes when asked if his pain is better. Attempted orientation questions but he did not answer.    Physical Exam Vitals and nursing note reviewed.             Vital Signs: BP 96/68 (BP Location: Left Arm)   Pulse (!) 117   Temp 97.8 F (36.6 C) (Oral)   Resp 16   Ht 6\' 1"  (1.854 m)   Wt 49.6 kg   SpO2 95%   BMI 14.43 kg/m  SpO2: SpO2: 95 % O2 Device: O2 Device: Room Air O2 Flow Rate: O2 Flow Rate (L/min): 2 L/min  Intake/output summary:  Intake/Output Summary (Last 24 hours) at 03/17/2022 1340 Last data filed at 03/17/2022 1144 Gross per 24 hour  Intake 1607.41 ml  Output 2825 ml  Net -1217.59 ml   LBM: Last BM Date :  (PTA) Baseline Weight: Weight: 49.6 kg Most recent weight: Weight: 49.6 kg       Palliative Assessment/Data: PPS: 10%      Patient Active Problem List   Diagnosis Date Noted   Protein-calorie malnutrition, severe 03/16/2022    Generalized weakness 03/15/2022   Adult failure to thrive 02/27/2022   AKI (acute kidney injury) ([place]) 02/27/2022   Supratherapeutic INR 02/27/2022   Hypokalemia 02/27/2022   Adenocarcinoma of right lung, stage 3 ([place]) 01/05/2022   Encounter for antineoplastic chemotherapy 01/05/2022   Lung mass 12/21/2021   Type 2 diabetes mellitus with hyperglycemia, without long-term current use of insulin ([place]) 11/23/2021   Peripheral neuropathy due to  chemotherapy ([place]) 07/22/2021   CAD (coronary artery disease) 05/11/2021   Benign prostatic hyperplasia without lower urinary tract symptoms 01/19/2021   Inguinal hernia of left side without obstruction or gangrene 01/19/2021   Long term current use of anticoagulant therapy 01/01/2020   Chronic combined systolic and diastolic CHF (congestive heart failure) ([place]) 10/31/2019   Neurocognitive deficits 11/06/2018   History of CVA with residual deficit 10/21/2018   Middle cerebral artery embolism, right 10/21/2018   Recurrent right pleural effusion    History of lung cancer 09/27/2018   Elevated brain natriuretic peptide (BNP) level 09/27/2018   COPD (chronic obstructive pulmonary disease) ([place]) 09/27/2018   Hyperlipidemia 09/27/2018   Non-small cell lung cancer, right ([place]) 08/26/2018    Palliative Care Assessment & Plan    Assessment/Recommendations/Plan  Patient at end of life- plan for comfort measures only Continue current comfort interventions Inpatient hospice evaluation pending- he will need IV symptom management for pain and other symptoms of dying   Code Status: DNR  Prognosis:  < 2 weeks  Discharge Planning: Hospice facility pending evaluation and acceptance  Thank you for allowing the Palliative Medicine Team to assist in the care of this patient.  Greater than 50%  of this time was spent counseling and coordinating care related to the above assessment and plan.  Jared Stevens, AGNP-C Palliative Medicine   Please contact  Palliative Medicine Team phone at [phone] for questions and concerns.

## 2022-03-18 DIAGNOSIS — C3490 Malignant neoplasm of unspecified part of unspecified bronchus or lung: Secondary | ICD-10-CM | POA: Diagnosis not present

## 2022-03-18 DIAGNOSIS — R627 Adult failure to thrive: Secondary | ICD-10-CM | POA: Diagnosis not present

## 2022-03-18 DIAGNOSIS — R531 Weakness: Secondary | ICD-10-CM | POA: Diagnosis not present

## 2022-03-18 DIAGNOSIS — N179 Acute kidney failure, unspecified: Secondary | ICD-10-CM | POA: Diagnosis not present

## 2022-03-18 LAB — GLUCOSE, CAPILLARY
Glucose-Capillary: 88 mg/dL (ref 70–99)
Glucose-Capillary: 170 mg/dL — ABNORMAL HIGH (ref 70–99)

## 2022-03-18 LAB — URINE CULTURE: Culture: 20000 — AB

## 2022-03-18 NOTE — Progress Notes (Signed)
Manufacturing engineer [facility]) [place]  Called to discuss [place] with Mr. Hippert dtr. She advised she is in [place] and would be on her way down later today. She had some concerns associated with the potential cost of [employer] and said she did not want to proceed with further discussions until she arrived.  Plans to meet with her tomorrow at the bedside @ 10 am to discuss transferring to [facility].  Thank you, Venia Carbon DNP, RN [facility] Liaison

## 2022-03-18 NOTE — Progress Notes (Signed)
[facility]                                                                               Chaitanya Amedee, is a 71 y.o. male, DOB - Sep 19, 1950, PFY:[id number] Admit date - 03/15/2022    Outpatient Primary MD for the patient is [name], [name], NP  LOS - 3  days    Brief summary   Jared Stevens is a 71 y.o. male with medical history significant for recurrence of right non-small cell lung cancer with ongoing chemotherapy and radiation, combined systolic and diastolic CHF, prior history of MCA CVA status post thrombectomy previously on Coumadin, now on Eliquis, type 2 diabetes, coronary disease, failure to thrive, recent admission for AKI (baseline creatinine 0.9 with GFR greater than 60) and severe hypokalemia, who presents to [facility] ED due to generalized weakness, failure to thrive, and chest pain.  In the ED, work-up revealed severe AKI with creatinine of 9.65, GFR 5 from baseline creatinine of 0.9 and GFR greater than 60 for which nephrology was consulted. The patient was started on IV fluid isotonic bicarb drip and Rocephin empirically for presumptive UTI.  Admitted by [facility], hospitalist service.  In view of his poor prognosis, poor functional status, palliative care consulted and he was transitioned to comfort measures.   Assessment & Plan    Assessment and Plan:  Generalized weakness secondary to dehydration, failure to thrive and AKI and possible UTI He was started on IV fluids and IV antibiotics.  Palliative care consulted for [place], family decided to transition to comfort measures.  TOC consulted for inpatient hospice evaluation.    Sepsis sec to presumptive UTI:  Urine cultures growing 20,000 colonies of gram neg rods.     AKI with hyperkalemia and metabolic acidosis.  - creatinine at baseline less than 1.  - presented with creatinine of 9.65.  - probably secondary to pre renal causes.  - started on IV fluids and repeat labs couldn't be done as phlebotomist  couldn't get it done on 3 attempts.  - Nephrology on board and appreciate recommendations.  - in view of poor functional status, palliative care consulted and his family transitioned to comfort measures.    Severe Protein Calorie malnutrition:  - dietary on board and liberize diet as tolerated.    Anemia of chronic disease:  Monitor.    Prior h/o MCA CVA s/p thrombectomy    BPH with acute urinary retention Indwelling Foley catheter placed on 03/15/2022   RN Pressure Injury Documentation:    Malnutrition Type:  Nutrition Problem: Severe Malnutrition Etiology: chronic illness (NSCLC)   Malnutrition Characteristics:  Signs/Symptoms: percent weight loss, severe fat depletion, severe muscle depletion Percent weight loss: 12 % (in 1 month)   Nutrition Interventions:  Interventions: Ensure Enlive (each supplement provides 350kcal and 20 grams of protein), Liberalize Diet  Estimated body mass index is 14.43 kg/m as calculated from the following:   Height as of this encounter: 6\' 1"  (1.854 m).   Weight as of this encounter: 49.6 kg.  Code Status: DNR DVT Prophylaxis:  comfort measures.    Level of Care: Level of care: Progressive Family Communication: none at bedside.  Disposition Plan:     Remains inpatient appropriate:  residential hospice facility.   Procedures:  US renal.   Consultants:   Nephrology.  Palliative care.   Antimicrobials:   Anti-infectives (From admission, onward)    Start     Dose/Rate Route Frequency Ordered Stop   03/16/22 1000  cefTRIAXone (ROCEPHIN) 2 g in sodium chloride 0.9 % 100 mL IVPB  Status:  Discontinued        2 g 200 mL/hr over 30 Minutes Intravenous Every 24 hours 03/15/22 2151 03/16/22 1643   03/15/22 1545  cefTRIAXone (ROCEPHIN) 1 g in sodium chloride 0.9 % 100 mL IVPB        1 g 200 mL/hr over 30 Minutes Intravenous  Once 03/15/22 1536 03/15/22 2017        Medications  Scheduled Meds:  docusate sodium  100 mg  Oral Daily   feeding supplement  237 mL Oral TID BM   finasteride  5 mg Oral Daily    HYDROmorphone (DILAUDID) injection  0.5 mg Intravenous Q4H   latanoprost  1 drop Both Eyes Daily   mometasone-formoterol  2 puff Inhalation BID   sodium chloride flush  3 mL Intravenous Q12H   tamsulosin  0.4 mg Oral QHS   Continuous Infusions:   PRN Meds:.acetaminophen, albuterol, glycopyrrolate **OR** glycopyrrolate **OR** glycopyrrolate, haloperidol **OR** haloperidol **OR** haloperidol lactate, HYDROmorphone (DILAUDID) injection, LORazepam **OR** LORazepam **OR** LORazepam, melatonin, ondansetron **OR** ondansetron (ZOFRAN) IV, oxyCODONE, polyvinyl alcohol, prochlorperazine    Subjective:   Jared Stevens was seen and examined today.  Comfortable.  Objective:   Vitals:   03/17/22 1937 03/17/22 2029 03/18/22 0721 03/18/22 1146  BP:  100/71  96/72  Pulse:  (!) 104  (!) 117  Resp:  16  19  Temp:  98 F (36.7 C)  97.9 F (36.6 C)  TempSrc:  Oral  Oral  SpO2: 94% 100% 99% 97%  Weight:      Height:        Intake/Output Summary (Last 24 hours) at 03/18/2022 1741 Last data filed at 03/18/2022 1100 Gross per 24 hour  Intake 720 ml  Output 2350 ml  Net -1630 ml    Filed Weights   03/16/22 0022  Weight: 49.6 kg     Exam General exam: Ill appearing gentleman, appears comfortable.  Respiratory system: Clear to auscultation. Respiratory effort normal. Cardiovascular system: S1 & S2 heard, RRR.  Gastrointestinal system: Abdomen is soft, bs+ Central nervous system: alert and comfortable.  Extremities; no pedal edema.  Skin: No rashes seen  Psychiatry: mood is appropriate.       Data Reviewed:  I have personally reviewed following labs and imaging studies   CBC Lab Results  Component Value Date   WBC 15.1 (H) 03/16/2022   RBC 3.63 (L) 03/16/2022   HGB 10.0 (L) 03/16/2022   HCT 29.9 (L) 03/16/2022   MCV 82.4 03/16/2022   MCH 27.5 03/16/2022   PLT 459 (H) 03/16/2022   MCHC  33.4 03/16/2022   RDW 17.5 (H) 03/16/2022   LYMPHSABS 0.2 (L) 03/16/2022   MONOABS 1.3 (H) 03/16/2022   EOSABS 0.0 03/16/2022   BASOSABS 0.0 09/62/8366     Last metabolic panel Lab Results  Component Value Date   NA 136 03/15/2022   K 5.9 (H) 03/15/2022   CL 96 (L) 03/15/2022   CO2 16 (L) 03/15/2022   BUN 214 (H) 03/15/2022   CREATININE 9.65 (H) 03/15/2022   GLUCOSE 93 03/15/2022   GFRNONAA 5 (L)  03/15/2022   GFRAA >60 02/12/2020   CALCIUM 8.7 (L) 03/15/2022   PHOS 2.8 10/24/2018   PROT 8.4 (H) 03/15/2022   ALBUMIN 2.2 (L) 03/15/2022   LABGLOB 2.8 08/03/2020   AGRATIO 1.4 08/03/2020   BILITOT 0.6 03/15/2022   ALKPHOS 145 (H) 03/15/2022   AST 68 (H) 03/15/2022   ALT 52 (H) 03/15/2022   ANIONGAP 24 (H) 03/15/2022    CBG (last 3)  Recent Labs    03/18/22 0715 03/18/22 1137  GLUCAP 88 170*       Coagulation Profile: Recent Labs  Lab 03/15/22 1430  INR 2.5*      Radiology Studies: No results found.     Hosie Poisson M.D. [facility] 03/18/2022, 5:41 PM  Available via Epic secure chat 7am-7pm After 7 pm, please refer to night coverage provider listed on amion.

## 2022-03-18 NOTE — Progress Notes (Addendum)
Daily Progress Note   Patient Name: Jared Stevens       Date: 03/18/2022 DOB: May 30, 1950  Age: 71 y.o. MRN#: [id number] Attending Physician: Hosie Poisson, MD Primary Care Physician: Flossie Buffy, NP Admit Date: 03/15/2022  Reason for Consultation/Follow-up: Establishing goals of care  Patient Profile/HPI: 71 y.o. male  with past medical history of recurrent stage IV lung cancer (was on chemotherapy and radiation), diastolic CHF, history of MCA CVA s/p thrombectomy, DM2, CAD, failure to thrive, recent admission for dehydration with d/c to SNF, now readmitted on 03/15/2022 with AKI, (Cr on admission 9.65), chest pain with elevated troponins, UTI. Lactic acid up at 3.0. Palliative medicine consulted for [place].    Subjective: [name] is sleeping soundly. Does not wake to my voice or light touch. Eating bites and sip.  Chart reviewed. Noted daughter [name] is planning to visit and speak with hospice today.   Physical Exam Vitals and nursing note reviewed.             Vital Signs: BP 100/71 (BP Location: Right Arm)   Pulse (!) 104   Temp 98 F (36.7 C) (Oral)   Resp 16   Ht 6\' 1"  (1.854 m)   Wt 49.6 kg   SpO2 99%   BMI 14.43 kg/m  SpO2: SpO2: 99 % O2 Device: O2 Device: Room Air O2 Flow Rate: O2 Flow Rate (L/min): 2 L/min  Intake/output summary:  Intake/Output Summary (Last 24 hours) at 03/18/2022 1054 Last data filed at 03/18/2022 1000 Gross per 24 hour  Intake 480 ml  Output 3650 ml  Net -3170 ml    LBM: Last BM Date :  (PTA) Baseline Weight: Weight: 49.6 kg Most recent weight: Weight: 49.6 kg       Palliative Assessment/Data: PPS: 10%      Patient Active Problem List   Diagnosis Date Noted   Protein-calorie malnutrition, severe 03/16/2022   Generalized  weakness 03/15/2022   Adult failure to thrive 02/27/2022   AKI (acute kidney injury) ([place]) 02/27/2022   Supratherapeutic INR 02/27/2022   Hypokalemia 02/27/2022   Adenocarcinoma of right lung, stage 3 ([place]) 01/05/2022   Encounter for antineoplastic chemotherapy 01/05/2022   Lung mass 12/21/2021   Type 2 diabetes mellitus with hyperglycemia, without long-term current use of insulin ([place]) 11/23/2021   Peripheral neuropathy due to  chemotherapy ([place]) 07/22/2021   CAD (coronary artery disease) 05/11/2021   Benign prostatic hyperplasia without lower urinary tract symptoms 01/19/2021   Inguinal hernia of left side without obstruction or gangrene 01/19/2021   Long term current use of anticoagulant therapy 01/01/2020   Chronic combined systolic and diastolic CHF (congestive heart failure) ([place]) 10/31/2019   Neurocognitive deficits 11/06/2018   History of CVA with residual deficit 10/21/2018   Middle cerebral artery embolism, right 10/21/2018   Recurrent right pleural effusion    History of lung cancer 09/27/2018   Elevated brain natriuretic peptide (BNP) level 09/27/2018   COPD (chronic obstructive pulmonary disease) ([place]) 09/27/2018   Hyperlipidemia 09/27/2018   Non-small cell lung cancer, right ([place]) 08/26/2018    Palliative Care Assessment & Plan    Assessment/Recommendations/Plan  Patient at end of life- plan for comfort measures only Continue current comfort interventions Inpatient hospice evaluation pending- he will need IV symptom management for pain and other symptoms of dying   Code Status: DNR  Prognosis:  < 2 weeks  Discharge Planning: Hospice facility pending evaluation and acceptance  Thank you for allowing the Palliative Medicine Team to assist in the care of this patient.  Greater than 50%  of this time was spent counseling and coordinating care related to the above assessment and plan.  Mariana Kaufman, AGNP-C Palliative Medicine   Please contact Palliative  Medicine Team phone at [phone] for questions and concerns.

## 2022-03-19 DIAGNOSIS — E43 Unspecified severe protein-calorie malnutrition: Secondary | ICD-10-CM

## 2022-03-19 DIAGNOSIS — C3490 Malignant neoplasm of unspecified part of unspecified bronchus or lung: Secondary | ICD-10-CM | POA: Diagnosis not present

## 2022-03-19 DIAGNOSIS — R531 Weakness: Secondary | ICD-10-CM | POA: Diagnosis not present

## 2022-03-19 DIAGNOSIS — Z515 Encounter for palliative care: Secondary | ICD-10-CM | POA: Diagnosis not present

## 2022-03-19 DIAGNOSIS — N179 Acute kidney failure, unspecified: Secondary | ICD-10-CM | POA: Diagnosis not present

## 2022-03-19 DIAGNOSIS — R627 Adult failure to thrive: Secondary | ICD-10-CM | POA: Diagnosis not present

## 2022-03-19 MED ORDER — NYSTATIN 100000 UNIT/ML MT SUSP
5.0000 mL | Freq: Two times a day (BID) | OROMUCOSAL | Status: DC
  Administered 2022-03-19 – 2022-03-21 (×4): 500000 [IU] via ORAL
  Filled 2022-03-19 (×5): qty 5

## 2022-03-19 NOTE — Progress Notes (Signed)
Manufacturing engineer (ACC)  Met with patient and family to discuss d/c planning to [facility].   Family agreeable, dtr Jansae at the bedside.   Mr. Junker is approved to transfer to [facility] when we have a bed open, unfortunately we do not have a bed today.  ACC will update family and hospital staff once our bed status changes.  Thank you, Venia Carbon DNP, RN [facility] Liaison  ([place] under hospice or epic chat)

## 2022-03-19 NOTE — TOC Progression Note (Signed)
Transition of Care [facility]) - Progression Note    Patient Details  Name: Jared Stevens MRN: [id number] Date of Birth: Jul 22, 1950  [facility]) CM/SW Contact  Henrietta Dine, RN Phone Number: 03/19/2022, 10:29 AM  Clinical Narrative:    Notified by Venia Carbon, Liason that pt approved for [facility]; no bed available today; TOC will con't to follow.   Expected Discharge Plan: [place] Barriers to Discharge: Continued Medical Work up  Expected Discharge Plan and Services Expected Discharge Plan: [place]   Discharge Planning Services: CM Consult Post Acute Care Choice: Residential Hospice Bed Living arrangements for the past 2 months: [place] Determinants of Health (SDOH) Interventions    Readmission Risk Interventions    03/17/2022   11:34 AM  Readmission Risk Prevention Plan  Transportation Screening Complete  Medication Review (RN Care Manager) Complete  PCP or Specialist appointment within 3-5 days of discharge Complete  HRI or [place] Complete  SW Recovery Care/Counseling Consult Complete  [place] Not Applicable

## 2022-03-19 NOTE — Progress Notes (Incomplete)
Palliative Medicine Progress Note   Patient Name: Jared Stevens       Date: 03/19/2022 DOB: [birth year]  Age: 71 y.o. MRN#: [id number] Attending Physician: Hosie Poisson, MD Primary Care Physician: Flossie Buffy, NP Admit Date: 03/15/2022  Reason for Consultation/Follow-up: {Reason for Consult:23484}  HPI/Patient Profile: 71 y.o. male  with past medical history of recurrent stage IV lung cancer (was on chemotherapy and radiation), diastolic CHF, history of MCA CVA s/p thrombectomy, DM2, CAD, failure to thrive, recent admission for dehydration with d/c to SNF, now readmitted on 03/15/2022 with AKI, (Cr on admission 9.65), chest pain with elevated troponins, UTI. Lactic acid up at 3.0. Palliative medicine consulted for [place].     Subjective: Chart reviewed. Note that [facility] does not have a bed today.   I visited patient at bedside. His daughter is present, feeding him bites of fruit.   Objective:  Physical Exam Vitals reviewed.  Constitutional:      General: He is not in acute distress.    Appearance: He is ill-appearing.     Comments: Frail  Pulmonary:     Effort: Pulmonary effort is normal.  Neurological:     Mental Status: He is alert.             Vital Signs: BP 94/67 (BP Location: Left Arm)   Pulse (!) 111   Temp 98.8 F (37.1 C) (Oral)   Resp 20   Ht 6\' 1"  (1.854 m)   Wt 49.6 kg   SpO2 93%   BMI 14.43 kg/m  SpO2: SpO2: 93 % O2 Device: O2 Device: Room Air O2 Flow Rate: O2 Flow Rate (L/min): 2 L/min    LBM: Last BM Date :  (PTA)     Palliative Assessment/Data: ***     Palliative Medicine Assessment & Plan   Assessment: Principal Problem:   Generalized weakness Active Problems:   Protein-calorie malnutrition, severe     Recommendations/Plan: Continue comfort care Plan for transfer to [facility] - awaiting a bed Nystatin oral suspension  Goals of Care and Additional Recommendations: Limitations on Scope of Treatment: {Recommended Scope and Preferences:21019}  Code Status: DNR/DNI as previously documented   Prognosis:  < 2 weeks  Discharge Planning: Hospice facility  Care plan was discussed with ***  Thank you for allowing the Palliative Medicine Team to  assist in the care of this patient.   ***   Lavena Bullion, NP   Please contact Palliative Medicine Team phone at [phone] for questions and concerns.  For individual providers, please see AMION.

## 2022-03-19 NOTE — Progress Notes (Signed)
[facility]                                                                               Jared Stevens, is a 71 y.o. male, DOB - 1950-08-08, XLK:[id number] Admit date - 03/15/2022    Outpatient Primary MD for the patient is [name], [name], NP  LOS - 4  days    Brief summary   Jared Stevens is a 71 y.o. 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  In the ED, work-up revealed severe AKI with creatinine of 9.65, GFR 5 from baseline creatinine of 0.9 and GFR greater than 60 for which nephrology was consulted. The patient was started on IV fluid isotonic bicarb drip and Rocephin empirically for presumptive UTI.  Admitted by [facility] hospitalist service.  In view of his poor prognosis, poor functional status, palliative care consulted and he was transitioned to comfort measures.   Assessment & Plan    Assessment and Plan:  Generalized weakness secondary to dehydration, failure to thrive and AKI and possible UTI He was started on IV fluids and IV antibiotics.  Palliative care consulted for [place], family decided to transition to comfort measures.  TOC consulted for inpatient hospice evaluation.    Sepsis sec to presumptive UTI:  Urine cultures growing 20,000 colonies of gram neg rods.     AKI with hyperkalemia and metabolic acidosis.  - creatinine at baseline less than 1.  - presented with creatinine of 9.65.  - probably secondary to pre renal causes.  - started on IV fluids and repeat labs couldn't be done as phlebotomist  couldn't get it done on 3 attempts.  - Nephrology on board and appreciate recommendations.  - in view of poor functional status, palliative care consulted and his family transitioned to comfort measures.  - no new complaints.    Severe Protein Calorie malnutrition:  - dietary on board and liberize diet as tolerated.    Anemia of chronic disease:  Monitor.    Prior h/o MCA CVA s/p thrombectomy    BPH with acute urinary retention Indwelling Foley catheter placed on 03/15/2022     Malnutrition Type:  Nutrition Problem: Severe Malnutrition Etiology: chronic illness (NSCLC)   Malnutrition Characteristics:  Signs/Symptoms: percent weight loss, severe fat depletion, severe muscle depletion Percent weight loss: 12 % (in 1 month)   Nutrition Interventions:  Interventions: Ensure Enlive (each supplement provides 350kcal and 20 grams of protein), Liberalize Diet  Estimated body mass index is 14.43 kg/m as calculated from the following:   Height as of this encounter: 6\' 1"  (1.854 m).   Weight as of this encounter: 49.6 kg.  Code Status: DNR DVT Prophylaxis:  comfort measures.    Level of Care: Level of care: Progressive Family Communication: none at bedside.  Disposition Plan:     Remains inpatient appropriate:  residential hospice facility.   Procedures:  US renal.   Consultants:   Nephrology.  Palliative care.   Antimicrobials:   Anti-infectives (From admission, onward)    Start     Dose/Rate Route Frequency Ordered Stop   03/16/22 1000  cefTRIAXone (ROCEPHIN) 2 g in sodium chloride 0.9 % 100 mL IVPB  Status:  Discontinued        2 g 200 mL/hr over 30 Minutes Intravenous Every 24 hours 03/15/22 2151 03/16/22 1643   03/15/22 1545  cefTRIAXone (ROCEPHIN) 1 g in sodium chloride 0.9 % 100 mL IVPB        1 g 200 mL/hr over 30 Minutes Intravenous  Once 03/15/22 1536 03/15/22 2017        Medications  Scheduled Meds:  docusate sodium  100 mg Oral Daily    feeding supplement  237 mL Oral TID BM   finasteride  5 mg Oral Daily    HYDROmorphone (DILAUDID) injection  0.5 mg Intravenous Q4H   latanoprost  1 drop Both Eyes Daily   mometasone-formoterol  2 puff Inhalation BID   sodium chloride flush  3 mL Intravenous Q12H   tamsulosin  0.4 mg Oral QHS   Continuous Infusions:   PRN Meds:.acetaminophen, albuterol, glycopyrrolate **OR** glycopyrrolate **OR** glycopyrrolate, haloperidol **OR** haloperidol **OR** haloperidol lactate, HYDROmorphone (DILAUDID) injection, LORazepam **OR** LORazepam **OR** LORazepam, melatonin, ondansetron **OR** ondansetron (ZOFRAN) IV, oxyCODONE, polyvinyl alcohol, prochlorperazine    Subjective:   Jared Stevens was seen and examined today.  He appears comfortable.  Objective:   Vitals:   03/18/22 1146 03/18/22 1951 03/18/22 2048 03/19/22 0847  BP: 96/72  94/67   Pulse: (!) 117  (!) 111   Resp: 19  20   Temp: 97.9 F (36.6 C)  98.8 F (37.1 C)   TempSrc: Oral  Oral   SpO2: 97% 97% 100% 93%  Weight:      Height:        Intake/Output Summary (Last 24 hours) at 03/19/2022 1131 Last data filed at 03/19/2022 1610 Gross per 24 hour  Intake 240 ml  Output 2125 ml  Net -1885 ml    Filed Weights   03/16/22 0022  Weight: 49.6 kg     Exam General exam: ill appearing gentleman.  Respiratory system: on [place] oxygen.  Cardiovascular system: S1 & S2 heard, RRR.  No pedal edema. Gastrointestinal system: Abdomen is soft, non tender.  Central nervous system: non verbal.  Extremities: no pedal edema.  Skin: no rashes.  Psychiatry: unable to assess.       Data Reviewed:  I have personally reviewed following labs and imaging studies   CBC Lab Results  Component Value Date   WBC 15.1 (H) 03/16/2022   RBC 3.63 (L) 03/16/2022   HGB 10.0 (L) 03/16/2022   HCT 29.9 (L) 03/16/2022   MCV 82.4 03/16/2022   MCH 27.5 03/16/2022   PLT 459 (H) 03/16/2022   MCHC 33.4 03/16/2022   RDW 17.5 (H) 03/16/2022    LYMPHSABS 0.2 (L) 03/16/2022   MONOABS 1.3 (H) 03/16/2022   EOSABS 0.0 03/16/2022   BASOSABS 0.0 96/08/5407     Last metabolic panel Lab Results  Component Value Date   NA 136 03/15/2022   K 5.9 (H) 03/15/2022   CL 96 (L) 03/15/2022   CO2 16 (L) 03/15/2022   BUN 214 (H) 03/15/2022   CREATININE 9.65 (H) 03/15/2022   GLUCOSE 93 03/15/2022   GFRNONAA 5 (  L) 03/15/2022   GFRAA >60 02/12/2020   CALCIUM 8.7 (L) 03/15/2022   PHOS 2.8 10/24/2018   PROT 8.4 (H) 03/15/2022   ALBUMIN 2.2 (L) 03/15/2022   LABGLOB 2.8 08/03/2020   AGRATIO 1.4 08/03/2020   BILITOT 0.6 03/15/2022   ALKPHOS 145 (H) 03/15/2022   AST 68 (H) 03/15/2022   ALT 52 (H) 03/15/2022   ANIONGAP 24 (H) 03/15/2022    CBG (last 3)  Recent Labs    03/18/22 0715 03/18/22 1137  GLUCAP 88 170*       Coagulation Profile: Recent Labs  Lab 03/15/22 1430  INR 2.5*      Radiology Studies: No results found.     Hosie Poisson M.D. [facility] 03/19/2022, 11:31 AM  Available via Epic secure chat 7am-7pm After 7 pm, please refer to night coverage provider listed on amion.

## 2022-03-20 ENCOUNTER — Ambulatory Visit: Payer: Medicare Other

## 2022-03-20 DIAGNOSIS — R627 Adult failure to thrive: Secondary | ICD-10-CM | POA: Diagnosis not present

## 2022-03-20 DIAGNOSIS — R531 Weakness: Secondary | ICD-10-CM | POA: Diagnosis not present

## 2022-03-20 DIAGNOSIS — N179 Acute kidney failure, unspecified: Secondary | ICD-10-CM | POA: Diagnosis not present

## 2022-03-20 DIAGNOSIS — C3490 Malignant neoplasm of unspecified part of unspecified bronchus or lung: Secondary | ICD-10-CM | POA: Diagnosis not present

## 2022-03-20 NOTE — TOC Progression Note (Signed)
Transition of Care [facility]) - Progression Note    Patient Details  Name: Jared Stevens MRN: [id number] Date of Birth: 08/21/1950  Transition [facility] Care [facility]) CM/SW Contact  Lenyx Boody, Juliann Pulse, RN Phone Number: 03/20/2022, 3:56 PM  Clinical Narrative: [place] bed availability.      Expected Discharge Plan: [place] Barriers to Discharge: Continued Medical Work up  Expected Discharge Plan and Services Expected Discharge Plan: [place]   Discharge Planning Services: CM Consult Post Acute Care Choice: Residential Hospice Bed Living arrangements for the past 2 months: [place] Determinants of Health (SDOH) Interventions    Readmission Risk Interventions    03/17/2022   11:34 AM  Readmission Risk Prevention Plan  Transportation Screening Complete  Medication Review (RN Care Manager) Complete  PCP or Specialist appointment within 3-5 days of discharge Complete  HRI or [place] Complete  SW Recovery Care/Counseling Consult Complete  [place] Not Applicable

## 2022-03-20 NOTE — Progress Notes (Signed)
Chaplain engaged in a conversation with Braycen's daughter, Cyncirae, this morning.  She cited that [place] documents had been forged by her sister who is also listed on the current paperwork. She voiced that the notary's stamp is from [place], where her sister lives, while Nikolas resides in [place].  She also showed how his signature on another document did not match the Healthcare POA documents.  Cyncirae expressed her dismay over not being included in medical conversations with her sister, as well as what she has perceived as fraud against her dad.  She noted that her name had been used to sign off on Craigory going to a nursing home.   Chaplain and notary present proposed to [place] that we could have a conversation with Kailen about his desires for his healthcare POA and do the paperwork over if he desired to do that.  Chaplain let her know that Eric has to consent to a change and he has to be able to convey what he would like for [place] to do.  Chaplain made sure to let daughter know that Osbaldo's agency and voice are important in this matter.    Chaplain assesses from [place] that he is involved in a complex family system with his children.  His children are in conflict regarding how to care for [facility].  Chaplain does believe that an Advanced Directive is important in Jamarion's situation.  Cyncirae voiced her willingness to be taken off the paperwork to make things easier.  Chaplain believes that it will be beneficial to talk to [place] alone to understand his needs.  Chaplain will follow-up.   Please page Chaplain tomorrow at [phone] for individual assessment.     03/20/22 1600  Clinical Encounter Type  Visited With Family  Visit Type Initial;Social support  Stress Factors  Family Stress Factors Family relationships;Exhausted;Lack of knowledge;Loss of control

## 2022-03-20 NOTE — Progress Notes (Signed)
[facility]                                                                               [name], is a 71 y.o. male, DOB - 1950-11-22, PIR:[id number] Admit date - 03/15/2022    Outpatient Primary MD for the patient is Stevens, Jared Brooke, NP  LOS - 5  days    Brief summary   Jared Stevens is a 71 y.o. male with medical history significant for recurrence of right non-small cell lung cancer with ongoing chemotherapy and radiation, combined systolic and diastolic CHF, prior history of MCA CVA status post thrombectomy previously on Coumadin, now on Eliquis, type 2 diabetes, coronary disease, failure to thrive, recent admission for AKI (baseline creatinine 0.9 with GFR greater than 60) and severe hypokalemia, who presents to [facility] ED due to generalized weakness, failure to thrive, and chest pain.  In the ED, work-up revealed severe AKI with creatinine of 9.65, GFR 5 from baseline creatinine of 0.9 and GFR greater than 60 for which nephrology was consulted. The patient was started on IV fluid isotonic bicarb drip and Rocephin empirically for presumptive UTI.  Admitted by [facility], hospitalist service.  In view of his poor prognosis, poor functional status, palliative care consulted and he was transitioned to comfort measures.  Patient seen and examined,appears comfortable, no new complaints.   Assessment & Plan    Assessment and Plan:  Generalized weakness secondary to dehydration, failure to thrive and AKI and possible UTI He was started on IV fluids and IV antibiotics.  Palliative care consulted for [place], family decided to transition to comfort measures.  TOC consulted for hospice facility.  He has been referred to [facility], currently no beds available today.    Sepsis sec to presumptive UTI:  Urine cultures growing 20,000 colonies of gram neg rods.     AKI with hyperkalemia and metabolic acidosis.  - creatinine at baseline less than 1.  - presented with  creatinine of 9.65.  - probably secondary to pre renal causes.  - started on IV fluids and repeat labs couldn't be done as phlebotomist couldn't get it done on 3 attempts.  - Nephrology on board and appreciate recommendations.  - in view of poor functional status, palliative care consulted and his family transitioned to comfort measures.  - no new complaints.    Severe Protein Calorie malnutrition:  - dietary on board and liberize diet as tolerated.    Anemia of chronic disease:  Monitor.    Prior h/o MCA CVA s/p thrombectomy    BPH with acute urinary retention Indwelling Foley catheter placed on 03/15/2022     Malnutrition Type:  Nutrition Problem: Severe Malnutrition Etiology: chronic illness (NSCLC)   Malnutrition Characteristics:  Signs/Symptoms: percent weight loss, severe fat depletion, severe muscle depletion Percent weight loss: 12 % (in 1 month)   Nutrition Interventions:  Interventions: Ensure Enlive (each supplement provides 350kcal and 20 grams of protein), Liberalize Diet  Estimated body mass index is 14.43 kg/m as calculated from the following:   Height as of this encounter: 6\' 1"  (1.854 m).   Weight as of this encounter: 49.6 kg.  Code Status: DNR DVT  Prophylaxis:  comfort measures.    Level of Care: Level of care: Progressive Family Communication: none at bedside.   Disposition Plan:     Remains inpatient appropriate:  residential hospice facility.   Procedures:  US renal.   Consultants:   Nephrology.  Palliative care.   Antimicrobials:   Anti-infectives (From admission, onward)    Start     Dose/Rate Route Frequency Ordered Stop   03/16/22 1000  cefTRIAXone (ROCEPHIN) 2 g in sodium chloride 0.9 % 100 mL IVPB  Status:  Discontinued        2 g 200 mL/hr over 30 Minutes Intravenous Every 24 hours 03/15/22 2151 03/16/22 1643   03/15/22 1545  cefTRIAXone (ROCEPHIN) 1 g in sodium chloride 0.9 % 100 mL IVPB        1 g 200 mL/hr over 30  Minutes Intravenous  Once 03/15/22 1536 03/15/22 2017        Medications  Scheduled Meds:  docusate sodium  100 mg Oral Daily   feeding supplement  237 mL Oral TID BM   finasteride  5 mg Oral Daily    HYDROmorphone (DILAUDID) injection  0.5 mg Intravenous Q4H   latanoprost  1 drop Both Eyes Daily   mometasone-formoterol  2 puff Inhalation BID   nystatin  5 mL Oral BID   sodium chloride flush  3 mL Intravenous Q12H   tamsulosin  0.4 mg Oral QHS   Continuous Infusions:   PRN Meds:.acetaminophen, albuterol, glycopyrrolate **OR** glycopyrrolate **OR** glycopyrrolate, haloperidol **OR** haloperidol **OR** haloperidol lactate, HYDROmorphone (DILAUDID) injection, LORazepam **OR** LORazepam **OR** LORazepam, melatonin, ondansetron **OR** ondansetron (ZOFRAN) IV, oxyCODONE, polyvinyl alcohol, prochlorperazine    Subjective:   Jared Stevens was seen and examined today.  Comfortable, not in distress.  Objective:   Vitals:   03/18/22 1951 03/18/22 2048 03/19/22 0847 03/19/22 1946  BP:  94/67  105/72  Pulse:  (!) 111  (!) 110  Resp:  20  20  Temp:  98.8 F (37.1 C)  (!) 97.5 F (36.4 C)  TempSrc:  Oral  Oral  SpO2: 97% 100% 93% 100%  Weight:      Height:        Intake/Output Summary (Last 24 hours) at 03/20/2022 1054 Last data filed at 03/20/2022 0908 Gross per 24 hour  Intake 360 ml  Output 1200 ml  Net -840 ml    Filed Weights   03/16/22 0022  Weight: 49.6 kg     Exam General exam: Ill appearing , but appears comfortable.  Respiratory system: air entry fair.  Cardiovascular system: S1 & S2 heard, RRR. No JVD,  No pedal edema. Gastrointestinal system: Abdomen is nondistended, soft and nontender. Normal bowel sounds heard. Central nervous system: Alert and oriented to person only.  Extremities: no edema.  Skin: No rashes,  Psychiatry: mood is appropriate.       Data Reviewed:  I have personally reviewed following labs and imaging studies   CBC Lab  Results  Component Value Date   WBC 15.1 (H) 03/16/2022   RBC 3.63 (L) 03/16/2022   HGB 10.0 (L) 03/16/2022   HCT 29.9 (L) 03/16/2022   MCV 82.4 03/16/2022   MCH 27.5 03/16/2022   PLT 459 (H) 03/16/2022   MCHC 33.4 03/16/2022   RDW 17.5 (H) 03/16/2022   LYMPHSABS 0.2 (L) 03/16/2022   MONOABS 1.3 (H) 03/16/2022   EOSABS 0.0 03/16/2022   BASOSABS 0.0 59/56/3875     Last metabolic panel Lab Results  Component Value Date  NA 136 03/15/2022   K 5.9 (H) 03/15/2022   CL 96 (L) 03/15/2022   CO2 16 (L) 03/15/2022   BUN 214 (H) 03/15/2022   CREATININE 9.65 (H) 03/15/2022   GLUCOSE 93 03/15/2022   GFRNONAA 5 (L) 03/15/2022   GFRAA >60 02/12/2020   CALCIUM 8.7 (L) 03/15/2022   PHOS 2.8 10/24/2018   PROT 8.4 (H) 03/15/2022   ALBUMIN 2.2 (L) 03/15/2022   LABGLOB 2.8 08/03/2020   AGRATIO 1.4 08/03/2020   BILITOT 0.6 03/15/2022   ALKPHOS 145 (H) 03/15/2022   AST 68 (H) 03/15/2022   ALT 52 (H) 03/15/2022   ANIONGAP 24 (H) 03/15/2022    CBG (last 3)  Recent Labs    03/18/22 0715 03/18/22 1137  GLUCAP 88 170*       Coagulation Profile: Recent Labs  Lab 03/15/22 1430  INR 2.5*      Radiology Studies: No results found.     Hosie Poisson M.D. [facility] 03/20/2022, 10:54 AM  Available via Epic secure chat 7am-7pm After 7 pm, please refer to night coverage provider listed on amion.

## 2022-03-20 NOTE — Progress Notes (Signed)
Manufacturing engineer [facility]) Hospital Liaison Note:  Checked in with bedside RN and visited patient to support this morning. ACC continues to follow this patient after patient was referred to [facility] this weekend.   Unfortunately, [facility] is not able to offer a room today. TOC and family made aware.  Family and TOC also made aware that an [place] will follow up tomorrow or sooner if a room becomes available.  Please do not hesitate to reach out with any hospice related concerns,  Gar Ponto, RN [facility] Liaison ([place]) [phone]

## 2022-03-21 ENCOUNTER — Ambulatory Visit: Payer: Medicare Other

## 2022-03-21 DIAGNOSIS — R338 Other retention of urine: Secondary | ICD-10-CM

## 2022-03-21 DIAGNOSIS — N179 Acute kidney failure, unspecified: Secondary | ICD-10-CM

## 2022-03-21 DIAGNOSIS — R531 Weakness: Secondary | ICD-10-CM

## 2022-03-21 MED ORDER — HALOPERIDOL 0.5 MG PO TABS: 0.5000 mg | ORAL_TABLET | ORAL | Status: AC | PRN

## 2022-03-21 MED ORDER — LORAZEPAM 1 MG PO TABS: 1.0000 mg | ORAL_TABLET | ORAL | 0 refills | Status: AC | PRN

## 2022-03-21 MED ORDER — ENSURE ENLIVE PO LIQD: 237.0000 mL | Freq: Three times a day (TID) | ORAL | 12 refills | Status: AC

## 2022-03-21 MED ORDER — GLYCOPYRROLATE 1 MG PO TABS: 1.0000 mg | ORAL_TABLET | ORAL | Status: AC | PRN

## 2022-03-21 NOTE — Telephone Encounter (Signed)
Did not charge no show fee, pt was admitted 03/15/2022 and still in the hospital.

## 2022-03-21 NOTE — Discharge Summary (Signed)
Physician Discharge Summary   Patient: Jared Stevens MRN: [id number] DOB: April 07, 1951  Admit date:     03/15/2022  Discharge date: 03/21/22  Discharge Physician: Hosie Poisson   PCP: Flossie Buffy, NP   Recommendations at discharge:  Please follow up with hospice MD as recommended.   Discharge Diagnoses: Principal Problem:   Generalized weakness Active Problems:   Protein-calorie malnutrition, severe    Hospital Course: Jared Stevens is a 71 y.o. male with medical history significant for recurrence of right non-small cell lung cancer with ongoing chemotherapy and radiation, combined systolic and diastolic CHF, prior history of MCA CVA status post thrombectomy previously on Coumadin, now on Eliquis, type 2 diabetes, coronary disease, failure to thrive, recent admission for AKI (baseline creatinine 0.9 with GFR greater than 60) and severe hypokalemia, who presents to [facility] ED due to generalized weakness, failure to thrive, and chest pain.  In the ED, work-up revealed severe AKI with creatinine of 9.65, GFR 5 from baseline creatinine of 0.9 and GFR greater than 60 for which nephrology was consulted. The patient was started on IV fluid isotonic bicarb drip and Rocephin empirically for presumptive UTI.  Admitted by [facility], hospitalist service.   In view of his poor prognosis, poor functional status, palliative care consulted and he was transitioned to comfort measures.  Patient seen and examined,appears comfortable, no new complaints.   Assessment and Plan:  Generalized weakness secondary to dehydration, failure to thrive and AKI and possible UTI He was started on IV fluids and IV antibiotics.  Palliative care consulted for [place], family decided to transition to comfort measures.  TOC consulted for hospice facility.       Sepsis sec to presumptive UTI:  Urine cultures growing 20,000 colonies of gram neg rods.        AKI with hyperkalemia and metabolic acidosis.  - creatinine at  baseline less than 1.  - presented with creatinine of 9.65.  - probably secondary to pre renal causes.  - started on IV fluids and repeat labs couldn't be done as phlebotomist couldn't get it done on 3 attempts.  - Nephrology on board and appreciate recommendations.  - in view of poor functional status, palliative care consulted and his family transitioned to comfort measures.  - no new complaints.    Severe Protein Calorie malnutrition:  - dietary on board and liberize diet as tolerated.      Anemia of chronic disease:  Monitor.      Prior h/o MCA CVA s/p thrombectomy      BPH with acute urinary retention Indwelling Foley catheter placed on 03/15/2022         Malnutrition Type:   Nutrition Problem: Severe Malnutrition Etiology: chronic illness (NSCLC)     Malnutrition Characteristics:   Signs/Symptoms: percent weight loss, severe fat depletion, severe muscle depletion Percent weight loss: 12 % (in 1 month)     Nutrition Interventions:   Interventions: Ensure Enlive (each supplement provides 350kcal and 20 grams of protein), Liberalize Diet   Estimated body mass index is 14.43 kg/m as calculated from the following:   Height as of this encounter: 6\' 1"  (1.854 m).   Weight as of this encounter: 49.6 kg.     Consultants: [place].  Procedures performed: none.   Disposition: Hospice care Diet recommendation:  Discharge Diet Orders (From admission, onward)     Start     Ordered   03/21/22 0000  Diet - low sodium heart healthy  03/21/22 1257           Regular diet DISCHARGE MEDICATION: Allergies as of 03/21/2022   No Known Allergies      Medication List     STOP taking these medications    apixaban 5 MG Tabs tablet Commonly known as: ELIQUIS   ondansetron 8 MG disintegrating tablet Commonly known as: ZOFRAN-ODT   oxyCODONE 5 MG immediate release tablet Commonly known as: Oxy IR/ROXICODONE   polyethylene glycol 17 g  packet Commonly known as: MiraLax   rosuvastatin 40 MG tablet Commonly known as: CRESTOR       TAKE these medications    albuterol 108 (90 Base) MCG/ACT inhaler Commonly known as: VENTOLIN HFA Inhale 1-2 puffs into the lungs every 6 (six) hours as needed for wheezing or shortness of breath. Patient states he takes only as needed.   budesonide-formoterol 160-4.5 MCG/ACT inhaler Commonly known as: Symbicort INHALE TWO PUFFS INTO THE LUNGS TWICE DAILY (IN THE MORNING AND IN THE EVENING) What changed:  how much to take how to take this when to take this additional instructions   docusate sodium 100 MG capsule Commonly known as: Colace Take 1 capsule (100 mg total) by mouth daily.   feeding supplement Liqd Take 237 mLs by mouth 3 (three) times daily between meals.   finasteride 5 MG tablet Commonly known as: PROSCAR Take 5 mg by mouth daily.   glycopyrrolate 1 MG tablet Commonly known as: ROBINUL Take 1 tablet (1 mg total) by mouth every 4 (four) hours as needed (excessive secretions).   haloperidol 0.5 MG tablet Commonly known as: HALDOL Take 1 tablet (0.5 mg total) by mouth every 4 (four) hours as needed for agitation (or delirium).   latanoprost 0.005 % ophthalmic solution Commonly known as: XALATAN Place 1 drop into both eyes daily.   LORazepam 1 MG tablet Commonly known as: ATIVAN Take 1 tablet (1 mg total) by mouth every 4 (four) hours as needed for anxiety.   tamsulosin 0.4 MG Caps capsule Commonly known as: FLOMAX Take 0.4 mg by mouth at bedtime.        Discharge Exam: Filed Weights   03/16/22 0022  Weight: 49.6 kg   General exam: Appears calm and comfortable  Respiratory system: Clear to auscultation. Respiratory effort normal. Cardiovascular system: S1 & S2 heard, RRR. No pedal edema. Gastrointestinal system: Abdomen is soft, non tender.  Central nervous system: Alert  and comfortable.  Extremities: Symmetric 5 x 5 power. Skin: No rashes,  lesions or ulcers Psychiatry: mood appropriate.    Condition at discharge: fair  The results of significant diagnostics from this hospitalization (including imaging, microbiology, ancillary and laboratory) are listed below for reference.   Imaging Studies: US RENAL  Result Date: 03/15/2022 CLINICAL DATA:  Acute kidney injury in a 71 year old male. EXAM: RENAL / URINARY TRACT ULTRASOUND COMPLETE COMPARISON:  PET evaluation from November 30, 2021. FINDINGS: Right Kidney: Renal measurements: 12.3 x 5.1 x 7.4 cm = volume: 244.3 mL. Increased echogenicity of the RIGHT kidney with poor corticomedullary differentiation. Small cyst for which no additional dedicated follow-up imaging is recommended measuring 2 x 1.5 x 2.2 cm in the lateral anterior interpolar RIGHT kidney. Mild RIGHT renal pelvic dilation with mild infundibular distension without caliceal distension. Left Kidney: Renal measurements: 11.4 x 6.3 x 6.2 cm = volume: 231.5 mL. Mild to moderate LEFT hydronephrosis. Small cyst in the lateral LEFT kidney. Echogenic kidney with poor corticomedullary differentiation on the LEFT as well. Bladder: Foley present in the  urinary bladder which remains moderately distended. Layering echogenic debris in the bladder base. On 1 image this as a convex margin at the RIGHT posterolateral urinary bladder. No substantial bladder wall thickening. Other: None. IMPRESSION: Mild to moderate LEFT and mild RIGHT hydronephrosis of uncertain etiology. No renal calculi are visible. There are signs of medical renal disease with echogenic bilateral kidneys. Debris-filled bladder with Foley catheter remains moderately distended. This patient may benefit from flushing of the Foley catheter. Urologic assessment and follow-up renal sonogram may be helpful to exclude worsening of collecting system distension. Apparent debris in the bladder base shows convex margin perhaps related to patient position and favoring the RIGHT urinary bladder.  This appearance could also be related to hematoma in the urinary bladder. Correlate with any signs of hematuria and consider follow-up imaging to exclude underlying lesion. Electronically Signed   By: Zetta Bills M.D.   On: 03/15/2022 18:48   DG Chest Port 1 View  Result Date: 03/15/2022 CLINICAL DATA:  CP EXAM: PORTABLE CHEST 1 VIEW COMPARISON:  Chest x-ray February 27, 2022. FINDINGS: Patient rotation. Unchanged masslike opacity along the right paratracheal stripe. Mildly improved aeration of the right upper lobe. Similar right-sided volume loss. No visible pneumothorax. Left lung is clear. IMPRESSION: Unchanged masslike opacity along the right paratracheal stripe. Mildly improved aeration of the right upper lobe. Electronically Signed   By: Margaretha Sheffield M.D.   On: 03/15/2022 15:24   DG Chest Port 1 View  Result Date: 02/27/2022 CLINICAL DATA:  Weakness EXAM: PORTABLE CHEST 1 VIEW COMPARISON:  Chest radiograph dated 12/27/2021. PET-CT dated 11/30/2021. FINDINGS: Masslike opacity along the right paratracheal stripe, favoring progressive tumor when correlating with priors. Associated right upper lobe opacity, likely post obstructive, although lymphangitic tumor is also possible. Volume loss in the right hemithorax. Left lung is clear. No pneumothorax. The heart is normal in size.  Right cardiomediastinal shift. IMPRESSION: Masslike opacity along the right paratracheal stripe, favoring progressive tumor when correlating with priors. Associated right upper lobe opacity, likely post obstructive, although lymphangitic tumor is also possible. Electronically Signed   By: Julian Hy M.D.   On: 02/27/2022 02:40    Microbiology: Results for orders placed or performed during the hospital encounter of 03/15/22  Urine Culture     Status: Abnormal   Collection Time: 03/16/22  4:58 AM   Specimen: Urine, Clean Catch  Result Value Ref Range Status   Specimen Description   Final    URINE, CLEAN  CATCH Performed at [facility], [place] [address] [place], [place] [address]    Special Requests   Final    Immunocompromised Performed at [facility], [place] [address] [place], [place] [address]    Culture 20,000 COLONIES/mL ESCHERICHIA COLI (A)  Final   Report Status 03/18/2022 FINAL  Final   Organism ID, Bacteria ESCHERICHIA COLI (A)  Final      Susceptibility   Escherichia coli - MIC*    AMPICILLIN <=2 SENSITIVE Sensitive     CEFAZOLIN <=4 SENSITIVE Sensitive     CEFTRIAXONE <=0.25 SENSITIVE Sensitive     CIPROFLOXACIN <=0.25 SENSITIVE Sensitive     GENTAMICIN <=1 SENSITIVE Sensitive     IMIPENEM <=0.25 SENSITIVE Sensitive     NITROFURANTOIN <=16 SENSITIVE Sensitive     TRIMETH/SULFA <=20 SENSITIVE Sensitive     AMPICILLIN/SULBACTAM <=2 SENSITIVE Sensitive     * 20,000 COLONIES/mL ESCHERICHIA COLI    Labs: CBC: Recent Labs  Lab 03/15/22 1430 03/16/22 1514  WBC 17.6* 15.1*  NEUTROABS 16.3* 13.3*  HGB 11.1* 10.0*  HCT 33.9* 29.9*  MCV 82.1 82.4  PLT 545* 789*   Basic Metabolic Panel: Recent Labs  Lab 03/15/22 1430  NA 136  K 5.9*  CL 96*  CO2 16*  GLUCOSE 93  BUN 214*  CREATININE 9.65*  CALCIUM 8.7*   Liver Function Tests: Recent Labs  Lab 03/15/22 1430  AST 68*  ALT 52*  ALKPHOS 145*  BILITOT 0.6  PROT 8.4*  ALBUMIN 2.2*   CBG: Recent Labs  Lab 03/15/22 1417 03/18/22 0715 03/18/22 1137  GLUCAP 93 88 170*    Discharge time spent: 35 minutes.   Signed: Hosie Poisson, MD [facility] 03/21/2022

## 2022-03-21 NOTE — Progress Notes (Addendum)
Manufacturing engineer [facility]) Hospital Liaison Note   Unfortunately, [facility] is not able to offer a room today. Family and Kathy/TOC Manager aware hospital liaison will follow up tomorrow or sooner if a room becomes available.    Addendum   A bed became available for [facility] admission and daughter/Jansae receptive and bed offer accepted.  Consent forms have been completed.  PTAR notified of patient D/C and transport arranged by [facility] and Attending Physician/Dr. Karleen Hampshire also notified of transport arrangement.    Please send signed DNR form with patient and RN call report to [phone].   Daphene Calamity, MSW [facility] Hospital Liaison [phone]

## 2022-03-21 NOTE — Plan of Care (Signed)
  Problem: Clinical Measurements: Goal: Ability to maintain clinical measurements within normal limits will improve Outcome: Adequate for Discharge Goal: Will remain free from infection Outcome: Adequate for Discharge Goal: Diagnostic test results will improve Outcome: Adequate for Discharge Goal: Respiratory complications will improve Outcome: Adequate for Discharge Goal: Cardiovascular complication will be avoided Outcome: Adequate for Discharge   Problem: Activity: Goal: Risk for activity intolerance will decrease Outcome: Adequate for Discharge   Problem: Nutrition: Goal: Adequate nutrition will be maintained Outcome: Adequate for Discharge   Problem: Coping: Goal: Level of anxiety will decrease Outcome: Adequate for Discharge   Problem: Elimination: Goal: Will not experience complications related to bowel motility Outcome: Adequate for Discharge   Problem: Pain Managment: Goal: General experience of comfort will improve Outcome: Adequate for Discharge   Problem: Safety: Goal: Ability to remain free from injury will improve Outcome: Adequate for Discharge   Problem: Skin Integrity: Goal: Risk for impaired skin integrity will decrease Outcome: Adequate for Discharge   Problem: Education: Goal: Knowledge of the prescribed therapeutic regimen will improve Outcome: Adequate for Discharge   Problem: Coping: Goal: Ability to identify and develop effective coping behavior will improve Outcome: Adequate for Discharge   Problem: Clinical Measurements: Goal: Quality of life will improve Outcome: Adequate for Discharge   Problem: Respiratory: Goal: Verbalizations of increased ease of respirations will increase Outcome: Adequate for Discharge   Problem: Role Relationship: Goal: Family's ability to cope with current situation will improve Outcome: Adequate for Discharge Goal: Ability to verbalize concerns, feelings, and thoughts to partner or family member will  improve Outcome: Adequate for Discharge   Problem: Pain Management: Goal: Satisfaction with pain management regimen will improve Outcome: Adequate for Discharge

## 2022-03-21 NOTE — Care Management Important Message (Signed)
Important Message  Patient Details Hospice Name: Jared Stevens MRN: [id number] Date of Birth: 16-Apr-1951   Medicare Important Message Given:  No     [name] 03/21/2022, 1:55 PM

## 2022-03-21 NOTE — Consult Note (Signed)
   [facility] Inpatient Consult   03/21/2022  Jared Stevens 09/26/1950 [id number]  [place] Organization [ACO] Patient: Jared Stevens  Primary Care Provider: [name], [name], NP, [place] at [facility]  Patient had been active in [place] with a [place].   Chart reviewed and reveals the patient is currently transitioning to Hospice/Palliative Care.   Plan: Given this choice patient will have full case management services through Hospice and needs will be met at the hospice level of care. No [facility] Care Management is planned for transitional needs. Will notify the [facility] RN CC of disposition expected with hospice care.  For questions,   Natividad Brood, RN BSN [place]  [phone] business mobile phone Toll free office [phone]  *[place]  [phone] Fax number: [phone] [email or web address]

## 2022-03-21 NOTE — TOC Transition Note (Signed)
Transition of Care [facility]) - CM/SW Discharge Note   Patient Details  Name: Yuvin Bussiere MRN: [id number] Date of Birth: October 29, 1950  Transition of [facility]) CM/SW Contact:  Dessa Phi, RN Phone Number: 03/21/2022, 3:31 PM   Clinical Narrative:  d/c today [facility] accepted. PTAR called.nsg report tel#[phone]. No further CM needs.     Final next level of care: [place] Barriers to Discharge: No Barriers Identified   Patient Goals and CMS Choice Patient states their goals for this hospitalization and ongoing recovery are::  (Residential hospice) CMS Medicare.gov Compare Post Acute Care list provided to:: Patient Represenative (must comment) ([place]ans[place](dtr)) Choice offered to / list presented to : Adult Children  Discharge Placement              Patient chooses bed at: Other - please specify in the comment section below: ([place]) Patient to be transferred to facility by:  Corey Harold) Name of family member notified:  [name]) Patient and family notified of of transfer: 03/21/22  Discharge Plan and Services   Discharge Planning Services: CM Consult Post Acute Care Choice: Residential Hospice Bed                               Social Determinants of Health (SDOH) Interventions     Readmission Risk Interventions    03/17/2022   11:34 AM  Readmission Risk Prevention Plan  Transportation Screening Complete  Medication Review ([place]) Complete  PCP or Specialist appointment within 3-5 days of discharge Complete  HRI or [place] Complete  SW Recovery Care/Counseling Consult Complete  [place] Not Applicable

## 2022-03-22 ENCOUNTER — Ambulatory Visit: Payer: Medicare Other

## 2022-03-22 ENCOUNTER — Telehealth: Payer: Self-pay | Admitting: *Deleted

## 2022-03-22 NOTE — Chronic Care Management (AMB) (Signed)
  Care Coordination Note  03/22/2022 Name: Haig Gerardo MRN: [id number] DOB: 09/24/1950  Rondle Lohse is a 71 y.o. year old male who is a primary care patient of [name], [name], NP and is actively engaged with the care management team. I reached out to [name] by phone today to assist with re-scheduling a follow up visit with the RN Case Manager  Follow up plan: Unsuccessful telephone outreach attempt made. A HIPAA compliant phone message was left for the patient providing contact information and requesting a return call.   Julian Hy, [place] Direct Dial: [phone]

## 2022-03-23 ENCOUNTER — Ambulatory Visit: Payer: Medicare Other

## 2022-03-23 NOTE — Chronic Care Management (AMB) (Signed)
Patient is showing you as active in the Care Teams list.  Patient is currently awaiting a bed at [facility] for hospice needs. Patient with full comfort measures.   Natividad Brood, RN BSN CCM   received this from [place].  You don't have to make any further calls to patient. I will follow up to determine if/ when he gets transferred to [facility]

## 2022-03-24 ENCOUNTER — Ambulatory Visit: Payer: Medicare Other

## 2022-03-27 ENCOUNTER — Ambulatory Visit: Payer: Medicare Other

## 2022-03-28 ENCOUNTER — Ambulatory Visit: Payer: Medicare Other

## 2022-03-29 ENCOUNTER — Ambulatory Visit: Payer: Medicare Other

## 2022-03-30 ENCOUNTER — Ambulatory Visit: Payer: Medicare Other

## 2022-03-30 NOTE — Telephone Encounter (Signed)
error 

## 2022-03-31 ENCOUNTER — Ambulatory Visit: Payer: Medicare Other

## 2022-04-01 ENCOUNTER — Other Ambulatory Visit: Payer: Self-pay | Admitting: Nurse Practitioner

## 2022-04-01 DIAGNOSIS — J449 Chronic obstructive pulmonary disease, unspecified: Secondary | ICD-10-CM

## 2022-04-03 ENCOUNTER — Ambulatory Visit: Payer: Medicare Other

## 2022-04-04 ENCOUNTER — Ambulatory Visit: Payer: Medicare Other

## 2022-04-04 ENCOUNTER — Encounter: Payer: Self-pay | Admitting: Internal Medicine

## 2022-04-04 NOTE — Progress Notes (Signed)
  Radiation Oncology         [phone] ________________________________  Name: Jared Stevens MRN: [id number]  Date: 03/16/2022  DOB: 1950-11-03  End of Treatment Note  Diagnosis:  Locally recurrent, Stage IIIa non-small cell lung carcinoma, adenocarcinoma of the RUL     Indication for treatment:  curative       Radiation treatment dates:   02/16/22-03/16/22  Site/dose:   The patient was treated to the disease within the RUL lung initially to a dose of 59.4 Gy using an IMRT technique.    Narrative: The patient tolerated radiation treatment relatively well.   He noted fatigue but no esophagitis or skin changes in the treatment field. On 03/14/22 he was encouraged to go to the ED due to failure to thrive. His clinical course worsened and decisions were made after 03/16/22 to transition to comfort care so his treatment was ended.  Plan: We will follow up as needed with the patient or if there are questions or concerns about his care.     Carola Rhine, PAC

## 2022-04-05 ENCOUNTER — Ambulatory Visit: Payer: Medicare Other

## 2022-04-25 ENCOUNTER — Other Ambulatory Visit: Payer: Self-pay | Admitting: Nurse Practitioner

## 2022-04-25 ENCOUNTER — Other Ambulatory Visit: Payer: Self-pay | Admitting: Cardiovascular Disease

## 2022-04-25 DIAGNOSIS — J449 Chronic obstructive pulmonary disease, unspecified: Secondary | ICD-10-CM

## 2022-04-25 NOTE — Telephone Encounter (Signed)
Chart supports Rx Last OV: 10/2021 Next OV: not scheduled, in hospice

## 2022-05-03 ENCOUNTER — Telehealth: Payer: Self-pay

## 2022-05-29 NOTE — Telephone Encounter (Signed)
Request was made to [facility] (fax:[phone]) for copy of colonoscopy and poathology from procedure around 2018.  Rec'd reply that they have no record of a colonoscopy at their facility

## 2022-05-29 DEATH — deceased

## 2022-06-15 ENCOUNTER — Telehealth: Payer: Self-pay

## 2022-06-15 NOTE — Patient Outreach (Signed)
  Care Coordination   Follow Up Visit Note   06/15/2022 Name: Jared Stevens MRN: [id number] DOB: 1951-02-03  Jared Stevens is a 72 y.o. year old male who sees No primary care provider on file. for primary care.   Patient deceased   Goals Addressed             This Visit's Progress    COMPLETED: Patient Stated: Continue to work with care coordinator to learn skills to manage my health conditions       Deceased Care Coordination Interventions: Evaluation of current treatment plan related to Lung cancer         SDOH assessments and interventions completed:  No     Care Coordination Interventions:  No, not indicated   Follow up plan: No further intervention required.   Encounter Outcome:  Pt. Visit Completed   George Ina RN,BSN,CCM [facility] Care Coordination [phone] direct line

## 2022-07-19 ENCOUNTER — Other Ambulatory Visit: Payer: Self-pay | Admitting: Nurse Practitioner

## 2022-07-19 DIAGNOSIS — J449 Chronic obstructive pulmonary disease, unspecified: Secondary | ICD-10-CM
